# Patient Record
Sex: Male | Born: 1940 | Race: White | Hispanic: No | Marital: Married | Smoking: Never smoker
Health system: Southern US, Community
[De-identification: ages and names within clinical notes are randomized; demographics above are authoritative.]

## PROBLEM LIST (undated history)

## (undated) DIAGNOSIS — I4891 Unspecified atrial fibrillation: Secondary | ICD-10-CM

## (undated) DIAGNOSIS — S46219A Strain of muscle, fascia and tendon of other parts of biceps, unspecified arm, initial encounter: Secondary | ICD-10-CM

## (undated) DIAGNOSIS — I2510 Atherosclerotic heart disease of native coronary artery without angina pectoris: Secondary | ICD-10-CM

## (undated) DIAGNOSIS — F419 Anxiety disorder, unspecified: Secondary | ICD-10-CM

## (undated) DIAGNOSIS — I10 Essential (primary) hypertension: Secondary | ICD-10-CM

## (undated) DIAGNOSIS — Z951 Presence of aortocoronary bypass graft: Secondary | ICD-10-CM

## (undated) HISTORY — DX: Essential (primary) hypertension: I10

## (undated) HISTORY — PX: COLON SURGERY: SHX602

## (undated) HISTORY — DX: Atherosclerotic heart disease of native coronary artery without angina pectoris: I25.10

## (undated) HISTORY — PX: EYE SURGERY: SHX253

## (undated) HISTORY — DX: Anxiety disorder, unspecified: F41.9

## (undated) HISTORY — DX: Presence of aortocoronary bypass graft: Z95.1

---

## 2003-09-11 ENCOUNTER — Emergency Department (HOSPITAL_COMMUNITY): Admission: EM | Admit: 2003-09-11 | Discharge: 2003-09-11 | Payer: Self-pay | Admitting: Family Medicine

## 2004-07-26 ENCOUNTER — Ambulatory Visit (HOSPITAL_COMMUNITY): Admission: RE | Admit: 2004-07-26 | Discharge: 2004-07-27 | Payer: Self-pay | Admitting: Ophthalmology

## 2004-12-27 ENCOUNTER — Ambulatory Visit (HOSPITAL_COMMUNITY): Admission: RE | Admit: 2004-12-27 | Discharge: 2004-12-27 | Payer: Self-pay | Admitting: Gastroenterology

## 2010-08-24 ENCOUNTER — Other Ambulatory Visit: Payer: Self-pay | Admitting: Dermatology

## 2011-03-01 ENCOUNTER — Ambulatory Visit
Admission: RE | Admit: 2011-03-01 | Discharge: 2011-03-01 | Disposition: A | Discharge status: Home / Self Care | Payer: Medicare Other | Source: Ambulatory Visit | Attending: Cardiovascular Disease | Admitting: Cardiovascular Disease

## 2011-03-01 ENCOUNTER — Other Ambulatory Visit: Payer: Self-pay | Admitting: Cardiovascular Disease

## 2011-03-01 DIAGNOSIS — Z01811 Encounter for preprocedural respiratory examination: Secondary | ICD-10-CM

## 2011-03-02 ENCOUNTER — Ambulatory Visit (HOSPITAL_COMMUNITY)
Admission: RE | Admit: 2011-03-02 | Discharge: 2011-03-03 | Disposition: A | Discharge status: Home / Self Care | Payer: Medicare Other | Source: Ambulatory Visit | Attending: Cardiovascular Disease | Admitting: Cardiovascular Disease

## 2011-03-02 ENCOUNTER — Encounter (HOSPITAL_COMMUNITY): Payer: Medicare Other

## 2011-03-02 HISTORY — PX: CARDIAC CATHETERIZATION: SHX172

## 2011-03-02 LAB — CBC
HCT: 42.7 % (ref 39.0–52.0)
Hemoglobin: 15 g/dL (ref 13.0–17.0)
MCH: 30.2 pg (ref 26.0–34.0)
MCHC: 35.1 g/dL (ref 30.0–36.0)
RBC: 4.96 MIL/uL (ref 4.22–5.81)

## 2011-03-03 ENCOUNTER — Ambulatory Visit (HOSPITAL_COMMUNITY): Payer: Medicare Other

## 2011-03-03 ENCOUNTER — Other Ambulatory Visit (HOSPITAL_COMMUNITY): Payer: Medicare Other

## 2011-03-03 DIAGNOSIS — Z01810 Encounter for preprocedural cardiovascular examination: Secondary | ICD-10-CM

## 2011-03-03 DIAGNOSIS — I2510 Atherosclerotic heart disease of native coronary artery without angina pectoris: Secondary | ICD-10-CM

## 2011-03-03 HISTORY — PX: OTHER SURGICAL HISTORY: SHX169

## 2011-03-03 LAB — BLOOD GAS, ARTERIAL
Acid-Base Excess: 1.5 mmol/L (ref 0.0–2.0)
Bicarbonate: 25.1 mEq/L — ABNORMAL HIGH (ref 20.0–24.0)
Drawn by: 344381
FIO2: 0.21 %
O2 Saturation: 97.8 %
Patient temperature: 98.6
TCO2: 26.2 mmol/L (ref 0–100)
pCO2 arterial: 36.6 mmHg (ref 35.0–45.0)
pH, Arterial: 7.451 — ABNORMAL HIGH (ref 7.350–7.450)
pO2, Arterial: 90.4 mmHg (ref 80.0–100.0)

## 2011-03-03 LAB — TYPE AND SCREEN
ABO/RH(D): A POS
Antibody Screen: NEGATIVE

## 2011-03-03 LAB — SURGICAL PCR SCREEN
MRSA, PCR: NEGATIVE
Staphylococcus aureus: NEGATIVE

## 2011-03-03 LAB — COMPREHENSIVE METABOLIC PANEL
ALT: 21 U/L (ref 0–53)
AST: 18 U/L (ref 0–37)
Albumin: 3.5 g/dL (ref 3.5–5.2)
Alkaline Phosphatase: 45 U/L (ref 39–117)
BUN: 14 mg/dL (ref 6–23)
CO2: 24 mEq/L (ref 19–32)
Calcium: 9.2 mg/dL (ref 8.4–10.5)
Chloride: 102 mEq/L (ref 96–112)
Creatinine, Ser: 0.86 mg/dL (ref 0.50–1.35)
GFR calc Af Amer: >60 mL/min (ref >60)
GFR calc non Af Amer: >60 mL/min (ref >60)
Glucose, Bld: 152 mg/dL — ABNORMAL HIGH (ref 70–99)
Potassium: 3.9 mEq/L (ref 3.5–5.1)
Sodium: 136 mEq/L (ref 135–145)
Total Bilirubin: 0.7 mg/dL (ref 0.3–1.2)
Total Protein: 6.6 g/dL (ref 6.0–8.3)

## 2011-03-03 LAB — URINALYSIS, ROUTINE W REFLEX MICROSCOPIC
Bilirubin Urine: NEGATIVE
Glucose, UA: NEGATIVE mg/dL
Hgb urine dipstick: NEGATIVE
Ketones, ur: NEGATIVE mg/dL
Leukocytes, UA: NEGATIVE
Nitrite: NEGATIVE
Protein, ur: NEGATIVE mg/dL
Specific Gravity, Urine: 1.02 (ref 1.005–1.030)
Urobilinogen, UA: 0.2 mg/dL (ref 0.0–1.0)
pH: 5.5 (ref 5.0–8.0)

## 2011-03-03 LAB — ABO/RH: ABO/RH(D): A POS

## 2011-03-03 LAB — APTT: aPTT: 33 seconds (ref 24–37)

## 2011-03-03 LAB — PROTIME-INR
INR: 1.09 (ref 0.00–1.49)
Prothrombin Time: 14.3 seconds (ref 11.6–15.2)

## 2011-03-04 LAB — HEMOGLOBIN A1C
Hgb A1c MFr Bld: 6.3 % — ABNORMAL HIGH (ref <5.7)
Mean Plasma Glucose: 134 mg/dL — ABNORMAL HIGH (ref <117)

## 2011-03-07 ENCOUNTER — Inpatient Hospital Stay (HOSPITAL_COMMUNITY)
Admission: RE | Admit: 2011-03-07 | Discharge: 2011-03-16 | DRG: 236 | Disposition: A | Discharge status: Home Health | Payer: Medicare Other | Source: Ambulatory Visit | Attending: Cardiothoracic Surgery | Admitting: Cardiothoracic Surgery

## 2011-03-07 ENCOUNTER — Inpatient Hospital Stay (HOSPITAL_COMMUNITY): Payer: Medicare Other

## 2011-03-07 DIAGNOSIS — I10 Essential (primary) hypertension: Secondary | ICD-10-CM | POA: Diagnosis present

## 2011-03-07 DIAGNOSIS — Y832 Surgical operation with anastomosis, bypass or graft as the cause of abnormal reaction of the patient, or of later complication, without mention of misadventure at the time of the procedure: Secondary | ICD-10-CM | POA: Diagnosis not present

## 2011-03-07 DIAGNOSIS — I4891 Unspecified atrial fibrillation: Secondary | ICD-10-CM | POA: Diagnosis not present

## 2011-03-07 DIAGNOSIS — D62 Acute posthemorrhagic anemia: Secondary | ICD-10-CM | POA: Diagnosis not present

## 2011-03-07 DIAGNOSIS — R110 Nausea: Secondary | ICD-10-CM | POA: Diagnosis not present

## 2011-03-07 DIAGNOSIS — I2510 Atherosclerotic heart disease of native coronary artery without angina pectoris: Principal | ICD-10-CM | POA: Diagnosis present

## 2011-03-07 DIAGNOSIS — D126 Benign neoplasm of colon, unspecified: Secondary | ICD-10-CM | POA: Diagnosis present

## 2011-03-07 DIAGNOSIS — Z79899 Other long term (current) drug therapy: Secondary | ICD-10-CM

## 2011-03-07 DIAGNOSIS — Z7901 Long term (current) use of anticoagulants: Secondary | ICD-10-CM

## 2011-03-07 DIAGNOSIS — Z8249 Family history of ischemic heart disease and other diseases of the circulatory system: Secondary | ICD-10-CM

## 2011-03-07 DIAGNOSIS — I519 Heart disease, unspecified: Secondary | ICD-10-CM | POA: Diagnosis not present

## 2011-03-07 DIAGNOSIS — I2582 Chronic total occlusion of coronary artery: Secondary | ICD-10-CM | POA: Diagnosis present

## 2011-03-07 DIAGNOSIS — I209 Angina pectoris, unspecified: Secondary | ICD-10-CM | POA: Diagnosis present

## 2011-03-07 DIAGNOSIS — E669 Obesity, unspecified: Secondary | ICD-10-CM | POA: Diagnosis present

## 2011-03-07 DIAGNOSIS — E8779 Other fluid overload: Secondary | ICD-10-CM | POA: Diagnosis not present

## 2011-03-07 DIAGNOSIS — F411 Generalized anxiety disorder: Secondary | ICD-10-CM | POA: Diagnosis present

## 2011-03-07 DIAGNOSIS — S7010XA Contusion of unspecified thigh, initial encounter: Secondary | ICD-10-CM | POA: Diagnosis not present

## 2011-03-07 DIAGNOSIS — M109 Gout, unspecified: Secondary | ICD-10-CM | POA: Diagnosis present

## 2011-03-07 DIAGNOSIS — T462X5A Adverse effect of other antidysrhythmic drugs, initial encounter: Secondary | ICD-10-CM | POA: Diagnosis not present

## 2011-03-07 DIAGNOSIS — D72829 Elevated white blood cell count, unspecified: Secondary | ICD-10-CM | POA: Diagnosis not present

## 2011-03-07 DIAGNOSIS — E876 Hypokalemia: Secondary | ICD-10-CM | POA: Diagnosis not present

## 2011-03-07 DIAGNOSIS — Z7982 Long term (current) use of aspirin: Secondary | ICD-10-CM

## 2011-03-07 DIAGNOSIS — Z951 Presence of aortocoronary bypass graft: Secondary | ICD-10-CM

## 2011-03-07 DIAGNOSIS — X58XXXA Exposure to other specified factors, initial encounter: Secondary | ICD-10-CM | POA: Diagnosis not present

## 2011-03-07 DIAGNOSIS — E871 Hypo-osmolality and hyponatremia: Secondary | ICD-10-CM | POA: Diagnosis not present

## 2011-03-07 DIAGNOSIS — Y9210 Unspecified residential institution as the place of occurrence of the external cause: Secondary | ICD-10-CM | POA: Diagnosis not present

## 2011-03-07 HISTORY — DX: Presence of aortocoronary bypass graft: Z95.1

## 2011-03-07 HISTORY — PX: CORONARY ARTERY BYPASS GRAFT: SHX141

## 2011-03-07 LAB — POCT I-STAT 3, VENOUS BLOOD GAS (G3P V)
Bicarbonate: 24.1 mEq/L — ABNORMAL HIGH (ref 20.0–24.0)
O2 Saturation: 84 %
TCO2: 25 mmol/L (ref 0–100)
pCO2, Ven: 37.8 mmHg — ABNORMAL LOW (ref 45.0–50.0)
pH, Ven: 7.413 — ABNORMAL HIGH (ref 7.250–7.300)
pO2, Ven: 48 mmHg — ABNORMAL HIGH (ref 30.0–45.0)

## 2011-03-07 LAB — POCT I-STAT 4, (NA,K, GLUC, HGB,HCT)
Glucose, Bld: 110 mg/dL — ABNORMAL HIGH (ref 70–99)
Glucose, Bld: 142 mg/dL — ABNORMAL HIGH (ref 70–99)
Glucose, Bld: 118 mg/dL — ABNORMAL HIGH (ref 70–99)
Glucose, Bld: 129 mg/dL — ABNORMAL HIGH (ref 70–99)
Glucose, Bld: 130 mg/dL — ABNORMAL HIGH (ref 70–99)
Glucose, Bld: 138 mg/dL — ABNORMAL HIGH (ref 70–99)
Glucose, Bld: 118 mg/dL — ABNORMAL HIGH (ref 70–99)
HCT: 39 % (ref 39.0–52.0)
HCT: 39 % (ref 39.0–52.0)
HCT: 30 % — ABNORMAL LOW (ref 39.0–52.0)
HCT: 28 % — ABNORMAL LOW (ref 39.0–52.0)
HCT: 32 % — ABNORMAL LOW (ref 39.0–52.0)
HCT: 29 % — ABNORMAL LOW (ref 39.0–52.0)
HCT: 31 % — ABNORMAL LOW (ref 39.0–52.0)
Hemoglobin: 13.3 g/dL (ref 13.0–17.0)
Hemoglobin: 13.3 g/dL (ref 13.0–17.0)
Hemoglobin: 10.2 g/dL — ABNORMAL LOW (ref 13.0–17.0)
Hemoglobin: 10.9 g/dL — ABNORMAL LOW (ref 13.0–17.0)
Hemoglobin: 9.5 g/dL — ABNORMAL LOW (ref 13.0–17.0)
Hemoglobin: 9.9 g/dL — ABNORMAL LOW (ref 13.0–17.0)
Hemoglobin: 10.5 g/dL — ABNORMAL LOW (ref 13.0–17.0)
Potassium: 3.4 mEq/L — ABNORMAL LOW (ref 3.5–5.1)
Potassium: 3.3 mEq/L — ABNORMAL LOW (ref 3.5–5.1)
Potassium: 3.3 mEq/L — ABNORMAL LOW (ref 3.5–5.1)
Potassium: 4.1 mEq/L (ref 3.5–5.1)
Potassium: 4.3 mEq/L (ref 3.5–5.1)
Potassium: 3.6 mEq/L (ref 3.5–5.1)
Potassium: 3.3 mEq/L — ABNORMAL LOW (ref 3.5–5.1)
Sodium: 140 mEq/L (ref 135–145)
Sodium: 140 mEq/L (ref 135–145)
Sodium: 135 mEq/L (ref 135–145)
Sodium: 138 mEq/L (ref 135–145)
Sodium: 139 mEq/L (ref 135–145)
Sodium: 137 mEq/L (ref 135–145)
Sodium: 141 mEq/L (ref 135–145)

## 2011-03-07 LAB — POCT I-STAT 3, ART BLOOD GAS (G3+)
Acid-Base Excess: 4 mmol/L — ABNORMAL HIGH (ref 0.0–2.0)
Acid-Base Excess: 2 mmol/L (ref 0.0–2.0)
Acid-Base Excess: 1 mmol/L (ref 0.0–2.0)
Acid-base deficit: 1 mmol/L (ref 0.0–2.0)
Bicarbonate: 28.5 mEq/L — ABNORMAL HIGH (ref 20.0–24.0)
Bicarbonate: 25.8 mEq/L — ABNORMAL HIGH (ref 20.0–24.0)
Bicarbonate: 25 mEq/L — ABNORMAL HIGH (ref 20.0–24.0)
Bicarbonate: 23.5 mEq/L (ref 20.0–24.0)
O2 Saturation: 100 %
O2 Saturation: 100 %
O2 Saturation: 100 %
O2 Saturation: 96 %
Patient temperature: 35.5
TCO2: 30 mmol/L (ref 0–100)
TCO2: 27 mmol/L (ref 0–100)
TCO2: 26 mmol/L (ref 0–100)
TCO2: 25 mmol/L (ref 0–100)
pCO2 arterial: 39.4 mmHg (ref 35.0–45.0)
pCO2 arterial: 36.1 mmHg (ref 35.0–45.0)
pCO2 arterial: 37.9 mmHg (ref 35.0–45.0)
pCO2 arterial: 34.7 mmHg — ABNORMAL LOW (ref 35.0–45.0)
pH, Arterial: 7.468 — ABNORMAL HIGH (ref 7.350–7.450)
pH, Arterial: 7.461 — ABNORMAL HIGH (ref 7.350–7.450)
pH, Arterial: 7.427 (ref 7.350–7.450)
pH, Arterial: 7.432 (ref 7.350–7.450)
pO2, Arterial: 479 mmHg — ABNORMAL HIGH (ref 80.0–100.0)
pO2, Arterial: 395 mmHg — ABNORMAL HIGH (ref 80.0–100.0)
pO2, Arterial: 297 mmHg — ABNORMAL HIGH (ref 80.0–100.0)
pO2, Arterial: 71 mmHg — ABNORMAL LOW (ref 80.0–100.0)

## 2011-03-07 LAB — CBC
HCT: 29.4 % — ABNORMAL LOW (ref 39.0–52.0)
HCT: 31.2 % — ABNORMAL LOW (ref 39.0–52.0)
Hemoglobin: 10 g/dL — ABNORMAL LOW (ref 13.0–17.0)
Hemoglobin: 10.7 g/dL — ABNORMAL LOW (ref 13.0–17.0)
MCH: 29.2 pg (ref 26.0–34.0)
MCH: 29.5 pg (ref 26.0–34.0)
MCHC: 34 g/dL (ref 30.0–36.0)
MCHC: 34.3 g/dL (ref 30.0–36.0)
MCV: 86 fL (ref 78.0–100.0)
MCV: 86 fL (ref 78.0–100.0)
Platelets: 141 10*3/uL — ABNORMAL LOW (ref 150–400)
Platelets: 147 10*3/uL — ABNORMAL LOW (ref 150–400)
RBC: 3.42 MIL/uL — ABNORMAL LOW (ref 4.22–5.81)
RBC: 3.63 MIL/uL — ABNORMAL LOW (ref 4.22–5.81)
RDW: 13.4 % (ref 11.5–15.5)
RDW: 13.5 % (ref 11.5–15.5)
WBC: 16.3 10*3/uL — ABNORMAL HIGH (ref 4.0–10.5)
WBC: 14.7 10*3/uL — ABNORMAL HIGH (ref 4.0–10.5)

## 2011-03-07 LAB — PROTIME-INR
INR: 1.64 — ABNORMAL HIGH (ref 0.00–1.49)
Prothrombin Time: 19.7 seconds — ABNORMAL HIGH (ref 11.6–15.2)

## 2011-03-07 LAB — GLUCOSE, CAPILLARY
Glucose-Capillary: 123 mg/dL — ABNORMAL HIGH (ref 70–99)
Glucose-Capillary: 127 mg/dL — ABNORMAL HIGH (ref 70–99)
Glucose-Capillary: 128 mg/dL — ABNORMAL HIGH (ref 70–99)
Glucose-Capillary: 130 mg/dL — ABNORMAL HIGH (ref 70–99)
Glucose-Capillary: 136 mg/dL — ABNORMAL HIGH (ref 70–99)
Glucose-Capillary: 136 mg/dL — ABNORMAL HIGH (ref 70–99)
Glucose-Capillary: 137 mg/dL — ABNORMAL HIGH (ref 70–99)
Glucose-Capillary: 139 mg/dL — ABNORMAL HIGH (ref 70–99)
Glucose-Capillary: 140 mg/dL — ABNORMAL HIGH (ref 70–99)
Glucose-Capillary: 151 mg/dL — ABNORMAL HIGH (ref 70–99)

## 2011-03-07 LAB — APTT: aPTT: 37 seconds (ref 24–37)

## 2011-03-07 LAB — POCT I-STAT GLUCOSE
Glucose, Bld: 139 mg/dL — ABNORMAL HIGH (ref 70–99)
Glucose, Bld: 123 mg/dL — ABNORMAL HIGH (ref 70–99)
Operator id: 212621
Operator id: 3342

## 2011-03-07 LAB — HEMOGLOBIN AND HEMATOCRIT, BLOOD
HCT: 30.9 % — ABNORMAL LOW (ref 39.0–52.0)
Hemoglobin: 10.6 g/dL — ABNORMAL LOW (ref 13.0–17.0)

## 2011-03-07 LAB — MAGNESIUM: Magnesium: 2.9 mg/dL — ABNORMAL HIGH (ref 1.5–2.5)

## 2011-03-07 LAB — PLATELET COUNT: Platelets: 172 10*3/uL (ref 150–400)

## 2011-03-07 LAB — CREATININE, SERUM
Creatinine, Ser: 0.71 mg/dL (ref 0.50–1.35)
GFR calc Af Amer: >60 mL/min (ref >60)
GFR calc non Af Amer: >60 mL/min (ref >60)

## 2011-03-08 ENCOUNTER — Inpatient Hospital Stay (HOSPITAL_COMMUNITY): Payer: Medicare Other

## 2011-03-08 LAB — POCT I-STAT, CHEM 8
BUN: 9 mg/dL (ref 6–23)
BUN: 14 mg/dL (ref 6–23)
Calcium, Ion: 1.16 mmol/L (ref 1.12–1.32)
Chloride: 103 mEq/L (ref 96–112)
Creatinine, Ser: 1 mg/dL (ref 0.50–1.35)
Glucose, Bld: 147 mg/dL — ABNORMAL HIGH (ref 70–99)
HCT: 31 % — ABNORMAL LOW (ref 39.0–52.0)
Hemoglobin: 10.2 g/dL — ABNORMAL LOW (ref 13.0–17.0)
Hemoglobin: 10.5 g/dL — ABNORMAL LOW (ref 13.0–17.0)
Potassium: 3.5 mEq/L (ref 3.5–5.1)
Sodium: 141 mEq/L (ref 135–145)
Sodium: 139 mEq/L (ref 135–145)
TCO2: 23 mmol/L (ref 0–100)
TCO2: 23 mmol/L (ref 0–100)

## 2011-03-08 LAB — CREATININE, SERUM
Creatinine, Ser: 1.03 mg/dL (ref 0.50–1.35)
GFR calc Af Amer: >60 mL/min (ref >60)
GFR calc non Af Amer: >60 mL/min (ref >60)

## 2011-03-08 LAB — CBC
HCT: 29.6 % — ABNORMAL LOW (ref 39.0–52.0)
HCT: 31 % — ABNORMAL LOW (ref 39.0–52.0)
Hemoglobin: 10.1 g/dL — ABNORMAL LOW (ref 13.0–17.0)
Hemoglobin: 10.4 g/dL — ABNORMAL LOW (ref 13.0–17.0)
MCH: 29.4 pg (ref 26.0–34.0)
MCH: 29.4 pg (ref 26.0–34.0)
MCHC: 34.1 g/dL (ref 30.0–36.0)
MCHC: 33.5 g/dL (ref 30.0–36.0)
MCV: 86.3 fL (ref 78.0–100.0)
MCV: 87.6 fL (ref 78.0–100.0)
Platelets: 143 10*3/uL — ABNORMAL LOW (ref 150–400)
Platelets: 171 10*3/uL (ref 150–400)
RBC: 3.43 MIL/uL — ABNORMAL LOW (ref 4.22–5.81)
RBC: 3.54 MIL/uL — ABNORMAL LOW (ref 4.22–5.81)
RDW: 13.6 % (ref 11.5–15.5)
RDW: 14 % (ref 11.5–15.5)
WBC: 13.7 10*3/uL — ABNORMAL HIGH (ref 4.0–10.5)
WBC: 20.5 10*3/uL — ABNORMAL HIGH (ref 4.0–10.5)

## 2011-03-08 LAB — POCT I-STAT 3, ART BLOOD GAS (G3+)
Bicarbonate: 23.5 mEq/L (ref 20.0–24.0)
Patient temperature: 97.7
TCO2: 25 mmol/L (ref 0–100)
pCO2 arterial: 38.2 mmHg (ref 35.0–45.0)
pH, Arterial: 7.399 (ref 7.350–7.450)
pH, Arterial: 7.371 (ref 7.350–7.450)

## 2011-03-08 LAB — GLUCOSE, CAPILLARY
Glucose-Capillary: 110 mg/dL — ABNORMAL HIGH (ref 70–99)
Glucose-Capillary: 103 mg/dL — ABNORMAL HIGH (ref 70–99)
Glucose-Capillary: 101 mg/dL — ABNORMAL HIGH (ref 70–99)
Glucose-Capillary: 105 mg/dL — ABNORMAL HIGH (ref 70–99)
Glucose-Capillary: 100 mg/dL — ABNORMAL HIGH (ref 70–99)
Glucose-Capillary: 94 mg/dL (ref 70–99)
Glucose-Capillary: 93 mg/dL (ref 70–99)
Glucose-Capillary: 131 mg/dL — ABNORMAL HIGH (ref 70–99)
Glucose-Capillary: 154 mg/dL — ABNORMAL HIGH (ref 70–99)
Glucose-Capillary: 194 mg/dL — ABNORMAL HIGH (ref 70–99)

## 2011-03-08 LAB — BASIC METABOLIC PANEL
BUN: 11 mg/dL (ref 6–23)
CO2: 25 mEq/L (ref 19–32)
Calcium: 7.7 mg/dL — ABNORMAL LOW (ref 8.4–10.5)
Chloride: 109 mEq/L (ref 96–112)
Creatinine, Ser: 0.68 mg/dL (ref 0.50–1.35)
GFR calc Af Amer: >60 mL/min (ref >60)
GFR calc non Af Amer: >60 mL/min (ref >60)
Glucose, Bld: 104 mg/dL — ABNORMAL HIGH (ref 70–99)
Potassium: 3.4 mEq/L — ABNORMAL LOW (ref 3.5–5.1)
Sodium: 140 mEq/L (ref 135–145)

## 2011-03-08 LAB — MAGNESIUM
Magnesium: 2.6 mg/dL — ABNORMAL HIGH (ref 1.5–2.5)
Magnesium: 2.5 mg/dL (ref 1.5–2.5)

## 2011-03-09 ENCOUNTER — Inpatient Hospital Stay (HOSPITAL_COMMUNITY): Payer: Medicare Other

## 2011-03-09 DIAGNOSIS — E1165 Type 2 diabetes mellitus with hyperglycemia: Secondary | ICD-10-CM

## 2011-03-09 DIAGNOSIS — IMO0001 Reserved for inherently not codable concepts without codable children: Secondary | ICD-10-CM

## 2011-03-09 LAB — CBC
HCT: 30.4 % — ABNORMAL LOW (ref 39.0–52.0)
Hemoglobin: 10.4 g/dL — ABNORMAL LOW (ref 13.0–17.0)
MCH: 30 pg (ref 26.0–34.0)
MCHC: 34.2 g/dL (ref 30.0–36.0)
MCV: 87.6 fL (ref 78.0–100.0)
Platelets: 179 10*3/uL (ref 150–400)
RBC: 3.47 MIL/uL — ABNORMAL LOW (ref 4.22–5.81)
RDW: 14.3 % (ref 11.5–15.5)
WBC: 21.8 10*3/uL — ABNORMAL HIGH (ref 4.0–10.5)

## 2011-03-09 LAB — ALBUMIN: Albumin: 2.5 g/dL — ABNORMAL LOW (ref 3.5–5.2)

## 2011-03-09 LAB — BASIC METABOLIC PANEL
BUN: 19 mg/dL (ref 6–23)
CO2: 23 mEq/L (ref 19–32)
Calcium: 7.9 mg/dL — ABNORMAL LOW (ref 8.4–10.5)
Chloride: 102 mEq/L (ref 96–112)
Creatinine, Ser: 1.15 mg/dL (ref 0.50–1.35)
GFR calc Af Amer: >60 mL/min (ref >60)
GFR calc non Af Amer: >60 mL/min (ref >60)
Glucose, Bld: 208 mg/dL — ABNORMAL HIGH (ref 70–99)
Potassium: 3.7 mEq/L (ref 3.5–5.1)
Sodium: 134 mEq/L — ABNORMAL LOW (ref 135–145)

## 2011-03-09 LAB — URINE MICROSCOPIC-ADD ON

## 2011-03-09 LAB — URINALYSIS, ROUTINE W REFLEX MICROSCOPIC
Glucose, UA: NEGATIVE mg/dL
Hgb urine dipstick: NEGATIVE
Ketones, ur: 15 mg/dL — AB
Nitrite: POSITIVE — AB
Protein, ur: NEGATIVE mg/dL
Specific Gravity, Urine: 1.03 (ref 1.005–1.030)
Urobilinogen, UA: 1 mg/dL (ref 0.0–1.0)
pH: 5.5 (ref 5.0–8.0)

## 2011-03-09 LAB — GLUCOSE, CAPILLARY
Glucose-Capillary: 121 mg/dL — ABNORMAL HIGH (ref 70–99)
Glucose-Capillary: 139 mg/dL — ABNORMAL HIGH (ref 70–99)
Glucose-Capillary: 158 mg/dL — ABNORMAL HIGH (ref 70–99)
Glucose-Capillary: 176 mg/dL — ABNORMAL HIGH (ref 70–99)
Glucose-Capillary: 183 mg/dL — ABNORMAL HIGH (ref 70–99)
Glucose-Capillary: 193 mg/dL — ABNORMAL HIGH (ref 70–99)

## 2011-03-09 LAB — PHOSPHORUS: Phosphorus: 2.5 mg/dL (ref 2.3–4.6)

## 2011-03-09 LAB — MAGNESIUM: Magnesium: 2.4 mg/dL (ref 1.5–2.5)

## 2011-03-10 ENCOUNTER — Inpatient Hospital Stay (HOSPITAL_COMMUNITY): Payer: Medicare Other

## 2011-03-10 LAB — URINE CULTURE
Colony Count: NO GROWTH
Culture  Setup Time: 201209061130
Culture: NO GROWTH

## 2011-03-10 LAB — BASIC METABOLIC PANEL
BUN: 29 mg/dL — ABNORMAL HIGH (ref 6–23)
CO2: 25 mEq/L (ref 19–32)
Calcium: 8.3 mg/dL — ABNORMAL LOW (ref 8.4–10.5)
Chloride: 96 mEq/L (ref 96–112)
Creatinine, Ser: 1.32 mg/dL (ref 0.50–1.35)
GFR calc Af Amer: >60 mL/min (ref >60)
GFR calc non Af Amer: 53 mL/min — ABNORMAL LOW (ref >60)
Glucose, Bld: 147 mg/dL — ABNORMAL HIGH (ref 70–99)
Potassium: 4.2 mEq/L (ref 3.5–5.1)
Sodium: 128 mEq/L — ABNORMAL LOW (ref 135–145)

## 2011-03-10 LAB — CBC
HCT: 31.4 % — ABNORMAL LOW (ref 39.0–52.0)
Hemoglobin: 10.6 g/dL — ABNORMAL LOW (ref 13.0–17.0)
MCH: 29.3 pg (ref 26.0–34.0)
MCHC: 33.8 g/dL (ref 30.0–36.0)
MCV: 86.7 fL (ref 78.0–100.0)
Platelets: 179 10*3/uL (ref 150–400)
RBC: 3.62 MIL/uL — ABNORMAL LOW (ref 4.22–5.81)
RDW: 14.1 % (ref 11.5–15.5)
WBC: 18.5 10*3/uL — ABNORMAL HIGH (ref 4.0–10.5)

## 2011-03-10 LAB — PROTIME-INR
INR: 1.17 (ref 0.00–1.49)
Prothrombin Time: 15.1 seconds (ref 11.6–15.2)

## 2011-03-10 LAB — GLUCOSE, CAPILLARY
Glucose-Capillary: 127 mg/dL — ABNORMAL HIGH (ref 70–99)
Glucose-Capillary: 137 mg/dL — ABNORMAL HIGH (ref 70–99)

## 2011-03-11 LAB — GLUCOSE, CAPILLARY
Glucose-Capillary: 131 mg/dL — ABNORMAL HIGH (ref 70–99)
Glucose-Capillary: 139 mg/dL — ABNORMAL HIGH (ref 70–99)
Glucose-Capillary: 115 mg/dL — ABNORMAL HIGH (ref 70–99)
Glucose-Capillary: 115 mg/dL — ABNORMAL HIGH (ref 70–99)
Glucose-Capillary: 139 mg/dL — ABNORMAL HIGH (ref 70–99)
Glucose-Capillary: 118 mg/dL — ABNORMAL HIGH (ref 70–99)

## 2011-03-11 LAB — CBC
HCT: 34 % — ABNORMAL LOW (ref 39.0–52.0)
Hemoglobin: 11.5 g/dL — ABNORMAL LOW (ref 13.0–17.0)
MCH: 29.5 pg (ref 26.0–34.0)
MCHC: 33.8 g/dL (ref 30.0–36.0)
MCV: 87.2 fL (ref 78.0–100.0)
Platelets: 233 10*3/uL (ref 150–400)
RBC: 3.9 MIL/uL — ABNORMAL LOW (ref 4.22–5.81)
RDW: 14.2 % (ref 11.5–15.5)
WBC: 16.1 10*3/uL — ABNORMAL HIGH (ref 4.0–10.5)

## 2011-03-11 LAB — HEPARIN LEVEL (UNFRACTIONATED): Heparin Unfractionated: 0.16 IU/mL — ABNORMAL LOW (ref 0.30–0.70)

## 2011-03-11 LAB — PROTIME-INR
INR: 1.12 (ref 0.00–1.49)
Prothrombin Time: 14.6 seconds (ref 11.6–15.2)

## 2011-03-12 LAB — CBC
HCT: 28.2 % — ABNORMAL LOW (ref 39.0–52.0)
Hemoglobin: 9.6 g/dL — ABNORMAL LOW (ref 13.0–17.0)
MCH: 29.4 pg (ref 26.0–34.0)
MCHC: 34 g/dL (ref 30.0–36.0)
MCV: 86.5 fL (ref 78.0–100.0)
Platelets: 222 10*3/uL (ref 150–400)
RBC: 3.26 MIL/uL — ABNORMAL LOW (ref 4.22–5.81)
RDW: 14.1 % (ref 11.5–15.5)
WBC: 15.2 10*3/uL — ABNORMAL HIGH (ref 4.0–10.5)

## 2011-03-12 LAB — PROTIME-INR
INR: 1.21 (ref 0.00–1.49)
Prothrombin Time: 15.6 seconds — ABNORMAL HIGH (ref 11.6–15.2)

## 2011-03-12 LAB — GLUCOSE, CAPILLARY
Glucose-Capillary: 120 mg/dL — ABNORMAL HIGH (ref 70–99)
Glucose-Capillary: 109 mg/dL — ABNORMAL HIGH (ref 70–99)
Glucose-Capillary: 115 mg/dL — ABNORMAL HIGH (ref 70–99)

## 2011-03-12 LAB — HEPARIN LEVEL (UNFRACTIONATED): Heparin Unfractionated: 0.37 IU/mL (ref 0.30–0.70)

## 2011-03-13 LAB — BASIC METABOLIC PANEL
BUN: 39 mg/dL — ABNORMAL HIGH (ref 6–23)
CO2: 24 mEq/L (ref 19–32)
Calcium: 8.5 mg/dL (ref 8.4–10.5)
Chloride: 104 mEq/L (ref 96–112)
Creatinine, Ser: 1.37 mg/dL — ABNORMAL HIGH (ref 0.50–1.35)
GFR calc Af Amer: >60 mL/min (ref >60)
GFR calc non Af Amer: 51 mL/min — ABNORMAL LOW (ref >60)
Glucose, Bld: 123 mg/dL — ABNORMAL HIGH (ref 70–99)
Potassium: 4.9 mEq/L (ref 3.5–5.1)
Sodium: 137 mEq/L (ref 135–145)

## 2011-03-13 LAB — GLUCOSE, CAPILLARY
Glucose-Capillary: 122 mg/dL — ABNORMAL HIGH (ref 70–99)
Glucose-Capillary: 105 mg/dL — ABNORMAL HIGH (ref 70–99)
Glucose-Capillary: 143 mg/dL — ABNORMAL HIGH (ref 70–99)
Glucose-Capillary: 105 mg/dL — ABNORMAL HIGH (ref 70–99)
Glucose-Capillary: 118 mg/dL — ABNORMAL HIGH (ref 70–99)

## 2011-03-13 LAB — PROTIME-INR
INR: 1.22 (ref 0.00–1.49)
Prothrombin Time: 15.7 seconds — ABNORMAL HIGH (ref 11.6–15.2)

## 2011-03-13 LAB — HEPARIN LEVEL (UNFRACTIONATED): Heparin Unfractionated: 0.44 IU/mL (ref 0.30–0.70)

## 2011-03-14 ENCOUNTER — Inpatient Hospital Stay (HOSPITAL_COMMUNITY): Payer: Medicare Other

## 2011-03-14 LAB — GLUCOSE, CAPILLARY
Glucose-Capillary: 98 mg/dL (ref 70–99)
Glucose-Capillary: 98 mg/dL (ref 70–99)
Glucose-Capillary: 133 mg/dL — ABNORMAL HIGH (ref 70–99)
Glucose-Capillary: 103 mg/dL — ABNORMAL HIGH (ref 70–99)

## 2011-03-14 LAB — APTT: aPTT: 80 seconds — ABNORMAL HIGH (ref 24–37)

## 2011-03-14 LAB — CBC
HCT: 28.3 % — ABNORMAL LOW (ref 39.0–52.0)
Hemoglobin: 9.4 g/dL — ABNORMAL LOW (ref 13.0–17.0)
MCH: 29.5 pg (ref 26.0–34.0)
MCHC: 33.2 g/dL (ref 30.0–36.0)
MCV: 88.7 fL (ref 78.0–100.0)
Platelets: 270 10*3/uL (ref 150–400)
RBC: 3.19 MIL/uL — ABNORMAL LOW (ref 4.22–5.81)
RDW: 14.7 % (ref 11.5–15.5)
WBC: 15.7 10*3/uL — ABNORMAL HIGH (ref 4.0–10.5)

## 2011-03-14 LAB — HEPARIN LEVEL (UNFRACTIONATED): Heparin Unfractionated: 0.49 IU/mL (ref 0.30–0.70)

## 2011-03-14 LAB — PROTIME-INR: INR: 1.26 (ref 0.00–1.49)

## 2011-03-15 LAB — CBC
HCT: 27.9 % — ABNORMAL LOW (ref 39.0–52.0)
Hemoglobin: 9.1 g/dL — ABNORMAL LOW (ref 13.0–17.0)
MCH: 29.2 pg (ref 26.0–34.0)
MCHC: 32.6 g/dL (ref 30.0–36.0)
MCV: 89.4 fL (ref 78.0–100.0)
Platelets: 271 10*3/uL (ref 150–400)
RBC: 3.12 MIL/uL — ABNORMAL LOW (ref 4.22–5.81)
RDW: 15.1 % (ref 11.5–15.5)
WBC: 13.3 10*3/uL — ABNORMAL HIGH (ref 4.0–10.5)

## 2011-03-15 LAB — GLUCOSE, CAPILLARY
Glucose-Capillary: 103 mg/dL — ABNORMAL HIGH (ref 70–99)
Glucose-Capillary: 124 mg/dL — ABNORMAL HIGH (ref 70–99)
Glucose-Capillary: 137 mg/dL — ABNORMAL HIGH (ref 70–99)
Glucose-Capillary: 110 mg/dL — ABNORMAL HIGH (ref 70–99)

## 2011-03-15 LAB — PROTIME-INR
INR: 1.34 (ref 0.00–1.49)
Prothrombin Time: 16.8 seconds — ABNORMAL HIGH (ref 11.6–15.2)

## 2011-03-15 LAB — HEPARIN LEVEL (UNFRACTIONATED): Heparin Unfractionated: 0.3 IU/mL (ref 0.30–0.70)

## 2011-03-15 NOTE — Discharge Summary (Signed)
  NAMEMarland Kitchen  SID, GREENER NO.:  [id number]  MEDICAL RECORD NO.:  [id number]  LOCATION:  6526                         FACILITY:  [facility]  PHYSICIAN:  Nicki Guadalajara, M.D.     DATE OF BIRTH:  [birth year]  DATE OF ADMISSION:  03/02/2011 DATE OF DISCHARGE:  03/03/2011                              DISCHARGE SUMMARY   DISCHARGE DIAGNOSES: 1. Coronary artery disease, significant three-vessel.  He will have     coronary artery bypass grafting on Tuesday, March 07, 2011. 2. Hypertension. 3. Hyperlipidemia. 4. Ischemic cardiomyopathy with an ejection fraction of approximately     45%. 5. History of gout. 6. Bradycardia.  HOSPITAL COURSE:  Mr. Delorenzo is a 70 year old on male with a history of hypertension, hyperlipidemia, gout.  Mr. Ned had a nuclear perfusion scan done on February 24, 2011, which demonstrated high-risk scan perfusion defect involving 26% of the left ventricular myocardium. It is subsequently set up for outpatient left heart catheterization which was completed on March 02, 2011.  This revealed a mild left ventricular dysfunction with mild mid anterolateral and distal inferior hypocontractility.  There was coronary calcification with significant multivessel coronary disease with 40-50% ostial proximal left anterior descending narrowing followed by 90% stenosis proximal to the septal perforating artery and subtotal/total left anterior descending after the septal perforating artery with large mid distal left anterior descending, representing suitable target for coronary artery bypass grafting surgery, 70% followed by 50% OM1 stenosis of the circumflex vessel with 50-60% smooth stenosis and a large OM2 vessel.  There was 50- 60% proximal narrowing of the right coronary artery with total occlusion of the right coronary artery in the region of the acute margin with left- to-right collaterals.  Recommendation was for Surgical consult which was obtained  by Dr. Donata Clay, was subsequently scheduled patient for coronary artery bypass grafting on March 07, 2011, which is Tuesday. Pulmonary function tests were completed which revealed normal pulmonary function.  Also arterial evaluation was completed in the upper and lower extremities, brachial pressures on upper right 147, on the left 158. Carotid Dopplers showed no stenosis, bilateral ICA, and there was antegrade flow, both vertebral arteries bilaterally.    ______________________________ Wilburt Finlay, PA   ______________________________ Nicki Guadalajara, M.D.   BH/MEDQ  D:  03/03/2011  T:  03/03/2011  Job:  [id number]  Electronically Signed by Wilburt Finlay PA on 03/07/2011 10:24:47 AM Electronically Signed by Nicki Guadalajara M.D. on 03/15/2011 12:09:55 PM

## 2011-03-15 NOTE — Discharge Summary (Signed)
  NAME:  Shane Espinoza, Shane Espinoza NO.:  [id number]  MEDICAL RECORD NO.:  [id number]  LOCATION:  6526                         FACILITY:  [facility]  PHYSICIAN:  Nicki Guadalajara, M.D.     DATE OF BIRTH:  July 09, 1940  DATE OF ADMISSION:  03/02/2011 DATE OF DISCHARGE:  03/03/2011                              DISCHARGE SUMMARY   ADDENDUM: The patient has been seen by Dr. Tresa Endo who feels he is stable for discharge home with returning on March 07, 2011 or March 06, 2011 depending on the instructions of Dr. Donata Clay.  DISCHARGE MEDICATIONS: 1. Aspirin 325 mg 1 tablet by mouth daily. 2. Colchicine 0.6 mg 1 tablet by mouth daily. 3. Metoprolol tartrate 25 mg 1 tablet by mouth every 8 hours. 4. Amlodipine 10 mg one-half tablet by mouth daily. 5. Diovan HCT 320/25 mg one-half tablet by mouth daily. 6. Ocuvite multivitamin 1 tablet by mouth daily. 7. Vitamin D3 5000 units 1 tablet daily. 8. Simvastatin 20 mg one-quarter tablet daily at bedtime. 9. Fish oil 1200 mg 1 capsule daily. 10.Imdur XR 60 mg 1 tablet by mouth daily.  DISPOSITION:  Shane Espinoza will be discharged home in stable condition and it was recommend that he rest for the next 3 days.  If his catheter site becomes red, painful, swollen or discharges fluid or pus, he is to call our office immediately.  He will return here on Monday or Tuesday for surgery at the direction of Dr. Donata Clay.  He was also recommended to have low-sodium heart-healthy diet.    ______________________________ Wilburt Finlay, PA   ______________________________ Nicki Guadalajara, M.D.    BH/MEDQ  D:  03/03/2011  T:  03/03/2011  Job:  [id number]  cc:   Nicki Guadalajara, M.D.  Electronically Signed by Wilburt Finlay PA on 03/10/2011 32:44:01 PM Electronically Signed by Nicki Guadalajara M.D. on 03/15/2011 12:10:01 PM

## 2011-03-15 NOTE — Cardiovascular Report (Signed)
NAME:  Shane Espinoza, Shane Espinoza NO.:  [id number]  MEDICAL RECORD NO.:  [id number]  LOCATION:  [facility]                         FACILITY:  [facility]  PHYSICIAN:  Nicki Guadalajara, M.D.     DATE OF BIRTH:  23-Nov-1940  DATE OF PROCEDURE:  03/02/2011 DATE OF DISCHARGE:                           CARDIAC CATHETERIZATION   PROCEDURE:  Left heart catheterization:  Cine coronary angiography; left ventriculography; selective angiography into the left subclavian internal mammary artery; distal aortography.  INDICATIONS:  [name] is a 70 year old gentleman who has a 30-year history of hypertension, as well as history of mild hyperlipidemia.  He has a family history for cardiac disease with mother dying suddenly with cardiac arrest at age 73 and following that massive stroke.  The patient has noticed some irregularity to his heart rhythm. He recently underwent a nuclear perfusion study, which was high risk and demonstrated a perfusion defect involving 26% of the myocardium.  He had extensive apical, anteroapical, anteroseptal reversible ischemia as well as some additional evidence for inferior scar and distal anteroseptal scar.  He developed 2-3 mm ST-segment depression with frequent ectopy noted electrocardiographically on his stress test.  He is now referred for cardiac catheterization.  PROCEDURE:  After premedication with Versed 2 mg plus fentanyl 25 mcg, the patient was prepped and draped in usual fashion.  Right femoral artery was punctured anteriorly and a 5-French sheath was inserted.  The patient was still fairly anxious and received an additional 1 mg of Versed plus 25 mcg of fentanyl.  Diagnostic catheterization was done utilizing 5-French Judkins for left and right coronary catheters.  The right catheter was also used for selective angiography into the left subclavian internal mammary artery system due to potential need for CABG revascularization surgery.  A  5-French pigtail catheter was used for left ventriculography.  With his longstanding hypertensive history and significant coronary artery disease, distal aortography was also performed to make certain he did not have any significant renal artery stenosis or aortoiliac disease.  Hemostasis was obtained by direct manual pressure.  He tolerated the procedure well.  HEMODYNAMIC DATA:  Central aortic pressure is 145/51.  Left ventricular pressure 145/13.  Post A-wave 24.  ANGIOGRAPHIC DATA:  There was evidence for coronary calcification.  The left main coronary artery was short relatively normal vessel, which bifurcated into the LAD and left circumflex system.  The ostium of the LAD had 40% to 50% smooth eccentric narrowing followed by 40% narrowing.  Just proximal to the moderate to large size septal perforating artery, there was 90% stenosis and just after the septal perforating artery, the LAD was 99% stenosed and totally occluded. There was a long segment of mid nonvisualization and then in the midportion of the LV.  The LAD was recanalized and was a large-caliber vessel with a good distal target, which extended to and wrapped around the LV apex.  Circumflex vessel gave rise to a marginal vessel that had 70% ostial narrowing followed by 50% stenosis.  The second marginal vessel was large caliber and had smooth 50% to 60% narrowing.  There was collateralization to the distal RCA, the circumflex, collaterals.  The right coronary artery had 50% to 60% proximal irregularity.  In the  region of the acute margin, the right coronary artery was totally occluded.  There was 60% narrowing in the anterior RV marginal branch. The distal RCA filled via collaterals from the left system.  RAO ventriculography revealed a global ejection fraction of approximately 45%.  There is moderate mid anterior and lateral hypocontractility followed by distal inferior hypocontractility on the RAO projection.   Distal aortography revealed patent renal arteries. There was no significant aortoiliac disease.  The left subclavian and bypass LIMA vessel were angiographically normal and suitable for CABG revascularization surgery.  The right vertebral artery was large caliber.  IMPRESSION: 1. Mild left ventricular dysfunction with mild mid anterolateral and     distal inferior hypocontractility. 2. Coronary calcification with significant multivessel coronary artery     disease with 40% to 50% ostial proximal left anterior descending     narrowing followed by 90% stenosis proximal to a septal perforating     artery and subtotal/total left anterior descending after the septal     perforating artery with large mid distal left anterior descending,     representing a suitable target for coronary artery bypass grafting     surgery. 3. 70% followed by 50% OM1 stenosis of the circumflex vessel with 50%     to 60% smooth stenosis and a large OM-2 vessel. 4. 50% to 60% proximal narrowing of the right coronary artery with     total occlusion of the right coronary artery in the region of the     acute margin with left-to-right collaterals.  RECOMMENDATIONS:  Surgical consultation will be obtained for consideration of CABG revascularization surgery.          ______________________________ Nicki Guadalajara, M.D.     TK/MEDQ  D:  03/02/2011  T:  03/02/2011  Job:  [id number]  cc:   Griffith Citron, M.D. Pearla Dubonnet, M.D.  Electronically Signed by Nicki Guadalajara M.D. on 03/15/2011 12:09:58 PM

## 2011-03-15 NOTE — Progress Notes (Signed)
[facility]  NAME:  Shane Espinoza, Shane Espinoza         ACCOUNT NO.:  [id number]  MEDICAL RECORD NO.:  [id number]      DATE OF BIRTH:  04/17/1941  PHYSICIAN:  [place] Mutasim Tuckey, MD          VISIT DATE:  03/14/2011                                 TEE/Cardioversion   OPERATOR:  [place] Alfonzo Arca, MD  This is a TEE/cardioversion.  INDICATION:  Persistent atrial fibrillation.  HISTORY OF PRESENT ILLNESS:  [name] is a 70 year old gentleman, who recently underwent multivessel bypass surgery and was found to be in atrial fibrillation postoperatively.  He was maintained on amiodarone, however, had problems with nausea on that medication, the dose was then decreased and his symptoms improved.  Despite this, his atrial fibrillation persisted and he was referred for TEE and cardioversion.  PROCEDURE:  After procedural time-out, the patient was brought to the endoscopy suite and after informed consent was verified, the patient was moderately sedated with Versed and fentanyl.  He then underwent TEE which revealed no evidence of left atrial appendage thrombus.  LV systolic function was well preserved.  Please see complete details of the TEE.  After the heart was cleared of thrombus, he was further sedated with the help of anesthesia and underwent successful cardioversion with one shock and 150 joules biphasic.  This was reestablished to sinus rhythm and he was recovered by Anesthesia and returned to his room.  There were no acute procedural complications noted.     [place] Maguadalupe Lata, MD     CH/MEDQ  D:  03/14/2011  T:  03/15/2011  Job:  [id number]  cc:   Kerin Perna, M.D.

## 2011-03-16 LAB — CBC
HCT: 29 % — ABNORMAL LOW (ref 39.0–52.0)
Hemoglobin: 9.7 g/dL — ABNORMAL LOW (ref 13.0–17.0)
MCH: 29.8 pg (ref 26.0–34.0)
MCHC: 33.4 g/dL (ref 30.0–36.0)
MCV: 89 fL (ref 78.0–100.0)
Platelets: 334 10*3/uL (ref 150–400)
RBC: 3.26 MIL/uL — ABNORMAL LOW (ref 4.22–5.81)
RDW: 15.3 % (ref 11.5–15.5)
WBC: 13.4 10*3/uL — ABNORMAL HIGH (ref 4.0–10.5)

## 2011-03-16 LAB — PROTIME-INR
INR: 1.24 (ref 0.00–1.49)
Prothrombin Time: 15.9 seconds — ABNORMAL HIGH (ref 11.6–15.2)

## 2011-03-16 LAB — HEPARIN LEVEL (UNFRACTIONATED): Heparin Unfractionated: 0.15 IU/mL — ABNORMAL LOW (ref 0.30–0.70)

## 2011-03-17 LAB — GLUCOSE, CAPILLARY: Glucose-Capillary: 103 mg/dL — ABNORMAL HIGH (ref 70–99)

## 2011-03-24 NOTE — Op Note (Signed)
NAMEMarland Kitchen  MCKAY, TEGTMEYER NO.:  [id number]  MEDICAL RECORD NO.:  [id number]  LOCATION:  2311                         FACILITY:  [facility]  PHYSICIAN:  Kerin Perna, M.D.  DATE OF BIRTH:  09/29/1940  DATE OF PROCEDURE:  03/07/2011 DATE OF DISCHARGE:                              OPERATIVE REPORT   OPERATIONS: 1. Coronary artery bypass grafting x4 (left internal mammary artery to     LAD, saphenous vein graft to obtuse marginal, saphenous vein graft     to posterior descending, saphenous vein graft to RV marginal). 2. Endoscopic harvest of right leg greater saphenous vein.  PREOPERATIVE DIAGNOSIS:  Class III exertional shortness of breath and angina.  POSTOPERATIVE DIAGNOSIS:  Class III exertional shortness of breath and angina.  SURGEON:  Kerin Perna, MD  ASSISTANT:  Coral Ceo, PA  ANESTHESIA:  General by Maren Beach, MD  INDICATIONS:  The patient is a 70 year old obese Caucasian male who presents with progressive dyspnea and chest discomfort with exertion.  A stress test was positive for ischemia and cardiac catheterization by Dr. Daphene Jaeger, demonstrated severe multivessel disease with chronic occlusion of both the LAD and right coronaries.  EF was 45-50% and the patient was felt to be candidate for surgical revascularization.  Prior to surgery, I reviewed results of the cardiac cath with the patient and family and discussed indications, benefits, and risks of coronary artery bypass surgery for treatment of his severe coronary artery disease.  I reviewed the alternatives to surgical therapy as well.  I discussed the major issues of surgery including the location of the surgical incisions, the use of general anesthesia and cardiopulmonary bypass, and the expected postoperative hospital recovery.  I discussed with the patient the risks to him of coronary artery bypass surgery including risk of stroke, bleeding, blood transfusion, MI, infection,  and death. After reviewing these issues, he demonstrated his understanding and agreed to proceed with the surgery under what I felt was an informed consent.  OPERATIVE FINDINGS: 1. Good-quality conduit, fair-quality targets. 2. Mild inferior apical scarring. 3. No intraoperative blood products required. 4. Improved LV function by TEE following separation from     cardiopulmonary bypass.  DESCRIPTION OF PROCEDURE:  The patient was brought to the operating room and placed supine on the operating table where general anesthesia was induced under invasive hemodynamic monitoring.  A transesophageal 2D echo probe was placed by the anesthesiologist.  A proper time-out was performed to confirm proper patient, proper site.  The patient was prepped and draped as a sterile field.  The sternal incision was made as the vein was harvested endoscopically from the right leg.  The left internal mammary artery was harvested as a pedicle graft from its origin at the subclavian vessels.  The sternum was then retracted and the pericardium opened and suspended.  Pursestrings were placed in the ascending aorta and right atrium after the patient was observed and the vein was found to be adequate.  The patient was heparinized and the ACT was documented as being therapeutic.  The patient was then cannulated and placed on cardiopulmonary bypass.  The coronaries were identified for grafting in the mammary artery and vein grafts were prepared for  the distal anastomoses.  Cardioplegia cannulas were placed both antegrade and retrograde cold blood cardioplegia.  The patient was cooled to 32 degrees and the aortic crossclamp was applied.  A 800 mL of cold blood cardioplegia was delivered in split doses between antegrade aortic retrograde coronary sinus catheters.  There is good cardioplegic arrest and septal temperature dropped less than 15 degrees.  Cardioplegia was delivered every 20 minutes or less while the  crossclamp was in place.  The distal coronary anastomoses were then performed.  First distal anastomosis was to the RV marginal branch of the right.  This had a proximal 80% stenosis.  It was 1.5-mm vessel.  Reverse saphenous vein was sewn end-to-side with running 7-0 Prolene with good flow through the graft.  The second distal anastomosis was to the posterior descending branch of the distal right.  This was chronically occluded proximally. The vessel was 1.2 mm and a reverse saphenous vein was sewn end-to-side with running 7-0 Prolene with good flow through the graft.  Cardioplegia was redosed.  The third distal anastomosis was to the OM branch of the circumflex.  This is a larger 1.5-1.7-mm vessel with proximal 80% stenosis.  A reverse saphenous vein was sewn end-to-side with running 7- 0 Prolene with good flow through the graft.  The fourth distal anastomosis was to the distal LAD close to the apex.  Proximally, the LAD was totally and chronically occluded.  The left internal mammary artery pedicle was brought through an opening created in the left lateral pericardium, was brought down on the LAD and sewn end-to-side with running 8-0 Prolene.  There is excellent flow through the anastomosis after briefly releasing the pedicle bulldog on the mammary artery.  The bulldog was reapplied and the mammary pedicle was secured to epicardium.  Cardioplegia was redosed.  While crossclamp was still in place, 3 proximal vein anastomoses were performed on the ascending aorta using a 4.5-mm punch running 7-0 Prolene.  Prior to tying down the final proximal anastomosis, air was vented from the coronaries with a dose of retrograde warm blood cardioplegia.  The crossclamp was then removed.  The heart resumed a spontaneous rhythm.  The bypass grafts were opened and aspirated of any residual air.  Each had good flow and hemostasis was documented at the proximal and distal anastomoses.  The patient  was rewarmed to 37 degrees and temporary pacing wires were applied.  After adequate rewarming, the patient was ventilated and then weaned off bypass on low-dose dobutamine.  Cardiac output and blood pressure were stable.  The echo showed improved global LV function.  Protamine was administered without adverse reaction.  The cannulas removed and mediastinum was irrigated with warm saline.  The superior pericardial fat was closed over the aorta.  One mediastinal and a left pleural chest tube were placed and brought out through the separate incisions.  The sternum was reapproximated with interrupted steel wire.  The pectoralis fascia was closed with running #1 Vicryl.  Subcutaneous and skin layers were closed with running Vicryl and sterile dressings were applied.  Total cardiopulmonary bypass time was 120 minutes.    Kerin Perna, M.D.    PV/MEDQ  D:  03/07/2011  T:  03/08/2011  Job:  [id number]  cc:   Nicki Guadalajara, M.D.  Electronically Signed by Kerin Perna M.D. on 03/24/2011 01:23:33 PM

## 2011-03-24 NOTE — Consult Note (Signed)
NAME:  Shane Espinoza, Shane Espinoza NO.:  [id number]  MEDICAL RECORD NO.:  [id number]  LOCATION:                                 FACILITY:  PHYSICIAN:  Kerin Perna, M.D.  DATE OF BIRTH:  [birth year]  DATE OF CONSULTATION:  03/03/2011 DATE OF DISCHARGE:                                CONSULTATION   PRIMARY CARE PHYSICIAN:  Pearla Dubonnet, MD  REASON FOR CONSULTATION:  Severe multivessel coronary artery disease with class III angina/CHF.  HISTORY OF PRESENT ILLNESS:  I was asked to evaluate this 70 year old Caucasian male, ex-smoker for possible multivessel bypass grafting after being recently diagnosed with severe multivessel coronary artery disease.  The patient presented for evaluation by Dr. Tresa Endo for symptoms of decreasing exercise tolerance and shortness of breath with exertion as well as a brief episode of an atrial arrhythmia during colonoscopy earlier this summer.  A colonoscopy was performed for history of multiple previous colon polyps all of which have been benign.  Dr. Tresa Endo performed a nuclear perfusion scan, which demonstrated ischemic defect. A 2-D echo was performed, which showed left ventricular hypertrophy with ejection fraction of 50% and mild mitral regurgitation.  The patient was noted to have frequent PACs during his nuclear stress test.  He underwent diagnostic cardiac cath yesterday.  He was noted to have severe multivessel disease with chronic occlusion in the LAD, chronic occlusion of the distal right coronary, and 70% stenosis of a large obtuse marginal branch of left circumflex.  His ejection fraction was 45% with inferior apical hypokinesia.  LVEDP was measured at 16 mmHg. There is no evidence of aortic stenosis or gradient across the aortic valve.  Based on the patient's coronary anatomy and positive stress test, he was felt to be candidate for multivessel bypass grafting.  PAST MEDICAL HISTORY: 1. Hypertension. 2. Anxiety  disorder. 3. History of gout. 4. Colon polyps. 5. No known drug allergies.  MEDICATIONS:  Home medications, 1. Diovan HCT 320/25 mg 1-1/2 pill per day. 2. Tramadol 50 mg p.r.n. 3. Colchicine 0.6 mg p.r.n., gout. 4. Simvastatin 10 mg a day. 5. Amlodipine 5 mg day. 6. Fish oil and vitamin D. 7. Metoprolol 25 mg b.i.d.  SOCIAL HISTORY:  The patient has been married for 46 years and is retired.  His wife is a retired Engineer, civil (consulting).  He does not drink alcohol or use tobacco currently.  FAMILY HISTORY:  Positive for MI in his mother at age 71.  Positive for a stroke in his father at age 71 and a grandmother with history of several MIs.  REVIEW OF SYSTEMS:  CONSTITUTIONAL:  Negative for fever or weight loss. He has always been overweight.  ENT:  Negative for change in vision.  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 Ashley Royalty 3 to 4 years ago.  That has been his most major operation.  Dental: Negative.  THORACIC:  Negative for history of chest trauma, abnormal chest x-ray, or symptoms of upper respiratory infection.  CARDIAC: Positive for his coronary artery disease.  Negative for significant valvular disease.  Positive for mild LV dysfunction and positive for atrial arrhythmia, mild.  GI:  Positive for colon polyps.  Negative  for gallbladder disease, gallstones, blood per rectum, or jaundice. ENDOCRINE:  Negative for diabetes or thyroid disease.  VASCULAR: Negative for DVT claudication or TIA.  His carotid duplex scans have shown negative significant stenosis.  NEUROLOGIC:  Negative for stroke or seizure.  PSYCHOLOGIC:  Positive for anxiety and anger management.  PHYSICAL EXAMINATION:  VITAL SIGNS:  The patient is 5 feet 8 inches, weighs 205 pounds.  Blood pressure is 158/88, pulse 84 sinus. GENERAL APPEARANCE:  A pleasant middle-aged Caucasian male who is in hospital room accompanied by his wife in no acute distress. HEENT:  Normocephalic.  Pupils are equal.   Dentition is good. NECK:  Without JVD, mass, or bruit. LYMPHATICS:  Reveal no abnormal cervical or supraclavicular adenopathy. THORAX:  Without deformity.  Breath sounds are clear and equal. CARDIAC:  Regular rhythm with a soft 1/6 systolic murmur.  No rub. ABDOMINAL:  Soft and nontender without pulsatile mass. EXTREMITIES:  There is no clubbing, cyanosis, or edema.  There is no evidence of venous insufficiency in lower extremities.  Peripheral pulses are 2+ in all extremities. NEUROLOGIC:  Alert and oriented without focal motor deficit.  LABORATORY DATA:  Reviewed the coronary arteriograms from Dr. Landry Dyke cath and agree with his impression of severe multivessel disease with mild-to-moderate LV dysfunction.  RECOMMENDATIONS:  He would benefit from multivessel bypass grafting with targets being the LAD, distal right OM, and possibly the ramus.  Surgery will be scheduled for March 07, 2011, at [facility].  I have discussed the procedure in detail with the patient and wife including the risks and benefits and they understand and agreed to proceed.  Thank you very much for the consultation.     Kerin Perna, M.D.     PV/MEDQ  D:  03/03/2011  T:  03/03/2011  Job:  [id number]  Electronically Signed by Kerin Perna M.D. on 03/24/2011 01:23:27 PM

## 2011-03-24 NOTE — Discharge Summary (Signed)
NAMEMarland Kitchen  Shane Espinoza, Shane Espinoza NO.:  [id number]  MEDICAL RECORD NO.:  [id number]  LOCATION:  2015                         FACILITY:  [facility]  PHYSICIAN:  Shane Espinoza Espinoza, M.D.  DATE OF BIRTH:  July 15, 1940  DATE OF ADMISSION:  03/07/2011 DATE OF DISCHARGE:  03/15/2011                              DISCHARGE SUMMARY   PRIMARY ADMITTING DIAGNOSIS:  Chest pain.  ADDITIONAL/DISCHARGE DIAGNOSES: 1. Severe multivessel coronary artery disease. 2. Class III angina. 3. Hypertension. 4. Anxiety disorder. 5. History of gout. 6. Colon polyps. 7. Postoperative atrial fibrillation.  PROCEDURES PERFORMED: 1. Coronary artery bypass grafting x4 (left internal mammary artery to     the left anterior descending, saphenous vein graft to the obtuse     marginal, saphenous vein graft to the RV marginal, saphenous vein     graft to posterior descending). 2. Endoscopic vein harvest right leg. 3. Transesophageal echocardiogram cardioversion.  HISTORY:  The patient is a 70 year old male who was recently seen by Dr. Tresa Espinoza for evaluation of shortness of breath with exertion and chest discomfort.  He has recently begun to experience decreased exercise tolerance as well as some shortness of breath with exertion.  He does have a strong family history of coronary artery disease with his mother dying of a sudden cardiac arrest at age 89.  He also recently had some atrial arrhythmia associated with colonoscopy.  Dr. Tresa Espinoza evaluated the patient and performed a nuclear perfusion scan which demonstrated an ischemic defect.  A 2-D echocardiogram was also performed which showed left ventricular hypertrophy with ejection fraction of 50% and mild mitral regurgitation.  The patient was noted to have frequent PACs during his nuclear stress test.  He underwent a diagnostic cardiac catheterization on March 02, 2011.  He was noted to have severe multivessel coronary artery disease including chronic occlusion  of the LAD, and chronic occlusion of the distal right coronary, and a 70% stenosis of a large obtuse marginal branch of the left circumflex. Ejection fraction was 45% with inferior apical hypokinesis.  There was no evidence of aortic stenosis or gradient across the aortic valve. Based on his multivessel coronary artery disease, he was felt to be a poor candidate for percutaneous intervention.  A Cardiothoracic Surgery consultation was obtained and the patient was seen by Dr. Kathlee Nations Espinoza.  After review of his films, Dr. Donata Espinoza agreed with the need for coronary artery bypass grafting.  He explained all risks, benefits and alternatives of surgery to the patient and the patient agreed to proceed.  He was scheduled to return for outpatient admission on March 07, 2011.  He did remain stable and pain-free in the interim.  HOSPITAL COURSE:  Shane Espinoza Espinoza was admitted to [facility] on March 07, 2011, and was taken to the operating room where he underwent coronary artery bypass grafting x4 performed by Dr. Donata Espinoza. Please see previously dictated operative report for complete details of surgery.  He tolerated the procedure well and was transferred to the SICU in stable condition.  He was extubated shortly after surgery.  He was hemodynamically stable and doing well on postop day #1.  His chest tubes and hemodynamic monitoring lines were removed and he was  able to be started on routine postoperative progression.  He did develop atrial fibrillation and was started on amiodarone as well as a beta blocker. His rate became better controlled on these medications but he did remain in atrial fibrillation.  His dosages were titrated and also digoxin was added to his medication regimen.  He did develop some nausea which was thought to be secondary to the amiodarone and his dose was decreased accordingly.  Because of his previous history of intermittent atrial arrhythmias and his  persistent rate-controlled atrial fibrillation despite multiple medications,  Cardiology felt that he would benefit from a TEE direct cardioversion.  He was then started on heparin drip in anticipation of the procedure.  Otherwise, he has done fairly well postoperatively.  He was able to be transferred to the step-down unit on postop day #2.  He did develop a leukocytosis with a white blood cell count of 18,000.  He had no associated fevers and other labs including urinalysis were all within normal limits.  This was carefully monitored and his white blood cell count has trended back down to baseline at this point.  He also developed a right thigh hematoma which has been monitored and treated conservatively and is resolving at the time of this dictation.  He underwent a TEE directed cardioversion performed by Dr. Rennis Espinoza on March 14, 2011, and was converted back to sinus rhythm after one shock of 150 joules.  Please see the previously dictated procedure report for complete details.  Currently, he is doing well.  He is ambulating both independently and cardiac rehab phase 1 and is progressing well with mobility.  He remains somewhat volume overloaded and is approximately 5 kg above his preoperative weight with some lower extremity edema on physical exam.  He remains on Lasix and he is diuresing well.  He has been afebrile and his vital signs have been stable.  He is maintaining O2 sats of greater than 90% on room air.  His incisions are all healing well.  He is tolerating regular diet.  His most recent chest x-ray on March 14, 2011, showed small bilateral pleural effusion which are stable and improving.  Also, his labs show hemoglobin of 9.1, hematocrit 27.9, platelets 271, white count 13.3. Sodium 137, potassium 4.9, BUN 39, creatinine 1.3.  He has continued to progress well.  He will be evaluated by Cardiology today and his pacing wires and chest tube sutures will be discontinued.   He currently is maintaining sinus rhythm and it is anticipated that if he continues to remain stable he will be able to be discharged home later today, March 15, 2011.  DISCHARGE MEDICATIONS: 1. Amiodarone 200 mg b.i.d. 2. Lasix 40 mg daily x 7 days. 3. Metoprolol 12.5 mg b.i.d. 4. Oxycodone IR 5-10 mg q.4-6 h. p.r.n. pain. 5. Potassium 20 mEq daily x7 days. 6. Aspirin 325 mg daily. 7. Colchicine 0.6 mg daily p.r.n. gout. 8. Fish oil 1200 mg daily. 9. Ocuvite 1 tablet daily. 10.Simvastatin 20 mg at bedtime. 11.Vitamin D 5000 units 1 tablet daily.  DISCHARGE INSTRUCTIONS:  He was asked to refrain from driving, heavy lifting or strenuous activity.  He may continue ambulating daily and using his incentive spirometer.  He may shower daily and clean his incisions with soap and water.  He will continue the same preoperative diet.  DISCHARGE FOLLOWUP:  He will need to make an appointment to see Dr. Tresa Espinoza in 2 weeks.  He will then follow up with Dr. Donata Espinoza in  3 weeks with a chest x-ray.  In the interim, if he experiences any problems or has questions, he is asked to contact our office immediately.     Coral Ceo, P.A.   ______________________________ Shane Espinoza Espinoza, M.D.   GC/MEDQ  D:  03/15/2011  T:  03/15/2011  Job:  [id number]  cc:   Pearla Dubonnet, M.D. Nicki Guadalajara, M.D. [facility]  Electronically Signed by Coral Ceo P.A. on 03/24/2011 01:14:58 PM Electronically Signed by Shane Espinoza Espinoza M.D. on 03/24/2011 01:23:43 PM

## 2011-03-24 NOTE — Discharge Summary (Signed)
  NAME:  Shane Espinoza, LACK NO.:  [id number]  MEDICAL RECORD NO.:  [id number]  LOCATION:  2015                         FACILITY:  [facility]  PHYSICIAN:  Kerin Perna, M.D.  DATE OF BIRTH:  17-Oct-1940  DATE OF ADMISSION:  03/07/2011 DATE OF DISCHARGE:                              DISCHARGE SUMMARY   ADDENDUM:  In addition to the previously dictated discharge summary, it should be noted that the patient was started on Coumadin prior to discharge home.  He will be started on 5 mg daily and will have an INR drawn by home health nurse on Monday, March 20, 2011, with results to be called to Dr. Evlyn Clines office for further management of his anticoagulation.  There are no other changes in the previously dictated discharge summary.     Coral Ceo, P.A.   ______________________________ Kerin Perna, M.D.    GC/MEDQ  D:  03/15/2011  T:  03/15/2011  Job:  [id number] cc:   Nicki Guadalajara, M.D.  Electronically Signed by Weldon Inches. on 03/24/2011 01:15:06 PM Electronically Signed by Kerin Perna M.D. on 03/24/2011 01:23:47 PM

## 2011-03-31 ENCOUNTER — Other Ambulatory Visit: Payer: Self-pay | Admitting: Cardiothoracic Surgery

## 2011-03-31 DIAGNOSIS — I2510 Atherosclerotic heart disease of native coronary artery without angina pectoris: Secondary | ICD-10-CM

## 2011-04-05 ENCOUNTER — Ambulatory Visit
Admission: RE | Admit: 2011-04-05 | Discharge: 2011-04-05 | Disposition: A | Discharge status: Home / Self Care | Payer: Medicare Other | Source: Ambulatory Visit | Attending: Cardiothoracic Surgery | Admitting: Cardiothoracic Surgery

## 2011-04-05 ENCOUNTER — Encounter: Payer: Self-pay | Admitting: *Deleted

## 2011-04-05 ENCOUNTER — Encounter: Payer: Self-pay | Admitting: Cardiothoracic Surgery

## 2011-04-05 ENCOUNTER — Ambulatory Visit (INDEPENDENT_AMBULATORY_CARE_PROVIDER_SITE_OTHER): Payer: Self-pay | Admitting: Cardiothoracic Surgery

## 2011-04-05 VITALS — BP 143/85 | HR 58 | Resp 16 | Ht 69.0 in | Wt 198.0 lb

## 2011-04-05 DIAGNOSIS — I2510 Atherosclerotic heart disease of native coronary artery without angina pectoris: Secondary | ICD-10-CM

## 2011-04-05 DIAGNOSIS — Z951 Presence of aortocoronary bypass graft: Secondary | ICD-10-CM

## 2011-04-05 NOTE — Patient Instructions (Signed)
OK to start cardiac rehab, lift up to 10 lbs and drive your automobile.

## 2011-04-05 NOTE — Progress Notes (Signed)
HPI the patient returns for his first post op office visit following CABG x4 on 03/07/2011. The patient is a 70 year old male who presented with exertional of TUMS and a positive stress test. Cardiac catheter by Dr. Tresa Endo demonstrated severe multivessel disease including chronic occlusion of both the LAD and right coronary. He underwent left IMA graft to the LAD and vein graft to the OM and RV marginal. The patient is done well at home without recurrent angina shortness of breath and the surgical incisions are healing well. He has lost approximately 18 pounds in weight and is walking 20 minutes daily. Is ready to be in cardiac rehabilitation next week. He is maintained a sinus rhythm and has had no complications from the Coumadin therapy. He has an appointment to see Dr. Nicholaus Bloom tomorrow  Current Outpatient Prescriptions  Medication Sig Dispense Refill  . amiodarone (PACERONE) 200 MG tablet Take 200 mg by mouth 2 (two) times daily.        Marland Kitchen aspirin 325 MG EC tablet Take 325 mg by mouth daily.        . beta carotene w/minerals (OCUVITE) tablet Take 1 tablet by mouth daily.        . cholecalciferol (VITAMIN D) 1000 UNITS tablet Take 5,000 Units by mouth daily.        . fish oil-omega-3 fatty acids 1000 MG capsule Take 1,200 mg by mouth daily.        . metoprolol tartrate (LOPRESSOR) 25 MG tablet Take 12.5 mg by mouth 2 (two) times daily.        . simvastatin (ZOCOR) 20 MG tablet Take 20 mg by mouth at bedtime. .25 tablet       . warfarin (COUMADIN) 5 MG tablet Take 5 mg by mouth daily. Or as directed           Review of Systems: No fever no night sweats. He complains of insomnia, constipation, and some mild numbness of his left fifth finger   Physical Exam Pressure 140/80 pulse 68 and regular   oxygen saturation 98 percent on room air. General appearance is a middle-aged male no distress. Breath sounds are slightly diminished at the left base. Sternal incision is stable well-healed. The right  leg vein incision is healed. There is a mild superficial ecchymoses of the thigh.   Diagnostic Tests His chest x-ray today shows a small left pleural effusion otherwise clear lung fields and the sternal wires are intact.   Impression: Stable course 3 weeks following multivessel bypass grafting. He'll begin driving, and the cardiac rehabilitation, and lift up to 10 pounds maximum until his next visit.   Plan: The patient returned to the office for followup of the chest x-ray in 3 weeks. No changes in medications. His Coumadin and amiodarone will be addressed by his cardiologist Dr. Nicholaus Bloom.

## 2011-04-10 ENCOUNTER — Encounter (HOSPITAL_COMMUNITY)
Admission: RE | Admit: 2011-04-10 | Discharge: 2011-04-10 | Disposition: A | Discharge status: Home / Self Care | Payer: Medicare Other | Source: Ambulatory Visit | Attending: Cardiovascular Disease | Admitting: Cardiovascular Disease

## 2011-04-10 DIAGNOSIS — I4891 Unspecified atrial fibrillation: Secondary | ICD-10-CM | POA: Insufficient documentation

## 2011-04-10 DIAGNOSIS — Z951 Presence of aortocoronary bypass graft: Secondary | ICD-10-CM | POA: Insufficient documentation

## 2011-04-10 DIAGNOSIS — I10 Essential (primary) hypertension: Secondary | ICD-10-CM | POA: Insufficient documentation

## 2011-04-10 DIAGNOSIS — F411 Generalized anxiety disorder: Secondary | ICD-10-CM | POA: Insufficient documentation

## 2011-04-10 DIAGNOSIS — I209 Angina pectoris, unspecified: Secondary | ICD-10-CM | POA: Insufficient documentation

## 2011-04-10 DIAGNOSIS — I2510 Atherosclerotic heart disease of native coronary artery without angina pectoris: Secondary | ICD-10-CM | POA: Insufficient documentation

## 2011-04-10 DIAGNOSIS — Z7982 Long term (current) use of aspirin: Secondary | ICD-10-CM | POA: Insufficient documentation

## 2011-04-10 DIAGNOSIS — Z5189 Encounter for other specified aftercare: Secondary | ICD-10-CM | POA: Insufficient documentation

## 2011-04-10 DIAGNOSIS — I2582 Chronic total occlusion of coronary artery: Secondary | ICD-10-CM | POA: Insufficient documentation

## 2011-04-10 DIAGNOSIS — Z79899 Other long term (current) drug therapy: Secondary | ICD-10-CM | POA: Insufficient documentation

## 2011-04-10 DIAGNOSIS — Z8249 Family history of ischemic heart disease and other diseases of the circulatory system: Secondary | ICD-10-CM | POA: Insufficient documentation

## 2011-04-12 ENCOUNTER — Encounter (HOSPITAL_COMMUNITY): Payer: Medicare Other

## 2011-04-14 ENCOUNTER — Encounter (HOSPITAL_COMMUNITY): Payer: Medicare Other

## 2011-04-17 ENCOUNTER — Encounter (HOSPITAL_COMMUNITY): Payer: Medicare Other

## 2011-04-19 ENCOUNTER — Encounter (HOSPITAL_COMMUNITY): Payer: Medicare Other

## 2011-04-20 ENCOUNTER — Other Ambulatory Visit: Payer: Self-pay | Admitting: Cardiothoracic Surgery

## 2011-04-20 DIAGNOSIS — I2510 Atherosclerotic heart disease of native coronary artery without angina pectoris: Secondary | ICD-10-CM

## 2011-04-21 ENCOUNTER — Encounter (HOSPITAL_COMMUNITY): Payer: Medicare Other

## 2011-04-24 ENCOUNTER — Encounter (HOSPITAL_COMMUNITY): Payer: Medicare Other

## 2011-04-25 DIAGNOSIS — Z951 Presence of aortocoronary bypass graft: Secondary | ICD-10-CM

## 2011-04-25 DIAGNOSIS — I10 Essential (primary) hypertension: Secondary | ICD-10-CM | POA: Insufficient documentation

## 2011-04-25 DIAGNOSIS — I2510 Atherosclerotic heart disease of native coronary artery without angina pectoris: Secondary | ICD-10-CM

## 2011-04-25 DIAGNOSIS — F419 Anxiety disorder, unspecified: Secondary | ICD-10-CM | POA: Insufficient documentation

## 2011-04-26 ENCOUNTER — Encounter (HOSPITAL_COMMUNITY): Payer: Medicare Other

## 2011-04-26 ENCOUNTER — Ambulatory Visit (INDEPENDENT_AMBULATORY_CARE_PROVIDER_SITE_OTHER): Payer: Self-pay | Admitting: Cardiothoracic Surgery

## 2011-04-26 ENCOUNTER — Encounter: Payer: Self-pay | Admitting: Cardiothoracic Surgery

## 2011-04-26 ENCOUNTER — Ambulatory Visit
Admission: RE | Admit: 2011-04-26 | Discharge: 2011-04-26 | Disposition: A | Discharge status: Home / Self Care | Payer: Medicare Other | Source: Ambulatory Visit | Attending: Cardiothoracic Surgery | Admitting: Cardiothoracic Surgery

## 2011-04-26 VITALS — BP 136/74 | HR 68 | Resp 18 | Ht 69.0 in | Wt 193.0 lb

## 2011-04-26 DIAGNOSIS — I2510 Atherosclerotic heart disease of native coronary artery without angina pectoris: Secondary | ICD-10-CM

## 2011-04-26 DIAGNOSIS — Z951 Presence of aortocoronary bypass graft: Secondary | ICD-10-CM

## 2011-04-26 NOTE — Patient Instructions (Signed)
OK to drive,OK to lift up to 15 pounds,continue cardiac rehab.

## 2011-04-26 NOTE — Progress Notes (Signed)
                   [address]            [place] [address]          [phone]    HPI: Patient returns for routine postoperative follow-up having undergone CABG x4 on 03/07/2011.. The patient's early postoperative recovery while in the hospital was notable for transient atrial fibrillation which converted to sinus rhythm on amiodarone. The patient was discharged home on Coumadin. He is maintained sinus rhythm. He has had no bleeding complications from the Coumadin. Since hospital discharge the patient reports he is gaining strength. He is walking over a mile daily. He has had no recurrent angina. The surgical incisions are healing well. He denies fluid retention or ankle edema..   Current Outpatient Prescriptions  Medication Sig Dispense Refill  . amiodarone (PACERONE) 200 MG tablet Take 300 mg by mouth daily.       Marland Kitchen aspirin 81 MG tablet Take 81 mg by mouth daily.        . beta carotene w/minerals (OCUVITE) tablet Take 1 tablet by mouth daily.        . cholecalciferol (VITAMIN D) 1000 UNITS tablet Take 5,000 Units by mouth daily.        . fish oil-omega-3 fatty acids 1000 MG capsule Take 1,200 mg by mouth daily.        . metoprolol tartrate (LOPRESSOR) 25 MG tablet Take 12.5 mg by mouth 2 (two) times daily.        . simvastatin (ZOCOR) 20 MG tablet Take 20 mg by mouth at bedtime. .25 tablet       . valsartan-hydrochlorothiazide (DIOVAN-HCT) 80-12.5 MG per tablet Take 1 tablet by mouth daily.        Marland Kitchen warfarin (COUMADIN) 5 MG tablet Take 5 mg by mouth daily. Or as directed          Physical Exam: Vital signs blood pressure 118/70 pulse 78 sinus saturation room air 96%. General appearance is that of a pleasant Caucasian male accompanied by his wife. Breath sounds are clear and equal. The sternal incision is well-healed. Cardiac rhythm is regular is no S3 or murmur or rub. Leg incision is healing well the previous right groin incision is resolved. Neuro is  intact.   Diagnostic Tests: PA and lateral chest x-ray shows clear lung fields. There is resolution of the previous left pleural effusion. Sternal wires are intact.  Impression: Stable course one month following multivessel bypass grafting. The patient tissue improve. Dr. Nicholaus Bloom has adjusted his amiodarone down to 300 mg daily. He'll continue his Coumadin at this time. No changes in medicines are made at this time.  Plan:Followup exam in 3-4 weeks. He'll continue going to cardiac rehabilitation at this time and the patient understands he can start driving.

## 2011-04-28 ENCOUNTER — Encounter (HOSPITAL_COMMUNITY): Payer: Medicare Other

## 2011-05-01 ENCOUNTER — Encounter (HOSPITAL_COMMUNITY): Payer: Medicare Other

## 2011-05-03 ENCOUNTER — Encounter (HOSPITAL_COMMUNITY): Payer: Medicare Other

## 2011-05-05 ENCOUNTER — Encounter (HOSPITAL_COMMUNITY): Payer: Medicare Other

## 2011-05-05 DIAGNOSIS — I10 Essential (primary) hypertension: Secondary | ICD-10-CM | POA: Insufficient documentation

## 2011-05-05 DIAGNOSIS — Z8249 Family history of ischemic heart disease and other diseases of the circulatory system: Secondary | ICD-10-CM | POA: Insufficient documentation

## 2011-05-05 DIAGNOSIS — I4891 Unspecified atrial fibrillation: Secondary | ICD-10-CM | POA: Insufficient documentation

## 2011-05-05 DIAGNOSIS — I2510 Atherosclerotic heart disease of native coronary artery without angina pectoris: Secondary | ICD-10-CM | POA: Insufficient documentation

## 2011-05-05 DIAGNOSIS — F411 Generalized anxiety disorder: Secondary | ICD-10-CM | POA: Insufficient documentation

## 2011-05-05 DIAGNOSIS — Z79899 Other long term (current) drug therapy: Secondary | ICD-10-CM | POA: Insufficient documentation

## 2011-05-05 DIAGNOSIS — Z7982 Long term (current) use of aspirin: Secondary | ICD-10-CM | POA: Insufficient documentation

## 2011-05-05 DIAGNOSIS — Z5189 Encounter for other specified aftercare: Secondary | ICD-10-CM | POA: Insufficient documentation

## 2011-05-05 DIAGNOSIS — Z951 Presence of aortocoronary bypass graft: Secondary | ICD-10-CM | POA: Insufficient documentation

## 2011-05-05 DIAGNOSIS — I209 Angina pectoris, unspecified: Secondary | ICD-10-CM | POA: Insufficient documentation

## 2011-05-05 DIAGNOSIS — I2582 Chronic total occlusion of coronary artery: Secondary | ICD-10-CM | POA: Insufficient documentation

## 2011-05-08 ENCOUNTER — Encounter (HOSPITAL_COMMUNITY): Payer: Medicare Other

## 2011-05-10 ENCOUNTER — Encounter (HOSPITAL_COMMUNITY): Payer: Medicare Other

## 2011-05-12 ENCOUNTER — Encounter (HOSPITAL_COMMUNITY): Payer: Medicare Other

## 2011-05-15 ENCOUNTER — Encounter (HOSPITAL_COMMUNITY): Payer: Medicare Other

## 2011-05-15 ENCOUNTER — Other Ambulatory Visit: Payer: Self-pay | Admitting: Cardiothoracic Surgery

## 2011-05-15 DIAGNOSIS — I2510 Atherosclerotic heart disease of native coronary artery without angina pectoris: Secondary | ICD-10-CM

## 2011-05-17 ENCOUNTER — Ambulatory Visit (INDEPENDENT_AMBULATORY_CARE_PROVIDER_SITE_OTHER): Payer: Self-pay | Admitting: Cardiothoracic Surgery

## 2011-05-17 ENCOUNTER — Ambulatory Visit
Admission: RE | Admit: 2011-05-17 | Discharge: 2011-05-17 | Disposition: A | Discharge status: Home / Self Care | Payer: Medicare Other | Source: Ambulatory Visit | Attending: Cardiothoracic Surgery | Admitting: Cardiothoracic Surgery

## 2011-05-17 ENCOUNTER — Encounter: Payer: Self-pay | Admitting: Cardiothoracic Surgery

## 2011-05-17 ENCOUNTER — Other Ambulatory Visit: Payer: Self-pay | Admitting: Cardiothoracic Surgery

## 2011-05-17 ENCOUNTER — Encounter (HOSPITAL_COMMUNITY): Payer: Medicare Other

## 2011-05-17 VITALS — BP 145/76 | HR 57 | Resp 16 | Ht 69.0 in | Wt 194.0 lb

## 2011-05-17 DIAGNOSIS — I2510 Atherosclerotic heart disease of native coronary artery without angina pectoris: Secondary | ICD-10-CM

## 2011-05-17 DIAGNOSIS — Z951 Presence of aortocoronary bypass graft: Secondary | ICD-10-CM

## 2011-05-17 NOTE — Progress Notes (Signed)
HPI: The patient returns for 2 month followup after multivessel bypass grafting for unstable angina. He is doing well, extending outpatient cardiac rehabilitation, and without recurrent angina. He is followed by Dr. Nicholaus Bloom carefully and recently was restarted on his Diovan for increasing blood pressure. His amiodarone has been reduced to 200 mg once a day. He has had no more recurrent atrial fib. He is taking Coumadin without complication.  Current Outpatient Prescriptions  Medication Sig Dispense Refill  . amiodarone (PACERONE) 200 MG tablet Take 200 mg by mouth daily.       Marland Kitchen aspirin 81 MG tablet Take 81 mg by mouth daily.        . beta carotene w/minerals (OCUVITE) tablet Take 1 tablet by mouth daily.        . cholecalciferol (VITAMIN D) 1000 UNITS tablet Take 5,000 Units by mouth daily.        . fish oil-omega-3 fatty acids 1000 MG capsule Take 1,200 mg by mouth daily.        . metoprolol tartrate (LOPRESSOR) 25 MG tablet Take 12.5 mg by mouth 2 (two) times daily.        . simvastatin (ZOCOR) 20 MG tablet Take 20 mg by mouth at bedtime. .25 tablet       . valsartan-hydrochlorothiazide (DIOVAN-HCT) 160-25 MG per tablet Take 1 tablet by mouth daily.        Marland Kitchen warfarin (COUMADIN) 5 MG tablet Take 5 mg by mouth daily. Or as directed           Physical Exam: Blood pressure 145/85 pulse 68 regular action saturation 96% Lungs clear bilaterally Sternum well-healed regular rhythm without murmur or gallop Extremities without edema or tenderness   Diagnostic Tests: Clear lung fields on his most recent chest x-ray without pleural effusion  Impression: Excellent early recovery 2 months after multivessel bypass grafting. He'll return to the care of his primary care physician and cardiologist and return here as needed.  Plan: Return when necessary

## 2011-05-19 ENCOUNTER — Encounter (HOSPITAL_COMMUNITY)
Admission: RE | Admit: 2011-05-19 | Discharge: 2011-05-19 | Disposition: A | Discharge status: Home / Self Care | Payer: Medicare Other | Source: Ambulatory Visit | Attending: Cardiovascular Disease | Admitting: Cardiovascular Disease

## 2011-05-22 ENCOUNTER — Encounter (HOSPITAL_COMMUNITY)
Admission: RE | Admit: 2011-05-22 | Discharge: 2011-05-22 | Disposition: A | Discharge status: Home / Self Care | Payer: Medicare Other | Source: Ambulatory Visit | Attending: Cardiovascular Disease | Admitting: Cardiovascular Disease

## 2011-05-24 ENCOUNTER — Encounter (HOSPITAL_COMMUNITY)
Admission: RE | Admit: 2011-05-24 | Discharge: 2011-05-24 | Disposition: A | Discharge status: Home / Self Care | Payer: Medicare Other | Source: Ambulatory Visit | Attending: Cardiovascular Disease | Admitting: Cardiovascular Disease

## 2011-05-29 ENCOUNTER — Encounter (HOSPITAL_COMMUNITY)
Admission: RE | Admit: 2011-05-29 | Discharge: 2011-05-29 | Disposition: A | Discharge status: Home / Self Care | Payer: Medicare Other | Source: Ambulatory Visit | Attending: Cardiovascular Disease | Admitting: Cardiovascular Disease

## 2011-05-31 ENCOUNTER — Encounter (HOSPITAL_COMMUNITY)
Admission: RE | Admit: 2011-05-31 | Discharge: 2011-05-31 | Disposition: A | Discharge status: Home / Self Care | Payer: Medicare Other | Source: Ambulatory Visit | Attending: Cardiovascular Disease | Admitting: Cardiovascular Disease

## 2011-06-02 ENCOUNTER — Encounter (HOSPITAL_COMMUNITY)
Admission: RE | Admit: 2011-06-02 | Discharge: 2011-06-02 | Disposition: A | Discharge status: Home / Self Care | Payer: Medicare Other | Source: Ambulatory Visit | Attending: Cardiovascular Disease | Admitting: Cardiovascular Disease

## 2011-06-05 ENCOUNTER — Encounter (HOSPITAL_COMMUNITY)
Admission: RE | Admit: 2011-06-05 | Discharge: 2011-06-05 | Disposition: A | Discharge status: Home / Self Care | Payer: Medicare Other | Source: Ambulatory Visit | Attending: Cardiovascular Disease | Admitting: Cardiovascular Disease

## 2011-06-05 DIAGNOSIS — Z951 Presence of aortocoronary bypass graft: Secondary | ICD-10-CM | POA: Insufficient documentation

## 2011-06-05 DIAGNOSIS — I4891 Unspecified atrial fibrillation: Secondary | ICD-10-CM | POA: Insufficient documentation

## 2011-06-05 DIAGNOSIS — I209 Angina pectoris, unspecified: Secondary | ICD-10-CM | POA: Insufficient documentation

## 2011-06-05 DIAGNOSIS — I2510 Atherosclerotic heart disease of native coronary artery without angina pectoris: Secondary | ICD-10-CM | POA: Insufficient documentation

## 2011-06-05 DIAGNOSIS — Z7982 Long term (current) use of aspirin: Secondary | ICD-10-CM | POA: Insufficient documentation

## 2011-06-05 DIAGNOSIS — I10 Essential (primary) hypertension: Secondary | ICD-10-CM | POA: Insufficient documentation

## 2011-06-05 DIAGNOSIS — Z5189 Encounter for other specified aftercare: Secondary | ICD-10-CM | POA: Insufficient documentation

## 2011-06-05 DIAGNOSIS — Z79899 Other long term (current) drug therapy: Secondary | ICD-10-CM | POA: Insufficient documentation

## 2011-06-05 DIAGNOSIS — I2582 Chronic total occlusion of coronary artery: Secondary | ICD-10-CM | POA: Insufficient documentation

## 2011-06-05 DIAGNOSIS — F411 Generalized anxiety disorder: Secondary | ICD-10-CM | POA: Insufficient documentation

## 2011-06-05 DIAGNOSIS — Z8249 Family history of ischemic heart disease and other diseases of the circulatory system: Secondary | ICD-10-CM | POA: Insufficient documentation

## 2011-06-07 ENCOUNTER — Encounter (HOSPITAL_COMMUNITY)
Admission: RE | Admit: 2011-06-07 | Discharge: 2011-06-07 | Disposition: A | Discharge status: Home / Self Care | Payer: Medicare Other | Source: Ambulatory Visit | Attending: Cardiovascular Disease | Admitting: Cardiovascular Disease

## 2011-06-09 ENCOUNTER — Encounter (HOSPITAL_COMMUNITY)
Admission: RE | Admit: 2011-06-09 | Discharge: 2011-06-09 | Disposition: A | Discharge status: Home / Self Care | Payer: Medicare Other | Source: Ambulatory Visit | Attending: Cardiovascular Disease | Admitting: Cardiovascular Disease

## 2011-06-12 ENCOUNTER — Encounter (HOSPITAL_COMMUNITY)
Admission: RE | Admit: 2011-06-12 | Discharge: 2011-06-12 | Disposition: A | Discharge status: Home / Self Care | Payer: Medicare Other | Source: Ambulatory Visit | Attending: Cardiovascular Disease | Admitting: Cardiovascular Disease

## 2011-06-14 ENCOUNTER — Encounter (HOSPITAL_COMMUNITY)
Admission: RE | Admit: 2011-06-14 | Discharge: 2011-06-14 | Disposition: A | Discharge status: Home / Self Care | Payer: Medicare Other | Source: Ambulatory Visit | Attending: Cardiovascular Disease | Admitting: Cardiovascular Disease

## 2011-06-16 ENCOUNTER — Encounter (HOSPITAL_COMMUNITY)
Admission: RE | Admit: 2011-06-16 | Discharge: 2011-06-16 | Disposition: A | Discharge status: Home / Self Care | Payer: Medicare Other | Source: Ambulatory Visit | Attending: Cardiovascular Disease | Admitting: Cardiovascular Disease

## 2011-06-19 ENCOUNTER — Encounter (HOSPITAL_COMMUNITY)
Admission: RE | Admit: 2011-06-19 | Discharge: 2011-06-19 | Disposition: A | Discharge status: Home / Self Care | Payer: Medicare Other | Source: Ambulatory Visit | Attending: Cardiovascular Disease | Admitting: Cardiovascular Disease

## 2011-06-21 ENCOUNTER — Encounter (HOSPITAL_COMMUNITY)
Admission: RE | Admit: 2011-06-21 | Discharge: 2011-06-21 | Disposition: A | Discharge status: Home / Self Care | Payer: Medicare Other | Source: Ambulatory Visit | Attending: Cardiovascular Disease | Admitting: Cardiovascular Disease

## 2011-06-23 ENCOUNTER — Encounter (HOSPITAL_COMMUNITY)
Admission: RE | Admit: 2011-06-23 | Discharge: 2011-06-23 | Disposition: A | Discharge status: Home / Self Care | Payer: Medicare Other | Source: Ambulatory Visit | Attending: Cardiovascular Disease | Admitting: Cardiovascular Disease

## 2011-06-28 ENCOUNTER — Encounter (HOSPITAL_COMMUNITY): Payer: Medicare Other

## 2011-06-30 ENCOUNTER — Encounter (HOSPITAL_COMMUNITY): Payer: Medicare Other

## 2011-07-03 ENCOUNTER — Encounter (HOSPITAL_COMMUNITY): Payer: Medicare Other

## 2011-07-05 ENCOUNTER — Encounter (HOSPITAL_COMMUNITY): Payer: Medicare Other

## 2011-07-05 DIAGNOSIS — F411 Generalized anxiety disorder: Secondary | ICD-10-CM | POA: Insufficient documentation

## 2011-07-05 DIAGNOSIS — I2582 Chronic total occlusion of coronary artery: Secondary | ICD-10-CM | POA: Insufficient documentation

## 2011-07-05 DIAGNOSIS — I10 Essential (primary) hypertension: Secondary | ICD-10-CM | POA: Insufficient documentation

## 2011-07-05 DIAGNOSIS — Z951 Presence of aortocoronary bypass graft: Secondary | ICD-10-CM | POA: Insufficient documentation

## 2011-07-05 DIAGNOSIS — Z5189 Encounter for other specified aftercare: Secondary | ICD-10-CM | POA: Insufficient documentation

## 2011-07-05 DIAGNOSIS — Z79899 Other long term (current) drug therapy: Secondary | ICD-10-CM | POA: Insufficient documentation

## 2011-07-05 DIAGNOSIS — Z7982 Long term (current) use of aspirin: Secondary | ICD-10-CM | POA: Insufficient documentation

## 2011-07-05 DIAGNOSIS — I209 Angina pectoris, unspecified: Secondary | ICD-10-CM | POA: Insufficient documentation

## 2011-07-05 DIAGNOSIS — Z8249 Family history of ischemic heart disease and other diseases of the circulatory system: Secondary | ICD-10-CM | POA: Insufficient documentation

## 2011-07-05 DIAGNOSIS — I2510 Atherosclerotic heart disease of native coronary artery without angina pectoris: Secondary | ICD-10-CM | POA: Insufficient documentation

## 2011-07-05 DIAGNOSIS — I4891 Unspecified atrial fibrillation: Secondary | ICD-10-CM | POA: Insufficient documentation

## 2011-07-07 ENCOUNTER — Encounter (HOSPITAL_COMMUNITY)
Admission: RE | Admit: 2011-07-07 | Discharge: 2011-07-07 | Disposition: A | Discharge status: Home / Self Care | Payer: Medicare Other | Source: Ambulatory Visit | Attending: Cardiovascular Disease | Admitting: Cardiovascular Disease

## 2011-07-10 ENCOUNTER — Encounter (HOSPITAL_COMMUNITY)
Admission: RE | Admit: 2011-07-10 | Discharge: 2011-07-10 | Disposition: A | Discharge status: Home / Self Care | Payer: Medicare Other | Source: Ambulatory Visit | Attending: Cardiovascular Disease | Admitting: Cardiovascular Disease

## 2011-07-12 ENCOUNTER — Encounter (HOSPITAL_COMMUNITY)
Admission: RE | Admit: 2011-07-12 | Discharge: 2011-07-12 | Disposition: A | Discharge status: Home / Self Care | Payer: Medicare Other | Source: Ambulatory Visit | Attending: Cardiovascular Disease | Admitting: Cardiovascular Disease

## 2011-07-14 ENCOUNTER — Encounter (HOSPITAL_COMMUNITY)
Admission: RE | Admit: 2011-07-14 | Discharge: 2011-07-14 | Disposition: A | Discharge status: Home / Self Care | Payer: Medicare Other | Source: Ambulatory Visit | Attending: Cardiovascular Disease | Admitting: Cardiovascular Disease

## 2011-07-17 ENCOUNTER — Encounter (HOSPITAL_COMMUNITY)
Admission: RE | Admit: 2011-07-17 | Discharge: 2011-07-17 | Disposition: A | Discharge status: Home / Self Care | Payer: Medicare Other | Source: Ambulatory Visit | Attending: Cardiovascular Disease | Admitting: Cardiovascular Disease

## 2011-07-17 DIAGNOSIS — Z8249 Family history of ischemic heart disease and other diseases of the circulatory system: Secondary | ICD-10-CM | POA: Insufficient documentation

## 2011-07-17 DIAGNOSIS — Z951 Presence of aortocoronary bypass graft: Secondary | ICD-10-CM | POA: Insufficient documentation

## 2011-07-17 DIAGNOSIS — Z7982 Long term (current) use of aspirin: Secondary | ICD-10-CM | POA: Insufficient documentation

## 2011-07-17 DIAGNOSIS — I2582 Chronic total occlusion of coronary artery: Secondary | ICD-10-CM | POA: Insufficient documentation

## 2011-07-17 DIAGNOSIS — Z5189 Encounter for other specified aftercare: Secondary | ICD-10-CM | POA: Insufficient documentation

## 2011-07-17 DIAGNOSIS — I2510 Atherosclerotic heart disease of native coronary artery without angina pectoris: Secondary | ICD-10-CM | POA: Insufficient documentation

## 2011-07-17 DIAGNOSIS — I4891 Unspecified atrial fibrillation: Secondary | ICD-10-CM | POA: Insufficient documentation

## 2011-07-17 DIAGNOSIS — Z79899 Other long term (current) drug therapy: Secondary | ICD-10-CM | POA: Insufficient documentation

## 2011-07-17 DIAGNOSIS — F411 Generalized anxiety disorder: Secondary | ICD-10-CM | POA: Insufficient documentation

## 2011-07-17 DIAGNOSIS — I10 Essential (primary) hypertension: Secondary | ICD-10-CM | POA: Insufficient documentation

## 2011-07-17 DIAGNOSIS — I209 Angina pectoris, unspecified: Secondary | ICD-10-CM | POA: Insufficient documentation

## 2011-07-19 ENCOUNTER — Encounter (HOSPITAL_COMMUNITY): Payer: Medicare Other

## 2011-07-19 ENCOUNTER — Encounter (HOSPITAL_COMMUNITY)
Admission: RE | Admit: 2011-07-19 | Discharge: 2011-07-19 | Disposition: A | Discharge status: Home / Self Care | Payer: Medicare Other | Source: Ambulatory Visit | Attending: Cardiovascular Disease | Admitting: Cardiovascular Disease

## 2011-07-21 ENCOUNTER — Encounter (HOSPITAL_COMMUNITY): Payer: Medicare Other

## 2011-07-21 NOTE — Progress Notes (Signed)
Cardiac Rehabilitation Program Progress Report   Orientation:  04/06/2011 Graduate Date:  07/14/2011 # of sessions completed: 36/36  Cardiologist: Rubbie Battiest MD:  Carley Hammed Time:  1115  A.  Exercise Program:  Tolerates exercise @ 4.1 METS for 30 minutes, Bike Test Results:  Pre: 0.96 miles and Post: 1.16 miles, Improved functional capacity  20.8 %, Improved  muscular strength  33.3 %, Decreased  flexibility 25 % (pt had stopped doing his stretches at home while in program), No Change education score  %, Progressed to Phase 4 maintenance program and Discharged to home exercise program.  Anticipated compliance:  excellent  B.  Mental Health:  Good mental attitude and Quality of Life (QOL)  improvements:  Overall  19.8 %, Health/Functioning 34.2 %, Socioeconomics 7.3 %, Psych/Spiritual 0 %, Family 29.4 %    C.  Education/Instruction/Skills  Accurately checks own pulse.  Rest:  48  Exercise:  120, Knows THR for exercise, Uses Perceived Exertion Scale and/or Dyspnea Scale and Attended 13/13 education classes  Home exercise given: 04/24/2011  D.  Nutrition/Weight Control/Body Composition:  BMI 29.8 % Body Fat  29.1, Patient has lost 2.6 kg and Evidence of fat weight loss 4.3%  *This section completed by Mickle Plumb, Andres Shad, RD, LDN, CDE  E.  Blood Lipids    No results found for this basename: CHOL     No results found for this basename: TRIG     No results found for this basename: HDL     No results found for this basename: CHOLHDL     No results found for this basename: LDLDIRECT      F.  Lifestyle Changes:  Making positive lifestyle changes  G.  Symptoms noted with exercise:  Asymptomatic, although pt did have some ectopy as well as bradycardia on his last visit.  Report Completed By:  Hazle Nordmann   Comments:  Pt did well while in program, progressing from 2.9 METs to 4.1 METs.  Pt has continued into the Maintenance program for  exercise as well as walking at home.  At discharge pt was sinus with bradycardia at cool down.  Thanks for the referral. Fabio Pierce, MA, ACSM RCEP

## 2011-07-24 ENCOUNTER — Encounter (HOSPITAL_COMMUNITY)
Admission: RE | Admit: 2011-07-24 | Discharge: 2011-07-24 | Disposition: A | Discharge status: Home / Self Care | Payer: Medicare Other | Source: Ambulatory Visit | Attending: Cardiovascular Disease | Admitting: Cardiovascular Disease

## 2011-07-24 ENCOUNTER — Encounter (HOSPITAL_COMMUNITY): Payer: Medicare Other

## 2011-07-26 ENCOUNTER — Encounter (HOSPITAL_COMMUNITY)
Admission: RE | Admit: 2011-07-26 | Discharge: 2011-07-26 | Disposition: A | Discharge status: Home / Self Care | Payer: Medicare Other | Source: Ambulatory Visit | Attending: Cardiovascular Disease | Admitting: Cardiovascular Disease

## 2011-07-26 ENCOUNTER — Encounter (HOSPITAL_COMMUNITY): Payer: Medicare Other

## 2011-07-26 NOTE — Progress Notes (Signed)
Pt saw Dr Tresa Endo and had some medication changes to report. Pt's Diovan/HCTZ was increased to 320-25 mg daily, Amiodarone was decreased to 100 mg daily, and Coumadin was changed to 7.5 mg on Thursdays and still 5 mg the rest of the week.

## 2011-07-27 NOTE — Progress Notes (Signed)
Cardiac Rehab Discharge Progress note:  I agree with the assessment of this patient. Thank you for the referral of this delightful patient.  Karlene Lineman RN

## 2011-07-28 ENCOUNTER — Encounter (HOSPITAL_COMMUNITY): Payer: Medicare Other

## 2011-07-28 ENCOUNTER — Encounter (HOSPITAL_COMMUNITY)
Admission: RE | Admit: 2011-07-28 | Discharge: 2011-07-28 | Disposition: A | Discharge status: Home / Self Care | Payer: Medicare Other | Source: Ambulatory Visit | Attending: Cardiovascular Disease | Admitting: Cardiovascular Disease

## 2011-07-31 ENCOUNTER — Encounter (HOSPITAL_COMMUNITY)
Admission: RE | Admit: 2011-07-31 | Discharge: 2011-07-31 | Disposition: A | Discharge status: Home / Self Care | Payer: Medicare Other | Source: Ambulatory Visit | Attending: Cardiovascular Disease | Admitting: Cardiovascular Disease

## 2011-07-31 ENCOUNTER — Encounter (HOSPITAL_COMMUNITY): Payer: Medicare Other

## 2011-08-02 ENCOUNTER — Encounter (HOSPITAL_COMMUNITY)
Admission: RE | Admit: 2011-08-02 | Discharge: 2011-08-02 | Disposition: A | Discharge status: Home / Self Care | Payer: Medicare Other | Source: Ambulatory Visit | Attending: Cardiovascular Disease | Admitting: Cardiovascular Disease

## 2011-08-02 ENCOUNTER — Encounter (HOSPITAL_COMMUNITY): Payer: Medicare Other

## 2011-08-04 ENCOUNTER — Encounter (HOSPITAL_COMMUNITY): Payer: Medicare Other

## 2011-08-04 ENCOUNTER — Encounter (HOSPITAL_COMMUNITY)
Admission: RE | Admit: 2011-08-04 | Discharge: 2011-08-04 | Disposition: A | Discharge status: Home / Self Care | Payer: Medicare Other | Source: Ambulatory Visit | Attending: Cardiovascular Disease | Admitting: Cardiovascular Disease

## 2011-08-04 DIAGNOSIS — Z7982 Long term (current) use of aspirin: Secondary | ICD-10-CM | POA: Insufficient documentation

## 2011-08-04 DIAGNOSIS — Z79899 Other long term (current) drug therapy: Secondary | ICD-10-CM | POA: Insufficient documentation

## 2011-08-04 DIAGNOSIS — I2582 Chronic total occlusion of coronary artery: Secondary | ICD-10-CM | POA: Insufficient documentation

## 2011-08-04 DIAGNOSIS — Z8249 Family history of ischemic heart disease and other diseases of the circulatory system: Secondary | ICD-10-CM | POA: Insufficient documentation

## 2011-08-04 DIAGNOSIS — F411 Generalized anxiety disorder: Secondary | ICD-10-CM | POA: Insufficient documentation

## 2011-08-04 DIAGNOSIS — I10 Essential (primary) hypertension: Secondary | ICD-10-CM | POA: Insufficient documentation

## 2011-08-04 DIAGNOSIS — I2510 Atherosclerotic heart disease of native coronary artery without angina pectoris: Secondary | ICD-10-CM | POA: Insufficient documentation

## 2011-08-04 DIAGNOSIS — Z951 Presence of aortocoronary bypass graft: Secondary | ICD-10-CM | POA: Insufficient documentation

## 2011-08-04 DIAGNOSIS — I209 Angina pectoris, unspecified: Secondary | ICD-10-CM | POA: Insufficient documentation

## 2011-08-04 DIAGNOSIS — I4891 Unspecified atrial fibrillation: Secondary | ICD-10-CM | POA: Insufficient documentation

## 2011-08-04 DIAGNOSIS — Z5189 Encounter for other specified aftercare: Secondary | ICD-10-CM | POA: Insufficient documentation

## 2011-08-07 ENCOUNTER — Encounter (HOSPITAL_COMMUNITY)
Admission: RE | Admit: 2011-08-07 | Discharge: 2011-08-07 | Disposition: A | Discharge status: Home / Self Care | Payer: Medicare Other | Source: Ambulatory Visit | Attending: Cardiovascular Disease | Admitting: Cardiovascular Disease

## 2011-08-07 ENCOUNTER — Encounter (HOSPITAL_COMMUNITY): Payer: Medicare Other

## 2011-08-09 ENCOUNTER — Encounter (HOSPITAL_COMMUNITY)
Admission: RE | Admit: 2011-08-09 | Discharge: 2011-08-09 | Disposition: A | Discharge status: Home / Self Care | Payer: Medicare Other | Source: Ambulatory Visit | Attending: Cardiovascular Disease | Admitting: Cardiovascular Disease

## 2011-08-09 ENCOUNTER — Encounter (HOSPITAL_COMMUNITY): Payer: Medicare Other

## 2011-08-11 ENCOUNTER — Encounter (HOSPITAL_COMMUNITY)
Admission: RE | Admit: 2011-08-11 | Discharge: 2011-08-11 | Disposition: A | Discharge status: Home / Self Care | Payer: Medicare Other | Source: Ambulatory Visit | Attending: Cardiovascular Disease | Admitting: Cardiovascular Disease

## 2011-08-11 ENCOUNTER — Encounter (HOSPITAL_COMMUNITY): Payer: Medicare Other

## 2011-08-11 NOTE — Progress Notes (Signed)
0945- Pt's started on Citalopram (Celexa) 20 mg daily for anxiety.

## 2011-08-14 ENCOUNTER — Encounter (HOSPITAL_COMMUNITY): Payer: Medicare Other

## 2011-08-14 ENCOUNTER — Encounter (HOSPITAL_COMMUNITY)
Admission: RE | Admit: 2011-08-14 | Discharge: 2011-08-14 | Disposition: A | Discharge status: Home / Self Care | Payer: Medicare Other | Source: Ambulatory Visit | Attending: Cardiovascular Disease | Admitting: Cardiovascular Disease

## 2011-08-16 ENCOUNTER — Encounter (HOSPITAL_COMMUNITY): Payer: Medicare Other

## 2011-08-16 ENCOUNTER — Encounter (HOSPITAL_COMMUNITY)
Admission: RE | Admit: 2011-08-16 | Discharge: 2011-08-16 | Disposition: A | Discharge status: Home / Self Care | Payer: Medicare Other | Source: Ambulatory Visit | Attending: Cardiovascular Disease | Admitting: Cardiovascular Disease

## 2011-08-18 ENCOUNTER — Encounter (HOSPITAL_COMMUNITY)
Admission: RE | Admit: 2011-08-18 | Discharge: 2011-08-18 | Disposition: A | Discharge status: Home / Self Care | Payer: Medicare Other | Source: Ambulatory Visit | Attending: Cardiovascular Disease | Admitting: Cardiovascular Disease

## 2011-08-18 ENCOUNTER — Encounter (HOSPITAL_COMMUNITY): Payer: Medicare Other

## 2011-08-21 ENCOUNTER — Encounter (HOSPITAL_COMMUNITY): Payer: Medicare Other

## 2011-08-21 ENCOUNTER — Encounter (HOSPITAL_COMMUNITY)
Admission: RE | Admit: 2011-08-21 | Discharge: 2011-08-21 | Disposition: A | Discharge status: Home / Self Care | Payer: Medicare Other | Source: Ambulatory Visit | Attending: Cardiovascular Disease | Admitting: Cardiovascular Disease

## 2011-08-23 ENCOUNTER — Encounter (HOSPITAL_COMMUNITY)
Admission: RE | Admit: 2011-08-23 | Discharge: 2011-08-23 | Disposition: A | Discharge status: Home / Self Care | Payer: Medicare Other | Source: Ambulatory Visit | Attending: Cardiovascular Disease | Admitting: Cardiovascular Disease

## 2011-08-23 ENCOUNTER — Encounter (HOSPITAL_COMMUNITY): Payer: Medicare Other

## 2011-08-25 ENCOUNTER — Encounter (HOSPITAL_COMMUNITY): Payer: Medicare Other

## 2011-08-28 ENCOUNTER — Encounter (HOSPITAL_COMMUNITY)
Admission: RE | Admit: 2011-08-28 | Discharge: 2011-08-28 | Disposition: A | Discharge status: Home / Self Care | Payer: Medicare Other | Source: Ambulatory Visit | Attending: Cardiovascular Disease | Admitting: Cardiovascular Disease

## 2011-08-28 ENCOUNTER — Encounter (HOSPITAL_COMMUNITY): Payer: Medicare Other

## 2011-08-30 ENCOUNTER — Encounter (HOSPITAL_COMMUNITY): Payer: Medicare Other

## 2011-08-30 ENCOUNTER — Encounter (HOSPITAL_COMMUNITY)
Admission: RE | Admit: 2011-08-30 | Discharge: 2011-08-30 | Disposition: A | Discharge status: Home / Self Care | Payer: Medicare Other | Source: Ambulatory Visit | Attending: Cardiovascular Disease | Admitting: Cardiovascular Disease

## 2011-08-31 ENCOUNTER — Other Ambulatory Visit: Payer: Self-pay | Admitting: Dermatology

## 2011-09-01 ENCOUNTER — Encounter (HOSPITAL_COMMUNITY)
Admission: RE | Admit: 2011-09-01 | Discharge: 2011-09-01 | Disposition: A | Discharge status: Home / Self Care | Payer: Medicare Other | Source: Ambulatory Visit | Attending: Cardiovascular Disease | Admitting: Cardiovascular Disease

## 2011-09-01 ENCOUNTER — Encounter (HOSPITAL_COMMUNITY): Payer: Medicare Other

## 2011-09-01 DIAGNOSIS — Z79899 Other long term (current) drug therapy: Secondary | ICD-10-CM | POA: Insufficient documentation

## 2011-09-01 DIAGNOSIS — Z951 Presence of aortocoronary bypass graft: Secondary | ICD-10-CM | POA: Insufficient documentation

## 2011-09-01 DIAGNOSIS — I4891 Unspecified atrial fibrillation: Secondary | ICD-10-CM | POA: Insufficient documentation

## 2011-09-01 DIAGNOSIS — Z7982 Long term (current) use of aspirin: Secondary | ICD-10-CM | POA: Insufficient documentation

## 2011-09-01 DIAGNOSIS — F411 Generalized anxiety disorder: Secondary | ICD-10-CM | POA: Insufficient documentation

## 2011-09-01 DIAGNOSIS — I10 Essential (primary) hypertension: Secondary | ICD-10-CM | POA: Insufficient documentation

## 2011-09-01 DIAGNOSIS — I2582 Chronic total occlusion of coronary artery: Secondary | ICD-10-CM | POA: Insufficient documentation

## 2011-09-01 DIAGNOSIS — Z8249 Family history of ischemic heart disease and other diseases of the circulatory system: Secondary | ICD-10-CM | POA: Insufficient documentation

## 2011-09-01 DIAGNOSIS — I209 Angina pectoris, unspecified: Secondary | ICD-10-CM | POA: Insufficient documentation

## 2011-09-01 DIAGNOSIS — Z5189 Encounter for other specified aftercare: Secondary | ICD-10-CM | POA: Insufficient documentation

## 2011-09-01 DIAGNOSIS — I2510 Atherosclerotic heart disease of native coronary artery without angina pectoris: Secondary | ICD-10-CM | POA: Insufficient documentation

## 2011-09-04 ENCOUNTER — Encounter (HOSPITAL_COMMUNITY)
Admission: RE | Admit: 2011-09-04 | Discharge: 2011-09-04 | Disposition: A | Discharge status: Home / Self Care | Payer: Medicare Other | Source: Ambulatory Visit | Attending: Cardiovascular Disease | Admitting: Cardiovascular Disease

## 2011-09-04 ENCOUNTER — Encounter (HOSPITAL_COMMUNITY): Payer: Medicare Other

## 2011-09-06 ENCOUNTER — Encounter (HOSPITAL_COMMUNITY): Payer: Medicare Other

## 2011-09-06 ENCOUNTER — Encounter (HOSPITAL_COMMUNITY)
Admission: RE | Admit: 2011-09-06 | Discharge: 2011-09-06 | Disposition: A | Discharge status: Home / Self Care | Payer: Medicare Other | Source: Ambulatory Visit | Attending: Cardiovascular Disease | Admitting: Cardiovascular Disease

## 2011-09-08 ENCOUNTER — Encounter (HOSPITAL_COMMUNITY): Payer: Medicare Other

## 2011-09-08 ENCOUNTER — Encounter (HOSPITAL_COMMUNITY)
Admission: RE | Admit: 2011-09-08 | Discharge: 2011-09-08 | Disposition: A | Discharge status: Home / Self Care | Payer: Medicare Other | Source: Ambulatory Visit | Attending: Cardiovascular Disease | Admitting: Cardiovascular Disease

## 2011-09-08 NOTE — Progress Notes (Signed)
Per pt., Amiodarone d/c'd.

## 2011-09-11 ENCOUNTER — Encounter (HOSPITAL_COMMUNITY): Payer: Medicare Other

## 2011-09-11 ENCOUNTER — Encounter (HOSPITAL_COMMUNITY)
Admission: RE | Admit: 2011-09-11 | Discharge: 2011-09-11 | Disposition: A | Discharge status: Home / Self Care | Payer: Medicare Other | Source: Ambulatory Visit | Attending: Cardiovascular Disease | Admitting: Cardiovascular Disease

## 2011-09-13 ENCOUNTER — Encounter (HOSPITAL_COMMUNITY)
Admission: RE | Admit: 2011-09-13 | Discharge: 2011-09-13 | Disposition: A | Discharge status: Home / Self Care | Payer: Medicare Other | Source: Ambulatory Visit | Attending: Cardiovascular Disease | Admitting: Cardiovascular Disease

## 2011-09-13 ENCOUNTER — Encounter (HOSPITAL_COMMUNITY): Payer: Medicare Other

## 2011-09-15 ENCOUNTER — Encounter (HOSPITAL_COMMUNITY): Payer: Medicare Other

## 2011-09-15 ENCOUNTER — Encounter (HOSPITAL_COMMUNITY)
Admission: RE | Admit: 2011-09-15 | Discharge: 2011-09-15 | Disposition: A | Discharge status: Home / Self Care | Payer: Medicare Other | Source: Ambulatory Visit | Attending: Cardiovascular Disease | Admitting: Cardiovascular Disease

## 2011-09-18 ENCOUNTER — Encounter (HOSPITAL_COMMUNITY): Payer: Medicare Other

## 2011-09-18 ENCOUNTER — Encounter (HOSPITAL_COMMUNITY)
Admission: RE | Admit: 2011-09-18 | Discharge: 2011-09-18 | Disposition: A | Discharge status: Home / Self Care | Payer: Medicare Other | Source: Ambulatory Visit | Attending: Cardiovascular Disease | Admitting: Cardiovascular Disease

## 2011-09-20 ENCOUNTER — Encounter (HOSPITAL_COMMUNITY)
Admission: RE | Admit: 2011-09-20 | Discharge: 2011-09-20 | Disposition: A | Discharge status: Home / Self Care | Payer: Medicare Other | Source: Ambulatory Visit | Attending: Cardiovascular Disease | Admitting: Cardiovascular Disease

## 2011-09-20 ENCOUNTER — Encounter (HOSPITAL_COMMUNITY): Payer: Medicare Other

## 2011-09-21 ENCOUNTER — Encounter (INDEPENDENT_AMBULATORY_CARE_PROVIDER_SITE_OTHER): Payer: Medicare Other | Admitting: Ophthalmology

## 2011-09-21 DIAGNOSIS — H2510 Age-related nuclear cataract, unspecified eye: Secondary | ICD-10-CM

## 2011-09-21 DIAGNOSIS — H43819 Vitreous degeneration, unspecified eye: Secondary | ICD-10-CM

## 2011-09-21 DIAGNOSIS — I10 Essential (primary) hypertension: Secondary | ICD-10-CM

## 2011-09-21 DIAGNOSIS — H35379 Puckering of macula, unspecified eye: Secondary | ICD-10-CM

## 2011-09-21 DIAGNOSIS — H35039 Hypertensive retinopathy, unspecified eye: Secondary | ICD-10-CM

## 2011-09-21 DIAGNOSIS — H35349 Macular cyst, hole, or pseudohole, unspecified eye: Secondary | ICD-10-CM

## 2011-09-22 ENCOUNTER — Encounter (HOSPITAL_COMMUNITY): Payer: Medicare Other

## 2011-09-22 ENCOUNTER — Encounter (HOSPITAL_COMMUNITY)
Admission: RE | Admit: 2011-09-22 | Discharge: 2011-09-22 | Disposition: A | Discharge status: Home / Self Care | Payer: Medicare Other | Source: Ambulatory Visit | Attending: Cardiovascular Disease | Admitting: Cardiovascular Disease

## 2011-09-25 ENCOUNTER — Encounter (HOSPITAL_COMMUNITY)
Admission: RE | Admit: 2011-09-25 | Discharge: 2011-09-25 | Disposition: A | Discharge status: Home / Self Care | Payer: Medicare Other | Source: Ambulatory Visit | Attending: Cardiovascular Disease | Admitting: Cardiovascular Disease

## 2011-09-25 ENCOUNTER — Encounter (HOSPITAL_COMMUNITY): Payer: Medicare Other

## 2011-09-27 ENCOUNTER — Encounter (HOSPITAL_COMMUNITY): Payer: Medicare Other

## 2011-09-27 ENCOUNTER — Encounter (HOSPITAL_COMMUNITY)
Admission: RE | Admit: 2011-09-27 | Discharge: 2011-09-27 | Disposition: A | Discharge status: Home / Self Care | Payer: Medicare Other | Source: Ambulatory Visit | Attending: Cardiovascular Disease | Admitting: Cardiovascular Disease

## 2011-09-29 ENCOUNTER — Encounter (HOSPITAL_COMMUNITY): Payer: Medicare Other

## 2011-09-29 ENCOUNTER — Encounter (HOSPITAL_COMMUNITY)
Admission: RE | Admit: 2011-09-29 | Discharge: 2011-09-29 | Disposition: A | Discharge status: Home / Self Care | Payer: Medicare Other | Source: Ambulatory Visit | Attending: Cardiovascular Disease | Admitting: Cardiovascular Disease

## 2011-10-02 ENCOUNTER — Encounter (HOSPITAL_COMMUNITY): Payer: Medicare Other

## 2011-10-02 ENCOUNTER — Encounter (HOSPITAL_COMMUNITY)
Admission: RE | Admit: 2011-10-02 | Discharge: 2011-10-02 | Disposition: A | Discharge status: Home / Self Care | Payer: Medicare Other | Source: Ambulatory Visit | Attending: Cardiovascular Disease | Admitting: Cardiovascular Disease

## 2011-10-02 DIAGNOSIS — I209 Angina pectoris, unspecified: Secondary | ICD-10-CM | POA: Insufficient documentation

## 2011-10-02 DIAGNOSIS — I10 Essential (primary) hypertension: Secondary | ICD-10-CM | POA: Insufficient documentation

## 2011-10-02 DIAGNOSIS — Z7982 Long term (current) use of aspirin: Secondary | ICD-10-CM | POA: Insufficient documentation

## 2011-10-02 DIAGNOSIS — I2510 Atherosclerotic heart disease of native coronary artery without angina pectoris: Secondary | ICD-10-CM | POA: Insufficient documentation

## 2011-10-02 DIAGNOSIS — Z79899 Other long term (current) drug therapy: Secondary | ICD-10-CM | POA: Insufficient documentation

## 2011-10-02 DIAGNOSIS — Z951 Presence of aortocoronary bypass graft: Secondary | ICD-10-CM | POA: Insufficient documentation

## 2011-10-02 DIAGNOSIS — F411 Generalized anxiety disorder: Secondary | ICD-10-CM | POA: Insufficient documentation

## 2011-10-02 DIAGNOSIS — I2582 Chronic total occlusion of coronary artery: Secondary | ICD-10-CM | POA: Insufficient documentation

## 2011-10-02 DIAGNOSIS — Z8249 Family history of ischemic heart disease and other diseases of the circulatory system: Secondary | ICD-10-CM | POA: Insufficient documentation

## 2011-10-02 DIAGNOSIS — Z5189 Encounter for other specified aftercare: Secondary | ICD-10-CM | POA: Insufficient documentation

## 2011-10-02 DIAGNOSIS — I4891 Unspecified atrial fibrillation: Secondary | ICD-10-CM | POA: Insufficient documentation

## 2011-10-04 ENCOUNTER — Encounter (HOSPITAL_COMMUNITY): Payer: Medicare Other

## 2011-10-04 ENCOUNTER — Encounter (HOSPITAL_COMMUNITY)
Admission: RE | Admit: 2011-10-04 | Discharge: 2011-10-04 | Disposition: A | Discharge status: Home / Self Care | Payer: Medicare Other | Source: Ambulatory Visit | Attending: Cardiovascular Disease | Admitting: Cardiovascular Disease

## 2011-10-06 ENCOUNTER — Encounter (HOSPITAL_COMMUNITY): Payer: Medicare Other

## 2011-10-06 ENCOUNTER — Encounter (HOSPITAL_COMMUNITY)
Admission: RE | Admit: 2011-10-06 | Discharge: 2011-10-06 | Disposition: A | Discharge status: Home / Self Care | Payer: Medicare Other | Source: Ambulatory Visit | Attending: Cardiovascular Disease | Admitting: Cardiovascular Disease

## 2011-10-09 ENCOUNTER — Encounter (HOSPITAL_COMMUNITY)
Admission: RE | Admit: 2011-10-09 | Discharge: 2011-10-09 | Disposition: A | Discharge status: Home / Self Care | Payer: Medicare Other | Source: Ambulatory Visit | Attending: Cardiovascular Disease | Admitting: Cardiovascular Disease

## 2011-10-09 ENCOUNTER — Encounter (HOSPITAL_COMMUNITY): Payer: Medicare Other

## 2011-10-11 ENCOUNTER — Encounter (HOSPITAL_COMMUNITY): Payer: Medicare Other

## 2011-10-11 ENCOUNTER — Encounter (HOSPITAL_COMMUNITY)
Admission: RE | Admit: 2011-10-11 | Discharge: 2011-10-11 | Disposition: A | Discharge status: Home / Self Care | Payer: Medicare Other | Source: Ambulatory Visit | Attending: Cardiovascular Disease | Admitting: Cardiovascular Disease

## 2011-10-12 ENCOUNTER — Encounter (INDEPENDENT_AMBULATORY_CARE_PROVIDER_SITE_OTHER): Payer: Medicare Other | Admitting: Ophthalmology

## 2011-10-12 DIAGNOSIS — H2700 Aphakia, unspecified eye: Secondary | ICD-10-CM

## 2011-10-12 DIAGNOSIS — H35349 Macular cyst, hole, or pseudohole, unspecified eye: Secondary | ICD-10-CM

## 2011-10-13 ENCOUNTER — Encounter (HOSPITAL_COMMUNITY)
Admission: RE | Admit: 2011-10-13 | Discharge: 2011-10-13 | Disposition: A | Discharge status: Home / Self Care | Payer: Medicare Other | Source: Ambulatory Visit | Attending: Cardiovascular Disease | Admitting: Cardiovascular Disease

## 2011-10-13 ENCOUNTER — Encounter (HOSPITAL_COMMUNITY): Payer: Medicare Other

## 2011-10-16 ENCOUNTER — Encounter (HOSPITAL_COMMUNITY): Payer: Medicare Other

## 2011-10-16 ENCOUNTER — Encounter (HOSPITAL_COMMUNITY)
Admission: RE | Admit: 2011-10-16 | Discharge: 2011-10-16 | Disposition: A | Discharge status: Home / Self Care | Payer: Medicare Other | Source: Ambulatory Visit | Attending: Cardiovascular Disease | Admitting: Cardiovascular Disease

## 2011-10-18 ENCOUNTER — Encounter (HOSPITAL_COMMUNITY): Payer: Medicare Other

## 2011-10-18 ENCOUNTER — Encounter (HOSPITAL_COMMUNITY)
Admission: RE | Admit: 2011-10-18 | Discharge: 2011-10-18 | Disposition: A | Discharge status: Home / Self Care | Payer: Medicare Other | Source: Ambulatory Visit | Attending: Cardiovascular Disease | Admitting: Cardiovascular Disease

## 2011-10-20 ENCOUNTER — Encounter (HOSPITAL_COMMUNITY)
Admission: RE | Admit: 2011-10-20 | Discharge: 2011-10-20 | Disposition: A | Discharge status: Home / Self Care | Payer: Medicare Other | Source: Ambulatory Visit | Attending: Cardiovascular Disease | Admitting: Cardiovascular Disease

## 2011-10-20 ENCOUNTER — Encounter (HOSPITAL_COMMUNITY): Payer: Medicare Other

## 2011-10-23 ENCOUNTER — Encounter (HOSPITAL_COMMUNITY)
Admission: RE | Admit: 2011-10-23 | Discharge: 2011-10-23 | Disposition: A | Discharge status: Home / Self Care | Payer: Medicare Other | Source: Ambulatory Visit | Attending: Cardiovascular Disease | Admitting: Cardiovascular Disease

## 2011-10-23 ENCOUNTER — Encounter (HOSPITAL_COMMUNITY): Payer: Medicare Other

## 2011-10-25 ENCOUNTER — Encounter (HOSPITAL_COMMUNITY): Payer: Medicare Other

## 2011-10-25 ENCOUNTER — Encounter (HOSPITAL_COMMUNITY)
Admission: RE | Admit: 2011-10-25 | Discharge: 2011-10-25 | Disposition: A | Discharge status: Home / Self Care | Payer: Medicare Other | Source: Ambulatory Visit | Attending: Cardiovascular Disease | Admitting: Cardiovascular Disease

## 2011-10-27 ENCOUNTER — Encounter (HOSPITAL_COMMUNITY)
Admission: RE | Admit: 2011-10-27 | Discharge: 2011-10-27 | Disposition: A | Discharge status: Home / Self Care | Payer: Medicare Other | Source: Ambulatory Visit | Attending: Cardiovascular Disease | Admitting: Cardiovascular Disease

## 2011-10-27 ENCOUNTER — Encounter (HOSPITAL_COMMUNITY): Payer: Medicare Other

## 2011-10-30 ENCOUNTER — Encounter (HOSPITAL_COMMUNITY): Payer: Medicare Other

## 2011-10-30 ENCOUNTER — Encounter (HOSPITAL_COMMUNITY)
Admission: RE | Admit: 2011-10-30 | Discharge: 2011-10-30 | Disposition: A | Discharge status: Home / Self Care | Payer: Medicare Other | Source: Ambulatory Visit | Attending: Cardiovascular Disease | Admitting: Cardiovascular Disease

## 2011-11-01 ENCOUNTER — Encounter (HOSPITAL_COMMUNITY)
Admission: RE | Admit: 2011-11-01 | Discharge: 2011-11-01 | Disposition: A | Discharge status: Home / Self Care | Payer: Medicare Other | Source: Ambulatory Visit | Attending: Cardiovascular Disease | Admitting: Cardiovascular Disease

## 2011-11-01 ENCOUNTER — Encounter (HOSPITAL_COMMUNITY): Payer: Medicare Other

## 2011-11-01 DIAGNOSIS — Z8249 Family history of ischemic heart disease and other diseases of the circulatory system: Secondary | ICD-10-CM | POA: Insufficient documentation

## 2011-11-01 DIAGNOSIS — I2510 Atherosclerotic heart disease of native coronary artery without angina pectoris: Secondary | ICD-10-CM | POA: Insufficient documentation

## 2011-11-01 DIAGNOSIS — I4891 Unspecified atrial fibrillation: Secondary | ICD-10-CM | POA: Insufficient documentation

## 2011-11-01 DIAGNOSIS — I10 Essential (primary) hypertension: Secondary | ICD-10-CM | POA: Insufficient documentation

## 2011-11-01 DIAGNOSIS — I209 Angina pectoris, unspecified: Secondary | ICD-10-CM | POA: Insufficient documentation

## 2011-11-01 DIAGNOSIS — Z79899 Other long term (current) drug therapy: Secondary | ICD-10-CM | POA: Insufficient documentation

## 2011-11-01 DIAGNOSIS — F411 Generalized anxiety disorder: Secondary | ICD-10-CM | POA: Insufficient documentation

## 2011-11-01 DIAGNOSIS — Z7982 Long term (current) use of aspirin: Secondary | ICD-10-CM | POA: Insufficient documentation

## 2011-11-01 DIAGNOSIS — Z5189 Encounter for other specified aftercare: Secondary | ICD-10-CM | POA: Insufficient documentation

## 2011-11-01 DIAGNOSIS — Z951 Presence of aortocoronary bypass graft: Secondary | ICD-10-CM | POA: Insufficient documentation

## 2011-11-01 DIAGNOSIS — I2582 Chronic total occlusion of coronary artery: Secondary | ICD-10-CM | POA: Insufficient documentation

## 2011-11-03 ENCOUNTER — Encounter (HOSPITAL_COMMUNITY): Payer: Medicare Other

## 2011-11-03 ENCOUNTER — Encounter (HOSPITAL_COMMUNITY)
Admission: RE | Admit: 2011-11-03 | Discharge: 2011-11-03 | Disposition: A | Discharge status: Home / Self Care | Payer: Medicare Other | Source: Ambulatory Visit | Attending: Cardiovascular Disease | Admitting: Cardiovascular Disease

## 2011-11-06 ENCOUNTER — Encounter (HOSPITAL_COMMUNITY)
Admission: RE | Admit: 2011-11-06 | Discharge: 2011-11-06 | Disposition: A | Discharge status: Home / Self Care | Payer: Medicare Other | Source: Ambulatory Visit | Attending: Cardiovascular Disease | Admitting: Cardiovascular Disease

## 2011-11-06 ENCOUNTER — Encounter (HOSPITAL_COMMUNITY): Payer: Medicare Other

## 2011-11-08 ENCOUNTER — Encounter (HOSPITAL_COMMUNITY): Payer: Medicare Other

## 2011-11-08 ENCOUNTER — Encounter (HOSPITAL_COMMUNITY)
Admission: RE | Admit: 2011-11-08 | Discharge: 2011-11-08 | Disposition: A | Discharge status: Home / Self Care | Payer: Medicare Other | Source: Ambulatory Visit | Attending: Cardiovascular Disease | Admitting: Cardiovascular Disease

## 2011-11-10 ENCOUNTER — Encounter (HOSPITAL_COMMUNITY)
Admission: RE | Admit: 2011-11-10 | Discharge: 2011-11-10 | Disposition: A | Discharge status: Home / Self Care | Payer: Medicare Other | Source: Ambulatory Visit | Attending: Cardiovascular Disease | Admitting: Cardiovascular Disease

## 2011-11-10 ENCOUNTER — Encounter (HOSPITAL_COMMUNITY): Payer: Medicare Other

## 2011-11-13 ENCOUNTER — Encounter (HOSPITAL_COMMUNITY): Payer: Medicare Other

## 2011-11-13 ENCOUNTER — Encounter (HOSPITAL_COMMUNITY): Payer: Self-pay | Admitting: Emergency Medicine

## 2011-11-13 ENCOUNTER — Emergency Department (HOSPITAL_COMMUNITY)
Admission: EM | Admit: 2011-11-13 | Discharge: 2011-11-13 | Disposition: A | Discharge status: Home / Self Care | Payer: Medicare Other | Attending: Emergency Medicine | Admitting: Emergency Medicine

## 2011-11-13 ENCOUNTER — Emergency Department (HOSPITAL_COMMUNITY): Payer: Medicare Other

## 2011-11-13 ENCOUNTER — Emergency Department (INDEPENDENT_AMBULATORY_CARE_PROVIDER_SITE_OTHER)
Admission: EM | Admit: 2011-11-13 | Discharge: 2011-11-13 | Disposition: A | Discharge status: Other Acute Inpatient Hospital | Payer: Medicare Other | Source: Home / Self Care | Attending: Emergency Medicine | Admitting: Emergency Medicine

## 2011-11-13 DIAGNOSIS — W108XXA Fall (on) (from) other stairs and steps, initial encounter: Secondary | ICD-10-CM

## 2011-11-13 DIAGNOSIS — R51 Headache: Secondary | ICD-10-CM | POA: Insufficient documentation

## 2011-11-13 DIAGNOSIS — S0083XA Contusion of other part of head, initial encounter: Secondary | ICD-10-CM

## 2011-11-13 DIAGNOSIS — I10 Essential (primary) hypertension: Secondary | ICD-10-CM

## 2011-11-13 DIAGNOSIS — S61209A Unspecified open wound of unspecified finger without damage to nail, initial encounter: Secondary | ICD-10-CM | POA: Insufficient documentation

## 2011-11-13 DIAGNOSIS — S01409A Unspecified open wound of unspecified cheek and temporomandibular area, initial encounter: Secondary | ICD-10-CM | POA: Insufficient documentation

## 2011-11-13 DIAGNOSIS — Z7982 Long term (current) use of aspirin: Secondary | ICD-10-CM | POA: Insufficient documentation

## 2011-11-13 DIAGNOSIS — I4891 Unspecified atrial fibrillation: Secondary | ICD-10-CM | POA: Insufficient documentation

## 2011-11-13 DIAGNOSIS — S0003XA Contusion of scalp, initial encounter: Secondary | ICD-10-CM | POA: Insufficient documentation

## 2011-11-13 DIAGNOSIS — Z79899 Other long term (current) drug therapy: Secondary | ICD-10-CM

## 2011-11-13 DIAGNOSIS — W19XXXA Unspecified fall, initial encounter: Secondary | ICD-10-CM

## 2011-11-13 DIAGNOSIS — IMO0002 Reserved for concepts with insufficient information to code with codable children: Secondary | ICD-10-CM | POA: Insufficient documentation

## 2011-11-13 DIAGNOSIS — Z8639 Personal history of other endocrine, nutritional and metabolic disease: Secondary | ICD-10-CM | POA: Insufficient documentation

## 2011-11-13 DIAGNOSIS — Z7901 Long term (current) use of anticoagulants: Secondary | ICD-10-CM | POA: Insufficient documentation

## 2011-11-13 DIAGNOSIS — Z862 Personal history of diseases of the blood and blood-forming organs and certain disorders involving the immune mechanism: Secondary | ICD-10-CM | POA: Insufficient documentation

## 2011-11-13 DIAGNOSIS — S60419A Abrasion of unspecified finger, initial encounter: Secondary | ICD-10-CM

## 2011-11-13 DIAGNOSIS — Y92009 Unspecified place in unspecified non-institutional (private) residence as the place of occurrence of the external cause: Secondary | ICD-10-CM | POA: Insufficient documentation

## 2011-11-13 DIAGNOSIS — I2510 Atherosclerotic heart disease of native coronary artery without angina pectoris: Secondary | ICD-10-CM | POA: Insufficient documentation

## 2011-11-13 DIAGNOSIS — Z23 Encounter for immunization: Secondary | ICD-10-CM

## 2011-11-13 HISTORY — DX: Unspecified atrial fibrillation: I48.91

## 2011-11-13 LAB — BASIC METABOLIC PANEL
Chloride: 98 mEq/L (ref 96–112)
GFR calc Af Amer: >90 mL/min (ref >90)
Potassium: 3.7 mEq/L (ref 3.5–5.1)
Sodium: 136 mEq/L (ref 135–145)

## 2011-11-13 LAB — CBC
Platelets: 218 10*3/uL (ref 150–400)
RDW: 13.9 % (ref 11.5–15.5)
WBC: 9 10*3/uL (ref 4.0–10.5)

## 2011-11-13 LAB — GLUCOSE, CAPILLARY: Glucose-Capillary: 110 mg/dL — ABNORMAL HIGH (ref 70–99)

## 2011-11-13 LAB — PROTIME-INR: INR: 1.54 — ABNORMAL HIGH (ref 0.00–1.49)

## 2011-11-13 MED ORDER — TETANUS-DIPHTHERIA TOXOIDS TD 5-2 LFU IM INJ
INJECTION | INTRAMUSCULAR | Status: AC
  Filled 2011-11-13: qty 0.5

## 2011-11-13 MED ORDER — ACETAMINOPHEN 325 MG PO TABS
650.0000 mg | ORAL_TABLET | Freq: Once | ORAL | Status: AC
  Administered 2011-11-13: 650 mg via ORAL
  Filled 2011-11-13: qty 2

## 2011-11-13 MED ORDER — TETANUS-DIPHTHERIA TOXOIDS TD 5-2 LFU IM INJ
0.5000 mL | INJECTION | Freq: Once | INTRAMUSCULAR | Status: AC
  Administered 2011-11-13: 0.5 mL via INTRAMUSCULAR

## 2011-11-13 NOTE — ED Notes (Signed)
Patient transported to X-ray and CT 

## 2011-11-13 NOTE — ED Notes (Signed)
Pt staes no loc

## 2011-11-13 NOTE — ED Notes (Addendum)
States he tripped going down steps carrying 3 bags of trash , grabbed rail, injury to right hand, impact left orbital area w swelling, ecchymosis; hist of retinal surgery and coumadin use( pt and wife advised to notify providers right away if any visual changes) NAD, w/d/pale , no active bleeding orbital injury or hand, denies other injury. Denies LOC

## 2011-11-13 NOTE — ED Notes (Signed)
Patient returned from xray and CT.

## 2011-11-13 NOTE — ED Provider Notes (Signed)
Medical screening examination/treatment/procedure(s) were conducted as a shared visit with non-physician practitioner(s) and myself.  I personally evaluated the patient during the encounter   Loren Racer, MD 11/13/11 1622

## 2011-11-13 NOTE — ED Provider Notes (Signed)
History     CSN: [id number]  Arrival date & time 11/13/11  1055   First MD Initiated Contact with Patient 11/13/11 1342      Chief Complaint  Patient presents with  . Fall    (Consider location/radiation/quality/duration/timing/severity/associated sxs/prior treatment) Patient is a 71 y.o. male presenting with fall. The history is provided by the patient and the spouse.  Fall Incident onset: approx 8:30 AM this morning. The fall occurred while walking. Distance fallen: from standing. He landed on concrete. The volume of blood lost was moderate ((on coumadin)). Point of impact: left side of face. Pain location: below left eye. The pain is mild. He was ambulatory at the scene. There was no alcohol use involved in the accident.  Mechanical fall, tripped while walking down 4 stairs in front of his home. Denies dizziness, CP, palpitations prior to fall. Reports laceration to right index finger and left side of face, with bleeding controlled at presentation. Denies any LOC in the fall. Currently denies HA, neck pain, back pain, pain to extremities, nausea, vomiting, dizziness, or visual change. Per wife, acting normally. Tx PTA includes wound care by his wife with control of bleeding and assessment by MD at [facility] with referral to ED for further testing. Hx includes a-fib, CAD with CABG 03/2011.  Past Medical History  Diagnosis Date  . Hypertension   . Anxiety   . 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 Atrial fibrillation   . CAD (coronary artery disease)     [name]  . Gout     Past Surgical History  Procedure Date  . Coronary artery bypass graft 03/07/2011    DR.VAN  TRIGHT  . Eye surgery   . Colon surgery     No family history on file.  History  Substance Use Topics  . Smoking status: Never Smoker   . Smokeless tobacco: Never Used  . Alcohol Use: No      Review of Systems 10 systems reviewed and are negative for acute change except as noted in the  HPI.  Allergies  Review of patient's allergies indicates no known allergies.  Home Medications   Current Outpatient Rx  Name Route Sig Dispense Refill  . AMLODIPINE BESYLATE 5 MG PO TABS Oral Take 5 mg by mouth daily.    . ASPIRIN 81 MG PO TABS Oral Take 81 mg by mouth daily.      . OCUVITE PO TABS Oral Take 1 tablet by mouth daily.      Marland Kitchen VITAMIN D 1000 UNITS PO TABS Oral Take 5,000 Units by mouth 3 (three) times a week. Monday, Wednesday, friday    . CITALOPRAM HYDROBROMIDE 40 MG PO TABS Oral Take 40 mg by mouth daily.    . OMEGA-3 FATTY ACIDS 1000 MG PO CAPS Oral Take 1,200 mg by mouth daily.      Marland Kitchen METOPROLOL TARTRATE 25 MG PO TABS Oral Take 12.5 mg by mouth 2 (two) times daily.      Marland Kitchen SIMVASTATIN 20 MG PO TABS Oral Take 20 mg by mouth at bedtime. .25 tablet     . VALSARTAN-HYDROCHLOROTHIAZIDE 160-25 MG PO TABS Oral Take 1 tablet by mouth daily.      . WARFARIN SODIUM 5 MG PO TABS Oral Take 5-7.5 mg by mouth daily. Take 1 tablet daily except take 7.5mg  on Monday, Wednesday, friday       BP 142/56  Pulse 59  Temp 98.2 F (36.8  C)  Resp 16  SpO2 99%  Physical Exam  Nursing note reviewed. Constitutional: He is oriented to person, place, and time. He appears well-developed and well-nourished. No distress.       Vital signs are reviewed and are normal.   HENT:  Head: Normocephalic. Head is with contusion and with laceration.    Eyes: Conjunctivae and EOM are normal. Pupils are equal, round, and reactive to light.       No hyphema. Visual fields full.  Neck: Normal range of motion. Neck supple. No spinous process tenderness and no muscular tenderness present.  Cardiovascular: Normal rate, regular rhythm and normal heart sounds.        Bilateral radial and DP pulses are 2+   Pulmonary/Chest: Effort normal and breath sounds normal. No respiratory distress. He has no wheezes. He exhibits no tenderness.  Abdominal: Soft. Bowel sounds are normal. He exhibits no distension. There  is no tenderness.  Musculoskeletal: Normal range of motion. He exhibits no tenderness.       Thoracic back: He exhibits normal range of motion and no tenderness.       Lumbar back: He exhibits normal range of motion and no tenderness.       Right hand: normal sensation noted. Normal strength noted.       Hands: Neurological: He is alert and oriented to person, place, and time. He has normal strength. Cranial nerve deficit: CN 3-12 tested and are intact. He displays a negative Romberg sign. Gait normal. GCS eye subscore is 4. GCS verbal subscore is 5. GCS motor subscore is 6.       Sensation intact to light touch and symmetric in BUE and BLE. F-N intact bilaterally. Speech clear.   Skin: Skin is warm and dry.    ED Course  Procedures (including critical care time)  Labs Reviewed  PROTIME-INR - Abnormal; Notable for the following:    Prothrombin Time 18.8 (*)    INR 1.54 (*)    All other components within normal limits  BASIC METABOLIC PANEL - Abnormal; Notable for the following:    Glucose, Bld 105 (*)    GFR calc non Af Amer 83 (*)    All other components within normal limits  CBC   Ct Head Wo Contrast  11/13/2011  *RADIOLOGY REPORT*  Clinical Data:  Fall.  On Coumadin.  Left orbital injury.  CT HEAD WITHOUT CONTRAST CT MAXILLOFACIAL WITHOUT CONTRAST CT CERVICAL SPINE WITHOUT CONTRAST  Technique:  Multidetector CT imaging of the head, cervical spine, and maxillofacial structures were performed using the standard protocol without intravenous contrast. Multiplanar CT image reconstructions of the cervical spine and maxillofacial structures were also generated.  Comparison:  None.  CT HEAD  Findings: There is atrophy and chronic small vessel disease changes. No acute intracranial abnormality.  Specifically, no hemorrhage, hydrocephalus, mass lesion, acute infarction, or significant intracranial injury.  No acute calvarial abnormality. Soft tissue swelling over the left orbit.  See maxillofacial  CT report below.  Paranasal sinuses are clear as are the mastoids.  IMPRESSION: No acute intracranial abnormality.  Atrophy, chronic microvascular disease.  CT MAXILLOFACIAL  Findings:  Soft tissue swelling/hematoma over the left orbit and left cheek.  Globe is intact.  Underlying bony structures intact. No orbital or facial fracture.  Paranasal sinuses are clear.  IMPRESSION: No evidence of facial or orbital fracture.    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  Normal alignment.  Prevertebral soft  tissues are normal. No fracture.  No epidural or paraspinal hematoma.  IMPRESSION: Mild degenerative changes as above.  No acute findings.  Original Report Authenticated By: Cyndie Chime, M.D.   Ct Cervical Spine Wo Contrast  11/13/2011  *RADIOLOGY REPORT*  Clinical Data:  Fall.  On Coumadin.  Left orbital injury.  CT HEAD WITHOUT CONTRAST CT MAXILLOFACIAL WITHOUT CONTRAST CT CERVICAL SPINE WITHOUT CONTRAST  Technique:  Multidetector CT imaging of the head, cervical spine, and maxillofacial structures were performed using the standard protocol without intravenous contrast. Multiplanar CT image reconstructions of the cervical spine and maxillofacial structures were also generated.  Comparison:  None.  CT HEAD  Findings: There is atrophy and chronic small vessel disease changes. No acute intracranial abnormality.  Specifically, no hemorrhage, hydrocephalus, mass lesion, acute infarction, or significant intracranial injury.  No acute calvarial abnormality. Soft tissue swelling over the left orbit.  See maxillofacial CT report below.  Paranasal sinuses are clear as are the mastoids.  IMPRESSION: No acute intracranial abnormality.  Atrophy, chronic microvascular disease.  CT MAXILLOFACIAL  Findings:  Soft tissue swelling/hematoma over the left orbit and left cheek.  Globe is intact.  Underlying bony structures intact. No orbital or facial fracture.  Paranasal sinuses are  clear.  IMPRESSION: No evidence of facial or orbital fracture.    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  Normal alignment.  Prevertebral soft tissues are normal. No fracture.  No epidural or paraspinal hematoma.  IMPRESSION: Mild degenerative changes as above.  No acute findings.  Original Report Authenticated By: Cyndie Chime, M.D.   Ct Maxillofacial Wo Cm  11/13/2011  *RADIOLOGY REPORT*  Clinical Data:  Fall.  On Coumadin.  Left orbital injury.  CT HEAD WITHOUT CONTRAST CT MAXILLOFACIAL WITHOUT CONTRAST CT CERVICAL SPINE WITHOUT CONTRAST  Technique:  Multidetector CT imaging of the head, cervical spine, and maxillofacial structures were performed using the standard protocol without intravenous contrast. Multiplanar CT image reconstructions of the cervical spine and maxillofacial structures were also generated.  Comparison:  None.  CT HEAD  Findings: There is atrophy and chronic small vessel disease changes. No acute intracranial abnormality.  Specifically, no hemorrhage, hydrocephalus, mass lesion, acute infarction, or significant intracranial injury.  No acute calvarial abnormality. Soft tissue swelling over the left orbit.  See maxillofacial CT report below.  Paranasal sinuses are clear as are the mastoids.  IMPRESSION: No acute intracranial abnormality.  Atrophy, chronic microvascular disease.  CT MAXILLOFACIAL  Findings:  Soft tissue swelling/hematoma over the left orbit and left cheek.  Globe is intact.  Underlying bony structures intact. No orbital or facial fracture.  Paranasal sinuses are clear.  IMPRESSION: No evidence of facial or orbital fracture.    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  Normal alignment.  Prevertebral soft tissues are normal. No fracture.  No epidural or paraspinal hematoma.  IMPRESSION: Mild degenerative changes as above.  No acute findings.  Original Report Authenticated By:  Cyndie Chime, M.D.     1. Fall   2. Facial contusion   3. Finger abrasion       MDM  Mechanical fall, facial trauma. Labs reviewed, sig for only sub-therapeutic INR. Discussed this with patient and wife, who will f/u with coumadin clinic regarding result and recommendations. Scans of head, neck, and face are reviewed and indicate to hemorrhage or fx. Regarding abrasion and laceration, neither with bleeding and both with good  approximation- no repair is needed. Advised wife in appropriate wound care at home. Results are all discussed with pt and spouse. He will be d/c home at this time.        Shaaron Adler, [place] 11/13/11 1552

## 2011-11-13 NOTE — ED Provider Notes (Addendum)
History     CSN: [id number]  Arrival date & time 11/13/11  1478   First MD Initiated Contact with Patient 11/13/11 [phone]      Chief Complaint  Patient presents with  . Fall    (Consider location/radiation/quality/duration/timing/severity/associated sxs/prior treatment) HPI Comments: Patient reports having a mechanical fall down some stairs around 8:30 this morning, landing on the concrete, hitting the left side of his face. Now has large, tender, facial bruise with a laceration. Pain is worse with palpation, no alleviating factors. He has tried an ice pack over the bruise. Reports localized pain, no pain with EOM's, visual changes, other headache. Also complains of right index finger abrasion. Denies presyncope, syncope, chest pain, palpitations causing fall. Denies dental pain, neck pain, back pain, or any other injury. Patient with history of A. fib, hypertension, multivessel CABG for unstable angina 2012. Is currently on Coumadin. No history of diabetes.  ROS as noted in HPI. All other ROS negative.  Patient is a 71 y.o. male presenting with fall. The history is provided by the patient. No language interpreter was used.  Fall    Past Medical History  Diagnosis Date  . Hypertension   . Anxiety   . 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 Atrial fibrillation   . CAD (coronary artery disease)     [name]  . Gout     Past Surgical History  Procedure Date  . Coronary artery bypass graft 03/07/2011    DR.VAN  TRIGHT  . Eye surgery   . Colon surgery     History reviewed. No pertinent family history.  History  Substance Use Topics  . Smoking status: Never Smoker   . Smokeless tobacco: Never Used  . Alcohol Use: No      Review of Systems  Allergies  Review of patient's allergies indicates no known allergies.  Home Medications   Current Outpatient Rx  Name Route Sig Dispense Refill  . ASPIRIN 81 MG PO TABS Oral Take 81 mg by mouth daily.      . OCUVITE PO  TABS Oral Take 1 tablet by mouth daily.      Marland Kitchen VITAMIN D 1000 UNITS PO TABS Oral Take 5,000 Units by mouth daily.      . OMEGA-3 FATTY ACIDS 1000 MG PO CAPS Oral Take 1,200 mg by mouth daily.      Marland Kitchen METOPROLOL TARTRATE 25 MG PO TABS Oral Take 12.5 mg by mouth 2 (two) times daily.      Marland Kitchen SIMVASTATIN 20 MG PO TABS Oral Take 20 mg by mouth at bedtime. .25 tablet     . VALSARTAN-HYDROCHLOROTHIAZIDE 160-25 MG PO TABS Oral Take 1 tablet by mouth daily.      . WARFARIN SODIUM 5 MG PO TABS Oral Take 5 mg by mouth daily. Or as directed        BP 147/59  Pulse 56  Temp(Src) 97.5 F (36.4 C) (Oral)  Resp 18  SpO2 100%  Physical Exam  Nursing note and vitals reviewed. Constitutional: He is oriented to person, place, and time. He appears well-developed and well-nourished.  HENT:  Head: Normocephalic. Head is with contusion and with laceration. Head is without Battle's sign. No trismus in the jaw.    Right Ear: External ear normal.  Left Ear: External ear normal.  Nose: Nose normal. No epistaxis. Right sinus exhibits no maxillary sinus tenderness and no frontal sinus tenderness. Left sinus exhibits no maxillary sinus tenderness and  no frontal sinus tenderness.  Mouth/Throat: Uvula is midline, oropharynx is clear and moist and mucous membranes are normal. Normal dentition.       5 x 3 cm hematoma with small laceration- see drawing  Eyes: Conjunctivae and EOM are normal. Pupils are equal, round, and reactive to light.  Neck: Normal range of motion. Neck supple. No spinous process tenderness and no muscular tenderness present. Normal range of motion present.  Cardiovascular: Normal rate, regular rhythm, normal heart sounds and intact distal pulses.   Pulmonary/Chest: Effort normal and breath sounds normal. He exhibits no tenderness.  Abdominal: Soft. Bowel sounds are normal. There is no tenderness.  Musculoskeletal: Normal range of motion. He exhibits no tenderness.       T-spine, L-spine  nontender. Right index finger, small abrasion no bleeding. No tenderness along phalanges. DIP, PIP joint stable on varus/valgus stress. Strength 5/5. Hand, wrist otherwise normal  Neurological: He is alert and oriented to person, place, and time.  Skin: Skin is warm and dry.  Psychiatric: He has a normal mood and affect. His behavior is normal. Judgment and thought content normal.    ED Course  Procedures (including critical care time)  Labs Reviewed  GLUCOSE, CAPILLARY - Abnormal; Notable for the following:    Glucose-Capillary 110 (*)    All other components within normal limits   No results found.   1. Fall   2. Facial contusion     EKG: Sinus bradycardia, rate 56 right axis deviation. Normal intervals. No hypertrophy. No ST T-wave changes compared to previous EKG from September 2012.  MDM  Previous records reviewed. As noted in history of present illness. Patient neurologically intact, exam remarkable for large facial hematoma. Transferring to the ED for head and facial CT to rule out facial fracture and subdural hematoma.  Luiz Blare, MD 11/13/11 1036  Luiz Blare, MD 11/13/11 1038

## 2011-11-13 NOTE — ED Notes (Signed)
Patient states he was walking down four steps and tripped at the bottom and caught himself by his right hand, cutting his right index finger and falling to the ground hitting his left eye. Patient resting and denies pain. Right finger bleeding controlled and bleeding controlled around left eye. Family at bedside.

## 2011-11-13 NOTE — Discharge Instructions (Signed)
As we discussed, your scans of the head, neck, and face today showed no bleeding inside your head and no broken bones. Your labs results are shown below.   Results for orders placed during the hospital encounter of 11/13/11  PROTIME-INR      Component Value Range   Prothrombin Time 18.8 (*) 11.6 - 15.2 (seconds)   INR 1.54 (*) 0.00 - 1.49   CBC      Component Value Range   WBC 9.0  4.0 - 10.5 (K/uL)   RBC 4.68  4.22 - 5.81 (MIL/uL)   Hemoglobin 13.9  13.0 - 17.0 (g/dL)   HCT 16.1  09.6 - 04.5 (%)   MCV 88.0  78.0 - 100.0 (fL)   MCH 29.7  26.0 - 34.0 (pg)   MCHC 33.7  30.0 - 36.0 (g/dL)   RDW 40.9  81.1 - 91.4 (%)   Platelets 218  150 - 400 (K/uL)  BASIC METABOLIC PANEL      Component Value Range   Sodium 136  135 - 145 (mEq/L)   Potassium 3.7  3.5 - 5.1 (mEq/L)   Chloride 98  96 - 112 (mEq/L)   CO2 28  19 - 32 (mEq/L)   Glucose, Bld 105 (*) 70 - 99 (mg/dL)   BUN 17  6 - 23 (mg/dL)   Creatinine, Ser 7.82  0.50 - 1.35 (mg/dL)   Calcium 9.2  8.4 - 95.6 (mg/dL)   GFR calc non Af Amer 83 (*) >90 (mL/min)   GFR calc Af Amer >90  >90 (mL/min)     As we discussed, you can apply antibiotic ointment to the wounds for the next several days to help prevent infection.      Home Safety and Preventing Falls Falls are a leading cause of injury and while they affect all age groups, falls have greater short-term and long-term impact on older age groups. However, falls should not be a part of life or aging. It is possible for individuals and their families to use preventive measures to significantly decrease the likelihood that anyone, especially an older adult, will fall. There are many simple measures which can make your home safer with respect to preventing falls. The following actions can help reduce falls among all members of your family and are especially important as you age, when your balance, lower limb strength, coordination, and eyesight may be declining. The use of preventive  measures will help to reduce you and your family's risk of falls and serious medical consequences. OUTDOORS  Repair cracks and edges of walkways and driveways.   Remove high doorway thresholds and trim shrubbery on the main path into your home.   Ensure there is good outside lighting at main entrances and along main walkways.   Clear walkways of tools, rocks, debris, and clutter.   Check that handrails are not broken and are securely fastened. Both sides of steps should have handrails.   In the garage, be attentive to and clean up grease or oil spills on the cement. This can make the surface extremely slippery.   In winter, have leaves, snow, and ice cleared regularly.   Use sand or salt on walkways during winter months.  BATHROOM  Install grab bars by the toilet and in the tub and shower.   Use non-skid mats or decals in the tub or shower.   If unable to easily stand unsupported while showering, place a plastic non slip stool in the shower to sit on when needed.  Install night lights.   Keep floors dry and clean up all water on the floor immediately.   Remove soap buildup in tub or shower on a regular basis.   Secure bath mats with non-slip, double-sided rug tape.   Remove tripping hazards from the floors.  BEDROOMS  Install night lights.   Do not use oversized bedding.   Make sure a bedside light is easy to reach.   Keep a telephone by your bedside.   Make sure that you can get in and out of your bed easily.   Have a firm chair, with side arms, to use for getting dressed.   Remove clutter from around closets.   Store clothing, bed coverings, and other household items where you can reach them comfortably.   Remove tripping hazards from the floor.  LIVING AREAS AND STAIRWAYS  Turn on lights to avoid having to walk through dark areas.   Keep lighting uniform in each room. Place brighter lightbulbs in darker areas, including stairways.   Replace lightbulbs  that burn out in stairways immediately.   Arrange furniture to provide for clear pathways.   Keep furniture in the same place.   Eliminate or tape down electrical cables in high traffic areas.   Place handrails on both sides of stairways. Use handrails when going up or down stairs.   Most falls occur on the top or bottom 3 steps.   Fix any loose handrails. Make sure handrails on both sides of the stairways are as long as the stairs.   Remove all walkway obstacles.   Coil or tape electrical cords off to the side of walking areas and out of the way. If using many extension cords, have an electrician put in a new wall outlet to reduce or eliminate them.   Make sure spills are cleaned up quickly and allow time for drying before walking on freshly cleaned floors.   Firmly attach carpet with non-skid or two-sided tape.   Keep frequently used items within easy reach.   Remove tripping hazards such as throw rugs and clutter in walkways. Never leave objects on stairs.   Get rid of throw rugs elsewhere if possible.   Eliminate uneven floor surfaces.   Make sure couches and chairs are easy to get into and out of.   Check carpeting to make sure it is firmly attached along stairs.   Make repairs to worn or loose carpet promptly.   Select a carpet pattern that does not visually hide the edge of steps.   Avoid placing throw rugs or scatter rugs at the top or bottom of stairways, or properly secure with carpet tape to prevent slippage.   Have an electrician put in a light switch at the top and bottom of the stairs.   Get light switches that glow.   Avoid the following practices: hurrying, inattention, obscured vision, carrying large loads, and wearing slip-on shoes.   Be aware of all pets.  KITCHEN  Place items that are used frequently, such as dishes and food, within easy reach.   Keep handles on pots and pans toward the center of the stove. Use back burners when possible.    Make sure spills are cleaned up quickly and allow time for drying.   Avoid walking on wet floors.   Avoid hot utensils and knives.   Position shelves so they are not too high or low.   Place commonly used objects within easy reach.   If necessary, use a sturdy  step stool with a grab bar when reaching.   Make sure electrical cables are out of the way.   Do not use floor polish or wax that makes floors slippery.  OTHER HOME FALL PREVENTION STRATEGIES  Wear low heel or rubber sole shoes that are supportive and fit well.   Wear closed toe shoes.   Know and watch for side effects of medications. Have your caregiver or pharmacist look at all your medicines, even over-the-counter medicines. Some medicines can make you sleepy or dizzy.   Exercise regularly. Exercise makes you stronger and improves your balance and coordination.   Limit use of alcohol.   Use eyeglasses if necessary and keep them clean. Have your vision checked every year.   Organize your household in a manner that minimizes the need to walk distances when hurried, or go up and down stairs unnecessarily. For example, have a phone placed on at least each floor of your home. If possible, have a phone beside each sitting or lying area where you spend the most time at home. Keep emergency numbers posted at all phones.   Use non-skid floor wax.   When using a ladder, make sure:   The base is firm.   All ladder feet are on level ground.   The ladder is angled against the wall properly.   When climbing a ladder, face the ladder and hold the ladder rungs firmly.   If reaching, always keep your hips and body weight centered between the rails.   When using a stepladder, make sure it is fully opened and both spreaders are firmly locked.   Do not climb a closed stepladder.   Avoid climbing beyond the second step from the top of a stepladder and the 4th rung from the top of an extension ladder.   Learn and use  mobility aids as needed.   Change positions slowly. Arise slowly from sitting and lying positions. Sit on the edge of your bed before getting to your feet.   If you have a history of falls, ask someone to add color or contrast paint or tape to grab bars and handrails in your home.   If you have a history of falls, ask someone to place contrasting color strips on first and last steps.   Install an electrical emergency response system if you need one, and know how to use it.   If you have a medical or other condition that causes you to have limited physical strength, it is important that you reach out to family and friends for occasional help.  FOR CHILDREN:  If young children are in the home, use safety gates. At the top of stairs use screw-mounted gates; use pressure-mounted gates for the bottom of the stairs and doorways between rooms.   Young children should be taught to descend stairs on their stomachs, feet first, and later using the handrail.   Keep drawers fully closed to prevent them from being climbed on or pulled out entirely.   Move chairs, cribs, beds and other furniture away from windows.   Consider installing window guards on windows ground floor and up, unless they are emergency fire exits. Make sure they have easy release mechanisms.   Consider installing special locks that only allow the window to be opened to a certain height.   Never rely on window screens to prevent falls.   Never leave babies alone on changing tables, beds or sofas. Use a changing table that has a restraining strap.  When a child can pull to a standing position, the crib mattress should be adjusted to its lowest position. There should be at least 26 inches between the top rails of the crib drop side and the mattress. Toys, bumper pads, and other objects that can be used as steps to climb out should be removed from the crib.   On bunk beds never allow a child under age 27 to sleep on the top bunk.  For older children, if the upper bunk is not against a wall, use guard rails on both sides. No matter how old a child is, keep the guard rails in place on the top bunk since children roll during sleep. Do not permit horseplay on bunks.   Grass and soil surfaces beneath backyard playground equipment should be replaced with hardwood chips, shredded wood mulch, sand, pea gravel, rubber, crushed stone, or another safer material at depths of at least 9 to 12 inches.   When riding bikes or using skates, skateboards, skis, or snowboards, require children to wear helmets. Look for those that have stickers stating that they meet or exceed safety standards.   Vertical posts or pickets in deck, balcony, and stairway railings should be no more than 3 1/2 inches apart if a young baby will have access to the area. The space between horizontal rails or bars, and between the floor and the first horizontal rail or bar, should be no more than 3 1/2 inches.  Document Released: 06/09/2002 Document Revised: 06/08/2011 Document Reviewed: 04/08/2009 [facility] Patient Information 2012 [place], [place].          Abrasions An abrasion is a scraped area on the skin. Abrasions do not go through all layers of the skin.  HOME CARE  Change any bandages (dressings) as told by your doctor. If the bandage sticks, soak it off with warm, soapy water. Change the bandage if it gets wet, dirty, or starts to smell.   Wash the area with soap and water twice a day. Rinse off the soap. Pat the area dry with a clean towel.   Look at the injured area for signs of infection. Infection signs include redness, puffiness (swelling), tenderness, or yellowish white fluid (pus) coming from the wound.   Apply medicated cream as told by your doctor.   Only take medicine as told by your doctor.   Follow up with your doctor as told.  GET HELP RIGHT AWAY IF:   You have more pain in your wound.   You have redness, puffiness (swelling), or  tenderness around your wound.   You have yellowish white fluid (pus) coming from your wound.   You have a fever.   A bad smell is coming from the wound or bandage.  MAKE SURE YOU:   Understand these instructions.   Will watch your condition.   Will get help right away if you are not doing well or get worse.  Document Released: 12/06/2007 Document Revised: 06/08/2011 Document Reviewed: 05/23/2011 [facility] Patient Information 2012 [place], [place].

## 2011-11-13 NOTE — ED Notes (Signed)
Tripped going down starirs landed on left side  Hit face rt eye  Which has some swelling is on coumadin he states was [facility] at [facility] and sent for further tests

## 2011-11-15 ENCOUNTER — Encounter (HOSPITAL_COMMUNITY): Payer: Medicare Other

## 2011-11-17 ENCOUNTER — Encounter (HOSPITAL_COMMUNITY): Admission: RE | Admit: 2011-11-17 | Payer: Medicare Other | Source: Ambulatory Visit

## 2011-11-17 ENCOUNTER — Encounter (HOSPITAL_COMMUNITY): Payer: Medicare Other

## 2011-11-20 ENCOUNTER — Encounter (HOSPITAL_COMMUNITY)
Admission: RE | Admit: 2011-11-20 | Discharge: 2011-11-20 | Disposition: A | Discharge status: Home / Self Care | Payer: Medicare Other | Source: Ambulatory Visit | Attending: Cardiovascular Disease | Admitting: Cardiovascular Disease

## 2011-11-20 ENCOUNTER — Encounter (HOSPITAL_COMMUNITY): Payer: Medicare Other

## 2011-11-22 ENCOUNTER — Encounter (HOSPITAL_COMMUNITY)
Admission: RE | Admit: 2011-11-22 | Discharge: 2011-11-22 | Disposition: A | Discharge status: Home / Self Care | Payer: Medicare Other | Source: Ambulatory Visit | Attending: Cardiovascular Disease | Admitting: Cardiovascular Disease

## 2011-11-22 ENCOUNTER — Encounter (HOSPITAL_COMMUNITY): Payer: Medicare Other

## 2011-11-24 ENCOUNTER — Encounter (HOSPITAL_COMMUNITY): Payer: Medicare Other

## 2011-11-24 ENCOUNTER — Encounter (HOSPITAL_COMMUNITY)
Admission: RE | Admit: 2011-11-24 | Discharge: 2011-11-24 | Disposition: A | Discharge status: Home / Self Care | Payer: Medicare Other | Source: Ambulatory Visit | Attending: Cardiovascular Disease | Admitting: Cardiovascular Disease

## 2011-11-27 ENCOUNTER — Encounter (HOSPITAL_COMMUNITY): Payer: Medicare Other

## 2011-11-29 ENCOUNTER — Encounter (HOSPITAL_COMMUNITY)
Admission: RE | Admit: 2011-11-29 | Discharge: 2011-11-29 | Disposition: A | Discharge status: Home / Self Care | Payer: Medicare Other | Source: Ambulatory Visit | Attending: Cardiovascular Disease | Admitting: Cardiovascular Disease

## 2011-11-29 ENCOUNTER — Encounter (HOSPITAL_COMMUNITY): Payer: Medicare Other

## 2011-12-01 ENCOUNTER — Encounter (HOSPITAL_COMMUNITY): Payer: Medicare Other

## 2011-12-04 ENCOUNTER — Encounter (HOSPITAL_COMMUNITY): Payer: Medicare Other

## 2011-12-04 ENCOUNTER — Encounter (HOSPITAL_COMMUNITY): Payer: Self-pay

## 2011-12-06 ENCOUNTER — Encounter (HOSPITAL_COMMUNITY): Payer: Medicare Other

## 2011-12-06 ENCOUNTER — Encounter (HOSPITAL_COMMUNITY): Payer: Self-pay

## 2011-12-08 ENCOUNTER — Encounter (HOSPITAL_COMMUNITY): Payer: Self-pay

## 2011-12-08 ENCOUNTER — Encounter (HOSPITAL_COMMUNITY): Payer: Medicare Other

## 2011-12-11 ENCOUNTER — Encounter (HOSPITAL_COMMUNITY): Payer: Self-pay

## 2011-12-11 ENCOUNTER — Encounter (HOSPITAL_COMMUNITY): Payer: Medicare Other

## 2011-12-13 ENCOUNTER — Encounter (HOSPITAL_COMMUNITY): Payer: Self-pay

## 2011-12-13 ENCOUNTER — Encounter (HOSPITAL_COMMUNITY): Payer: Medicare Other

## 2011-12-15 ENCOUNTER — Encounter (HOSPITAL_COMMUNITY): Payer: Self-pay

## 2011-12-15 ENCOUNTER — Encounter (HOSPITAL_COMMUNITY): Payer: Medicare Other

## 2011-12-18 ENCOUNTER — Encounter (HOSPITAL_COMMUNITY): Payer: Self-pay

## 2011-12-18 ENCOUNTER — Encounter (HOSPITAL_COMMUNITY): Payer: Medicare Other

## 2011-12-20 ENCOUNTER — Encounter (HOSPITAL_COMMUNITY): Payer: Self-pay

## 2011-12-20 ENCOUNTER — Encounter (HOSPITAL_COMMUNITY): Payer: Medicare Other

## 2011-12-22 ENCOUNTER — Encounter (HOSPITAL_COMMUNITY): Payer: Self-pay

## 2011-12-22 ENCOUNTER — Encounter (HOSPITAL_COMMUNITY): Payer: Medicare Other

## 2011-12-25 ENCOUNTER — Encounter (HOSPITAL_COMMUNITY): Payer: Self-pay

## 2011-12-25 ENCOUNTER — Encounter (HOSPITAL_COMMUNITY): Payer: Medicare Other

## 2011-12-27 ENCOUNTER — Encounter (HOSPITAL_COMMUNITY): Payer: Medicare Other

## 2011-12-27 ENCOUNTER — Encounter (HOSPITAL_COMMUNITY): Payer: Self-pay

## 2011-12-29 ENCOUNTER — Encounter (HOSPITAL_COMMUNITY): Payer: Medicare Other

## 2011-12-29 ENCOUNTER — Encounter (HOSPITAL_COMMUNITY): Payer: Self-pay

## 2012-01-01 ENCOUNTER — Encounter (HOSPITAL_COMMUNITY): Payer: Medicare Other

## 2012-01-01 ENCOUNTER — Encounter (HOSPITAL_COMMUNITY): Payer: Self-pay

## 2012-01-03 ENCOUNTER — Encounter (HOSPITAL_COMMUNITY): Payer: Medicare Other

## 2012-01-03 ENCOUNTER — Encounter (HOSPITAL_COMMUNITY): Payer: Self-pay

## 2012-01-05 ENCOUNTER — Encounter (HOSPITAL_COMMUNITY): Payer: Self-pay

## 2012-01-05 ENCOUNTER — Encounter (HOSPITAL_COMMUNITY): Payer: Medicare Other

## 2012-01-08 ENCOUNTER — Encounter (HOSPITAL_COMMUNITY): Payer: Self-pay

## 2012-01-08 ENCOUNTER — Encounter (HOSPITAL_COMMUNITY): Payer: Medicare Other

## 2012-01-10 ENCOUNTER — Encounter (HOSPITAL_COMMUNITY): Payer: Self-pay

## 2012-01-10 ENCOUNTER — Encounter (HOSPITAL_COMMUNITY): Payer: Medicare Other

## 2012-01-12 ENCOUNTER — Encounter (HOSPITAL_COMMUNITY): Payer: Medicare Other

## 2012-01-12 ENCOUNTER — Encounter (HOSPITAL_COMMUNITY): Payer: Self-pay

## 2012-01-15 ENCOUNTER — Encounter (HOSPITAL_COMMUNITY): Payer: Medicare Other

## 2012-01-15 ENCOUNTER — Encounter (HOSPITAL_COMMUNITY): Payer: Self-pay

## 2012-01-17 ENCOUNTER — Encounter (HOSPITAL_COMMUNITY): Payer: Medicare Other

## 2012-01-17 ENCOUNTER — Encounter (HOSPITAL_COMMUNITY): Payer: Self-pay

## 2012-01-19 ENCOUNTER — Encounter (HOSPITAL_COMMUNITY): Payer: Self-pay

## 2012-01-19 ENCOUNTER — Encounter (HOSPITAL_COMMUNITY): Payer: Medicare Other

## 2012-01-22 ENCOUNTER — Encounter (HOSPITAL_COMMUNITY): Payer: Self-pay

## 2012-01-22 ENCOUNTER — Encounter (HOSPITAL_COMMUNITY): Payer: Medicare Other

## 2012-01-24 ENCOUNTER — Encounter (HOSPITAL_COMMUNITY): Payer: Medicare Other

## 2012-01-24 ENCOUNTER — Encounter (HOSPITAL_COMMUNITY): Payer: Self-pay

## 2012-01-26 ENCOUNTER — Encounter (HOSPITAL_COMMUNITY): Payer: Self-pay

## 2012-01-26 ENCOUNTER — Encounter (HOSPITAL_COMMUNITY): Payer: Medicare Other

## 2012-01-29 ENCOUNTER — Encounter (HOSPITAL_COMMUNITY): Payer: Medicare Other

## 2012-01-29 ENCOUNTER — Encounter (HOSPITAL_COMMUNITY): Payer: Self-pay

## 2012-01-31 ENCOUNTER — Encounter (HOSPITAL_COMMUNITY): Payer: Medicare Other

## 2012-01-31 ENCOUNTER — Encounter (HOSPITAL_COMMUNITY): Payer: Self-pay

## 2012-02-02 ENCOUNTER — Encounter (HOSPITAL_COMMUNITY): Payer: Self-pay

## 2012-02-02 ENCOUNTER — Encounter (HOSPITAL_COMMUNITY): Payer: Medicare Other

## 2012-02-05 ENCOUNTER — Encounter (HOSPITAL_COMMUNITY): Payer: Medicare Other

## 2012-02-05 ENCOUNTER — Encounter (HOSPITAL_COMMUNITY): Payer: Self-pay

## 2012-02-07 ENCOUNTER — Encounter (HOSPITAL_COMMUNITY): Payer: Medicare Other

## 2012-02-07 ENCOUNTER — Encounter (HOSPITAL_COMMUNITY): Payer: Self-pay

## 2012-02-09 ENCOUNTER — Encounter (HOSPITAL_COMMUNITY): Payer: Medicare Other

## 2012-02-09 ENCOUNTER — Encounter (HOSPITAL_COMMUNITY): Payer: Self-pay

## 2012-02-12 ENCOUNTER — Encounter (HOSPITAL_COMMUNITY): Payer: Medicare Other

## 2012-02-12 ENCOUNTER — Encounter (HOSPITAL_COMMUNITY): Payer: Self-pay

## 2012-02-14 ENCOUNTER — Encounter (HOSPITAL_COMMUNITY): Payer: Medicare Other

## 2012-02-14 ENCOUNTER — Encounter (HOSPITAL_COMMUNITY): Payer: Self-pay

## 2012-02-16 ENCOUNTER — Encounter (HOSPITAL_COMMUNITY): Payer: Medicare Other

## 2012-02-16 ENCOUNTER — Encounter (HOSPITAL_COMMUNITY): Payer: Self-pay

## 2012-02-19 ENCOUNTER — Encounter (HOSPITAL_COMMUNITY): Payer: Medicare Other

## 2012-02-19 ENCOUNTER — Encounter (HOSPITAL_COMMUNITY): Payer: Self-pay

## 2012-02-20 HISTORY — PX: TRANSTHORACIC ECHOCARDIOGRAM: SHX275

## 2012-02-20 HISTORY — PX: OTHER SURGICAL HISTORY: SHX169

## 2012-02-21 ENCOUNTER — Encounter (HOSPITAL_COMMUNITY): Payer: Self-pay

## 2012-02-21 ENCOUNTER — Encounter (HOSPITAL_COMMUNITY): Payer: Medicare Other

## 2012-02-23 ENCOUNTER — Encounter (HOSPITAL_COMMUNITY): Payer: Self-pay

## 2012-02-23 ENCOUNTER — Encounter (HOSPITAL_COMMUNITY): Payer: Medicare Other

## 2012-02-26 ENCOUNTER — Encounter (HOSPITAL_COMMUNITY): Payer: Self-pay

## 2012-02-26 ENCOUNTER — Encounter (HOSPITAL_COMMUNITY): Payer: Medicare Other

## 2012-02-28 ENCOUNTER — Encounter (HOSPITAL_COMMUNITY): Payer: Medicare Other

## 2012-02-28 ENCOUNTER — Encounter (HOSPITAL_COMMUNITY): Payer: Self-pay

## 2012-03-01 ENCOUNTER — Encounter (HOSPITAL_COMMUNITY): Payer: Medicare Other

## 2012-03-01 ENCOUNTER — Encounter (HOSPITAL_COMMUNITY): Payer: Self-pay

## 2012-03-04 ENCOUNTER — Encounter (HOSPITAL_COMMUNITY): Payer: Medicare Other

## 2012-03-04 ENCOUNTER — Encounter (HOSPITAL_COMMUNITY): Payer: Self-pay

## 2012-03-06 ENCOUNTER — Encounter (HOSPITAL_COMMUNITY): Payer: Medicare Other

## 2012-03-06 ENCOUNTER — Encounter (HOSPITAL_COMMUNITY): Payer: Self-pay

## 2012-03-08 ENCOUNTER — Encounter (HOSPITAL_COMMUNITY): Payer: Self-pay

## 2012-03-08 ENCOUNTER — Encounter (HOSPITAL_COMMUNITY): Payer: Medicare Other

## 2012-03-11 ENCOUNTER — Encounter (HOSPITAL_COMMUNITY): Payer: Medicare Other

## 2012-03-11 ENCOUNTER — Encounter (HOSPITAL_COMMUNITY): Payer: Self-pay

## 2012-03-13 ENCOUNTER — Encounter (HOSPITAL_COMMUNITY): Payer: Self-pay

## 2012-03-13 ENCOUNTER — Encounter (HOSPITAL_COMMUNITY): Payer: Medicare Other

## 2012-03-15 ENCOUNTER — Encounter (HOSPITAL_COMMUNITY): Payer: Medicare Other

## 2012-03-15 ENCOUNTER — Encounter (HOSPITAL_COMMUNITY): Payer: Self-pay

## 2012-03-18 ENCOUNTER — Encounter (HOSPITAL_COMMUNITY): Payer: Medicare Other

## 2012-03-18 ENCOUNTER — Encounter (HOSPITAL_COMMUNITY): Payer: Self-pay

## 2012-03-20 ENCOUNTER — Encounter (HOSPITAL_COMMUNITY): Payer: Medicare Other

## 2012-03-20 ENCOUNTER — Encounter (HOSPITAL_COMMUNITY): Payer: Self-pay

## 2012-03-22 ENCOUNTER — Encounter (HOSPITAL_COMMUNITY): Payer: Medicare Other

## 2012-03-22 ENCOUNTER — Encounter (HOSPITAL_COMMUNITY): Payer: Self-pay

## 2012-03-25 ENCOUNTER — Encounter (HOSPITAL_COMMUNITY): Payer: Medicare Other

## 2012-03-25 ENCOUNTER — Encounter (HOSPITAL_COMMUNITY): Payer: Self-pay

## 2012-03-27 ENCOUNTER — Encounter (HOSPITAL_COMMUNITY): Payer: Medicare Other

## 2012-03-27 ENCOUNTER — Encounter (HOSPITAL_COMMUNITY): Payer: Self-pay

## 2012-03-29 ENCOUNTER — Encounter (HOSPITAL_COMMUNITY): Payer: Medicare Other

## 2012-03-29 ENCOUNTER — Encounter (HOSPITAL_COMMUNITY): Payer: Self-pay

## 2012-04-01 ENCOUNTER — Encounter (HOSPITAL_COMMUNITY): Payer: Medicare Other

## 2012-04-01 ENCOUNTER — Encounter (HOSPITAL_COMMUNITY): Payer: Self-pay

## 2012-04-03 ENCOUNTER — Encounter (HOSPITAL_COMMUNITY): Payer: Self-pay

## 2012-04-03 ENCOUNTER — Encounter (HOSPITAL_COMMUNITY): Payer: Medicare Other

## 2012-04-05 ENCOUNTER — Encounter (HOSPITAL_COMMUNITY): Payer: Medicare Other

## 2012-04-05 ENCOUNTER — Encounter (HOSPITAL_COMMUNITY): Payer: Self-pay

## 2012-04-08 ENCOUNTER — Encounter (HOSPITAL_COMMUNITY): Payer: Medicare Other

## 2012-04-08 ENCOUNTER — Encounter (HOSPITAL_COMMUNITY): Payer: Self-pay

## 2012-04-10 ENCOUNTER — Encounter (HOSPITAL_COMMUNITY): Payer: Medicare Other

## 2012-04-10 ENCOUNTER — Encounter (HOSPITAL_COMMUNITY): Payer: Self-pay

## 2012-04-12 ENCOUNTER — Encounter (HOSPITAL_COMMUNITY): Payer: Medicare Other

## 2012-04-12 ENCOUNTER — Encounter (HOSPITAL_COMMUNITY): Payer: Self-pay

## 2012-04-15 ENCOUNTER — Encounter (HOSPITAL_COMMUNITY): Payer: Medicare Other

## 2012-04-15 ENCOUNTER — Encounter (HOSPITAL_COMMUNITY): Payer: Self-pay

## 2012-04-17 ENCOUNTER — Encounter (HOSPITAL_COMMUNITY): Payer: Medicare Other

## 2012-04-17 ENCOUNTER — Encounter (HOSPITAL_COMMUNITY): Payer: Self-pay

## 2012-04-19 ENCOUNTER — Encounter (HOSPITAL_COMMUNITY): Payer: Self-pay

## 2012-04-19 ENCOUNTER — Encounter (HOSPITAL_COMMUNITY): Payer: Medicare Other

## 2012-04-22 ENCOUNTER — Encounter (HOSPITAL_COMMUNITY): Payer: Medicare Other

## 2012-04-22 ENCOUNTER — Encounter (HOSPITAL_COMMUNITY): Payer: Self-pay

## 2012-04-24 ENCOUNTER — Encounter (HOSPITAL_COMMUNITY): Payer: Self-pay

## 2012-04-24 ENCOUNTER — Encounter (HOSPITAL_COMMUNITY): Payer: Medicare Other

## 2012-04-26 ENCOUNTER — Encounter (HOSPITAL_COMMUNITY): Payer: Medicare Other

## 2012-04-26 ENCOUNTER — Encounter (HOSPITAL_COMMUNITY): Payer: Self-pay

## 2012-04-29 ENCOUNTER — Encounter (HOSPITAL_COMMUNITY): Payer: Medicare Other

## 2012-04-29 ENCOUNTER — Encounter (HOSPITAL_COMMUNITY): Payer: Self-pay

## 2012-05-01 ENCOUNTER — Encounter (HOSPITAL_COMMUNITY): Payer: Medicare Other

## 2012-05-01 ENCOUNTER — Encounter (HOSPITAL_COMMUNITY): Payer: Self-pay

## 2012-05-03 ENCOUNTER — Encounter (HOSPITAL_COMMUNITY): Payer: Medicare Other

## 2012-05-03 ENCOUNTER — Encounter (HOSPITAL_COMMUNITY): Payer: Self-pay

## 2012-05-06 ENCOUNTER — Encounter (HOSPITAL_COMMUNITY): Payer: Self-pay

## 2012-05-06 ENCOUNTER — Encounter (HOSPITAL_COMMUNITY): Payer: Medicare Other

## 2012-05-08 ENCOUNTER — Encounter (HOSPITAL_COMMUNITY): Payer: Self-pay

## 2012-05-08 ENCOUNTER — Encounter (HOSPITAL_COMMUNITY): Payer: Medicare Other

## 2012-05-10 ENCOUNTER — Encounter (HOSPITAL_COMMUNITY): Payer: Medicare Other

## 2012-05-10 ENCOUNTER — Encounter (HOSPITAL_COMMUNITY): Payer: Self-pay

## 2012-05-13 ENCOUNTER — Encounter (HOSPITAL_COMMUNITY): Payer: Medicare Other

## 2012-05-13 ENCOUNTER — Encounter (HOSPITAL_COMMUNITY): Payer: Self-pay

## 2012-05-15 ENCOUNTER — Encounter (HOSPITAL_COMMUNITY): Payer: Medicare Other

## 2012-05-15 ENCOUNTER — Encounter (HOSPITAL_COMMUNITY): Payer: Self-pay

## 2012-05-17 ENCOUNTER — Encounter (HOSPITAL_COMMUNITY): Payer: Self-pay

## 2012-05-17 ENCOUNTER — Encounter (HOSPITAL_COMMUNITY): Payer: Medicare Other

## 2012-05-20 ENCOUNTER — Encounter (HOSPITAL_COMMUNITY): Payer: Medicare Other

## 2012-05-20 ENCOUNTER — Encounter (HOSPITAL_COMMUNITY): Payer: Self-pay

## 2012-05-22 ENCOUNTER — Encounter (HOSPITAL_COMMUNITY): Payer: Self-pay

## 2012-05-22 ENCOUNTER — Encounter (HOSPITAL_COMMUNITY): Payer: Medicare Other

## 2012-05-24 ENCOUNTER — Encounter (HOSPITAL_COMMUNITY): Payer: Medicare Other

## 2012-05-24 ENCOUNTER — Encounter (HOSPITAL_COMMUNITY): Payer: Self-pay

## 2012-05-27 ENCOUNTER — Encounter (HOSPITAL_COMMUNITY): Payer: Medicare Other

## 2012-05-27 ENCOUNTER — Encounter (HOSPITAL_COMMUNITY): Payer: Self-pay

## 2012-05-29 ENCOUNTER — Encounter (HOSPITAL_COMMUNITY): Payer: Self-pay

## 2012-05-29 ENCOUNTER — Encounter (HOSPITAL_COMMUNITY): Payer: Medicare Other

## 2012-05-31 ENCOUNTER — Encounter (HOSPITAL_COMMUNITY): Payer: Self-pay

## 2012-05-31 ENCOUNTER — Encounter (HOSPITAL_COMMUNITY): Payer: Medicare Other

## 2012-06-03 ENCOUNTER — Encounter (HOSPITAL_COMMUNITY): Payer: Medicare Other

## 2012-06-03 ENCOUNTER — Encounter (HOSPITAL_COMMUNITY): Payer: Self-pay

## 2012-06-05 ENCOUNTER — Encounter (HOSPITAL_COMMUNITY): Payer: Self-pay

## 2012-06-05 ENCOUNTER — Encounter (HOSPITAL_COMMUNITY): Payer: Medicare Other

## 2012-06-07 ENCOUNTER — Encounter (HOSPITAL_COMMUNITY): Payer: Self-pay

## 2012-06-07 ENCOUNTER — Encounter (HOSPITAL_COMMUNITY): Payer: Medicare Other

## 2012-06-10 ENCOUNTER — Encounter (HOSPITAL_COMMUNITY): Payer: Medicare Other

## 2012-06-10 ENCOUNTER — Encounter (HOSPITAL_COMMUNITY): Payer: Self-pay

## 2012-06-12 ENCOUNTER — Encounter (HOSPITAL_COMMUNITY): Payer: Medicare Other

## 2012-06-12 ENCOUNTER — Encounter (HOSPITAL_COMMUNITY): Payer: Self-pay

## 2012-06-14 ENCOUNTER — Encounter (HOSPITAL_COMMUNITY): Payer: Self-pay

## 2012-06-14 ENCOUNTER — Encounter (HOSPITAL_COMMUNITY): Payer: Medicare Other

## 2012-06-17 ENCOUNTER — Encounter (HOSPITAL_COMMUNITY): Payer: Self-pay

## 2012-06-17 ENCOUNTER — Encounter (HOSPITAL_COMMUNITY): Payer: Medicare Other

## 2012-06-19 ENCOUNTER — Encounter (HOSPITAL_COMMUNITY): Payer: Medicare Other

## 2012-06-19 ENCOUNTER — Encounter (HOSPITAL_COMMUNITY): Payer: Self-pay

## 2012-06-21 ENCOUNTER — Encounter (HOSPITAL_COMMUNITY): Payer: Medicare Other

## 2012-06-21 ENCOUNTER — Encounter (HOSPITAL_COMMUNITY): Payer: Self-pay

## 2012-06-24 ENCOUNTER — Encounter (HOSPITAL_COMMUNITY): Payer: Self-pay

## 2012-06-24 ENCOUNTER — Encounter (HOSPITAL_COMMUNITY): Payer: Medicare Other

## 2012-06-26 ENCOUNTER — Encounter (HOSPITAL_COMMUNITY): Payer: Medicare Other

## 2012-06-26 ENCOUNTER — Encounter (HOSPITAL_COMMUNITY): Payer: Self-pay

## 2012-06-28 ENCOUNTER — Encounter (HOSPITAL_COMMUNITY): Payer: Medicare Other

## 2012-06-28 ENCOUNTER — Encounter (HOSPITAL_COMMUNITY): Payer: Self-pay

## 2012-07-01 ENCOUNTER — Encounter (HOSPITAL_COMMUNITY): Payer: Self-pay

## 2012-07-01 ENCOUNTER — Encounter (HOSPITAL_COMMUNITY): Payer: Medicare Other

## 2012-07-03 ENCOUNTER — Encounter (HOSPITAL_COMMUNITY): Payer: Medicare Other

## 2012-07-03 ENCOUNTER — Encounter (HOSPITAL_COMMUNITY): Payer: Self-pay

## 2012-07-05 ENCOUNTER — Encounter (HOSPITAL_COMMUNITY): Payer: Self-pay

## 2012-07-05 ENCOUNTER — Encounter (HOSPITAL_COMMUNITY): Payer: Medicare Other

## 2012-07-08 ENCOUNTER — Encounter (HOSPITAL_COMMUNITY): Payer: Medicare Other

## 2012-07-08 ENCOUNTER — Encounter (HOSPITAL_COMMUNITY): Payer: Self-pay

## 2012-07-10 ENCOUNTER — Encounter (HOSPITAL_COMMUNITY): Payer: Self-pay

## 2012-07-10 ENCOUNTER — Encounter (HOSPITAL_COMMUNITY): Payer: Medicare Other

## 2012-07-12 ENCOUNTER — Encounter (HOSPITAL_COMMUNITY): Payer: Self-pay

## 2012-07-12 ENCOUNTER — Encounter (HOSPITAL_COMMUNITY): Payer: Medicare Other

## 2012-07-15 ENCOUNTER — Encounter (HOSPITAL_COMMUNITY): Payer: Self-pay

## 2012-07-15 ENCOUNTER — Encounter (HOSPITAL_COMMUNITY): Payer: Medicare Other

## 2012-07-17 ENCOUNTER — Encounter (HOSPITAL_COMMUNITY): Payer: Self-pay

## 2012-07-17 ENCOUNTER — Encounter (HOSPITAL_COMMUNITY): Payer: Medicare Other

## 2012-07-19 ENCOUNTER — Encounter (HOSPITAL_COMMUNITY): Payer: Medicare Other

## 2012-07-19 ENCOUNTER — Encounter (HOSPITAL_COMMUNITY): Payer: Self-pay

## 2012-07-22 ENCOUNTER — Encounter (HOSPITAL_COMMUNITY): Payer: Self-pay

## 2012-07-22 ENCOUNTER — Encounter (HOSPITAL_COMMUNITY): Payer: Medicare Other

## 2012-07-24 ENCOUNTER — Encounter (HOSPITAL_COMMUNITY): Payer: Medicare Other

## 2012-07-24 ENCOUNTER — Encounter (HOSPITAL_COMMUNITY): Payer: Self-pay

## 2012-07-26 ENCOUNTER — Encounter (HOSPITAL_COMMUNITY): Payer: Medicare Other

## 2012-07-26 ENCOUNTER — Encounter (HOSPITAL_COMMUNITY): Payer: Self-pay

## 2012-07-29 ENCOUNTER — Encounter (HOSPITAL_COMMUNITY): Payer: Self-pay

## 2012-07-29 ENCOUNTER — Encounter (HOSPITAL_COMMUNITY): Payer: Medicare Other

## 2012-07-31 ENCOUNTER — Encounter (HOSPITAL_COMMUNITY): Payer: Medicare Other

## 2012-07-31 ENCOUNTER — Encounter (HOSPITAL_COMMUNITY): Payer: Self-pay

## 2012-08-02 ENCOUNTER — Encounter (HOSPITAL_COMMUNITY): Payer: Self-pay

## 2012-08-02 ENCOUNTER — Encounter (HOSPITAL_COMMUNITY): Payer: Medicare Other

## 2012-10-11 ENCOUNTER — Ambulatory Visit (INDEPENDENT_AMBULATORY_CARE_PROVIDER_SITE_OTHER): Payer: Medicare Other | Admitting: Ophthalmology

## 2012-10-11 DIAGNOSIS — H35349 Macular cyst, hole, or pseudohole, unspecified eye: Secondary | ICD-10-CM

## 2012-10-11 DIAGNOSIS — H35039 Hypertensive retinopathy, unspecified eye: Secondary | ICD-10-CM

## 2012-10-11 DIAGNOSIS — I10 Essential (primary) hypertension: Secondary | ICD-10-CM

## 2012-10-11 DIAGNOSIS — H35379 Puckering of macula, unspecified eye: Secondary | ICD-10-CM

## 2012-10-11 DIAGNOSIS — H43819 Vitreous degeneration, unspecified eye: Secondary | ICD-10-CM

## 2012-10-11 DIAGNOSIS — H35359 Cystoid macular degeneration, unspecified eye: Secondary | ICD-10-CM

## 2012-11-28 ENCOUNTER — Other Ambulatory Visit: Payer: Self-pay | Admitting: Cardiovascular Disease

## 2012-12-11 ENCOUNTER — Encounter (INDEPENDENT_AMBULATORY_CARE_PROVIDER_SITE_OTHER): Payer: Medicare Other | Admitting: Ophthalmology

## 2012-12-11 DIAGNOSIS — I10 Essential (primary) hypertension: Secondary | ICD-10-CM

## 2012-12-11 DIAGNOSIS — H35039 Hypertensive retinopathy, unspecified eye: Secondary | ICD-10-CM

## 2012-12-11 DIAGNOSIS — H35379 Puckering of macula, unspecified eye: Secondary | ICD-10-CM

## 2012-12-11 DIAGNOSIS — H35349 Macular cyst, hole, or pseudohole, unspecified eye: Secondary | ICD-10-CM

## 2012-12-11 DIAGNOSIS — H2510 Age-related nuclear cataract, unspecified eye: Secondary | ICD-10-CM

## 2012-12-11 DIAGNOSIS — H35359 Cystoid macular degeneration, unspecified eye: Secondary | ICD-10-CM

## 2012-12-11 DIAGNOSIS — H43819 Vitreous degeneration, unspecified eye: Secondary | ICD-10-CM

## 2013-01-31 ENCOUNTER — Telehealth: Payer: Self-pay | Admitting: Cardiovascular Disease

## 2013-01-31 NOTE — Telephone Encounter (Signed)
Returned call and spoke w/ [name], pt's wife.  Stated pt has been having some knee pain, which she thinks may be arthritis.  Wanted to know if pt can take otc IBU for pain with his other meds.  Stated PCP is out of the office until August 11th.  Reviewed med list and pt is taking ASA 325 mg.  Advised RICE therapy, including IBU 400 mg q6-8 hr prn pain and alternate w/ tylenol 500 mg q6 hr prn breakthrough pain x 3-4 days.  If no improvement, will need to be seen PCP/[facility].  Verbalized understanding and agreed w/ plan.

## 2013-01-31 NOTE — Telephone Encounter (Signed)
Would like to know if she can give him some ibuprofen for the pain , not sure with all the cardiac medication that he takes . Please call

## 2013-03-13 ENCOUNTER — Encounter (INDEPENDENT_AMBULATORY_CARE_PROVIDER_SITE_OTHER): Payer: Medicare Other | Admitting: Ophthalmology

## 2013-03-18 ENCOUNTER — Encounter (INDEPENDENT_AMBULATORY_CARE_PROVIDER_SITE_OTHER): Payer: Medicare Other | Admitting: Ophthalmology

## 2013-03-18 DIAGNOSIS — H35359 Cystoid macular degeneration, unspecified eye: Secondary | ICD-10-CM

## 2013-03-18 DIAGNOSIS — H2510 Age-related nuclear cataract, unspecified eye: Secondary | ICD-10-CM

## 2013-03-18 DIAGNOSIS — H35379 Puckering of macula, unspecified eye: Secondary | ICD-10-CM

## 2013-03-18 DIAGNOSIS — H35349 Macular cyst, hole, or pseudohole, unspecified eye: Secondary | ICD-10-CM

## 2013-03-18 DIAGNOSIS — H43819 Vitreous degeneration, unspecified eye: Secondary | ICD-10-CM

## 2013-04-07 ENCOUNTER — Other Ambulatory Visit: Payer: Self-pay

## 2013-04-07 MED ORDER — AMLODIPINE BESYLATE 5 MG PO TABS: 5.0000 mg | ORAL_TABLET | Freq: Every day | ORAL | Status: DC

## 2013-04-07 NOTE — Telephone Encounter (Signed)
Rx was sent to pharmacy electronically. 

## 2013-04-16 ENCOUNTER — Encounter: Payer: Self-pay | Admitting: Cardiovascular Disease

## 2013-04-16 ENCOUNTER — Ambulatory Visit (INDEPENDENT_AMBULATORY_CARE_PROVIDER_SITE_OTHER): Payer: Medicare Other | Admitting: Cardiovascular Disease

## 2013-04-16 VITALS — BP 140/80 | HR 52 | Ht 69.0 in | Wt 202.5 lb

## 2013-04-16 DIAGNOSIS — Z951 Presence of aortocoronary bypass graft: Secondary | ICD-10-CM

## 2013-04-16 DIAGNOSIS — I2510 Atherosclerotic heart disease of native coronary artery without angina pectoris: Secondary | ICD-10-CM

## 2013-04-16 DIAGNOSIS — E785 Hyperlipidemia, unspecified: Secondary | ICD-10-CM

## 2013-04-16 DIAGNOSIS — I10 Essential (primary) hypertension: Secondary | ICD-10-CM

## 2013-04-16 NOTE — Progress Notes (Signed)
Patient ID: Shane Espinoza, male   DOB: 09-17-1940, 72 y.o.   MRN: [id number]     HPI: Shane Espinoza, is a 72 y.o. male who presents to the office for six-month cardiology evaluation.  Shane Espinoza underwent CABG revascularization surgery on 03/07/2011 after a nuclear perfusion study revealed significant scar/ischemia and cardiac catheterization demonstrated severe multivessel CAD. CABG surgery was done by Dr. Morton Espinoza on 03/07/2011 with LIMA to the LAD, vein to the OM, vein to the RV marginal, and vein to the PDA. He did develop PAF postoperatively and underwent TEE guided cardioversion with restoration of sinus rhythm. He completed cardiac rehabilitation. A nuclear perfusion study in August 2013 was markedly improved did not reveal any region of scar or ischemia. He has been off amiodarone and maintaining sinus rhythm. Additional problems include hypertension, hyperlipidemia, and in the past he's had issues with leg swelling.  Over the past 6 months, he has done well. He tells me he recently underwent colonoscopy and had 3 polyps. He denies chest pressure. He is unaware of tachycardia palpitations. At times he does note some mild sluggishness when he starts to walk.  Past Medical History  Diagnosis Date  . Hypertension   . Anxiety   . 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 Atrial fibrillation   . CAD (coronary artery disease)     Shane Espinoza  . Gout     Past Surgical History  Procedure Laterality Date  . Coronary artery bypass graft  03/07/2011    Shane  Espinoza  . Eye surgery    . Colon surgery    . Vascular doppler  03/03/2011    Nosignificant extracranial carotid artery stenosis  . Cardiac catheterization  03/02/2011    Recommended CABG  . Lexiscan myoview  02/20/2012    EKG negative for ischemia, noraml study  . Transthoracic echocardiogram  02/20/2012    EF 50-55%, mild-moderate concentric LVH, LA moderately dilated, moderate mitral regurg    No Known  Allergies  Current Outpatient Prescriptions  Medication Sig Dispense Refill  . amLODipine (NORVASC) 5 MG tablet Take 1 tablet (5 mg total) by mouth daily.  90 tablet  1  . aspirin 325 MG tablet Take 325 mg by mouth daily.      . beta carotene w/minerals (OCUVITE) tablet Take 1 tablet by mouth daily.        . Bromfenac Sodium 0.07 % SOLN Apply 1 drop to eye at bedtime.      . cholecalciferol (VITAMIN D) 1000 UNITS tablet Take 5,000 Units by mouth 3 (three) times a week. Monday, Wednesday, friday      . fish oil-omega-3 fatty acids 1000 MG capsule Take 1,200 mg by mouth daily.        . metoprolol tartrate (LOPRESSOR) 25 MG tablet Take 1 tablet (25mg  total) by mouth every morning and take 0.5 tablet (12.5mg  total) every evening.      . simvastatin (ZOCOR) 20 MG tablet Take 20 mg by mouth at bedtime.       . valsartan-hydrochlorothiazide (DIOVAN-HCT) 320-25 MG per tablet Take 1 tablet by mouth daily.      . [DISCONTINUED] amiodarone (PACERONE) 200 MG tablet Take 200 mg by mouth daily.        No current facility-administered medications for this visit.    History   Social History  . Marital Status: Married    Spouse Name: N/A    Number of Children: N/A  . Years  of Education: N/A   Occupational History  . Not on file.   Social History Main Topics  . Smoking status: Never Smoker   . Smokeless tobacco: Never Used  . Alcohol Use: No  . Drug Use: No  . Sexual Activity: Not on file   Other Topics Concern  . Not on file   Social History Narrative  . No narrative on file    Family History  Problem Relation Age of Onset  . CVA Father   . Heart attack Maternal Grandmother   . Heart attack Maternal Grandfather   . Heart murmur Sister   . Hypertension Son    Socially he is married has 2 children 4 grandchildren. He now exercises fairly regularly. There is no tobacco or alcohol use.  ROS is negative for fevers, chills or night sweats. He denies visual symptoms. He denies cough. He  denies wheezing. He denies syncope. He denies chest pressure. He does have hemorrhoids. Apparently is undergoing hemorrhoidal banding by Dr. Kinnie Scales. He was found recently to have 3 polyps and underwent colonoscopy. He denies abdominal pain nausea vomiting or diarrhea. He denies hematuria. He denies claudication symptoms. He has noticed for her charley horses in his legs. He denies endocrine issues. Other comprehensive 12 point system review is negative.  PE BP 140/80  Pulse 52  Ht 5\' 9"  (1.753 m)  Wt 202 lb 8 oz (91.853 kg)  BMI 29.89 kg/m2  General: Alert, oriented, no distress.  Skin: normal turgor, no rashes HEENT: Normocephalic, atraumatic. Pupils round and reactive; sclera anicteric;no lid lag.  Nose without nasal septal hypertrophy Mouth/Parynx benign; Mallinpatti scale 3 Neck: No JVD, no carotid briuts Lungs: clear to ausculatation and percussion; no wheezing or rales Heart: Bradycardic at 52, s1 s2 normal  1/6 sem Abdomen: Mild central adiposity ; soft, nontender; no hepatosplenomehaly, BS+; abdominal aorta nontender and not dilated by palpation. Pulses 2+ Extremities: no clubbing cyanosis or edema, Homan's sign negative  Neurologic: grossly nonfocal Psychologic: Normal affect and mood  ECG: Sinus bradycardia at 52 beats per minute.  LABS:  BMET    Component Value Date/Time   NA 136 11/13/2011 1410   K 3.7 11/13/2011 1410   CL 98 11/13/2011 1410   CO2 28 11/13/2011 1410   GLUCOSE 105* 11/13/2011 1410   BUN 17 11/13/2011 1410   CREATININE 0.90 11/13/2011 1410   CALCIUM 9.2 11/13/2011 1410   GFRNONAA 83* 11/13/2011 1410   GFRAA >90 11/13/2011 1410     Hepatic Function Panel     Component Value Date/Time   PROT 6.6 03/03/2011 1508   ALBUMIN 2.5* 03/09/2011 0406   AST 18 03/03/2011 1508   ALT 21 03/03/2011 1508   ALKPHOS 45 03/03/2011 1508   BILITOT 0.7 03/03/2011 1508     CBC    Component Value Date/Time   WBC 9.0 11/13/2011 1410   RBC 4.68 11/13/2011 1410   HGB 13.9  11/13/2011 1410   HCT 41.2 11/13/2011 1410   PLT 218 11/13/2011 1410   MCV 88.0 11/13/2011 1410   MCH 29.7 11/13/2011 1410   MCHC 33.7 11/13/2011 1410   RDW 13.9 11/13/2011 1410     BNP No results found for this basename: probnp    Lipid Panel  No results found for this basename: chol, trig, hdl, cholhdl, vldl, ldlcalc     RADIOLOGY: No results found.    ASSESSMENT AND PLAN: Mr. Reiley is now 2 years status post CABG revascularization surgery for severe multivessel CAD. I did  review the laboratory that he recently had done in June by his primary physician. Glucose was mildly increased at 109. Cholesterol is 119 triglycerides 124 LDL 61 HDL 37.  He had normal renal function. He is not having any anginal symptoms or shortness of breath. His blood pressure is well-controlled. Presently, he is bradycardic. I am reducing his Lopressor from 25 mg in the morning and 12 point 5 at night to 12.5 mg twice a day. Also reducing his aspirin to 81 mg. Discussed weight loss. We discussed improvement in core abdominal strength. He will continue to exercise. I will see him in 6 months for followup evaluation.     Lennette Bihari, MD, [facility]  04/16/2013 9:36 AM

## 2013-04-16 NOTE — Patient Instructions (Addendum)
Your physician has recommended you make the following change in your medication: cut the metoprolol down to 12.5 mg twice daily. Cut the aspirin down to 81 mg daily  Your physician recommends that you schedule a follow-up appointment in: 6 MONTHS.

## 2013-04-17 ENCOUNTER — Encounter: Payer: Self-pay | Admitting: Cardiovascular Disease

## 2013-04-25 ENCOUNTER — Other Ambulatory Visit: Payer: Self-pay | Admitting: Cardiovascular Disease

## 2013-04-25 ENCOUNTER — Telehealth: Payer: Self-pay | Admitting: Cardiovascular Disease

## 2013-04-25 MED ORDER — VALSARTAN-HYDROCHLOROTHIAZIDE 320-25 MG PO TABS: 1.0000 | ORAL_TABLET | Freq: Every day | ORAL | Status: DC

## 2013-04-25 MED ORDER — AMLODIPINE BESYLATE 5 MG PO TABS: 5.0000 mg | ORAL_TABLET | Freq: Every day | ORAL | Status: DC

## 2013-04-25 MED ORDER — SIMVASTATIN 20 MG PO TABS: 20.0000 mg | ORAL_TABLET | Freq: Every day | ORAL | Status: DC

## 2013-04-25 NOTE — Telephone Encounter (Signed)
Pt/wife stated he does not need metoprolol.  Rxs for amlodipine, simvastatin and valsartan-hct sent.  Thank you.

## 2013-04-25 NOTE — Telephone Encounter (Signed)
Returned call and pt verified x 2.  Pt/wife on phone.  Need refills for amlodipine, simvastatin and valsartan-hct sent to [facility] for 90-day supplies, not [facility].  Informed refill request received today for metoprolol and simvastatin.  Wife stated that is not what she told them.  Stated pt does not need metoprolol.  Refill(s) sent to mail order pharmacy after confirming dosages w/ pt/wife.  Pt/wife verbalized understanding and agreed w/ plan.

## 2013-04-25 NOTE — Telephone Encounter (Signed)
Please call-concerning one of his medicine.

## 2013-06-24 ENCOUNTER — Other Ambulatory Visit: Payer: Self-pay | Admitting: *Deleted

## 2013-06-24 MED ORDER — SIMVASTATIN 20 MG PO TABS: 20.0000 mg | ORAL_TABLET | Freq: Every day | ORAL | Status: DC

## 2013-06-24 MED ORDER — METOPROLOL TARTRATE 25 MG PO TABS: 12.5000 mg | ORAL_TABLET | Freq: Two times a day (BID) | ORAL | Status: DC

## 2013-06-24 MED ORDER — VALSARTAN-HYDROCHLOROTHIAZIDE 320-25 MG PO TABS: 1.0000 | ORAL_TABLET | Freq: Every day | ORAL | Status: DC

## 2013-06-24 MED ORDER — AMLODIPINE BESYLATE 5 MG PO TABS: 5.0000 mg | ORAL_TABLET | Freq: Every day | ORAL | Status: DC

## 2013-06-24 NOTE — Telephone Encounter (Signed)
Rx was sent to pharmacy electronically. 

## 2013-09-15 ENCOUNTER — Ambulatory Visit (INDEPENDENT_AMBULATORY_CARE_PROVIDER_SITE_OTHER): Payer: Medicare Other | Admitting: Ophthalmology

## 2013-09-15 DIAGNOSIS — I10 Essential (primary) hypertension: Secondary | ICD-10-CM

## 2013-09-15 DIAGNOSIS — H43819 Vitreous degeneration, unspecified eye: Secondary | ICD-10-CM

## 2013-09-15 DIAGNOSIS — H35349 Macular cyst, hole, or pseudohole, unspecified eye: Secondary | ICD-10-CM

## 2013-09-15 DIAGNOSIS — H35379 Puckering of macula, unspecified eye: Secondary | ICD-10-CM

## 2013-09-15 DIAGNOSIS — H2510 Age-related nuclear cataract, unspecified eye: Secondary | ICD-10-CM

## 2013-09-15 DIAGNOSIS — H35039 Hypertensive retinopathy, unspecified eye: Secondary | ICD-10-CM

## 2013-09-15 DIAGNOSIS — H35359 Cystoid macular degeneration, unspecified eye: Secondary | ICD-10-CM

## 2013-09-23 ENCOUNTER — Inpatient Hospital Stay (HOSPITAL_COMMUNITY)
Admission: EM | Admit: 2013-09-23 | Discharge: 2013-09-25 | DRG: 066 | Disposition: A | Discharge status: Home / Self Care | Payer: Medicare Other | Attending: Internal Medicine | Admitting: Internal Medicine

## 2013-09-23 ENCOUNTER — Encounter (HOSPITAL_COMMUNITY): Payer: Self-pay | Admitting: Emergency Medicine

## 2013-09-23 ENCOUNTER — Emergency Department (HOSPITAL_COMMUNITY): Payer: Medicare Other

## 2013-09-23 DIAGNOSIS — Z79899 Other long term (current) drug therapy: Secondary | ICD-10-CM

## 2013-09-23 DIAGNOSIS — R4789 Other speech disturbances: Secondary | ICD-10-CM | POA: Diagnosis present

## 2013-09-23 DIAGNOSIS — Z823 Family history of stroke: Secondary | ICD-10-CM

## 2013-09-23 DIAGNOSIS — I4891 Unspecified atrial fibrillation: Secondary | ICD-10-CM | POA: Diagnosis present

## 2013-09-23 DIAGNOSIS — I639 Cerebral infarction, unspecified: Secondary | ICD-10-CM | POA: Diagnosis present

## 2013-09-23 DIAGNOSIS — E669 Obesity, unspecified: Secondary | ICD-10-CM | POA: Diagnosis present

## 2013-09-23 DIAGNOSIS — I2510 Atherosclerotic heart disease of native coronary artery without angina pectoris: Secondary | ICD-10-CM | POA: Diagnosis present

## 2013-09-23 DIAGNOSIS — Z7982 Long term (current) use of aspirin: Secondary | ICD-10-CM

## 2013-09-23 DIAGNOSIS — Z6830 Body mass index (BMI) 30.0-30.9, adult: Secondary | ICD-10-CM

## 2013-09-23 DIAGNOSIS — Z8673 Personal history of transient ischemic attack (TIA), and cerebral infarction without residual deficits: Secondary | ICD-10-CM

## 2013-09-23 DIAGNOSIS — Z951 Presence of aortocoronary bypass graft: Secondary | ICD-10-CM

## 2013-09-23 DIAGNOSIS — I6330 Cerebral infarction due to thrombosis of unspecified cerebral artery: Principal | ICD-10-CM | POA: Diagnosis present

## 2013-09-23 DIAGNOSIS — I10 Essential (primary) hypertension: Secondary | ICD-10-CM | POA: Diagnosis present

## 2013-09-23 DIAGNOSIS — F411 Generalized anxiety disorder: Secondary | ICD-10-CM | POA: Diagnosis present

## 2013-09-23 DIAGNOSIS — E785 Hyperlipidemia, unspecified: Secondary | ICD-10-CM | POA: Diagnosis present

## 2013-09-23 DIAGNOSIS — M109 Gout, unspecified: Secondary | ICD-10-CM | POA: Diagnosis present

## 2013-09-23 DIAGNOSIS — R471 Dysarthria and anarthria: Secondary | ICD-10-CM

## 2013-09-23 LAB — I-STAT CHEM 8, ED
BUN: 19 mg/dL (ref 6–23)
CALCIUM ION: 1.22 mmol/L (ref 1.13–1.30)
Chloride: 103 mEq/L (ref 96–112)
Creatinine, Ser: 1.1 mg/dL (ref 0.50–1.35)
Glucose, Bld: 174 mg/dL — ABNORMAL HIGH (ref 70–99)
HCT: 48 % (ref 39.0–52.0)
Hemoglobin: 16.3 g/dL (ref 13.0–17.0)
Potassium: 3 mEq/L — ABNORMAL LOW (ref 3.7–5.3)
Sodium: 144 mEq/L (ref 137–147)
TCO2: 25 mmol/L (ref 0–100)

## 2013-09-23 LAB — CBC
HCT: 46.4 % (ref 39.0–52.0)
Hemoglobin: 15.7 g/dL (ref 13.0–17.0)
MCH: 30.4 pg (ref 26.0–34.0)
MCHC: 33.8 g/dL (ref 30.0–36.0)
MCV: 89.7 fL (ref 78.0–100.0)
PLATELETS: 226 10*3/uL (ref 150–400)
RBC: 5.17 MIL/uL (ref 4.22–5.81)
RDW: 13.6 % (ref 11.5–15.5)
WBC: 9.2 10*3/uL (ref 4.0–10.5)

## 2013-09-23 LAB — COMPREHENSIVE METABOLIC PANEL
ALT: 28 U/L (ref 0–53)
AST: 30 U/L (ref 0–37)
Albumin: 3.7 g/dL (ref 3.5–5.2)
Alkaline Phosphatase: 61 U/L (ref 39–117)
BILIRUBIN TOTAL: 0.6 mg/dL (ref 0.3–1.2)
BUN: 19 mg/dL (ref 6–23)
CO2: 24 mEq/L (ref 19–32)
CREATININE: 0.95 mg/dL (ref 0.50–1.35)
Calcium: 9.9 mg/dL (ref 8.4–10.5)
Chloride: 101 mEq/L (ref 96–112)
GFR calc Af Amer: >90 mL/min (ref >90)
GFR calc non Af Amer: 81 mL/min — ABNORMAL LOW (ref >90)
Glucose, Bld: 165 mg/dL — ABNORMAL HIGH (ref 70–99)
POTASSIUM: 3.2 meq/L — AB (ref 3.7–5.3)
Sodium: 143 mEq/L (ref 137–147)
Total Protein: 7.2 g/dL (ref 6.0–8.3)

## 2013-09-23 LAB — DIFFERENTIAL
BASOS ABS: 0 10*3/uL (ref 0.0–0.1)
Basophils Relative: 0 % (ref 0–1)
Eosinophils Absolute: 0.4 10*3/uL (ref 0.0–0.7)
Eosinophils Relative: 4 % (ref 0–5)
LYMPHS ABS: 3.6 10*3/uL (ref 0.7–4.0)
LYMPHS PCT: 39 % (ref 12–46)
MONOS PCT: 10 % (ref 3–12)
Monocytes Absolute: 0.9 10*3/uL (ref 0.1–1.0)
NEUTROS PCT: 47 % (ref 43–77)
Neutro Abs: 4.3 10*3/uL (ref 1.7–7.7)

## 2013-09-23 LAB — I-STAT TROPONIN, ED: Troponin i, poc: 0.01 ng/mL (ref 0.00–0.08)

## 2013-09-23 LAB — CBG MONITORING, ED: GLUCOSE-CAPILLARY: 156 mg/dL — AB (ref 70–99)

## 2013-09-23 MED ORDER — VITAMIN D3 25 MCG (1000 UNIT) PO TABS
5000.0000 [IU] | ORAL_TABLET | ORAL | Status: DC
  Administered 2013-09-24: 5000 [IU] via ORAL
  Filled 2013-09-23 (×2): qty 5

## 2013-09-23 MED ORDER — ASPIRIN EC 81 MG PO TBEC
81.0000 mg | DELAYED_RELEASE_TABLET | Freq: Every day | ORAL | Status: DC
  Administered 2013-09-23 – 2013-09-25 (×3): 81 mg via ORAL
  Filled 2013-09-23 (×3): qty 1

## 2013-09-23 MED ORDER — OCUVITE PO TABS
1.0000 | ORAL_TABLET | Freq: Every day | ORAL | Status: DC
  Administered 2013-09-24 – 2013-09-25 (×2): 1 via ORAL
  Filled 2013-09-23 (×2): qty 1

## 2013-09-23 MED ORDER — ENOXAPARIN SODIUM 30 MG/0.3ML ~~LOC~~ SOLN
30.0000 mg | SUBCUTANEOUS | Status: DC
  Administered 2013-09-23: 30 mg via SUBCUTANEOUS
  Filled 2013-09-23 (×2): qty 0.3

## 2013-09-23 MED ORDER — OMEGA-3-ACID ETHYL ESTERS 1 G PO CAPS
1.0000 g | ORAL_CAPSULE | Freq: Every day | ORAL | Status: DC
  Administered 2013-09-24 – 2013-09-25 (×2): 1 g via ORAL
  Filled 2013-09-23 (×2): qty 1

## 2013-09-23 MED ORDER — SODIUM CHLORIDE 0.9 % IV SOLN
INTRAVENOUS | Status: DC
  Administered 2013-09-23: via INTRAVENOUS

## 2013-09-23 MED ORDER — OMEGA-3 FATTY ACIDS 1000 MG PO CAPS: 1200.0000 mg | ORAL_CAPSULE | Freq: Every day | ORAL | Status: DC

## 2013-09-23 MED ORDER — METOPROLOL TARTRATE 12.5 MG HALF TABLET
12.5000 mg | ORAL_TABLET | Freq: Two times a day (BID) | ORAL | Status: DC
  Administered 2013-09-23 – 2013-09-25 (×4): 12.5 mg via ORAL
  Filled 2013-09-23 (×5): qty 1

## 2013-09-23 MED ORDER — POTASSIUM CHLORIDE CRYS ER 20 MEQ PO TBCR
40.0000 meq | EXTENDED_RELEASE_TABLET | Freq: Once | ORAL | Status: AC
  Administered 2013-09-23: 40 meq via ORAL
  Filled 2013-09-23: qty 2

## 2013-09-23 MED ORDER — SIMVASTATIN 20 MG PO TABS
20.0000 mg | ORAL_TABLET | Freq: Every day | ORAL | Status: DC
  Administered 2013-09-23 – 2013-09-24 (×2): 20 mg via ORAL
  Filled 2013-09-23 (×3): qty 1

## 2013-09-23 MED ORDER — AMLODIPINE BESYLATE 5 MG PO TABS
5.0000 mg | ORAL_TABLET | Freq: Every day | ORAL | Status: DC
  Administered 2013-09-24 – 2013-09-25 (×2): 5 mg via ORAL
  Filled 2013-09-23 (×2): qty 1

## 2013-09-23 MED ORDER — SENNOSIDES-DOCUSATE SODIUM 8.6-50 MG PO TABS: 1.0000 | ORAL_TABLET | Freq: Every evening | ORAL | Status: DC | PRN

## 2013-09-23 NOTE — Discharge Instructions (Signed)
MRI of your head normal. Followup your primary care Dr.

## 2013-09-23 NOTE — ED Provider Notes (Addendum)
CSN: [id number]     Arrival date & time 09/23/13  1339 History   First MD Initiated Contact with Patient 09/23/13 1459     Chief Complaint  Patient presents with  . Aphasia    @EDPCLEARED @ (Consider location/radiation/quality/duration/timing/severity/associated sxs/prior Treatment) HPI..... level V caveat for urgent need for intervention.  Intermittent episodes of transient slurred speech since 11 AM.   Patient was also trying to count his money and was unable to do so. No arm or leg weakness or facial asymmetry.   He's feeling slightly better now.   Nothing makes symptoms better or worse.  Past Medical History  Diagnosis Date  . Hypertension   . Anxiety   . 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 Atrial fibrillation   . CAD (coronary artery disease)     [name]  . Gout    Past Surgical History  Procedure Laterality Date  . Coronary artery bypass graft  03/07/2011    DR.VAN  TRIGHT  . Eye surgery    . Colon surgery    . Vascular doppler  03/03/2011    Nosignificant extracranial carotid artery stenosis  . Cardiac catheterization  03/02/2011    Recommended CABG  . Lexiscan myoview  02/20/2012    EKG negative for ischemia, noraml study  . Transthoracic echocardiogram  02/20/2012    EF 50-55%, mild-moderate concentric LVH, LA moderately dilated, moderate mitral regurg   Family History  Problem Relation Age of Onset  . CVA Father   . Heart attack Maternal Grandmother   . Heart attack Maternal Grandfather   . Heart murmur Sister   . Hypertension Son    History  Substance Use Topics  . Smoking status: Never Smoker   . Smokeless tobacco: Never Used  . Alcohol Use: No    Review of Systems  Unable to perform ROS: Acuity of condition      Allergies  Review of patient's allergies indicates no known allergies.  Home Medications   Current Outpatient Rx  Name  Route  Sig  Dispense  Refill  . amLODipine (NORVASC) 5 MG tablet   Oral   Take 1 tablet (5 mg total) by  mouth daily.   90 tablet   3   . aspirin 325 MG tablet   Oral   Take 325 mg by mouth daily.         . beta carotene w/minerals (OCUVITE) tablet   Oral   Take 1 tablet by mouth daily.           . Bromfenac Sodium 0.07 % SOLN   Ophthalmic   Apply 1 drop to eye at bedtime.         . cholecalciferol (VITAMIN D) 1000 UNITS tablet   Oral   Take 5,000 Units by mouth 3 (three) times a week. Monday, Wednesday, friday         . fish oil-omega-3 fatty acids 1000 MG capsule   Oral   Take 1,200 mg by mouth daily.           . metoprolol tartrate (LOPRESSOR) 25 MG tablet   Oral   Take 0.5 tablets (12.5 mg total) by mouth 2 (two) times daily.   90 tablet   3   . simvastatin (ZOCOR) 20 MG tablet   Oral   Take 1 tablet (20 mg total) by mouth at bedtime.   90 tablet   3   . valsartan-hydrochlorothiazide (DIOVAN-HCT) 320-25 MG per tablet  Oral   Take 1 tablet by mouth daily.   90 tablet   3    BP 128/66  Pulse 84  Temp(Src) 97.4 F (36.3 C) (Oral)  Resp 14  Ht 5\' 9"  (1.753 m)  Wt 206 lb 11.2 oz (93.759 kg)  BMI 30.51 kg/m2  SpO2 98% Physical Exam  Nursing note and vitals reviewed. Constitutional: He is oriented to person, place, and time. He appears well-developed and well-nourished.  HENT:  Head: Normocephalic and atraumatic.  Eyes: Conjunctivae and EOM are normal. Pupils are equal, round, and reactive to light.  Neck: Normal range of motion. Neck supple.  Cardiovascular: Normal rate, regular rhythm and normal heart sounds.   Pulmonary/Chest: Effort normal and breath sounds normal.  Abdominal: Soft. Bowel sounds are normal.  Musculoskeletal: Normal range of motion.  Neurological: He is alert and oriented to person, place, and time.  ? Slurred speech  Skin: Skin is warm and dry.  Psychiatric: He has a normal mood and affect. His behavior is normal.    ED Course  Procedures (including critical care time) Labs Review Labs Reviewed  CBG MONITORING, ED -  Abnormal; Notable for the following:    Glucose-Capillary 156 (*)    All other components within normal limits  I-STAT CHEM 8, ED - Abnormal; Notable for the following:    Potassium 3.0 (*)    Glucose, Bld 174 (*)    All other components within normal limits  CBC  DIFFERENTIAL  COMPREHENSIVE METABOLIC PANEL  I-STAT TROPOININ, ED   Imaging Review Ct Head (brain) Wo Contrast  09/23/2013   CLINICAL DATA:  Slurred speech  EXAM: CT HEAD WITHOUT CONTRAST  TECHNIQUE: Contiguous axial images were obtained from the base of the skull through the vertex without intravenous contrast.  COMPARISON:  Prior CT from 11/13/2011  FINDINGS: There is no acute intracranial hemorrhage or infarct. No mass lesion or midline shift. Gray-white matter differentiation is well maintained. Ventricles are normal in size without evidence of hydrocephalus. CSF containing spaces are within normal limits. No extra-axial fluid collection. Atrophy with chronic microvascular ischemic disease again noted, slightly progressed.  The calvarium is intact.  Orbital soft tissues are within normal limits.  The paranasal sinuses and mastoid air cells are well pneumatized and free of fluid.  Scalp soft tissues are unremarkable.  IMPRESSION: 1. No acute intracranial process. 2. Atrophy with chronic microvascular ischemic disease, slightly progressed relative to prior CT from 11/13/2011 Critical Value/emergent results were called by telephone at the time of interpretation on 09/23/2013 at 3:00 PM to Dr. Kathrynn Speed , who verbally acknowledged these results.   Electronically Signed   By: Jeannine Boga M.D.   On: 09/23/2013 15:03     EKG Interpretation   Date/Time:  Tuesday September 23 2013 13:57:59 EDT Ventricular Rate:  73 PR Interval:  176 QRS Duration: 88 QT Interval:  408 QTC Calculation: 449 R Axis:   100 Text Interpretation:  Sinus rhythm with Premature atrial complexes  Rightward axis Nonspecific ST abnormality Abnormal  ECG Confirmed by Tarrie Mcmichen   MD, Makenlee Mckeag ([id number]) on 09/23/2013 3:00:21 PM      MDM   Final diagnoses:  None   Code stroke called.  Neurology consult Dr Leonel Ramsay.  Consultant states if MRI normal, pt can go home.  Discussed with Dr Reather Converse  MRI/MRA reveals a tiny acute hemorrhagic mid left peri-operculum ischemic area. Patient will be admitted.  Nat Christen, MD 09/23/13 [place], MD 09/24/13 1314

## 2013-09-23 NOTE — ED Notes (Signed)
PT HAS RETURNED FROM MRI. DINNER READY FOR HIM. NO CHANGE IN ASSESSMENT

## 2013-09-23 NOTE — ED Notes (Signed)
CODE STROKE CALLED

## 2013-09-23 NOTE — ED Notes (Signed)
Pt and family reports onset approx 1100 of pt having episode of slurred speech and was counting money when he started having difficulty with the counting sequence, episode lasted < 1 minute. Reports that all episodes resolved and then started again after 1:30 and again only lasted a few seconds. At this time, speech is clear. No arm drift, no facial droop noted, grips are equal.

## 2013-09-23 NOTE — ED Provider Notes (Signed)
Patient with high blood pressure, CABG, A. fib, CAD history presented with slurred speech and difficulty counting money prior to arrival. Patient had a neurology stroke workup. Patient is signed out to myself to followup imaging and clinical exam for disposition. On evaluation patient felt overall okay, intermittent dysarthria but denies any other symptoms at this time. Radiology called with MRI results showing tiny hemorrhagic stroke. I discussed results with the patient and paged hospitalist for admission for further treatment and evaluation. Neurology has been following as well. Patient is only on aspirin for blood thinners. Last blood pressure normal at this time. Filed Vitals:   09/23/13 1730 09/23/13 1735 09/23/13 1838 09/23/13 1900  BP: 128/62 128/62 134/60 129/76  Pulse: 76 63    Temp:      TempSrc:      Resp: 19 20  21   Height:      Weight:      SpO2: 100% 100%     Slurred speech, hemorrhagic stroke  Mariea Clonts, MD 09/23/13 1939

## 2013-09-23 NOTE — ED Notes (Signed)
Meal ordered. Pt updated on wait for mri

## 2013-09-23 NOTE — Code Documentation (Signed)
Code Stroke called at 1439  Patient with difficulty word finding and slurred speech at home.  Last known well at 11:00am.  Arrived at hospital via private at 1339.  Head ct done, nihss 1, slurred speech. Patient with history of AF and AFl post open heart surgery.  Currently SR with frequent PACs and occ PVC.  Plan MRI.

## 2013-09-23 NOTE — H&P (Signed)
[facility] History and Physical  Shane Espinoza WLN:[id number] DOB: 12-14-1940 DOA: 09/23/2013  Referring physician: ER physician. PCP: Henrine Screws, MD  Specialists: Dr.Kelly. Cardiologist.  Chief Complaint: Difficulty speaking.  HPI: Shane Espinoza is a 73 y.o. male with history of CAD status post CVA, hypertension, hyperlipidemia and atrial fibrillation started experiencing difficulty speaking around 11:30 AM today. Patient also was having difficulty processing thoughts. This persisted and his wife called the primary care physician's office and was advised to go to the ER. Patient came to the ER by around 2:00 PM as per the family. CT head did not show anything acute. Patient was seen by neurologist. And they have recommended MRI brain which showed tiny left opperculum hemorrhage infarct which as per the neurologist may be thrombotic. Patient did not have any loss of function of the upper lower extremities did not have any dizziness headache visual symptoms difficulty swallowing. Denies any chest pain palpitations or any shortness of breath nausea vomiting.  R only on he he and [address] Systems: As presented in the history of presenting illness, rest negative.  Past Medical History  Diagnosis Date  . Hypertension   . Anxiety   . 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 Atrial fibrillation   . CAD (coronary artery disease)     [name]  . Gout    Past Surgical History  Procedure Laterality Date  . Coronary artery bypass graft  03/07/2011    DR.VAN  TRIGHT  . Eye surgery    . Colon surgery    . Vascular doppler  03/03/2011    Nosignificant extracranial carotid artery stenosis  . Cardiac catheterization  03/02/2011    Recommended CABG  . Lexiscan myoview  02/20/2012    EKG negative for ischemia, noraml study  . Transthoracic echocardiogram  02/20/2012    EF 50-55%, mild-moderate concentric LVH, LA moderately dilated, moderate mitral regurg   Social  History:  reports that he has never smoked. He has never used smokeless tobacco. He reports that he does not drink alcohol or use illicit drugs. Where does patient live home. Can patient participate in ADLs? Yes.  No Known Allergies  Family History:  Family History  Problem Relation Age of Onset  . CVA Father   . Heart attack Maternal Grandmother   . Heart attack Maternal Grandfather   . Heart murmur Sister   . Hypertension Son       Prior to Admission medications   Medication Sig Start Date End Date Taking? Authorizing Provider  amLODipine (NORVASC) 5 MG tablet Take 1 tablet (5 mg total) by mouth daily. 06/24/13  Yes Troy Sine, MD  aspirin 81 MG EC tablet Take 81 mg by mouth daily. Swallow whole.   Yes Historical Provider, MD  beta carotene w/minerals (OCUVITE) tablet Take 1 tablet by mouth daily.     Yes Historical Provider, MD  cholecalciferol (VITAMIN D) 1000 UNITS tablet Take 5,000 Units by mouth 3 (three) times a week. Monday, Wednesday, friday   Yes Historical Provider, MD  fish oil-omega-3 fatty acids 1000 MG capsule Take 1,200 mg by mouth daily.     Yes Historical Provider, MD  metoprolol tartrate (LOPRESSOR) 25 MG tablet Take 0.5 tablets (12.5 mg total) by mouth 2 (two) times daily. 06/24/13  Yes Troy Sine, MD  simvastatin (ZOCOR) 20 MG tablet Take 1 tablet (20 mg total) by mouth at bedtime. 06/24/13  Yes Troy Sine, MD  valsartan-hydrochlorothiazide (  DIOVAN-HCT) 320-25 MG per tablet Take 1 tablet by mouth daily. 06/24/13  Yes Troy Sine, MD    Physical Exam: Filed Vitals:   09/23/13 1838 09/23/13 1900 09/23/13 2030 09/23/13 2100  BP: 134/60 129/76 130/78 145/76  Pulse:    71  Temp:    98.3 F (36.8 C)  TempSrc:    Oral  Resp:  21 20 20   Height:    5\' 9"  (1.753 m)  Weight:    92.8 kg (204 lb 9.4 oz)  SpO2:    98%     General:  Well-developed and nourished.  Eyes: Anicteric no pallor.  ENT: No discharge from ears eyes nose mouth.  Neck: No  mass felt.  Cardiovascular: S1-S2 heard.  Respiratory: No rhonchi or crepitations.  Abdomen: Soft nontender bowel sounds present. No guarding or rigidity.  Skin: No rash.  Musculoskeletal: No edema.  Psychiatric: Appears normal.  Neurologic: Alert and oriented to time place and person. Moves all extremities 5 x 5. No facial asymmetry. Tongue is midline. PERRLA positive. Patient still has slurred speech.  Labs on Admission:  Basic Metabolic Panel:  Recent Labs Lab 09/23/13 1450 09/23/13 1515  NA 143 144  K 3.2* 3.0*  CL 101 103  CO2 24  --   GLUCOSE 165* 174*  BUN 19 19  CREATININE 0.95 1.10  CALCIUM 9.9  --    Liver Function Tests:  Recent Labs Lab 09/23/13 1450  AST 30  ALT 28  ALKPHOS 61  BILITOT 0.6  PROT 7.2  ALBUMIN 3.7   No results found for this basename: LIPASE, AMYLASE,  in the last 168 hours No results found for this basename: AMMONIA,  in the last 168 hours CBC:  Recent Labs Lab 09/23/13 1450 09/23/13 1515  WBC 9.2  --   NEUTROABS 4.3  --   HGB 15.7 16.3  HCT 46.4 48.0  MCV 89.7  --   PLT 226  --    Cardiac Enzymes: No results found for this basename: CKTOTAL, CKMB, CKMBINDEX, TROPONINI,  in the last 168 hours  BNP (last 3 results) No results found for this basename: PROBNP,  in the last 8760 hours CBG:  Recent Labs Lab 09/23/13 1509  GLUCAP 156*    Radiological Exams on Admission: Ct Head (brain) Wo Contrast  09/23/2013   CLINICAL DATA:  Slurred speech  EXAM: CT HEAD WITHOUT CONTRAST  TECHNIQUE: Contiguous axial images were obtained from the base of the skull through the vertex without intravenous contrast.  COMPARISON:  Prior CT from 11/13/2011  FINDINGS: There is no acute intracranial hemorrhage or infarct. No mass lesion or midline shift. Gray-white matter differentiation is well maintained. Ventricles are normal in size without evidence of hydrocephalus. CSF containing spaces are within normal limits. No extra-axial fluid  collection. Atrophy with chronic microvascular ischemic disease again noted, slightly progressed.  The calvarium is intact.  Orbital soft tissues are within normal limits.  The paranasal sinuses and mastoid air cells are well pneumatized and free of fluid.  Scalp soft tissues are unremarkable.  IMPRESSION: 1. No acute intracranial process. 2. Atrophy with chronic microvascular ischemic disease, slightly progressed relative to prior CT from 11/13/2011 Critical Value/emergent results were called by telephone at the time of interpretation on 09/23/2013 at 3:00 PM to Dr. Kathrynn Speed , who verbally acknowledged these results.   Electronically Signed   By: Jeannine Boga M.D.   On: 09/23/2013 15:03   Mr Brain Wo Contrast  09/23/2013   CLINICAL  DATA:  Slurred speech.  Hypertension.  Atrial fibrillation.  EXAM: MRI HEAD WITHOUT CONTRAST  TECHNIQUE: Multiplanar, multiecho pulse sequences of the brain and surrounding structures were obtained without intravenous contrast.  COMPARISON:  09/23/2013 CT.  FINDINGS: Suggestion tiny acute hemorrhagic infarct mid left peri operculum region.  Tiny area of blood breakdown products right parietal lobe and left frontal lobe probably represent result of remote hemorrhage ischemia.  Small vessel disease type changes.  No intracranial mass lesion noted on this unenhanced exam.  Global atrophy. Ventricular prominence felt to be related to atrophy rather than hydrocephalus.  Partial opacification ethmoid sinus air cells, left sphenoid sinus maxillary sinuses.  Major intracranial vascular structures are patent. 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  Cervical medullary junction, pituitary region, pineal region and orbital structures unremarkable.  IMPRESSION: Suggestion tiny acute hemorrhagic infarct mid left peri operculum region.  Tiny area of blood breakdown products right parietal lobe and left frontal lobe probably represent result of  remote hemorrhage ischemia.  Small vessel disease type changes.  Global atrophy.  Partial opacification ethmoid sinus air cells, left sphenoid sinus and maxillary sinuses.  These results were called by telephone at the time of interpretation on 09/23/2013 at 7:05 PM to Dr. Reather Converse, who verbally acknowledged these results.   Electronically Signed   By: Chauncey Cruel M.D.   On: 09/23/2013 19:09    EKG: Independently reviewed. Normal sinus rhythm with PACs. EKG discussed with cardiologist.  Assessment/Plan Principal Problem:   Acute thrombotic stroke Active Problems:   CAD (coronary artery disease)   Hypertension   S/P CABG x 4   HTN (hypertension)   Hyperlipidemia LDL goal < 70   CVA (cerebral infarction)   1. Acute CVA - discussed with on-call neurologist Dr. Nicole Kindred who has advised patient to be placed on small aspirin 81 mg daily. Patient will have further stroke workup including MRA brain 2-D echo carotid Doppler hemoglobin A1c lipid panel physical therapy and swallow evaluation. Further recommendations per neurologist. 2. History of atrial fibrillation - presently monitor showing sinus rhythm and EKG showing PACs. I have discussed the EKG with cardiologist on call Dr. Colon Flattery. 3. Hypertension - continue Diovan will hold HCTZ for gentle hydration. 4. Hyperlipidemia - continue statins. 5. CAD status post CABG - denies any chest pain. 6. Hyperglycemia - check hemoglobin A1c.                                                I have discussed with on-call cardiologist about the EKG.  I have reviewed patient's old charts and labs.   Code Status: Full code.  Family Communication: Wife at the bedside.  Disposition Plan: Admit to inpatient under Dr. Josetta Huddle.    Reshma Hoey N. [facility] Pager [phone].  If 7PM-7AM, please contact night-coverage [email or web address] Password [facility] 09/23/2013, 9:59 PM

## 2013-09-23 NOTE — ED Notes (Signed)
Patient transported to MRI 

## 2013-09-23 NOTE — Consult Note (Addendum)
Referring Physician: Lacinda Axon    Chief Complaint: dysarthria  HPI:                                                                                                                                         Shane Espinoza is an 73 y.o. 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  He also noted he was having some difficulty getting his thoughts out and some slow mentation. These symptoms waxed and waned but never fully resolved.  He went to a financial event when at 18 his wife noted the speech had become worse. He was brought to [facility] ED where Code stroke was called.  Currently he continues to show dysarthria but no other symptoms.   Date last known well: Date: 09/23/2013 Time last known well: Time: 11:00 tPA Given: No: minimal symptoms  Past Medical History  Diagnosis Date  . Hypertension   . Anxiety   . 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 Atrial fibrillation   . CAD (coronary artery disease)     [name]  . Gout     Past Surgical History  Procedure Laterality Date  . Coronary artery bypass graft  03/07/2011    DR.VAN  TRIGHT  . Eye surgery    . Colon surgery    . Vascular doppler  03/03/2011    Nosignificant extracranial carotid artery stenosis  . Cardiac catheterization  03/02/2011    Recommended CABG  . Lexiscan myoview  02/20/2012    EKG negative for ischemia, noraml study  . Transthoracic echocardiogram  02/20/2012    EF 50-55%, mild-moderate concentric LVH, LA moderately dilated, moderate mitral regurg    Family History  Problem Relation Age of Onset  . CVA Father   . Heart attack Maternal Grandmother   . Heart attack Maternal Grandfather   . Heart murmur Sister   . Hypertension Son    Social History:  reports that he has never smoked. He has never used smokeless tobacco. He reports that he does not drink alcohol or use illicit drugs.  Allergies: No Known Allergies  Medications:  No current facility-administered medications for this encounter.   Current Outpatient Prescriptions  Medication Sig Dispense Refill  . amLODipine (NORVASC) 5 MG tablet Take 1 tablet (5 mg total) by mouth daily.  90 tablet  3  . aspirin 81 MG EC tablet Take 81 mg by mouth daily. Swallow whole.      . beta carotene w/minerals (OCUVITE) tablet Take 1 tablet by mouth daily.        . cholecalciferol (VITAMIN D) 1000 UNITS tablet Take 5,000 Units by mouth 3 (three) times a week. Monday, Wednesday, friday      . fish oil-omega-3 fatty acids 1000 MG capsule Take 1,200 mg by mouth daily.        . metoprolol tartrate (LOPRESSOR) 25 MG tablet Take 0.5 tablets (12.5 mg total) by mouth 2 (two) times daily.  90 tablet  3  . simvastatin (ZOCOR) 20 MG tablet Take 1 tablet (20 mg total) by mouth at bedtime.  90 tablet  3  . valsartan-hydrochlorothiazide (DIOVAN-HCT) 320-25 MG per tablet Take 1 tablet by mouth daily.  90 tablet  3  . [DISCONTINUED] amiodarone (PACERONE) 200 MG tablet Take 200 mg by mouth daily.          ROS:                                                                                                                                       History obtained from the patient  General ROS: negative for - chills, fatigue, fever, night sweats, weight gain or weight loss Psychological ROS: negative for - behavioral disorder, hallucinations, memory difficulties, mood swings or suicidal ideation Ophthalmic ROS: negative for - blurry vision, double vision, eye pain or loss of vision ENT ROS: negative for - epistaxis, nasal discharge, oral lesions, sore throat, tinnitus or vertigo Allergy and Immunology ROS: negative for - hives or itchy/watery eyes Hematological and Lymphatic ROS: negative for - bleeding problems, bruising or swollen lymph nodes Endocrine ROS: negative for - galactorrhea, hair pattern changes,  polydipsia/polyuria or temperature intolerance Respiratory ROS: negative for - cough, hemoptysis, shortness of breath or wheezing Cardiovascular ROS: negative for - chest pain, dyspnea on exertion, edema or irregular heartbeat Gastrointestinal ROS: negative for - abdominal pain, diarrhea, hematemesis, nausea/vomiting or stool incontinence Genito-Urinary ROS: negative for - dysuria, hematuria, incontinence or urinary frequency/urgency Musculoskeletal ROS: negative for - joint swelling or muscular weakness Neurological ROS: as noted in HPI Dermatological ROS: negative for rash and skin lesion changes  Neurologic Examination:  Blood pressure 128/66, pulse 84, temperature 97.4 F (36.3 C), temperature source Oral, resp. rate 14, height 5\' 9"  (1.753 m), weight 93.759 kg (206 lb 11.2 oz), SpO2 98.00%.   Mental Status: Alert, oriented, thought content appropriate.  Speech dysarthric without evidence of aphasia.  Able to follow 3 step commands without difficulty. Cranial Nerves: II: Discs flat bilaterally; Visual fields grossly normal, pupils equal, round, reactive to light and accommodation III,IV, VI: ptosis not present, extra-ocular motions intact bilaterally V,VII: smile symmetric, facial light touch sensation normal bilaterally VIII: hearing normal bilaterally IX,X: gag reflex present XI: bilateral shoulder shrug XII: midline tongue extension without atrophy or fasciculations  Motor: Right : Upper extremity   5/5    Left:     Upper extremity   5/5  Lower extremity   5/5     Lower extremity   5/5 Tone and bulk:normal tone throughout; no atrophy noted Sensory: Pinprick and light touch intact throughout, bilaterally Deep Tendon Reflexes:  Right: Upper Extremity   Left: Upper extremity   biceps (C-5 to C-6) 2/4   biceps (C-5 to C-6) 2/4 tricep (C7) 2/4    triceps (C7) 2/4 Brachioradialis  (C6) 2/4  Brachioradialis (C6) 2/4  Lower Extremity Lower Extremity  quadriceps (L-2 to L-4) 2/4   quadriceps (L-2 to L-4) 2/4 Achilles (S1) 2/4   Achilles (S1) 2/4  Plantars: Right: downgoing   Left: downgoing Cerebellar: normal finger-to-nose,  normal heel-to-shin test Gait: not tested due to multiple leads.  CV: pulses palpable throughout    Lab Results: Basic Metabolic Panel:  Recent Labs Lab 09/23/13 1515  NA 144  K 3.0*  CL 103  GLUCOSE 174*  BUN 19  CREATININE 1.10    Liver Function Tests: No results found for this basename: AST, ALT, ALKPHOS, BILITOT, PROT, ALBUMIN,  in the last 168 hours No results found for this basename: LIPASE, AMYLASE,  in the last 168 hours No results found for this basename: AMMONIA,  in the last 168 hours  CBC:  Recent Labs Lab 09/23/13 1450 09/23/13 1515  WBC 9.2  --   NEUTROABS PENDING  --   HGB 15.7 16.3  HCT 46.4 48.0  MCV 89.7  --   PLT 226  --     Cardiac Enzymes: No results found for this basename: CKTOTAL, CKMB, CKMBINDEX, TROPONINI,  in the last 168 hours  Lipid Panel: No results found for this basename: CHOL, TRIG, HDL, CHOLHDL, VLDL, LDLCALC,  in the last 168 hours  CBG:  Recent Labs Lab 09/23/13 [place] 156*    Microbiology: Results for orders placed during the hospital encounter of 03/07/11  SURGICAL PCR SCREEN     Status: None   Collection Time    03/03/11  3:07 PM      Result Value Ref Range Status   MRSA, PCR NEGATIVE  NEGATIVE Final   Staphylococcus aureus NEGATIVE  NEGATIVE Final   Comment:            The Xpert SA Assay (FDA     approved for NASAL specimens     only), is one component of     a comprehensive surveillance     program.  It is not intended     to diagnose infection nor to     guide or monitor treatment.  URINE CULTURE     Status: None   Collection Time    03/09/11  8:49 AM      Result Value Ref Range Status  Specimen Description URINE, RANDOM   Final   Special  Requests PATIENT ON FOLLOWING ZINACEF   Final   Culture  Setup Time OR:[id number]   Final   Colony Count NO GROWTH   Final   Culture NO GROWTH   Final   Report Status 03/10/2011 FINAL   Final    Coagulation Studies: No results found for this basename: LABPROT, INR,  in the last 72 hours  Imaging: Ct Head (brain) Wo Contrast  09/23/2013   CLINICAL DATA:  Slurred speech  EXAM: CT HEAD WITHOUT CONTRAST  TECHNIQUE: Contiguous axial images were obtained from the base of the skull through the vertex without intravenous contrast.  COMPARISON:  Prior CT from 11/13/2011  FINDINGS: There is no acute intracranial hemorrhage or infarct. No mass lesion or midline shift. Gray-white matter differentiation is well maintained. Ventricles are normal in size without evidence of hydrocephalus. CSF containing spaces are within normal limits. No extra-axial fluid collection. Atrophy with chronic microvascular ischemic disease again noted, slightly progressed.  The calvarium is intact.  Orbital soft tissues are within normal limits.  The paranasal sinuses and mastoid air cells are well pneumatized and free of fluid.  Scalp soft tissues are unremarkable.  IMPRESSION: 1. No acute intracranial process. 2. Atrophy with chronic microvascular ischemic disease, slightly progressed relative to prior CT from 11/13/2011 Critical Value/emergent results were called by telephone at the time of interpretation on 09/23/2013 at 3:00 PM to Dr. Kathrynn Speed , who verbally acknowledged these results.   Electronically Signed   By: Jeannine Boga M.D.   On: 09/23/2013 15:03       Assessment and plan discussed with with attending physician and they are in agreement.    Etta Quill PA-C [facility] [phone]  09/23/2013, 3:28 PM   Assessment: 73 y.o. male with sudden onset of dysarthria, slow mentation and word finding difficulties. Currently he only has dysarthria.  CT head negative for acute infarct. Due to  risk factors cannot fully exclude CVA.  tPA was not administered due to minimal symptoms.   Isolated mild dysarthria has many possible causes, including migraine, neuromuscular disease oral infections, mild delirium.    Stroke Risk Factors - atrial fibrillation, hypertension and CAD  Recommend: 1) MRI brain to evaluate for CVA 2) further recs pending MRI  Roland Rack, MD [facility] [phone]  If 7pm- 7am, please page neurology on call as listed in [place].  MRI shows small stroke.   1. HgbA1c, fasting lipid panel 2. MRA  of the brain without contrast 3. Frequent neuro checks 4. Echocardiogram 5. Carotid dopplers 6. Prophylactic therapy-Antiplatelet med: Aspirin - dose 325mg  PO or 300mg  PR 7. Risk factor modification 8. Telemetry monitoring 9. PT consult, OT consult, Speech consult  Roland Rack, MD [facility] [phone]  If 7pm- 7am, please page neurology on call as listed in [place].

## 2013-09-24 ENCOUNTER — Inpatient Hospital Stay (HOSPITAL_COMMUNITY): Payer: Medicare Other

## 2013-09-24 DIAGNOSIS — I517 Cardiomegaly: Secondary | ICD-10-CM

## 2013-09-24 LAB — LIPID PANEL
CHOL/HDL RATIO: 2.9 ratio
CHOLESTEROL: 112 mg/dL (ref 0–200)
HDL: 38 mg/dL — ABNORMAL LOW (ref >39)
LDL Cholesterol: 52 mg/dL (ref 0–99)
Triglycerides: 112 mg/dL (ref <150)
VLDL: 22 mg/dL (ref 0–40)

## 2013-09-24 LAB — CBC WITH DIFFERENTIAL/PLATELET
BASOS ABS: 0 10*3/uL (ref 0.0–0.1)
BASOS PCT: 0 % (ref 0–1)
Eosinophils Absolute: 0.5 10*3/uL (ref 0.0–0.7)
Eosinophils Relative: 5 % (ref 0–5)
HEMATOCRIT: 43.2 % (ref 39.0–52.0)
Hemoglobin: 14.7 g/dL (ref 13.0–17.0)
LYMPHS PCT: 40 % (ref 12–46)
Lymphs Abs: 3.9 10*3/uL (ref 0.7–4.0)
MCH: 30.6 pg (ref 26.0–34.0)
MCHC: 34 g/dL (ref 30.0–36.0)
MCV: 89.8 fL (ref 78.0–100.0)
Monocytes Absolute: 1.2 10*3/uL — ABNORMAL HIGH (ref 0.1–1.0)
Monocytes Relative: 12 % (ref 3–12)
NEUTROS ABS: 4.1 10*3/uL (ref 1.7–7.7)
Neutrophils Relative %: 42 % — ABNORMAL LOW (ref 43–77)
PLATELETS: 203 10*3/uL (ref 150–400)
RBC: 4.81 MIL/uL (ref 4.22–5.81)
RDW: 13.5 % (ref 11.5–15.5)
WBC: 9.8 10*3/uL (ref 4.0–10.5)

## 2013-09-24 LAB — COMPREHENSIVE METABOLIC PANEL
ALT: 22 U/L (ref 0–53)
AST: 25 U/L (ref 0–37)
Albumin: 3.3 g/dL — ABNORMAL LOW (ref 3.5–5.2)
Alkaline Phosphatase: 54 U/L (ref 39–117)
BUN: 15 mg/dL (ref 6–23)
CO2: 26 meq/L (ref 19–32)
Calcium: 9.2 mg/dL (ref 8.4–10.5)
Chloride: 103 mEq/L (ref 96–112)
Creatinine, Ser: 0.8 mg/dL (ref 0.50–1.35)
GFR calc non Af Amer: 87 mL/min — ABNORMAL LOW (ref >90)
Glucose, Bld: 101 mg/dL — ABNORMAL HIGH (ref 70–99)
POTASSIUM: 3.9 meq/L (ref 3.7–5.3)
Sodium: 144 mEq/L (ref 137–147)
TOTAL PROTEIN: 6.7 g/dL (ref 6.0–8.3)
Total Bilirubin: 0.7 mg/dL (ref 0.3–1.2)

## 2013-09-24 LAB — HEMOGLOBIN A1C
HEMOGLOBIN A1C: 6.2 % — AB (ref <5.7)
Mean Plasma Glucose: 131 mg/dL — ABNORMAL HIGH (ref <117)

## 2013-09-24 LAB — TSH: TSH: 2.456 u[IU]/mL (ref 0.350–4.500)

## 2013-09-24 MED ORDER — ENOXAPARIN SODIUM 40 MG/0.4ML ~~LOC~~ SOLN
40.0000 mg | Freq: Every day | SUBCUTANEOUS | Status: DC
  Administered 2013-09-24: 40 mg via SUBCUTANEOUS
  Filled 2013-09-24 (×2): qty 0.4

## 2013-09-24 MED ORDER — IRBESARTAN 300 MG PO TABS
300.0000 mg | ORAL_TABLET | Freq: Every day | ORAL | Status: DC
  Administered 2013-09-24 – 2013-09-25 (×2): 300 mg via ORAL
  Filled 2013-09-24 (×2): qty 1

## 2013-09-24 NOTE — Progress Notes (Signed)
Bilateral carotid artery duplex:  1-39% ICA stenosis.  Vertebral artery flow is antegrade.     

## 2013-09-24 NOTE — Evaluation (Signed)
Physical Therapy Evaluation Patient Details Name: SAMIEL PEEL MRN: [id number] DOB: 10/03/1940 Today's Date: 09/24/2013   History of Present Illness  pt presents with speech deficits and cognitive deficits.    Clinical Impression  Pt moving well and is independent with all mobility.  Pt ed on s/s CVA.  No further PT needs at this time.  Will sign off.      Follow Up Recommendations No PT follow up    Equipment Recommendations  None recommended by PT    Recommendations for Other Services       Precautions / Restrictions Precautions Precautions: None Restrictions Weight Bearing Restrictions: No      Mobility  Bed Mobility Overal bed mobility: Independent                Transfers Overall transfer level: Independent                  Ambulation/Gait Ambulation/Gait assistance: Independent Ambulation Distance (Feet): 500 Feet Assistive device: None Gait Pattern/deviations: WFL(Within Functional Limits)   Gait velocity interpretation: at or above normal speed for age/gender    Stairs Stairs: Yes Stairs assistance: Modified independent (Device/Increase time) Stair Management: One rail Right;Alternating pattern;Forwards Number of Stairs: 11    Wheelchair Mobility    Modified Rankin (Stroke Patients Only) Modified Rankin (Stroke Patients Only) Pre-Morbid Rankin Score: No symptoms Modified Rankin: No significant disability     Balance Overall balance assessment: Independent                                   Pertinent Vitals/Pain Denied pain.      Home Living Family/patient expects to be discharged to:: Private residence Living Arrangements: Spouse/significant other Available Help at Discharge: Family;Available 24 hours/day Type of Home: House Home Access: Stairs to enter   [employer] of Steps: 4 Home Layout: One level Home Equipment: None      Prior Function Level of Independence: Independent                Hand Dominance   Dominant Hand: Right    Extremity/Trunk Assessment   Upper Extremity Assessment: Overall WFL for tasks assessed           Lower Extremity Assessment: Overall WFL for tasks assessed      Cervical / Trunk Assessment: Normal  Communication   Communication: Expressive difficulties  Cognition Arousal/Alertness: Awake/alert Behavior During Therapy: WFL for tasks assessed/performed Overall Cognitive Status: Within Functional Limits for tasks assessed                 General Comments: pt WFL for tasks assessed, but stated had been having difficulty with counting money PTA.      General Comments      Exercises        Assessment/Plan    PT Assessment Patent does not need any further PT services  PT Diagnosis     PT Problem List    PT Treatment Interventions     PT Goals (Current goals can be found in the Care Plan section) Acute Rehab PT Goals PT Goal Formulation: No goals set, d/c therapy    Frequency     Barriers to discharge        End of Session   Activity Tolerance: Patient tolerated treatment well Patient left: in bed;with call bell/phone within reach;with family/visitor present         Time: 5732-2025 PT Time  Calculation (min): 36 min   Charges:   PT Evaluation $Initial PT Evaluation Tier I: 1 Procedure $PT Exercise Group 3 units: 1 Procedure PT Treatments $Gait Training: 8-22 mins   PT G CodesCatarina Hartshorn, [place] 09/24/2013, 9:56 AM

## 2013-09-24 NOTE — Evaluation (Signed)
Speech Language Pathology Evaluation Patient Details Name: Shane Espinoza MRN: [id number] DOB: [birth year] Today's Date: 09/24/2013 Time: 7989-2119 SLP Time Calculation (min): 25 min  Problem List:  Patient Active Problem List   Diagnosis Date Noted  . Acute thrombotic stroke 09/23/2013  . CVA (cerebral infarction) 09/23/2013  . HTN (hypertension) 04/16/2013  . Hyperlipidemia LDL goal < 70 04/16/2013  . CAD (coronary artery disease)   . Anxiety   . Hypertension   . S/P CABG x 4    Past Medical History:  Past Medical History  Diagnosis Date  . Hypertension   . Anxiety   . 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 Atrial fibrillation   . CAD (coronary artery disease)     [name]  . Gout    Past Surgical History:  Past Surgical History  Procedure Laterality Date  . Coronary artery bypass graft  03/07/2011    DR.VAN  TRIGHT  . Eye surgery    . Colon surgery    . Vascular doppler  03/03/2011    Nosignificant extracranial carotid artery stenosis  . Cardiac catheterization  03/02/2011    Recommended CABG  . Lexiscan myoview  02/20/2012    EKG negative for ischemia, noraml study  . Transthoracic echocardiogram  02/20/2012    EF 50-55%, mild-moderate concentric LVH, LA moderately dilated, moderate mitral regurg   HPI:  73 yo male adm to [facility] with difficulty speaking and slow processing.  PMH + for CAD s/p CABG.  Speech is better and ability to find words is much better today per MD. Moving all extremities well. No headache or shortness of breath or chest pain. Per MD, pt has a small left tiny operculum hemorrhagic infarct but also could be thrombotic. Pt has a remote history of atrial fibrillation.  Speech evaluation ordered.     Assessment / Plan / Recommendation Clinical Impression  Pt presents with mild dysarthria with imprecise articulation at multisyllabic level resulting in mildly reduced speech intelligibillity.  Pt able to state medical event, home address, length  of time married, etc.  He was able to describe meeting his wife with fluent and intelligible speech/language.  As pt is highly educated and performs financial duties at home, recommend follow up with SLP in this acute setting to assure high level cognition adequate.  Advised spouse if deficits apparent after dc'd, outpt SLP may be beneficial.  Abbreviated evaluation due to pt being taken for MRI, however SLP spoke with spouse afterward and shared activities/compensation strategies verbally and in writing with her to maximize pt's speech/articulation, word finding and attention skills.  Left frontal lobe blood breakdown products may impact higher level cognition/executive functioning.  Spouse and pt verbalized understanding to information provided.      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  Overall Cognitive Status: Within Advertising copywriter for tasks assessed Arousal/Alertness: Awake/alert Orientation Level: Oriented X4 Attention: Alternating Selective Attention: Appears intact Alternating Attention: Appears intact Memory: Appears intact (recalled events of yesterday) Awareness: Appears intact (verbalized awareness to speech deficits yesterday  and difficulty counting money) Problem Solving: Appears intact    Comprehension  Auditory Comprehension Overall Auditory Comprehension: Appears within functional limits for tasks assessed Yes/No Questions: Not tested Commands: Within Functional Limits (for simple directions, did not test higher level due to testing  abbreviated, pt taken for MRI) Conversation: Complex Visual Recognition/Discrimination Discrimination: Within Function Limits Reading Comprehension Reading Status: Not tested    Expression Expression Primary Mode of Expression: Verbal Verbal Expression Overall Verbal Expression: Appears within functional limits for tasks assessed Initiation: No impairment Pragmatics: No impairment Written Expression Dominant Hand: Right Written Expression:  (writing legible from review of notes yesterday)   Oral / Motor Oral Motor/Sensory Function Overall Oral Motor/Sensory Function: Appears within functional limits for tasks assessed Motor Speech Overall Motor Speech: Impaired Respiration: Within functional limits Phonation: Normal Articulation: Impaired Level of Impairment: Word (impaired at multisyllabic level) Intelligibility: Intelligibility reduced Word: 75-100% accurate Phrase: 75-100% accurate Sentence: 75-100% accurate Conversation: 75-100% accurate Motor Planning: Witnin functional limits Effective Techniques: Slow rate;Over-articulate   GO     Claudie Fisherman, [place] [facility] SLP [phone]

## 2013-09-24 NOTE — Progress Notes (Signed)
Stroke Team Progress Note  HISTORY Shane Espinoza is an 73 y.o. 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 He also noted he was having some difficulty getting his thoughts out and some slow mentation. These symptoms waxed and waned but never fully resolved. He went to a financial event when at 51 his wife noted the speech had become worse. He was brought to [facility] ED where Code stroke was called. Currently he continues to show dysarthria but no other symptoms.  Patient was not administerd TPA secondary to minimal symptoms. He was admitted for further evaluation and treatment.  SUBJECTIVE His wife is at the bedside.  Overall he feels his condition is stable. They report a history of post op afib - ? Length of afib and if cardiology felt anticoagulation needed. Pt came off coumadin following a fall with injury.  OBJECTIVE Most recent Vital Signs: Filed Vitals:   09/24/13 0313 09/24/13 0505 09/24/13 0911 09/24/13 1118  BP: 130/71 134/70 136/83 146/80  Pulse: 68 62 71 68  Temp: 98.1 F (36.7 C) 97.9 F (36.6 C) 98 F (36.7 C) 97.8 F (36.6 C)  TempSrc: Oral Oral Oral Oral  Resp:  18 18 18   Height:      Weight:      SpO2: 99% 100% 99% 97%   CBG (last 3)   Recent Labs  09/23/13 1509  GLUCAP 156*    IV Fluid Intake:   . sodium chloride 20 mL/hr at 09/23/13 2334    MEDICATIONS  . amLODipine  5 mg Oral Daily  . aspirin EC  81 mg Oral Daily  . beta carotene w/minerals  1 tablet Oral Daily  . cholecalciferol  5,000 Units Oral Once per day on Mon Wed Fri  . enoxaparin (LOVENOX) injection  40 mg Subcutaneous QHS  . irbesartan  300 mg Oral Daily  . metoprolol tartrate  12.5 mg Oral BID  . omega-3 acid ethyl esters  1 g Oral Daily  . simvastatin  20 mg Oral QHS   PRN:  senna-docusate  Diet:  Cardiac thin liquids Activity:  OOB with assistance DVT Prophylaxis:  Lovenox 40 mg sq daily   CLINICALLY SIGNIFICANT  STUDIES Basic Metabolic Panel:   Recent Labs Lab 09/23/13 1450 09/23/13 1515 09/24/13 0403  NA 143 144 144  K 3.2* 3.0* 3.9  CL 101 103 103  CO2 24  --  26  GLUCOSE 165* 174* 101*  BUN 19 19 15   CREATININE 0.95 1.10 0.80  CALCIUM 9.9  --  9.2   Liver Function Tests:   Recent Labs Lab 09/23/13 1450 09/24/13 0403  AST 30 25  ALT 28 22  ALKPHOS 61 54  BILITOT 0.6 0.7  PROT 7.2 6.7  ALBUMIN 3.7 3.3*   CBC:   Recent Labs Lab 09/23/13 1450 09/23/13 1515 09/24/13 0403  WBC 9.2  --  9.8  NEUTROABS 4.3  --  4.1  HGB 15.7 16.3 14.7  HCT 46.4 48.0 43.2  MCV 89.7  --  89.8  PLT 226  --  203   Coagulation: No results found for this basename: LABPROT, INR,  in the last 168 hours Cardiac Enzymes: No results found for this basename: CKTOTAL, CKMB, CKMBINDEX, TROPONINI,  in the last 168 hours Urinalysis: No results found for this basename: COLORURINE, APPERANCEUR, LABSPEC, PHURINE, GLUCOSEU, HGBUR, BILIRUBINUR, KETONESUR, PROTEINUR, UROBILINOGEN, NITRITE, LEUKOCYTESUR,  in the last 168 hours Lipid Panel  Component Value Date/Time   CHOL 112 09/24/2013 0403   TRIG 112 09/24/2013 0403   HDL 38* 09/24/2013 0403   CHOLHDL 2.9 09/24/2013 0403   VLDL 22 09/24/2013 0403   LDLCALC 52 09/24/2013 0403   HgbA1C  Lab Results  Component Value Date   HGBA1C 6.3* 03/03/2011    Urine Drug Screen:   No results found for this basename: labopia,  cocainscrnur,  labbenz,  amphetmu,  thcu,  labbarb    Alcohol Level: No results found for this basename: ETH,  in the last 168 hours  CT of the brain  09/23/2013    1. No acute intracranial process. 2. Atrophy with chronic microvascular ischemic disease, slightly progressed relative to prior CT from 11/13/2011   MRI of the brain  09/23/2013   Suggestion tiny acute hemorrhagic infarct mid left peri operculum region.  Tiny area of blood breakdown products right parietal lobe and left frontal lobe probably represent result of remote hemorrhage  ischemia.  Small vessel disease type changes.  Global atrophy.  Partial opacification ethmoid sinus air cells, left sphenoid sinus and maxillary sinuses.    MRA of the brain  09/23/2013 Normal intracranial MR angiography of the large and medium size vessels.  2D Echocardiogram    Carotid Doppler    CXR    EKG  normal sinus rhythm, PAC's noted. For complete results please see formal report.   Therapy Recommendations no PT  Physical Exam   Pleasant elderly caucasian male not in distress.Awake alert. Afebrile. Head is nontraumatic. Neck is supple without bruit. Hearing is normal. Cardiac exam no murmur or gallop. Lungs are clear to auscultation. Distal pulses are well felt. Neurological Exam ;  Awake  Alert oriented x 3. Normal speech and language.eye movements full without nystagmus.fundi were not visualized. Vision acuity and fields appear normal. Hearing is normal. Palatal movements are normal. Face symmetric. Tongue midline. Normal strength, tone, reflexes and coordination. Normal sensation. Gait deferred. ASSESSMENT [name] is a 73 y.o. male presenting with dysarthria. Imaging confirms a left mid peri operculum infarct with tiny hemorrhagic transformation. Infarct felt to be embolic secondary to unknown source. Question hx of atrial fibrillation.  On aspirin 81 mg orally every day prior to admission. Now on aspirin 81 mg orally every day for secondary stroke prevention. Stroke work up underway.  hypertension Hyperlipidemia, LDL 52, on fish oil and zocor 20 mg daily PTA, now on zocor 20 mg daily, goal LDL < 100 CAD - Hx post CABG atrial fibrillation per Dr. Claiborne Billings note Oct 2014, has been on coumadin in the past per pt  Obesity, Body mass index is 30.2 kg/(m^2).  Family hx stroke (father)  Hospital day # 1  TREATMENT/PLAN  Continue aspirin 81 mg orally every day for secondary stroke prevention.  Need to find out from Dr. Claiborne Billings if history of atrial fibrillation just  related to post CABG or is an independent standing diagnosis. If it is a stand alone diagnoses, will need anticoagulation at time of discharge, we prefer NOACs if so  Complete stroke workup - 2D, carotid doppler   Burnetta Sabin, MSN, RN, ANVP-BC, AGPCNP-BC [facility] Pager: [phone] 09/24/2013 11:30 AM  I have personally obtained a history, examined the patient, evaluated imaging results, and formulated the assessment and plan of care. I agree with the above. Antony Contras, MD   To contact Stroke Continuity provider, please refer to [email or web address] After hours, contact General Neurology

## 2013-09-24 NOTE — Progress Notes (Signed)
Subjective: Patient feels well this AM. Speech is better and ability to find words is much better. Moving all extremities well. No headache or shortness of breath or chest pain. He has a small left tiny operculum hemorrhagic infarct but also could be thrombotic. Currently in sinus rhythm with remote history of atrial fibrillation. On baby aspirin usually, no Coumadin  Objective: Weight change:   Intake/Output Summary (Last 24 hours) at 09/24/13 0756 Last data filed at 09/24/13 0300  Gross per 24 hour  Intake      0 ml  Output    300 ml  Net   -300 ml   Filed Vitals:   09/23/13 2306 09/24/13 0115 09/24/13 0313 09/24/13 0505  BP: 142/63 123/65 130/71 134/70  Pulse: 66 57 68 62  Temp: 98.2 F (36.8 C) 97.9 F (36.6 C) 98.1 F (36.7 C) 97.9 F (36.6 C)  TempSrc: Oral Oral Oral Oral  Resp: 20 18  18   Height:      Weight:      SpO2: 97% 97% 99% 100%    General Appearance: Alert, cooperative, no distress, appears stated age Lungs: Clear to auscultation bilaterally, respirations unlabored Heart: Regular rate and rhythm, S1 and S2 normal, no murmur, rub or gallop Abdomen: Soft, non-tender, bowel sounds active all four quadrants, no masses, no organomegaly Extremities: Extremities normal, atraumatic, no cyanosis or edema Neuro: Alert and oriented x3, very mild dysarthria, otherwise benign exam. Did not ambulate patient  Lab Results: Results for orders placed during the hospital encounter of 09/23/13 (from the past 48 hour(s))  CBC     Status: None   Collection Time    09/23/13  2:50 PM      Result Value Ref Range   WBC 9.2  4.0 - 10.5 K/uL   RBC 5.17  4.22 - 5.81 MIL/uL   Hemoglobin 15.7  13.0 - 17.0 g/dL   HCT 46.4  39.0 - 52.0 %   MCV 89.7  78.0 - 100.0 fL   MCH 30.4  26.0 - 34.0 pg   MCHC 33.8  30.0 - 36.0 g/dL   RDW 13.6  11.5 - 15.5 %   Platelets 226  150 - 400 K/uL  DIFFERENTIAL     Status: None   Collection Time    09/23/13  2:50 PM      Result Value Ref Range    Neutrophils Relative % 47  43 - 77 %   Lymphocytes Relative 39  12 - 46 %   Monocytes Relative 10  3 - 12 %   Eosinophils Relative 4  0 - 5 %   Basophils Relative 0  0 - 1 %   Neutro Abs 4.3  1.7 - 7.7 K/uL   Lymphs Abs 3.6  0.7 - 4.0 K/uL   Monocytes Absolute 0.9  0.1 - 1.0 K/uL   Eosinophils Absolute 0.4  0.0 - 0.7 K/uL   Basophils Absolute 0.0  0.0 - 0.1 K/uL   WBC Morphology ATYPICAL LYMPHOCYTES    COMPREHENSIVE METABOLIC PANEL     Status: Abnormal   Collection Time    09/23/13  2:50 PM      Result Value Ref Range   Sodium 143  137 - 147 mEq/L   Potassium 3.2 (*) 3.7 - 5.3 mEq/L   Chloride 101  96 - 112 mEq/L   CO2 24  19 - 32 mEq/L   Glucose, Bld 165 (*) 70 - 99 mg/dL   BUN 19  6 - 23 mg/dL  Creatinine, Ser 0.95  0.50 - 1.35 mg/dL   Calcium 9.9  8.4 - 10.5 mg/dL   Total Protein 7.2  6.0 - 8.3 g/dL   Albumin 3.7  3.5 - 5.2 g/dL   AST 30  0 - 37 U/L   ALT 28  0 - 53 U/L   Alkaline Phosphatase 61  39 - 117 U/L   Total Bilirubin 0.6  0.3 - 1.2 mg/dL   GFR calc non Af Amer 81 (*) >90 mL/min   GFR calc Af Amer >90  >90 mL/min   Comment: (NOTE)     The eGFR has been calculated using the CKD EPI equation.     This calculation has not been validated in all clinical situations.     eGFR's persistently <90 mL/min signify possible Chronic Kidney     Disease.  Randolm Idol, ED     Status: None   Collection Time    09/23/13  3:04 PM      Result Value Ref Range   Troponin i, poc 0.01  0.00 - 0.08 ng/mL   Comment 3            Comment: Due to the release kinetics of cTnI,     a negative result within the first hours     of the onset of symptoms does not rule out     myocardial infarction with certainty.     If myocardial infarction is still suspected,     repeat the test at appropriate intervals.  CBG MONITORING, ED     Status: Abnormal   Collection Time    09/23/13  3:09 PM      Result Value Ref Range   Glucose-Capillary 156 (*) 70 - 99 mg/dL  I-STAT CHEM 8, ED      Status: Abnormal   Collection Time    09/23/13  3:15 PM      Result Value Ref Range   Sodium 144  137 - 147 mEq/L   Potassium 3.0 (*) 3.7 - 5.3 mEq/L   Chloride 103  96 - 112 mEq/L   BUN 19  6 - 23 mg/dL   Creatinine, Ser 1.10  0.50 - 1.35 mg/dL   Glucose, Bld 174 (*) 70 - 99 mg/dL   Calcium, Ion 1.22  1.13 - 1.30 mmol/L   TCO2 25  0 - 100 mmol/L   Hemoglobin 16.3  13.0 - 17.0 g/dL   HCT 48.0  39.0 - 52.0 %  COMPREHENSIVE METABOLIC PANEL     Status: Abnormal   Collection Time    09/24/13  4:03 AM      Result Value Ref Range   Sodium 144  137 - 147 mEq/L   Potassium 3.9  3.7 - 5.3 mEq/L   Comment: DELTA CHECK NOTED   Chloride 103  96 - 112 mEq/L   CO2 26  19 - 32 mEq/L   Glucose, Bld 101 (*) 70 - 99 mg/dL   BUN 15  6 - 23 mg/dL   Creatinine, Ser 0.80  0.50 - 1.35 mg/dL   Calcium 9.2  8.4 - 10.5 mg/dL   Total Protein 6.7  6.0 - 8.3 g/dL   Albumin 3.3 (*) 3.5 - 5.2 g/dL   AST 25  0 - 37 U/L   ALT 22  0 - 53 U/L   Alkaline Phosphatase 54  39 - 117 U/L   Total Bilirubin 0.7  0.3 - 1.2 mg/dL   GFR calc non Af Amer 87 (*) >  90 mL/min   GFR calc Af Amer >90  >90 mL/min   Comment: (NOTE)     The eGFR has been calculated using the CKD EPI equation.     This calculation has not been validated in all clinical situations.     eGFR's persistently <90 mL/min signify possible Chronic Kidney     Disease.  CBC WITH DIFFERENTIAL     Status: Abnormal   Collection Time    09/24/13  4:03 AM      Result Value Ref Range   WBC 9.8  4.0 - 10.5 K/uL   RBC 4.81  4.22 - 5.81 MIL/uL   Hemoglobin 14.7  13.0 - 17.0 g/dL   HCT 43.2  39.0 - 52.0 %   MCV 89.8  78.0 - 100.0 fL   MCH 30.6  26.0 - 34.0 pg   MCHC 34.0  30.0 - 36.0 g/dL   RDW 13.5  11.5 - 15.5 %   Platelets 203  150 - 400 K/uL   Neutrophils Relative % 42 (*) 43 - 77 %   Neutro Abs 4.1  1.7 - 7.7 K/uL   Lymphocytes Relative 40  12 - 46 %   Lymphs Abs 3.9  0.7 - 4.0 K/uL   Monocytes Relative 12  3 - 12 %   Monocytes Absolute 1.2 (*)  0.1 - 1.0 K/uL   Eosinophils Relative 5  0 - 5 %   Eosinophils Absolute 0.5  0.0 - 0.7 K/uL   Basophils Relative 0  0 - 1 %   Basophils Absolute 0.0  0.0 - 0.1 K/uL  LIPID PANEL     Status: Abnormal   Collection Time    09/24/13  4:03 AM      Result Value Ref Range   Cholesterol 112  0 - 200 mg/dL   Triglycerides 112  <150 mg/dL   HDL 38 (*) >39 mg/dL   Total CHOL/HDL Ratio 2.9     VLDL 22  0 - 40 mg/dL   LDL Cholesterol 52  0 - 99 mg/dL   Comment:            Total Cholesterol/HDL:CHD Risk     Coronary Heart Disease Risk Table                         Men   Women      1/2 Average Risk   3.4   3.3      Average Risk       5.0   4.4      2 X Average Risk   9.6   7.1      3 X Average Risk  23.4   11.0                Use the calculated Patient Ratio     above and the CHD Risk Table     to determine the patient's CHD Risk.                ATP III CLASSIFICATION (LDL):      <100     mg/dL   Optimal      100-129  mg/dL   Near or Above                        Optimal      130-159  mg/dL   Borderline      160-189  mg/dL   High      >  190     mg/dL   Very High    Studies/Results: Ct Head (brain) Wo Contrast  09/23/2013   CLINICAL DATA:  Slurred speech  EXAM: CT HEAD WITHOUT CONTRAST  TECHNIQUE: Contiguous axial images were obtained from the base of the skull through the vertex without intravenous contrast.  COMPARISON:  Prior CT from 11/13/2011  FINDINGS: There is no acute intracranial hemorrhage or infarct. No mass lesion or midline shift. Gray-white matter differentiation is well maintained. Ventricles are normal in size without evidence of hydrocephalus. CSF containing spaces are within normal limits. No extra-axial fluid collection. Atrophy with chronic microvascular ischemic disease again noted, slightly progressed.  The calvarium is intact.  Orbital soft tissues are within normal limits.  The paranasal sinuses and mastoid air cells are well pneumatized and free of fluid.  Scalp soft  tissues are unremarkable.  IMPRESSION: 1. No acute intracranial process. 2. Atrophy with chronic microvascular ischemic disease, slightly progressed relative to prior CT from 11/13/2011 Critical Value/emergent results were called by telephone at the time of interpretation on 09/23/2013 at 3:00 PM to Dr. Kathrynn Speed , who verbally acknowledged these results.   Electronically Signed   By: Jeannine Boga M.D.   On: 09/23/2013 15:03   Mr Brain Wo Contrast  09/23/2013   CLINICAL DATA:  Slurred speech.  Hypertension.  Atrial fibrillation.  EXAM: MRI HEAD WITHOUT CONTRAST  TECHNIQUE: Multiplanar, multiecho pulse sequences of the brain and surrounding structures were obtained without intravenous contrast.  COMPARISON:  09/23/2013 CT.  FINDINGS: Suggestion tiny acute hemorrhagic infarct mid left peri operculum region.  Tiny area of blood breakdown products right parietal lobe and left frontal lobe probably represent result of remote hemorrhage ischemia.  Small vessel disease type changes.  No intracranial mass lesion noted on this unenhanced exam.  Global atrophy. Ventricular prominence felt to be related to atrophy rather than hydrocephalus.  Partial opacification ethmoid sinus air cells, left sphenoid sinus maxillary sinuses.  Major intracranial vascular structures are patent. 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  Cervical medullary junction, pituitary region, pineal region and orbital structures unremarkable.  IMPRESSION: Suggestion tiny acute hemorrhagic infarct mid left peri operculum region.  Tiny area of blood breakdown products right parietal lobe and left frontal lobe probably represent result of remote hemorrhage ischemia.  Small vessel disease type changes.  Global atrophy.  Partial opacification ethmoid sinus air cells, left sphenoid sinus and maxillary sinuses.  These results were called by telephone at the time of interpretation on 09/23/2013 at 7:05 PM to Dr.  Reather Converse, who verbally acknowledged these results.   Electronically Signed   By: Chauncey Cruel M.D.   On: 09/23/2013 19:09   Medications: Scheduled Meds: . amLODipine  5 mg Oral Daily  . aspirin EC  81 mg Oral Daily  . beta carotene w/minerals  1 tablet Oral Daily  . cholecalciferol  5,000 Units Oral Once per day on Mon Wed Fri  . enoxaparin (LOVENOX) injection  30 mg Subcutaneous Q24H  . irbesartan  300 mg Oral Daily  . metoprolol tartrate  12.5 mg Oral BID  . omega-3 acid ethyl esters  1 g Oral Daily  . simvastatin  20 mg Oral QHS   Continuous Infusions: . sodium chloride 20 mL/hr at 09/23/13 2334   PRN Meds:.senna-docusate  Assessment/Plan: Principal Problem:   Acute thrombotic stroke Active Problems:   CAD (coronary artery disease)   Hypertension   S/P CABG x 4   HTN (hypertension)   Hyperlipidemia  LDL goal < 70   CVA (cerebral infarction)  1. Acute CVA - Dr. Nicole Kindred of neurology advised patient to be placed on small aspirin 81 mg daily. Patient will have further stroke workup including MRA brain 2-D echo carotid Doppler hemoglobin A1c lipid panel physical therapy and swallow evaluation. Further recommendations per neurologist. 2. History of atrial fibrillation - presently monitor showing sinus rhythm and EKG showing PACs. I have discussed the EKG with cardiologist on call Dr. Colon Flattery. 3. Hypertension - continue Diovan and hold HCTZ for gentle hydration. 4. Hyperlipidemia - continue statins. 5. CAD status post CABG - denies any chest pain. 6. Hyperglycemia - check hemoglobin A1c.    LOS: 1 day   Henrine Screws, MD 09/24/2013, 7:56 AM

## 2013-09-24 NOTE — Progress Notes (Signed)
   CARE MANAGEMENT NOTE 09/24/2013  Patient:  Shane Espinoza, Shane Espinoza   Account Number:  [id number]  Date Initiated:  09/24/2013  Documentation initiated by:  Olga Coaster  Subjective/Objective Assessment:   ADMITTED FOR STROKE WORKUP     Action/Plan:   CM FOLLOWING FOR DCP   Anticipated DC Date:  09/30/2013   Anticipated DC Plan:  HOME      DC Planning Services  CM consult         Status of service:  In process, will continue to follow Medicare Important Message given?  NA - LOS <3 / Initial given by admissions (If response is "NO", the following Medicare IM given date fields will be blank)  Per UR Regulation:  Reviewed for med. necessity/level of care/duration of stay  Comments:  3/25/2015Mindi Slicker RN,BSN,MHA [phone]

## 2013-09-24 NOTE — Progress Notes (Signed)
Echocardiogram 2D Echocardiogram has been performed.  Shane Espinoza 09/24/2013, 4:26 PM

## 2013-09-25 MED ORDER — ASPIRIN 81 MG PO TBEC: 325.0000 mg | DELAYED_RELEASE_TABLET | Freq: Every day | ORAL | Status: DC

## 2013-09-25 NOTE — Progress Notes (Signed)
Stroke Team Progress Note  HISTORY Shane Espinoza is a 73 y.o. male who was doing his taxes on the morning of 09/23/2013 and noted around 11 AM he was slurring his speech. He also noted he was having some difficulty getting his thoughts out and some slow mentation. These symptoms waxed and waned but never fully resolved. He went to a financial event when at 59 his wife noted the speech had become worse. He was brought to [facility] ED where Code stroke was called. He was seen by Dr. Leonel Ramsay and noted to be dysarthric but had no other deficits.  Patient was not administerd TPA secondary to minimal symptoms. He was admitted for further evaluation and treatment.  SUBJECTIVE Wife present. Pt ready for discharge.  OBJECTIVE Most recent Vital Signs: Filed Vitals:   09/24/13 1815 09/24/13 2022 09/25/13 0403 09/25/13 0639  BP: 122/65 138/68 128/62 137/82  Pulse: 61 68 65 74  Temp: 97.9 F (36.6 C) 97.9 F (36.6 C) 97.3 F (36.3 C) 97.8 F (36.6 C)  TempSrc: Oral Oral Oral Oral  Resp: 18 20 20 17   Height:      Weight:      SpO2: 100% 100% 97% 98%   CBG (last 3)   Recent Labs  09/23/13 1509  GLUCAP 156*    IV Fluid Intake:   . sodium chloride 20 mL/hr at 09/23/13 2334    MEDICATIONS  . amLODipine  5 mg Oral Daily  . aspirin EC  81 mg Oral Daily  . beta carotene w/minerals  1 tablet Oral Daily  . cholecalciferol  5,000 Units Oral Once per day on Mon Wed Fri  . enoxaparin (LOVENOX) injection  40 mg Subcutaneous QHS  . irbesartan  300 mg Oral Daily  . metoprolol tartrate  12.5 mg Oral BID  . omega-3 acid ethyl esters  1 g Oral Daily  . simvastatin  20 mg Oral QHS   PRN:  senna-docusate  Diet:  Cardiac thin liquids Activity:  OOB with assistance DVT Prophylaxis:  Lovenox 40 mg sq daily   CLINICALLY SIGNIFICANT STUDIES Basic Metabolic Panel:   Recent Labs Lab 09/23/13 1450 09/23/13 1515 09/24/13 0403  NA 143 144 144  K 3.2* 3.0* 3.9  CL 101 103 103  CO2 24  --  26   GLUCOSE 165* 174* 101*  BUN 19 19 15   CREATININE 0.95 1.10 0.80  CALCIUM 9.9  --  9.2   Liver Function Tests:   Recent Labs Lab 09/23/13 1450 09/24/13 0403  AST 30 25  ALT 28 22  ALKPHOS 61 54  BILITOT 0.6 0.7  PROT 7.2 6.7  ALBUMIN 3.7 3.3*   CBC:   Recent Labs Lab 09/23/13 1450 09/23/13 1515 09/24/13 0403  WBC 9.2  --  9.8  NEUTROABS 4.3  --  4.1  HGB 15.7 16.3 14.7  HCT 46.4 48.0 43.2  MCV 89.7  --  89.8  PLT 226  --  203   Coagulation: No results found for this basename: LABPROT, INR,  in the last 168 hours Cardiac Enzymes: No results found for this basename: CKTOTAL, CKMB, CKMBINDEX, TROPONINI,  in the last 168 hours Urinalysis: No results found for this basename: COLORURINE, APPERANCEUR, LABSPEC, PHURINE, GLUCOSEU, HGBUR, BILIRUBINUR, KETONESUR, PROTEINUR, UROBILINOGEN, NITRITE, LEUKOCYTESUR,  in the last 168 hours Lipid Panel    Component Value Date/Time   CHOL 112 09/24/2013 0403   TRIG 112 09/24/2013 0403   HDL 38* 09/24/2013 0403   CHOLHDL 2.9 09/24/2013 0403  VLDL 22 09/24/2013 0403   LDLCALC 52 09/24/2013 0403   HgbA1C  Lab Results  Component Value Date   HGBA1C 6.2* 09/24/2013    Urine Drug Screen:   No results found for this basename: labopia,  cocainscrnur,  labbenz,  amphetmu,  thcu,  labbarb    Alcohol Level: No results found for this basename: ETH,  in the last 168 hours  CT of the brain  09/23/2013    1. No acute intracranial process. 2. Atrophy with chronic microvascular ischemic disease, slightly progressed relative to prior CT from 11/13/2011   MRI of the brain  09/23/2013   Suggestion tiny acute hemorrhagic infarct mid left peri operculum region.  Tiny area of blood breakdown products right parietal lobe and left frontal lobe probably represent result of remote hemorrhage ischemia.  Small vessel disease type changes.  Global atrophy.  Partial opacification ethmoid sinus air cells, left sphenoid sinus and maxillary sinuses.    MRA of the  brain  09/23/2013 Normal intracranial MR angiography of the large and medium size vessels.  2D Echocardiogram  EF - 55-60%. No cardiac source of emboli identified.  Carotid Doppler - preliminary report -  1-39% ICA stenosis. Vertebral artery flow is antegrade.    CXR    EKG  normal sinus rhythm, PAC's noted. For complete results please see formal report.   Therapy Recommendations no PT  Physical Exam   Pleasant elderly caucasian male not in distress.Awake alert. Afebrile. Head is nontraumatic. Neck is supple without bruit. Hearing is normal. Cardiac exam no murmur or gallop. Lungs are clear to auscultation. Distal pulses are well felt. Neurological Exam ;  Awake  Alert oriented x 3. Normal speech and language.eye movements full without nystagmus.fundi were not visualized. Vision acuity and fields appear normal. Hearing is normal. Palatal movements are normal. Face symmetric. Tongue midline. Normal strength, tone, reflexes and coordination. Normal sensation. Gait deferred.   ASSESSMENT [name] is a 73 y.o. male presenting with dysarthria. Imaging confirms a left mid peri operculum infarct with tiny hemorrhagic transformation. Infarct felt to be embolic secondary to unknown source. Question hx of atrial fibrillation.  On aspirin 81 mg orally every day prior to admission. Now on aspirin 81 mg orally every day for secondary stroke prevention. Stroke work up complete.  hypertension Hyperlipidemia, LDL 52, on fish oil and zocor 20 mg daily PTA, now on zocor 20 mg daily, goal LDL < 100 CAD - Hx post CABG atrial fibrillation per Dr. Claiborne Billings note Oct 2014, has been on coumadin in the past per pt  Obesity, Body mass index is 30.2 kg/(m^2).  Family hx stroke (father)  Hemoglobin A1c 6.2  Hospital day # 2  TREATMENT/PLAN  ASA 325 mg QD for 1 week. Then start Xarelto.  Need to find out from Dr. Claiborne Billings if history of atrial fibrillation just related to post CABG or is an  independent standing diagnosis. If it is a stand alone diagnoses, will need anticoagulation at time of discharge, we prefer NOACs if so  Discharge planned  F/U Dr Leonie Man 2 months.    Mikey Bussing PA-C [facility] Pager [phone] 09/25/2013, 10:14 AM  I have personally obtained a history, examined the patient, evaluated imaging results, and formulated the assessment and plan of care. I agree with the above.  Antony Contras, MD  To contact Stroke Continuity provider, please refer to [email or web address] After hours, contact General Neurology

## 2013-09-25 NOTE — Progress Notes (Signed)
Pt. Andrews home via car with family.  DC instructions given to patient and family.  Vital signs and assessments were stable.

## 2013-09-25 NOTE — Discharge Summary (Addendum)
Physician Discharge Summary  NAME:Shane Espinoza  KYH:[id number]  DOB: February 01, 1941   Admit date: 09/23/2013 Discharge date: 09/25/2013  Discharge Diagnoses:  Principal Problem:   Acute thrombotic stroke - presented with dysarthria which has resolved Active Problems:   CAD (coronary artery disease)   Hypertension   S/P CABG x 4   HTN (hypertension)   Hyperlipidemia LDL goal < 70   CVA (cerebral infarction) - tiny left hemorrhagic/possibly initially thrombotic periopercular stroke.            No evidence of mural thrombus or carotid or intracranial stenosis.   Antithrombotic therapy - decision pending per neurology. Currently on aspirin      Discharge Physical Exam:  General Appearance: Alert, cooperative, no distress, appears stated age  Weight change:  No intake or output data in the 24 hours ending 09/25/13 0749 Filed Vitals:   09/24/13 1815 09/24/13 2022 09/25/13 0403 09/25/13 0639  BP: 122/65 138/68 128/62 137/82  Pulse: 61 68 65 74  Temp: 97.9 F (36.6 C) 97.9 F (36.6 C) 97.3 F (36.3 C) 97.8 F (36.6 C)  TempSrc: Oral Oral Oral Oral  Resp: 18 20 20 17   Height:      Weight:      SpO2: 100% 100% 97% 98%    Lungs: Clear to auscultation bilaterally, respirations unlabored Heart: Regular rate and rhythm, S1 and S2 normal, no murmur, rub or gallop Abdomen: Soft, non-tender, bowel sounds active all four quadrants, no masses, no organomegaly Extremities: Extremities normal, atraumatic, no cyanosis or edema Neuro: Oriented x3, nonfocal, dysarthria almost completely resolved  Discharge Condition: Improved  Hospital Course: [name] is a 73 y.o. male with history of CAD status post CVA, hypertension, hyperlipidemia and atrial fibrillation who started experiencing difficulty speaking around 11:30 AM day before yesterday. Patient also was having difficulty processing thoughts. This persisted and his wife called the primary care physician's office and  was advised to go to the ER. Patient went to the ER by around 2:00 PM on the day of admission as per the family. CT head did not show anything acute. Patient was seen by neurologist. MRI brain which showed tiny left opperculum hemorrhage infarct which as per the neurologist may be thrombotic. Patient did not have any loss of function of the upper lower extremities did not have any dizziness headache visual symptoms difficulty swallowing. Denied any chest pain palpitations or any shortness of breath. Subsequent studies revealed:  MRA of the brain revealed no intracranial stenoses or carotid stenoses.  Carotid Dopplers confirmed no carotid stenoses. 2-D echocardiogram revealed EF of 55-60% no mural thrombi or focal wall motion abnormalities  Dysarthria has essentially resolved.  Decision now pending about whether to discharge him on anticoagulation therapy or not. History of atrial fibrillation in the past.   Things to follow up in the outpatient setting: Need to followup on blood pressure and heart rhythm and follow for any neurological changes  Consults: [facility] inpatient Neurology  Disposition: 01-Home or Self Care  Discharge Orders   Future Appointments Provider Department Dept Phone   10/09/2013 11:45 AM Troy Sine, MD [facility] [phone]   06/17/2014 8:00 AM Hayden Pedro, MD [facility] [phone]   Future Orders Complete By Expires   Call MD for:  difficulty breathing, headache or visual disturbances  As directed    Call MD for:  persistant dizziness or light-headedness  As directed    Call MD for:  persistant nausea  and vomiting  As directed    Call MD for:  severe uncontrolled pain  As directed    Call MD for:  temperature >100.4  As directed    Diet - low sodium heart healthy  As directed    Increase activity slowly  As directed        Medication List         amLODipine 5 MG tablet  Commonly known as:  NORVASC  Take 1 tablet  (5 mg total) by mouth daily.     aspirin 81 MG EC tablet  Take 4  81 mg by mouth daily. Swallow whole. Equals 325 mg daily     beta carotene w/minerals tablet  Take 1 tablet by mouth daily.     cholecalciferol 1000 UNITS tablet  Commonly known as:  VITAMIN D  Take 5,000 Units by mouth 3 (three) times a week. Monday, Wednesday, friday     fish oil-omega-3 fatty acids 1000 MG capsule  Take 1,200 mg by mouth daily.     metoprolol tartrate 25 MG tablet  Commonly known as:  LOPRESSOR  Take 0.5 tablets (12.5 mg total) by mouth 2 (two) times daily.     simvastatin 20 MG tablet  Commonly known as:  ZOCOR  Take 1 tablet (20 mg total) by mouth at bedtime.     valsartan-hydrochlorothiazide 320-25 MG per tablet  Commonly known as:  DIOVAN-HCT  Take 1 tablet by mouth daily.         The results of significant diagnostics from this hospitalization (including imaging, microbiology, ancillary and laboratory) are listed below for reference.    Significant Diagnostic Studies: Ct Head (brain) Wo Contrast  09/23/2013   CLINICAL DATA:  Slurred speech  EXAM: CT HEAD WITHOUT CONTRAST  TECHNIQUE: Contiguous axial images were obtained from the base of the skull through the vertex without intravenous contrast.  COMPARISON:  Prior CT from 11/13/2011  FINDINGS: There is no acute intracranial hemorrhage or infarct. No mass lesion or midline shift. Gray-white matter differentiation is well maintained. Ventricles are normal in size without evidence of hydrocephalus. CSF containing spaces are within normal limits. No extra-axial fluid collection. Atrophy with chronic microvascular ischemic disease again noted, slightly progressed.  The calvarium is intact.  Orbital soft tissues are within normal limits.  The paranasal sinuses and mastoid air cells are well pneumatized and free of fluid.  Scalp soft tissues are unremarkable.  IMPRESSION: 1. No acute intracranial process. 2. Atrophy with chronic microvascular  ischemic disease, slightly progressed relative to prior CT from 11/13/2011 Critical Value/emergent results were called by telephone at the time of interpretation on 09/23/2013 at 3:00 PM to Dr. Kathrynn Speed , who verbally acknowledged these results.   Electronically Signed   By: Jeannine Boga M.D.   On: 09/23/2013 15:03   Mr Brain Wo Contrast  09/23/2013   CLINICAL DATA:  Slurred speech.  Hypertension.  Atrial fibrillation.  EXAM: MRI HEAD WITHOUT CONTRAST  TECHNIQUE: Multiplanar, multiecho pulse sequences of the brain and surrounding structures were obtained without intravenous contrast.  COMPARISON:  09/23/2013 CT.  FINDINGS: Suggestion tiny acute hemorrhagic infarct mid left peri operculum region.  Tiny area of blood breakdown products right parietal lobe and left frontal lobe probably represent result of remote hemorrhage ischemia.  Small vessel disease type changes.  No intracranial mass lesion noted on this unenhanced exam.  Global atrophy. Ventricular prominence felt to be related to atrophy rather than hydrocephalus.  Partial opacification ethmoid sinus air cells, left  sphenoid sinus maxillary sinuses.  Major intracranial vascular structures are patent. 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  Cervical medullary junction, pituitary region, pineal region and orbital structures unremarkable.  IMPRESSION: Suggestion tiny acute hemorrhagic infarct mid left peri operculum region.  Tiny area of blood breakdown products right parietal lobe and left frontal lobe probably represent result of remote hemorrhage ischemia.  Small vessel disease type changes.  Global atrophy.  Partial opacification ethmoid sinus air cells, left sphenoid sinus and maxillary sinuses.  These results were called by telephone at the time of interpretation on 09/23/2013 at 7:05 PM to Dr. Reather Converse, who verbally acknowledged these results.   Electronically Signed   By: Chauncey Cruel M.D.   On:  09/23/2013 19:09   Mr Jodene Nam Head/brain Wo Cm  09/24/2013   CLINICAL DATA:  Infarcts left hemisphere  EXAM: MRA HEAD WITHOUT CONTRAST  TECHNIQUE: Angiographic images of the Circle of Willis were obtained using MRA technique without intravenous contrast.  COMPARISON:  MRI brain 09/23/2013  FINDINGS: Both internal carotid arteries are widely patent into the brain. The anterior and middle cerebral vessels are normal without proximal stenosis, aneurysm or vascular malformation. Both vertebral arteries are widely patent to the basilar. No basilar stenosis. Posterior circulation branch vessels are normal.  IMPRESSION: Normal intracranial MR angiography of the large and medium size vessels.   Electronically Signed   By: Nelson Chimes M.D.   On: 09/24/2013 12:27    Microbiology: No results found for this or any previous visit (from the past 240 hour(s)).   Labs: Results for orders placed during the hospital encounter of 09/23/13  CBC      Result Value Ref Range   WBC 9.2  4.0 - 10.5 K/uL   RBC 5.17  4.22 - 5.81 MIL/uL   Hemoglobin 15.7  13.0 - 17.0 g/dL   HCT 46.4  39.0 - 52.0 %   MCV 89.7  78.0 - 100.0 fL   MCH 30.4  26.0 - 34.0 pg   MCHC 33.8  30.0 - 36.0 g/dL   RDW 13.6  11.5 - 15.5 %   Platelets 226  150 - 400 K/uL  DIFFERENTIAL      Result Value Ref Range   Neutrophils Relative % 47  43 - 77 %   Lymphocytes Relative 39  12 - 46 %   Monocytes Relative 10  3 - 12 %   Eosinophils Relative 4  0 - 5 %   Basophils Relative 0  0 - 1 %   Neutro Abs 4.3  1.7 - 7.7 K/uL   Lymphs Abs 3.6  0.7 - 4.0 K/uL   Monocytes Absolute 0.9  0.1 - 1.0 K/uL   Eosinophils Absolute 0.4  0.0 - 0.7 K/uL   Basophils Absolute 0.0  0.0 - 0.1 K/uL   WBC Morphology ATYPICAL LYMPHOCYTES    COMPREHENSIVE METABOLIC PANEL      Result Value Ref Range   Sodium 143  137 - 147 mEq/L   Potassium 3.2 (*) 3.7 - 5.3 mEq/L   Chloride 101  96 - 112 mEq/L   CO2 24  19 - 32 mEq/L   Glucose, Bld 165 (*) 70 - 99 mg/dL   BUN 19  6 -  23 mg/dL   Creatinine, Ser 0.95  0.50 - 1.35 mg/dL   Calcium 9.9  8.4 - 10.5 mg/dL   Total Protein 7.2  6.0 - 8.3 g/dL   Albumin 3.7  3.5 - 5.2 g/dL  AST 30  0 - 37 U/L   ALT 28  0 - 53 U/L   Alkaline Phosphatase 61  39 - 117 U/L   Total Bilirubin 0.6  0.3 - 1.2 mg/dL   GFR calc non Af Amer 81 (*) >90 mL/min   GFR calc Af Amer >90  >90 mL/min  COMPREHENSIVE METABOLIC PANEL      Result Value Ref Range   Sodium 144  137 - 147 mEq/L   Potassium 3.9  3.7 - 5.3 mEq/L   Chloride 103  96 - 112 mEq/L   CO2 26  19 - 32 mEq/L   Glucose, Bld 101 (*) 70 - 99 mg/dL   BUN 15  6 - 23 mg/dL   Creatinine, Ser 0.80  0.50 - 1.35 mg/dL   Calcium 9.2  8.4 - 10.5 mg/dL   Total Protein 6.7  6.0 - 8.3 g/dL   Albumin 3.3 (*) 3.5 - 5.2 g/dL   AST 25  0 - 37 U/L   ALT 22  0 - 53 U/L   Alkaline Phosphatase 54  39 - 117 U/L   Total Bilirubin 0.7  0.3 - 1.2 mg/dL   GFR calc non Af Amer 87 (*) >90 mL/min   GFR calc Af Amer >90  >90 mL/min  CBC WITH DIFFERENTIAL      Result Value Ref Range   WBC 9.8  4.0 - 10.5 K/uL   RBC 4.81  4.22 - 5.81 MIL/uL   Hemoglobin 14.7  13.0 - 17.0 g/dL   HCT 43.2  39.0 - 52.0 %   MCV 89.8  78.0 - 100.0 fL   MCH 30.6  26.0 - 34.0 pg   MCHC 34.0  30.0 - 36.0 g/dL   RDW 13.5  11.5 - 15.5 %   Platelets 203  150 - 400 K/uL   Neutrophils Relative % 42 (*) 43 - 77 %   Neutro Abs 4.1  1.7 - 7.7 K/uL   Lymphocytes Relative 40  12 - 46 %   Lymphs Abs 3.9  0.7 - 4.0 K/uL   Monocytes Relative 12  3 - 12 %   Monocytes Absolute 1.2 (*) 0.1 - 1.0 K/uL   Eosinophils Relative 5  0 - 5 %   Eosinophils Absolute 0.5  0.0 - 0.7 K/uL   Basophils Relative 0  0 - 1 %   Basophils Absolute 0.0  0.0 - 0.1 K/uL  TSH      Result Value Ref Range   TSH 2.456  0.350 - 4.500 uIU/mL  HEMOGLOBIN A1C      Result Value Ref Range   Hemoglobin A1C 6.2 (*) <5.7 %   Mean Plasma Glucose 131 (*) <117 mg/dL  LIPID PANEL      Result Value Ref Range   Cholesterol 112  0 - 200 mg/dL   Triglycerides 112   <150 mg/dL   HDL 38 (*) >39 mg/dL   Total CHOL/HDL Ratio 2.9     VLDL 22  0 - 40 mg/dL   LDL Cholesterol 52  0 - 99 mg/dL  CBG MONITORING, ED      Result Value Ref Range   Glucose-Capillary 156 (*) 70 - 99 mg/dL  I-STAT TROPOININ, ED      Result Value Ref Range   Troponin i, poc 0.01  0.00 - 0.08 ng/mL   Comment 3           I-STAT CHEM 8, ED      Result Value  Ref Range   Sodium 144  137 - 147 mEq/L   Potassium 3.0 (*) 3.7 - 5.3 mEq/L   Chloride 103  96 - 112 mEq/L   BUN 19  6 - 23 mg/dL   Creatinine, Ser 1.10  0.50 - 1.35 mg/dL   Glucose, Bld 174 (*) 70 - 99 mg/dL   Calcium, Ion 1.22  1.13 - 1.30 mmol/L   TCO2 25  0 - 100 mmol/L   Hemoglobin 16.3  13.0 - 17.0 g/dL   HCT 48.0  39.0 - 52.0 %    Time coordinating discharge: 34 minutes  Signed: Henrine Screws, MD 09/25/2013, 7:49 AM

## 2013-09-25 NOTE — Progress Notes (Signed)
OT Cancellation Note  Patient Details Name: Shane Espinoza MRN: [id number] DOB: March 30, 1941   Cancelled Treatment:     Orders received and chart reviewed. Per PT assessment, pt is Mod I and w/o acute needs. Chart review also indicates that pt is being d/c home secondary to resolution of symptoms. D/c summary and orders in chart. Will sign off acute OT at this time.  Josephine Igo Dixon 09/25/2013, 9:35 AM

## 2013-10-01 ENCOUNTER — Telehealth: Payer: Self-pay | Admitting: Neurology

## 2013-10-01 NOTE — Telephone Encounter (Signed)
Please call pt's wife [name] to schedule pt with Dr. Leonie Man. Per Dr. Leonie Man advised pt to get a 1 mth f/u stroke 09/23/13. I don't see anything until July. Please call [name] to schedule pt in. Thanks

## 2013-10-01 NOTE — Telephone Encounter (Signed)
Called pt and spoke with pt's wife [name] to make an appt on 11/25/13 with Dr. Leonie Man. I advised the pt's wife that if the pt has any other problems, questions or concerns to call the office. Pt's wife verbalized understanding.

## 2013-10-09 ENCOUNTER — Encounter: Payer: Self-pay | Admitting: Cardiovascular Disease

## 2013-10-09 ENCOUNTER — Ambulatory Visit (INDEPENDENT_AMBULATORY_CARE_PROVIDER_SITE_OTHER): Payer: Medicare Other | Admitting: Cardiovascular Disease

## 2013-10-09 ENCOUNTER — Telehealth: Payer: Self-pay | Admitting: Pharmacist Clinician (PhC)/ Clinical Pharmacy Specialist

## 2013-10-09 VITALS — BP 133/73 | HR 63 | Ht 69.0 in | Wt 204.9 lb

## 2013-10-09 DIAGNOSIS — E785 Hyperlipidemia, unspecified: Secondary | ICD-10-CM

## 2013-10-09 DIAGNOSIS — I6330 Cerebral infarction due to thrombosis of unspecified cerebral artery: Secondary | ICD-10-CM

## 2013-10-09 DIAGNOSIS — I6350 Cerebral infarction due to unspecified occlusion or stenosis of unspecified cerebral artery: Secondary | ICD-10-CM

## 2013-10-09 DIAGNOSIS — I639 Cerebral infarction, unspecified: Secondary | ICD-10-CM

## 2013-10-09 DIAGNOSIS — I10 Essential (primary) hypertension: Secondary | ICD-10-CM

## 2013-10-09 DIAGNOSIS — I2510 Atherosclerotic heart disease of native coronary artery without angina pectoris: Secondary | ICD-10-CM

## 2013-10-09 DIAGNOSIS — Z951 Presence of aortocoronary bypass graft: Secondary | ICD-10-CM

## 2013-10-09 NOTE — Telephone Encounter (Signed)
Wife LMOM asking questions about new medication Xarelto - saw ads on TV/paper about problems.  Spoke with both patient and wife - pt had small thrombotic stroke in March and at discharge was given Xarelto.  Pt then saw ad in newspaper from legal office for people who had taken Xarelto and became nervous about the med.  Reassured patient and wife that Xarelto is as safe as coumadin (pt was on previously) for prevention of stroke.  Explained that these types of ads are common with most new drugs, but that as long as they are aware of bleeding issues and safety, he should be fine.  Explained that xarelto best when taken with evening meal to increase absorption.  Advised they call with any questions or concerns.

## 2013-10-09 NOTE — Patient Instructions (Signed)
Your physician recommends that you schedule a follow-up appointment in: 6 Weeks  

## 2013-10-14 ENCOUNTER — Encounter: Payer: Self-pay | Admitting: Cardiovascular Disease

## 2013-10-14 NOTE — Progress Notes (Signed)
Patient ID: Shane Espinoza, male   DOB: Jun 12, 1941, 73 y.o.   MRN: [id number]     HPI: Shane Espinoza is a 73 y.o. male who presents to the office for six-month cardiology evaluation.  [name] underwent CABG revascularization surgery on 03/07/2011 after a nuclear perfusion study revealed significant scar/ischemia and cardiac catheterization demonstrated severe multivessel CAD. CABG surgery was done by Dr. Nils Pyle on 03/07/2011 with LIMA to the LAD, vein to the OM, vein to the RV marginal, and vein to the PDA. He did develop PAF postoperatively and underwent TEE guided cardioversion with restoration of sinus rhythm. He completed cardiac rehabilitation. A nuclear perfusion study in August 2013 was markedly improved did not reveal any region of scar or ischemia. He has been off amiodarone and maintaining sinus rhythm. Additional problems include hypertension, hyperlipidemia, and in the past he's had issues with leg swelling.  Since I last saw him in October 2014, he has remained fairly stable without recurrent chest pain.  However, he developed a CVA on 09/23/2013 and had difficulty processing thoughts.  An MRI of his brain showed a tiny left operculum, hemorrhage, infarct, is concerned that potentially this was thrombotic.  He did not have any loss of function of the upper or lower extremities and denied any headache, dizziness, visual symptoms, or difficulty swallowing.  His ECG at that time did suggest sinus rhythm.  He now is on anticoagulation with Xarelto 20 mg, which he just started several days ago.  His head CT was negative as was his MRA of the large and medium size vessels.  His carotids were widely patent.  He denies chest pain.  He denies any significant neurologic sequelae.  Past Medical History  Diagnosis Date  . Hypertension   . Anxiety   . 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 Atrial fibrillation   . CAD (coronary artery disease)     Shane Espinoza  . Gout      Past Surgical History  Procedure Laterality Date  . Coronary artery bypass graft  03/07/2011    DR.VAN  Espinoza  . Eye surgery    . Colon surgery    . Vascular doppler  03/03/2011    Nosignificant extracranial carotid artery stenosis  . Cardiac catheterization  03/02/2011    Recommended CABG  . Lexiscan myoview  02/20/2012    EKG negative for ischemia, noraml study  . Transthoracic echocardiogram  02/20/2012    EF 50-55%, mild-moderate concentric LVH, LA moderately dilated, moderate mitral regurg    No Known Allergies  Current Outpatient Prescriptions  Medication Sig Dispense Refill  . amLODipine (NORVASC) 5 MG tablet Take 1 tablet (5 mg total) by mouth daily.  90 tablet  3  . beta carotene w/minerals (OCUVITE) tablet Take 1 tablet by mouth daily.        . cholecalciferol (VITAMIN D) 1000 UNITS tablet Take 5,000 Units by mouth 3 (three) times a week. Monday, Wednesday, friday      . fish oil-omega-3 fatty acids 1000 MG capsule Take 1,200 mg by mouth daily.        . metoprolol tartrate (LOPRESSOR) 25 MG tablet Take 0.5 tablets (12.5 mg total) by mouth 2 (two) times daily.  90 tablet  3  . Rivaroxaban (XARELTO) 20 MG TABS tablet Take 20 mg by mouth daily with supper.      . simvastatin (ZOCOR) 20 MG tablet Take 1 tablet (20 mg total) by mouth at bedtime.  90 tablet  3  . valsartan-hydrochlorothiazide (DIOVAN-HCT) 320-25 MG per tablet Take 1 tablet by mouth daily.  90 tablet  3  . [DISCONTINUED] amiodarone (PACERONE) 200 MG tablet Take 200 mg by mouth daily.        No current facility-administered medications for this visit.    History   Social History  . Marital Status: Married    Spouse Name: N/A    Number of Children: N/A  . Years of Education: N/A   Occupational History  . Not on file.   Social History Main Topics  . Smoking status: Never Smoker   . Smokeless tobacco: Never Used  . Alcohol Use: No  . Drug Use: No  . Sexual Activity: Not on file   Other Topics  Concern  . Not on file   Social History Narrative  . No narrative on file    Family History  Problem Relation Age of Onset  . CVA Father   . Heart attack Maternal Grandmother   . Heart attack Maternal Grandfather   . Heart murmur Sister   . Hypertension Son    Socially he is married has 2 children 4 grandchildren. He now exercises fairly regularly. There is no tobacco or alcohol use.  ROS is negative for fevers, chills or night sweats. He denies visual symptoms. He denies cough. He denies wheezing. He denies syncope. He denies chest pressure. He does have hemorrhoids. Apparently is undergoing hemorrhoidal banding by Dr. Earlean Shawl. He was found last year at to have 3 polyps and underwent colonoscopy. He denies abdominal pain nausea vomiting or diarrhea. He denies hematuria. He denies claudication symptoms. He has noticed for her charley horses in his legs. He denies endocrine issues.  He denies any further neurologic symptoms.  He denies seizures.  Other comprehensive 14 point system review is negative.  PE BP 133/73  Pulse 63  Ht 5\' 9"  (1.753 m)  Wt 204 lb 14.4 oz (92.942 kg)  BMI 30.24 kg/m2  General: Alert, oriented, no distress.  Skin: normal turgor, no rashes HEENT: Normocephalic, atraumatic. Pupils round and reactive; sclera anicteric;no lid lag.  Nose without nasal septal hypertrophy Mouth/Parynx benign; Mallinpatti scale 3 Neck: No JVD, no carotid bruits Lungs: clear to ausculatation and percussion; no wheezing or rales Chest wall: Nontender to palpation Heart: Irregularly irregular rhythm with a controlled ventricular response in the 60s. s1 s2 normal  1/6 sem Abdomen: Mild central adiposity ; soft, nontender; no hepatosplenomehaly, BS+; abdominal aorta nontender and not dilated by palpation. Pulses 2+ Back: No CVA tenderness Extremities: no clubbing cyanosis or edema, Homan's sign negative  Neurologic: grossly nonfocal Psychologic: Normal affect and mood  ECG  (independently read by me): Atrial fibrillation with controlled ventricular response at 63 beats per minute.  Prior April 16 2013 ECG: Sinus bradycardia at 52 beats per minute.  LABS:  BMET    Component Value Date/Time   NA 144 09/24/2013 0403   K 3.9 09/24/2013 0403   CL 103 09/24/2013 0403   CO2 26 09/24/2013 0403   GLUCOSE 101* 09/24/2013 0403   BUN 15 09/24/2013 0403   CREATININE 0.80 09/24/2013 0403   CALCIUM 9.2 09/24/2013 0403   GFRNONAA 87* 09/24/2013 0403   GFRAA >90 09/24/2013 0403     Hepatic Function Panel     Component Value Date/Time   PROT 6.7 09/24/2013 0403   ALBUMIN 3.3* 09/24/2013 0403   AST 25 09/24/2013 0403   ALT 22 09/24/2013 0403   ALKPHOS 54 09/24/2013 0403  BILITOT 0.7 09/24/2013 0403     CBC    Component Value Date/Time   WBC 9.8 09/24/2013 0403   RBC 4.81 09/24/2013 0403   HGB 14.7 09/24/2013 0403   HCT 43.2 09/24/2013 0403   PLT 203 09/24/2013 0403   MCV 89.8 09/24/2013 0403   MCH 30.6 09/24/2013 0403   MCHC 34.0 09/24/2013 0403   RDW 13.5 09/24/2013 0403   LYMPHSABS 3.9 09/24/2013 0403   MONOABS 1.2* 09/24/2013 0403   EOSABS 0.5 09/24/2013 0403   BASOSABS 0.0 09/24/2013 0403     BNP No results found for this basename: probnp    Lipid Panel     Component Value Date/Time   CHOL 112 09/24/2013 0403     RADIOLOGY: No results found.    ASSESSMENT AND PLAN: Mr. Sperl is 2 1/2 years status post CABG revascularization surgery for severe multivessel CAD. I did review the laboratory that he recently had done in June by his primary physician. Glucose was mildly increased at 109. Cholesterol is 119 triglycerides 124 LDL 61 HDL 37.  He had normal renal function.  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  His ECG today demonstrates atrial fibrillation, and I suspect this was due to 2.  PAF.  He is now on Xarelto anticoagulation therapy.  He is unaware of his heart rate being irregular.  He denies awareness of presyncope were  tacky palpitations.  His blood pressure today is controlled on current therapy.  Normally, he did undergo TEE guided cardioversion with restoration of sinus rhythm postoperatively in 2012.  I will see him in 6 weeks for cardiology followup evaluation per that time upon his tolerance to a 2 fibrillation either continued rate control or another attempt at cardioversion after at least one month of anticoagulation will be discussed.    Troy Sine, MD, [facility]  10/14/2013 6:22 PM

## 2013-11-25 ENCOUNTER — Ambulatory Visit (INDEPENDENT_AMBULATORY_CARE_PROVIDER_SITE_OTHER): Payer: Medicare Other | Admitting: Nurse Practitioner

## 2013-11-25 ENCOUNTER — Encounter: Payer: Self-pay | Admitting: Nurse Practitioner

## 2013-11-25 VITALS — BP 119/70 | HR 67 | Ht 69.0 in | Wt 207.0 lb

## 2013-11-25 DIAGNOSIS — I6350 Cerebral infarction due to unspecified occlusion or stenosis of unspecified cerebral artery: Secondary | ICD-10-CM

## 2013-11-25 DIAGNOSIS — I639 Cerebral infarction, unspecified: Secondary | ICD-10-CM

## 2013-11-25 NOTE — Progress Notes (Signed)
PATIENT: Shane Espinoza DOB: 1940-10-01  REASON FOR VISIT: hospital follow up for stroke HISTORY FROM: patient  HISTORY OF PRESENT ILLNESS: MOUSSA WIEGAND is an 73 y.o. 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 He also noted he was having some difficulty getting his thoughts out and some slow mentation. These symptoms waxed and waned but never fully resolved. He went to a financial event when at 60 his wife noted the speech had become worse. He was brought to [facility] ED where Code stroke was called. He had no other symptoms. Patient was not administerd TPA secondary to minimal symptoms. He was admitted for further evaluation and treatment.  MRI brain showed a tiny acute hemorrhagic infarct mid left peri operculum region and global atrohy. MRA head was normal.  Infarct felt to be embolic secondary to unknown source.  2D Echo showed an EF - 55-60% with no cardiac source of emboli identified.  Carotid doppler no significant carotid stenosis.  Hgb A1C was 6.2, LDL 52.  Was on Coumadin in the past post CABG in 2012.  He was discharged on Xarelto.  At recent office visit with Dr. Claiborne Billings he was found to be in afib.  He is unaware of his heart rate being irregular.  He has been tolerating Xarelto well without any known side effects.  He has no residual neurological deficits.   REVIEW OF SYSTEMS: Full 14 system review of systems performed and notable only for:  No complaints.  ALLERGIES: No Known Allergies  HOME MEDICATIONS: Outpatient Prescriptions Prior to Visit  Medication Sig Dispense Refill  . amLODipine (NORVASC) 5 MG tablet Take 1 tablet (5 mg total) by mouth daily.  90 tablet  3  . beta carotene w/minerals (OCUVITE) tablet Take 1 tablet by mouth daily.        . cholecalciferol (VITAMIN D) 1000 UNITS tablet Take 5,000 Units by mouth 3 (three) times a week. Monday, Wednesday, friday      . fish oil-omega-3 fatty acids 1000 MG  capsule Take 1,200 mg by mouth daily.        . metoprolol tartrate (LOPRESSOR) 25 MG tablet Take 0.5 tablets (12.5 mg total) by mouth 2 (two) times daily.  90 tablet  3  . Rivaroxaban (XARELTO) 20 MG TABS tablet Take 20 mg by mouth daily with supper.      . simvastatin (ZOCOR) 20 MG tablet Take 1 tablet (20 mg total) by mouth at bedtime.  90 tablet  3  . valsartan-hydrochlorothiazide (DIOVAN-HCT) 320-25 MG per tablet Take 1 tablet by mouth daily.  90 tablet  3   No facility-administered medications prior to visit.    PHYSICAL EXAM  Filed Vitals:   11/25/13 1507  BP: 119/70  Pulse: 67  Height: 5\' 9"  (1.753 m)  Weight: 207 lb (93.895 kg)   Body mass index is 30.55 kg/(m^2).  Visual Acuity Screening   Right eye Left eye Both eyes  Without correction: 20/50 20/30-1   With correction:       Generalized: Well developed, in no acute distress  Head: normocephalic and atraumatic. Oropharynx benign  Neck: Supple, no carotid bruits  Cardiac: Regular rate rhythm, no murmur  Musculoskeletal: No deformity   Neurological examination  Mentation: Alert oriented to time, place, history taking. Follows all commands speech and language fluent Cranial nerve II-XII: Pupils were equal round reactive to light extraocular movements were full, visual field were full on  confrontational test. Facial sensation and strength were normal. hearing was intact to finger rubbing bilaterally. Uvula tongue midline. head turning and shoulder shrug and were normal and symmetric.Tongue protrusion into cheek strength was normal. Motor: The motor testing reveals 5 over 5 strength of all 4 extremities. Good symmetric motor tone is noted throughout.  Sensory: Sensory testing is intact to pinprick, soft touch, vibration sensation, and position sense on all 4 extremities. No evidence of extinction is noted.  Coordination: Cerebellar testing reveals good finger-nose-finger and heel-to-shin bilaterally.  Gait and station: Gait  is normal. Tandem gait is normal. Romberg is negative. No drift is seen.  Reflexes: Deep tendon reflexes are symmetric and normal bilaterally. Toes are downgoing bilaterally.   ASSESSMENT AND PLAN Mr. COLSTON PYLE is a 73 y.o. male presenting with dysarthria and trouble collecting his thoughts. Imaging confirmed a left mid peri operculum infarct with tiny hemorrhagic transformation. Infarct felt to be embolic secondary to paroxismal atrial fibrillation. Vascular risk factors of CAD, Hyperlipidemia, Obesity, HTN, PAF. He has no residual neurological deficits.  PLAN: I had a long discussion with the patient and partner regarding his recent stroke, discussed results of evaluation in the hospital and answered questions.  We discussed carb-modified diet and regular exercise to reduce Hgb A1c.  Continue Xarelto  for atrial fibrillation and as secondary stroke prevention and maintain strict control of hypertension with blood pressure goal below 140/90, and lipids with LDL cholesterol goal below 100 mg/dL.  Patient requests to follow up in our office as needed, he is closely monitored by PCP and Dr. Claiborne Billings.  I let him know that would be fine, we are here if he needs [place].  Philmore Pali, MSN, NP-C 11/25/2013, 8:06 PM [facility] [address] [place], [place] [address] [phone]  Note: This document was prepared with digital dictation and possible smart phrase technology. Any transcriptional errors that result from this process are unintentional.

## 2013-11-25 NOTE — Patient Instructions (Addendum)
PLAN: Continue Xarelto  for atrial fibrillation and as secondary stroke prevention and maintain strict control of hypertension with blood pressure goal below 140/90, and lipids with LDL cholesterol goal below 100 mg/dL.  Followup in the future in 3 months.  STROKE/TIA INSTRUCTIONS SMOKING Cigarette smoking nearly doubles your risk of having a stroke & is the single most alterable risk factor  If you smoke or have smoked in the last 12 months, you are advised to quit smoking for your health.  Most of the excess cardiovascular risk related to smoking disappears within a year of stopping.  Ask you doctor about anti-smoking medications  [place] Quit Line: 1-800-QUIT NOW  Free Smoking Cessation Classes [phone]  CHOLESTEROL Know your levels; limit fat & cholesterol in your diet  Lab Results  Component Value Date   CHOL 112 09/24/2013   HDL 38* 09/24/2013   LDLCALC 52 09/24/2013   TRIG 112 09/24/2013   CHOLHDL 2.9 09/24/2013      Many patients benefit from treatment even if their cholesterol is at goal.  Goal: Total Cholesterol less than 160  Goal:  LDL less than 100  Goal:  HDL greater than 40  Goal:  Triglycerides less than 150  BLOOD PRESSURE American Stroke Association blood pressure target is less that 120/80 mm/Hg  Your discharge blood pressure is:  BP: 119/70 mmHg  Monitor your blood pressure  Limit your salt and alcohol intake  Many individuals will require more than one medication for high blood pressure  DIABETES (A1c is a blood sugar average for last 3 months) Goal A1c is under 7% (A1c is blood sugar average for last 3 months)  Diabetes: No known diagnosis of diabetes    Lab Results  Component Value Date   HGBA1C 6.2* 09/24/2013    Your A1c can be lowered with medications, healthy diet, and exercise.  Check your blood sugar as directed by your physician  Call your physician if you experience unexplained or low blood sugars.  PHYSICAL ACTIVITY/REHABILITATION Goal  is 30 minutes at least 4 days per week    Activity decreases your risk of heart attack and stroke and makes your heart stronger.  It helps control your weight and blood pressure; helps you relax and can improve your mood.  Participate in a regular exercise program.  Talk with your doctor about the best form of exercise for you (dancing, walking, swimming, cycling).  DIET/WEIGHT Goal is to maintain a healthy weight  Your height is:  Height: 5\' 9"  (175.3 cm) Your current weight is: Weight: 207 lb (93.895 kg) Your body Mass Index (BMI) is:  BMI (Calculated): 30.6  Following the type of diet specifically designed for you will help prevent another stroke.  Your goal Body Mass Index (BMI) is 19-24.  Healthy food habits can help reduce 3 risk factors for stroke:  High cholesterol, hypertension, and excess weight.

## 2013-12-05 ENCOUNTER — Encounter: Payer: Self-pay | Admitting: Cardiovascular Disease

## 2013-12-05 ENCOUNTER — Ambulatory Visit (INDEPENDENT_AMBULATORY_CARE_PROVIDER_SITE_OTHER): Payer: Medicare Other | Admitting: Cardiovascular Disease

## 2013-12-05 VITALS — BP 108/62 | HR 74 | Ht 69.0 in | Wt 204.6 lb

## 2013-12-05 DIAGNOSIS — I2510 Atherosclerotic heart disease of native coronary artery without angina pectoris: Secondary | ICD-10-CM

## 2013-12-05 DIAGNOSIS — I4891 Unspecified atrial fibrillation: Secondary | ICD-10-CM

## 2013-12-05 DIAGNOSIS — I639 Cerebral infarction, unspecified: Secondary | ICD-10-CM

## 2013-12-05 DIAGNOSIS — E785 Hyperlipidemia, unspecified: Secondary | ICD-10-CM

## 2013-12-05 DIAGNOSIS — M109 Gout, unspecified: Secondary | ICD-10-CM

## 2013-12-05 DIAGNOSIS — Z951 Presence of aortocoronary bypass graft: Secondary | ICD-10-CM

## 2013-12-05 DIAGNOSIS — I6330 Cerebral infarction due to thrombosis of unspecified cerebral artery: Secondary | ICD-10-CM

## 2013-12-05 DIAGNOSIS — I10 Essential (primary) hypertension: Secondary | ICD-10-CM

## 2013-12-05 MED ORDER — FUROSEMIDE 20 MG PO TABS: ORAL_TABLET | ORAL | Status: DC

## 2013-12-05 MED ORDER — VALSARTAN 320 MG PO TABS: 320.0000 mg | ORAL_TABLET | Freq: Every day | ORAL | Status: DC

## 2013-12-05 NOTE — Patient Instructions (Signed)
Your physician has recommended you make the following change in your medication: stop the valsartan hct and start the new prescription for the plain valsartan. This has already been sent to the pharmacy. Start the furosemide as needed for swelling.  Your physician recommends that you schedule a follow-up appointment in: 6 months

## 2013-12-07 ENCOUNTER — Encounter: Payer: Self-pay | Admitting: Cardiovascular Disease

## 2013-12-07 DIAGNOSIS — I480 Paroxysmal atrial fibrillation: Secondary | ICD-10-CM | POA: Insufficient documentation

## 2013-12-07 DIAGNOSIS — M109 Gout, unspecified: Secondary | ICD-10-CM | POA: Insufficient documentation

## 2013-12-07 DIAGNOSIS — I4891 Unspecified atrial fibrillation: Secondary | ICD-10-CM | POA: Insufficient documentation

## 2013-12-07 NOTE — Progress Notes (Signed)
Patient ID: CREEK GAN, male   DOB: [birth year], 73 y.o.   MRN: [id number]      HPI: Shane Espinoza is a 73 y.o. male who presents to the office for six-month cardiology evaluation.  Shane Espinoza underwent CABG revascularization surgery on 03/07/2011 after a nuclear perfusion study revealed significant scar/ischemia and cardiac catheterization demonstrated severe multivessel CAD. CABG surgery was done by Dr. Nils Pyle on 03/07/2011 with LIMA to the LAD, vein to the OM, vein to the RV marginal, and vein to the PDA. He did develop PAF postoperatively and underwent TEE guided cardioversion with restoration of sinus rhythm. He completed cardiac rehabilitation. A nuclear perfusion study in August 2013 was markedly improved did not reveal any region of scar or ischemia. He has been off amiodarone and maintaining sinus rhythm. Additional problems include hypertension, hyperlipidemia, and in the past he's had issues with leg swelling.  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  An MRI of his brain showed a tiny left operculum, hemorrhage, infarct, is concerned that potentially this was thrombotic.  He did not have any loss of function of the upper or lower extremities and denied any headache, dizziness, visual symptoms, or difficulty swallowing.  His ECG at that time did suggest sinus rhythm.  He now is on anticoagulation with Xarelto 20 mg. His head CT was negative as was his MRA of the large and medium size vessels.  His carotids were widely patent.  He denies chest pain.  He denies any significant neurologic sequelae.  Last saw him, he has had 2 episodes of gout over the past month.  He sees Dr. Mertha Finders for primary care.  He presents for evaluation.  Past Medical History  Diagnosis Date  . Hypertension   . Anxiety   . 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 Atrial fibrillation   . CAD (coronary artery disease)     Shane Espinoza  . Gout      Past Surgical History  Procedure Laterality Date  . Coronary artery bypass graft  03/07/2011    DR.VAN  Espinoza  . Eye surgery    . Colon surgery    . Vascular doppler  03/03/2011    Nosignificant extracranial carotid artery stenosis  . Cardiac catheterization  03/02/2011    Recommended CABG  . Lexiscan myoview  02/20/2012    EKG negative for ischemia, noraml study  . Transthoracic echocardiogram  02/20/2012    EF 50-55%, mild-moderate concentric LVH, LA moderately dilated, moderate mitral regurg    No Known Allergies  Current Outpatient Prescriptions  Medication Sig Dispense Refill  . amLODipine (NORVASC) 5 MG tablet Take 1 tablet (5 mg total) by mouth daily.  90 tablet  3  . beta carotene w/minerals (OCUVITE) tablet Take 1 tablet by mouth daily.        . cholecalciferol (VITAMIN D) 1000 UNITS tablet Take 5,000 Units by mouth 3 (three) times a week. Monday, Wednesday, friday      . fish oil-omega-3 fatty acids 1000 MG capsule Take 1,200 mg by mouth daily.        . metoprolol tartrate (LOPRESSOR) 25 MG tablet Take 0.5 tablets (12.5 mg total) by mouth 2 (two) times daily.  90 tablet  3  . Rivaroxaban (XARELTO) 20 MG TABS tablet Take 20 mg by mouth daily with supper.      . simvastatin (ZOCOR) 20 MG tablet Take 1 tablet (20 mg total) by mouth  at bedtime.  90 tablet  3  . furosemide (LASIX) 20 MG tablet Take as needed for edema  30 tablet  3  . valsartan (DIOVAN) 320 MG tablet Take 1 tablet (320 mg total) by mouth daily.  90 tablet  3  . [DISCONTINUED] amiodarone (PACERONE) 200 MG tablet Take 200 mg by mouth daily.        No current facility-administered medications for this visit.    History   Social History  . Marital Status: Married    Spouse Name: N/A    Number of Children: 2  . Years of Education: college   Occupational History  . retitred    Social History Main Topics  . Smoking status: Never Smoker   . Smokeless tobacco: Never Used  . Alcohol Use: No  . Drug Use:  No  . Sexual Activity: No   Other Topics Concern  . Not on file   Social History Narrative  . No narrative on file    Family History  Problem Relation Age of Onset  . CVA Father   . Heart attack Maternal Grandmother   . Heart attack Maternal Grandfather   . Heart murmur Sister   . Hypertension Son    Socially he is married has 2 children 4 grandchildren. He now exercises fairly regularly. There is no tobacco or alcohol use.   ROS General: Negative; No fevers, chills, or night sweats;  HEENT: Negative; No changes in vision or hearing, sinus congestion, difficulty swallowing Pulmonary: Negative; No cough, wheezing, shortness of breath, hemoptysis Cardiovascular: Negative; No chest pain, presyncope, syncope, palpatations GI: Positive for hemorrhoids and he status post hemorrhoidal banding; No nausea, vomiting, diarrhea, or abdominal pain GU: Negative; No dysuria, hematuria, or difficulty voiding Musculoskeletal: Positive for gout;  no myalgias,  or weakness Hematologic/Oncology: Negative; no easy bruising, bleeding Endocrine: Negative; no heat/cold intolerance; no diabetes Neuro: Positive for thrombotic stroke; no changes in balance, headaches Skin: Negative; No rashes or skin lesions Psychiatric: Negative; No behavioral problems, depression Sleep: Negative; No snoring, daytime sleepiness, hypersomnolence, bruxism, restless legs, hypnogognic hallucinations, no cataplexy Other comprehensive 14 point system review is negative.   PE BP 108/62  Pulse 74  Ht 5\' 9"  (1.753 m)  Wt 204 lb 9.6 oz (92.806 kg)  BMI 30.20 kg/m2  General: Alert, oriented, no distress.  Skin: normal turgor, no rashes HEENT: Normocephalic, atraumatic. Pupils round and reactive; sclera anicteric;no lid lag.  Nose without nasal septal hypertrophy Mouth/Parynx benign; Mallinpatti scale 3 Neck: No JVD, no carotid bruits Lungs: clear to ausculatation and percussion; no wheezing or rales Chest wall:  Nontender to palpation Heart: Irregularly irregular rhythm with a controlled ventricular response in the 70s. s1 s2 normal  1/6 sem Abdomen: Mild central adiposity ; soft, nontender; no hepatosplenomehaly, BS+; abdominal aorta nontender and not dilated by palpation. Pulses 2+ Back: No CVA tenderness Extremities: no clubbing cyanosis or edema, Homan's sign negative  Neurologic: grossly nonfocal Psychologic: Normal affect and mood  ECG (independently read by me): Atrial fibrillation with a ventricular rate is 74.  Nonspecific ST changes.  10/09/2013 ECG (independently read by me): Atrial fibrillation with controlled ventricular response at 63 beats per minute.  Prior April 16 2013 ECG: Sinus bradycardia at 52 beats per minute.  LABS:  BMET    Component Value Date/Time   NA 144 09/24/2013 0403   K 3.9 09/24/2013 0403   CL 103 09/24/2013 0403   CO2 26 09/24/2013 0403   GLUCOSE 101* 09/24/2013 0403  BUN 15 09/24/2013 0403   CREATININE 0.80 09/24/2013 0403   CALCIUM 9.2 09/24/2013 0403   GFRNONAA 87* 09/24/2013 0403   GFRAA >90 09/24/2013 0403     Hepatic Function Panel     Component Value Date/Time   PROT 6.7 09/24/2013 0403   ALBUMIN 3.3* 09/24/2013 0403   AST 25 09/24/2013 0403   ALT 22 09/24/2013 0403   ALKPHOS 54 09/24/2013 0403   BILITOT 0.7 09/24/2013 0403     CBC    Component Value Date/Time   WBC 9.8 09/24/2013 0403   RBC 4.81 09/24/2013 0403   HGB 14.7 09/24/2013 0403   HCT 43.2 09/24/2013 0403   PLT 203 09/24/2013 0403   MCV 89.8 09/24/2013 0403   MCH 30.6 09/24/2013 0403   MCHC 34.0 09/24/2013 0403   RDW 13.5 09/24/2013 0403   LYMPHSABS 3.9 09/24/2013 0403   MONOABS 1.2* 09/24/2013 0403   EOSABS 0.5 09/24/2013 0403   BASOSABS 0.0 09/24/2013 0403     BNP No results found for this basename: probnp    Lipid Panel     Component Value Date/Time   CHOL 112 09/24/2013 0403     RADIOLOGY: No results found.    ASSESSMENT AND PLAN: Shane Espinoza is 2 1/2 years  status post CABG revascularization surgery for severe multivessel CAD.  He suffered a small thrombotic CVA most likely due to atrial fibrillation, etiology. His ECG today demonstrates atrial fibrillation with a controlled ventricular response for which he is entirely asymptomatic.  He is now on Xarelto anticoagulation therapy.  He is unaware of his heart rate being irregular.  He denies awareness of presyncope were tachypalpitations.  His blood pressure today is controlled on current therapy.  Since I last saw him, he has had several episodes of gout.  He has been on valsartan and hydrochlorothiazide combination.  His blood pressure today is stable.  I will discontinue the Diovan HCT, and in its place.  We'll just start valsartan 320 mg without the fluid pill.  He will be seeing Dr. Inda Merlin.  He may need to start allopurinol, but I will defer this to Dr. Inda Merlin.  I've also given him a prescription for Lasix to take 20 mg on an as-needed basis.  If edema occurs off his thiazide diuretic.  I will see him in 6 months for cardiology reevaluation.  Troy Sine, MD, [facility]  12/07/2013 9:32 PM

## 2014-04-17 ENCOUNTER — Other Ambulatory Visit: Payer: Self-pay

## 2014-05-18 ENCOUNTER — Other Ambulatory Visit: Payer: Self-pay | Admitting: Internal Medicine

## 2014-05-18 ENCOUNTER — Ambulatory Visit
Admission: RE | Admit: 2014-05-18 | Discharge: 2014-05-18 | Disposition: A | Discharge status: Home / Self Care | Payer: Medicare Other | Source: Ambulatory Visit | Attending: Internal Medicine | Admitting: Internal Medicine

## 2014-05-18 DIAGNOSIS — R071 Chest pain on breathing: Secondary | ICD-10-CM

## 2014-06-01 ENCOUNTER — Other Ambulatory Visit: Payer: Self-pay | Admitting: Cardiovascular Disease

## 2014-06-02 NOTE — Telephone Encounter (Signed)
Rx was sent to pharmacy electronically. OV 06/03/2014

## 2014-06-03 ENCOUNTER — Ambulatory Visit (INDEPENDENT_AMBULATORY_CARE_PROVIDER_SITE_OTHER): Payer: Medicare Other | Admitting: Cardiovascular Disease

## 2014-06-03 ENCOUNTER — Encounter: Payer: Self-pay | Admitting: Cardiovascular Disease

## 2014-06-03 VITALS — BP 110/70 | HR 84 | Ht 68.5 in | Wt 199.7 lb

## 2014-06-03 DIAGNOSIS — I10 Essential (primary) hypertension: Secondary | ICD-10-CM

## 2014-06-03 DIAGNOSIS — E785 Hyperlipidemia, unspecified: Secondary | ICD-10-CM

## 2014-06-03 DIAGNOSIS — Z951 Presence of aortocoronary bypass graft: Secondary | ICD-10-CM

## 2014-06-03 DIAGNOSIS — I480 Paroxysmal atrial fibrillation: Secondary | ICD-10-CM

## 2014-06-03 DIAGNOSIS — M1009 Idiopathic gout, multiple sites: Secondary | ICD-10-CM

## 2014-06-03 DIAGNOSIS — I2510 Atherosclerotic heart disease of native coronary artery without angina pectoris: Secondary | ICD-10-CM

## 2014-06-03 MED ORDER — METOPROLOL TARTRATE 25 MG PO TABS: ORAL_TABLET | ORAL | Status: DC

## 2014-06-03 NOTE — Patient Instructions (Signed)
Your physician has recommended you make the following change in your medication: change the lopressor to 1 tablet in the morning and 1/2 tablet @ night.   Your physician wants you to follow-up in: 6 months or sooner if needed. You will receive a reminder letter in the mail two months in advance. If you don't receive a letter, please call our office to schedule the follow-up appointment.

## 2014-06-03 NOTE — Progress Notes (Signed)
Patient ID: Shane Espinoza, male   DOB: 03/26/1941, 73 y.o.   MRN: [id number]      HPI: Shane Espinoza is a 73 y.o. male who presents to the office for six-month cardiology evaluation.  [name] underwent CABG revascularization surgery on 03/07/2011 after a nuclear perfusion study revealed significant scar/ischemia and cardiac catheterization demonstrated severe multivessel CAD. CABG surgery was done by Dr. Nils Pyle on 03/07/2011 with LIMA to the LAD, vein to the OM, vein to the RV marginal, and vein to the PDA. He did develop PAF postoperatively and underwent TEE guided cardioversion with restoration of sinus rhythm. He completed cardiac rehabilitation. A nuclear perfusion study in August 2013 was markedly improved did not reveal any region of scar or ischemia. He has been off amiodarone and maintaining sinus rhythm. Additional problems include hypertension, hyperlipidemia, and in the past he's had issues with leg swelling.  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  An MRI of his brain showed a tiny left operculum, hemorrhage, infarct, is concerned that potentially this was thrombotic.  He did not have any loss of function of the upper or lower extremities and denied any headache, dizziness, visual symptoms, or difficulty swallowing.  His ECG at that time did suggest sinus rhythm.  He now is on anticoagulation with Xarelto 20 mg. His head CT was negative as was his MRA of the large and medium size vessels.  His carotids were widely patent.  He denies chest pain.  He denies any significant neurologic sequelae.  He is now in permanent atrial fibrillation on chronic anticoagulation with Xarelto.  He has had episodes of recurrent gout.  When I had last seen him, I taken him off HCTZ since this could be contributing to elevation of uric acid.  He is now on low-dose chronic alliupurinol.  He tells me probably 3 weeks ago he had a fall on his back.  He  underwent chest x-ray which did not show any rib fracture.  He did not have any significant hematoma.  He denies recent chest pain.  He is unaware that his heart rate is irregular.  He walks on a treadmill approximately 2-3 times per week at the gym.  At times his heart rate does speed up.  He denies any presyncope or syncope.  He has been on valsartan 320 mg daily in addition to Lopressor 12.5 twice a day and amlodipine 5 mg for blood pressure.  He takes furosemide rather than HCTZ.  In the event he has leg edema.  Past Medical History  Diagnosis Date  . Hypertension   . Anxiety   . 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 Atrial fibrillation   . CAD (coronary artery disease)     [name]  . Gout     Past Surgical History  Procedure Laterality Date  . Coronary artery bypass graft  03/07/2011    DR.VAN  TRIGHT  . Eye surgery    . Colon surgery    . Vascular doppler  03/03/2011    Nosignificant extracranial carotid artery stenosis  . Cardiac catheterization  03/02/2011    Recommended CABG  . Lexiscan myoview  02/20/2012    EKG negative for ischemia, noraml study  . Transthoracic echocardiogram  02/20/2012    EF 50-55%, mild-moderate concentric LVH, LA moderately dilated, moderate mitral regurg    No Known Allergies  Current Outpatient Prescriptions  Medication Sig Dispense Refill  . allopurinol (ZYLOPRIM)  100 MG tablet Take 1 tablet by mouth daily.  2  . amLODipine (NORVASC) 5 MG tablet Take 1 tablet (5 mg total) by mouth daily. 90 tablet 1  . beta carotene w/minerals (OCUVITE) tablet Take 1 tablet by mouth daily.      . cholecalciferol (VITAMIN D) 1000 UNITS tablet Take 5,000 Units by mouth 3 (three) times a week. Monday, Wednesday, friday    . fish oil-omega-3 fatty acids 1000 MG capsule Take 1,200 mg by mouth daily.      . furosemide (LASIX) 20 MG tablet Take as needed for edema 30 tablet 3  . metoprolol tartrate (LOPRESSOR) 25 MG tablet Take 0.5 tablets (12.5 mg total) by  mouth 2 (two) times daily. 90 tablet 3  . Rivaroxaban (XARELTO) 20 MG TABS tablet Take 20 mg by mouth daily with supper.    . simvastatin (ZOCOR) 20 MG tablet Take 1 tablet (20 mg total) by mouth at bedtime. 90 tablet 3  . valsartan (DIOVAN) 320 MG tablet Take 1 tablet (320 mg total) by mouth daily. 90 tablet 3  . [DISCONTINUED] amiodarone (PACERONE) 200 MG tablet Take 200 mg by mouth daily.      No current facility-administered medications for this visit.    History   Social History  . Marital Status: Married    Spouse Name: N/A    Number of Children: 2  . Years of Education: college   Occupational History  . retitred    Social History Main Topics  . Smoking status: Never Smoker   . Smokeless tobacco: Never Used  . Alcohol Use: No  . Drug Use: No  . Sexual Activity: No   Other Topics Concern  . Not on file   Social History Narrative    Family History  Problem Relation Age of Onset  . CVA Father   . Heart attack Maternal Grandmother   . Heart attack Maternal Grandfather   . Heart murmur Sister   . Hypertension Son    Socially he is married has 2 children 4 grandchildren. He now exercises fairly regularly. There is no tobacco or alcohol use.   ROS General: Negative; No fevers, chills, or night sweats;  HEENT: Negative; No changes in vision or hearing, sinus congestion, difficulty swallowing Pulmonary: Negative; No cough, wheezing, shortness of breath, hemoptysis Cardiovascular: Negative; No chest pain, presyncope, syncope, palpatations GI: Positive for hemorrhoids and he status post hemorrhoidal banding; No nausea, vomiting, diarrhea, or abdominal pain GU: Negative; No dysuria, hematuria, or difficulty voiding Musculoskeletal: Positive for gout;  no myalgias,  or weakness Hematologic/Oncology: Negative; no easy bruising, bleeding Endocrine: Negative; no heat/cold intolerance; no diabetes Neuro: Positive for thrombotic stroke; no changes in balance,  headaches Skin: Negative; No rashes or skin lesions Psychiatric: Negative; No behavioral problems, depression Sleep: Negative; No snoring, daytime sleepiness, hypersomnolence, bruxism, restless legs, hypnogognic hallucinations, no cataplexy Other comprehensive 14 point system review is negative.   PE BP 110/70 mmHg  Pulse 84  Ht 5' 8.5" (1.74 m)  Wt 199 lb 11.2 oz (90.583 kg)  BMI 29.92 kg/m2  General: Alert, oriented, no distress.  Skin: normal turgor, no rashes HEENT: Normocephalic, atraumatic. Pupils round and reactive; sclera anicteric;no lid lag.  Nose without nasal septal hypertrophy Mouth/Parynx benign; Mallinpatti scale 3 Neck: No JVD, no carotid bruits Lungs: clear to ausculatation and percussion; no wheezing or rales Chest wall: Nontender to palpation Heart: Irregularly irregular rhythm with a controlled ventricular response in the 70s. s1 s2 normal  1/6 sem Abdomen:  Mild central adiposity ; soft, nontender; no hepatosplenomehaly, BS+; abdominal aorta nontender and not dilated by palpation. Pulses 2+ Back: No CVA tenderness Extremities: no clubbing cyanosis or edema, Homan's sign negative  Neurologic: grossly nonfocal Psychologic: Normal affect and mood  ECG (independently read by me): Atrial fibrillation with a ventricular rate at 84.  No significant ST-T changes  June 2015 ECG (independently read by me): Atrial fibrillation with a ventricular rate is 74.  Nonspecific ST changes.  10/09/2013 ECG (independently read by me): Atrial fibrillation with controlled ventricular response at 63 beats per minute.  Prior April 16 2013 ECG: Sinus bradycardia at 52 beats per minute.  LABS:  BMET    Component Value Date/Time   NA 144 09/24/2013 0403   K 3.9 09/24/2013 0403   CL 103 09/24/2013 0403   CO2 26 09/24/2013 0403   GLUCOSE 101* 09/24/2013 0403   BUN 15 09/24/2013 0403   CREATININE 0.80 09/24/2013 0403   CALCIUM 9.2 09/24/2013 0403   GFRNONAA 87* 09/24/2013  0403   GFRAA >90 09/24/2013 0403     Hepatic Function Panel     Component Value Date/Time   PROT 6.7 09/24/2013 0403   ALBUMIN 3.3* 09/24/2013 0403   AST 25 09/24/2013 0403   ALT 22 09/24/2013 0403   ALKPHOS 54 09/24/2013 0403   BILITOT 0.7 09/24/2013 0403     CBC    Component Value Date/Time   WBC 9.8 09/24/2013 0403   RBC 4.81 09/24/2013 0403   HGB 14.7 09/24/2013 0403   HCT 43.2 09/24/2013 0403   PLT 203 09/24/2013 0403   MCV 89.8 09/24/2013 0403   MCH 30.6 09/24/2013 0403   MCHC 34.0 09/24/2013 0403   RDW 13.5 09/24/2013 0403   LYMPHSABS 3.9 09/24/2013 0403   MONOABS 1.2* 09/24/2013 0403   EOSABS 0.5 09/24/2013 0403   BASOSABS 0.0 09/24/2013 0403     BNP No results found for: PROBNP   Lipid Panel     Component Value Date/Time   CHOL 112 09/24/2013 0403   TRIG 112 09/24/2013 0403   HDL 38* 09/24/2013 0403   CHOLHDL 2.9 09/24/2013 0403   VLDL 22 09/24/2013 0403   LDLCALC 52 09/24/2013 0403      RADIOLOGY: No results found.    ASSESSMENT AND PLAN: Mr. Bastos is a 73 year old white male who is over 3 years status post CABG revascularization surgery for severe multivessel CAD.  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  He is now in permanent atrial fibrillation with a controlled ventricular response for which he is entirely asymptomatic.  He is now on Xarelto anticoagulation therapy.  He is unaware of his heart rate being irregular. Marland Kitchen  His resting pulse is in the mid 80s.  There have been some instances where his pulse has increased to a high level on his treadmill.  For this reason, I am recommending that he increase the Lopressor to 25 mg in the morning but he will continue to take the 12.5 mg at night.   His blood pressure today is controlled on current therapy.  Since I last saw him, he has had several episodes of gout.  At his last office visit, I  discontinued the HCT portion of his valsartan.  He only takes furosemide on an as-needed basis for leg edema, which is rare.  He now is on chronic allopurinol therapy, but at a low dose of  only 100 mg.  He is anticoagulated on Xarelto 20 mg.  There is no bleeding.  He is on simvastatin 20 mg as well as fish oil for hyperlipidemia.  Lipid studies in March 2015 were excellent with a total cholesterol of 112 and LDL cholesterol of 52.  He tells me Dr. Inda Merlin recently checked a CBC, chemistry profile and uric acid level.  I will obtain these results.  I commended him on his 5 pound weight loss from his last office visit.  He now is under the obesity threshold with a BMI of 29.9.  Further weight loss was recommended.  We also have recommended that he increase the frequency of his exercise to at least 4-5 times per week if possible.  I will see him in 6 months for cardiology reevaluation or sooner if problems arise.    Troy Sine, MD, [facility]  06/03/2014 8:04 AM

## 2014-06-17 ENCOUNTER — Ambulatory Visit (INDEPENDENT_AMBULATORY_CARE_PROVIDER_SITE_OTHER): Payer: Medicare Other | Admitting: Ophthalmology

## 2014-06-17 DIAGNOSIS — H35372 Puckering of macula, left eye: Secondary | ICD-10-CM

## 2014-06-17 DIAGNOSIS — H35341 Macular cyst, hole, or pseudohole, right eye: Secondary | ICD-10-CM

## 2014-06-17 DIAGNOSIS — I10 Essential (primary) hypertension: Secondary | ICD-10-CM

## 2014-06-17 DIAGNOSIS — H43812 Vitreous degeneration, left eye: Secondary | ICD-10-CM

## 2014-06-17 DIAGNOSIS — H35033 Hypertensive retinopathy, bilateral: Secondary | ICD-10-CM

## 2014-07-07 ENCOUNTER — Other Ambulatory Visit: Payer: Self-pay | Admitting: Cardiovascular Disease

## 2014-07-07 NOTE — Telephone Encounter (Signed)
Rx(s) sent to pharmacy electronically.  

## 2014-07-16 ENCOUNTER — Telehealth: Payer: Self-pay | Admitting: Cardiovascular Disease

## 2014-07-16 ENCOUNTER — Other Ambulatory Visit: Payer: Self-pay | Admitting: *Deleted

## 2014-07-16 MED ORDER — METOPROLOL TARTRATE 25 MG PO TABS: ORAL_TABLET | ORAL | Status: DC

## 2014-07-16 MED ORDER — RIVAROXABAN 20 MG PO TABS: 20.0000 mg | ORAL_TABLET | Freq: Every day | ORAL | Status: DC

## 2014-07-16 MED ORDER — SIMVASTATIN 20 MG PO TABS: 20.0000 mg | ORAL_TABLET | Freq: Every day | ORAL | Status: DC

## 2014-07-16 MED ORDER — FUROSEMIDE 20 MG PO TABS: ORAL_TABLET | ORAL | Status: DC

## 2014-07-16 MED ORDER — AMLODIPINE BESYLATE 5 MG PO TABS: 5.0000 mg | ORAL_TABLET | Freq: Every day | ORAL | Status: DC

## 2014-07-16 MED ORDER — VALSARTAN 320 MG PO TABS: 320.0000 mg | ORAL_TABLET | Freq: Every day | ORAL | Status: DC

## 2014-07-16 NOTE — Telephone Encounter (Signed)
RX sent to patients pharmacy

## 2014-07-16 NOTE — Telephone Encounter (Signed)
Shane Espinoza is calling because his medications were sent to the wrong mail order . They are  Now with [facility] and not [facility] . Please call   Thanks

## 2014-07-16 NOTE — Telephone Encounter (Signed)
Submitted requested refills to [facility]. Pt voiced understanding, I told pt that [facility] will pop up as default for future refills.

## 2014-07-20 ENCOUNTER — Other Ambulatory Visit: Payer: Self-pay

## 2014-07-20 MED ORDER — AMLODIPINE BESYLATE 5 MG PO TABS: 5.0000 mg | ORAL_TABLET | Freq: Every day | ORAL | Status: DC

## 2014-07-20 MED ORDER — VALSARTAN 320 MG PO TABS: 320.0000 mg | ORAL_TABLET | Freq: Every day | ORAL | Status: DC

## 2014-07-20 MED ORDER — FUROSEMIDE 20 MG PO TABS: ORAL_TABLET | ORAL | Status: DC

## 2014-07-20 NOTE — Telephone Encounter (Signed)
Rx sent to pharmacy   

## 2014-07-23 ENCOUNTER — Other Ambulatory Visit: Payer: Self-pay | Admitting: Cardiovascular Disease

## 2014-07-23 MED ORDER — RIVAROXABAN 20 MG PO TABS: 20.0000 mg | ORAL_TABLET | Freq: Every day | ORAL | Status: DC

## 2014-07-23 NOTE — Telephone Encounter (Signed)
Phoned [facility] and got approval for patient's xarelto.  Representative states to me the approval is valid until 1/17.

## 2014-07-23 NOTE — Telephone Encounter (Signed)
Pt's wife called in stating that a prior authorization is needed to refill pt's Xarelto prescription. [name] says that a reason is needed from the physician as to why he should continue to take this medication(which she felt was neccessary and ridiculous). She provided me with the order number which is [id number]. Please follow up.  * [name] was also requesting some samples until the pt is able to get is medication from [facility].  Thanks

## 2014-07-23 NOTE — Telephone Encounter (Signed)
OPEN ERROR

## 2014-07-23 NOTE — Telephone Encounter (Signed)
NO SAMPLES AVAILABLE FORWARD TO WANDA WADDELL CMA

## 2014-12-01 ENCOUNTER — Encounter: Payer: Self-pay | Admitting: Cardiovascular Disease

## 2014-12-01 ENCOUNTER — Ambulatory Visit (INDEPENDENT_AMBULATORY_CARE_PROVIDER_SITE_OTHER): Payer: Medicare Other | Admitting: Cardiovascular Disease

## 2014-12-01 VITALS — BP 132/80 | Ht 69.0 in | Wt 207.3 lb

## 2014-12-01 DIAGNOSIS — I2510 Atherosclerotic heart disease of native coronary artery without angina pectoris: Secondary | ICD-10-CM

## 2014-12-01 DIAGNOSIS — I480 Paroxysmal atrial fibrillation: Secondary | ICD-10-CM | POA: Diagnosis not present

## 2014-12-01 DIAGNOSIS — E785 Hyperlipidemia, unspecified: Secondary | ICD-10-CM | POA: Diagnosis not present

## 2014-12-01 DIAGNOSIS — Z951 Presence of aortocoronary bypass graft: Secondary | ICD-10-CM

## 2014-12-01 DIAGNOSIS — I10 Essential (primary) hypertension: Secondary | ICD-10-CM

## 2014-12-01 MED ORDER — FUROSEMIDE 20 MG PO TABS: ORAL_TABLET | ORAL | Status: DC

## 2014-12-01 NOTE — Patient Instructions (Signed)
Your physician recommends that you return for lab work fasting.  Your physician has recommended you make the following change in your medication: take furosemide daily for (3) three days , then every other day.  Your physician wants you to follow-up in: 6 months or sooner if needed. You will receive a reminder letter in the mail two months in advance. If you don't receive a letter, please call our office to schedule the follow-up appointment.

## 2014-12-01 NOTE — Progress Notes (Signed)
Patient ID: Shane Espinoza, male   DOB: 03/02/1941, 74 y.o.   MRN: [id number]      HPI: Shane Espinoza is a 74 y.o. male who presents to the office for six-month cardiology evaluation.  Shane Espinoza has established CAD andunderwent CABG revascularization surgery by Dr. Nils Pyle on 03/07/2011 after a nuclear perfusion study revealed significant scar/ischemia and cardiac catheterization demonstrated severe multivessel CAD. He had aLIMA to the LAD, vein to the OM, vein to the RV marginal, and vein to the PDA. He  developed PAF postoperatively and underwent TEE guided cardioversion with restoration of sinus rhythm. He completed cardiac rehabilitation. A nuclear perfusion study in August 2013 was markedly improved did not reveal any region of scar or ischemia. He has been off amiodarone and maintaining sinus rhythm. Additional problems include hypertension, hyperlipidemia, and in the past he's had issues with leg swelling.  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  An MRI of his brain showed a tiny left operculum, hemorrhage, infarct, is concerned that potentially this was thrombotic.  He did not have any loss of function of the upper or lower extremities and denied any headache, dizziness, visual symptoms, or difficulty swallowing.  His ECG at that time did suggest sinus rhythm.  He now is on anticoagulation with Xarelto 20 mg. His head CT was negative as was his MRA of the large and medium size vessels.  His carotids were widely patent.  He denies chest pain.  He denies any significant neurologic sequelae.  He is now in permanent atrial fibrillation on chronic anticoagulation with Xarelto.  He has had episodes of recurrent gout.  I had taken him off HCTZ since this could be contributing to elevation of uric acid.  He is now on low-dose chronic alliupurinol.    He denies recent chest pain.  He is unaware that his heart rate is irregular.  He walks on a  treadmill approximately 2-3 times per week at the gym.  At times his heart rate does speed up.  He denies any presyncope or syncope.  He has been on valsartan 320 mg daily in addition to Lopressor 12.5 twice a day and amlodipine 5 mg for blood pressure.  He takes furosemide rather than HCTZ 4.  Intermittent leg edema and has been taking this on an as-needed basis.  2 weeks ago, however, he noted that his left foot was swelling more.  He saw his primary physician.  He took Lasix for several days with resolution.  He presents for evaluation.  Past Medical History  Diagnosis Date  . Hypertension   . Anxiety   . 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 Atrial fibrillation   . CAD (coronary artery disease)     Shane Espinoza  . Gout     Past Surgical History  Procedure Laterality Date  . Coronary artery bypass graft  03/07/2011    DR.VAN  Espinoza  . Eye surgery    . Colon surgery    . Vascular doppler  03/03/2011    Nosignificant extracranial carotid artery stenosis  . Cardiac catheterization  03/02/2011    Recommended CABG  . Lexiscan myoview  02/20/2012    EKG negative for ischemia, noraml study  . Transthoracic echocardiogram  02/20/2012    EF 50-55%, mild-moderate concentric LVH, LA moderately dilated, moderate mitral regurg    No Known Allergies  Current Outpatient Prescriptions  Medication Sig Dispense Refill  . allopurinol (ZYLOPRIM)  100 MG tablet Take 1 tablet by mouth daily.  2  . amLODipine (NORVASC) 5 MG tablet Take 1 tablet (5 mg total) by mouth daily. 90 tablet 3  . beta carotene w/minerals (OCUVITE) tablet Take 1 tablet by mouth daily.      . Cholecalciferol (VITAMIN D3) 5000 UNITS CAPS Take by mouth. Monday, Wednesday, Friday    . colchicine 0.6 MG tablet Take 0.6 mg by mouth as needed.    . fish oil-omega-3 fatty acids 1000 MG capsule Take 1,200 mg by mouth daily.      . furosemide (LASIX) 20 MG tablet Every other day 90 tablet 3  . metoprolol tartrate (LOPRESSOR) 25 MG  tablet Take 1 tablet in the morning and 1/2 tablet  At night. 135 tablet 3  . rivaroxaban (XARELTO) 20 MG TABS tablet Take 1 tablet (20 mg total) by mouth daily with supper. 90 tablet 3  . simvastatin (ZOCOR) 20 MG tablet Take 1 tablet (20 mg total) by mouth at bedtime. 90 tablet 3  . valsartan (DIOVAN) 320 MG tablet Take 1 tablet (320 mg total) by mouth daily. 90 tablet 3  . [DISCONTINUED] amiodarone (PACERONE) 200 MG tablet Take 200 mg by mouth daily.      No current facility-administered medications for this visit.    History   Social History  . Marital Status: Married    Spouse Name: N/A  . Number of Children: 2  . Years of Education: college   Occupational History  . retitred    Social History Main Topics  . Smoking status: Never Smoker   . Smokeless tobacco: Never Used  . Alcohol Use: No  . Drug Use: No  . Sexual Activity: No   Other Topics Concern  . Not on file   Social History Narrative    Family History  Problem Relation Age of Onset  . CVA Father   . Heart attack Maternal Grandmother   . Heart attack Maternal Grandfather   . Heart murmur Sister   . Hypertension Son    Socially he is married has 2 children 4 grandchildren. He now exercises fairly regularly. There is no tobacco or alcohol use.   ROS General: Negative; No fevers, chills, or night sweats;  HEENT: Negative; No changes in vision or hearing, sinus congestion, difficulty swallowing Pulmonary: Negative; No cough, wheezing, shortness of breath, hemoptysis Cardiovascular: see history of present illness GI: Positive for hemorrhoids and he status post hemorrhoidal banding; No nausea, vomiting, diarrhea, or abdominal pain GU: Negative; No dysuria, hematuria, or difficulty voiding Musculoskeletal: Positive for gout;  no myalgias,  or weakness Hematologic/Oncology: Negative; no easy bruising, bleeding Endocrine: Negative; no heat/cold intolerance; no diabetes Neuro: Positive for thrombotic stroke; no  changes in balance, headaches Skin: Negative; No rashes or skin lesions Psychiatric: Negative; No behavioral problems, depression Sleep: Negative; No snoring, daytime sleepiness, hypersomnolence, bruxism, restless legs, hypnogognic hallucinations, no cataplexy Other comprehensive 14 point system review is negative.   PE BP 132/80 mmHg  Ht 5' 9"  (1.753 m)  Wt 207 lb 4.8 oz (94.031 kg)  BMI 30.60 kg/m2  Wt Readings from Last 3 Encounters:  12/01/14 207 lb 4.8 oz (94.031 kg)  06/03/14 199 lb 11.2 oz (90.583 kg)  12/05/13 204 lb 9.6 oz (92.806 kg)   General: Alert, oriented, no distress.  Skin: normal turgor, no rashes HEENT: Normocephalic, atraumatic. Pupils round and reactive; sclera anicteric;no lid lag.  Nose without nasal septal hypertrophy Mouth/Parynx benign; Mallinpatti scale 3 Neck: No JVD, no  carotid bruits Lungs: clear to ausculatation and percussion; no wheezing or rales Chest wall: Nontender to palpation Heart: Irregularly irregular rhythm with a controlled ventricular response in the 70s. s1 s2 normal  1/6 sem Abdomen: Mild central adiposity ; soft, nontender; no hepatosplenomehaly, BS+; abdominal aorta nontender and not dilated by palpation. Pulses 2+ Back: No CVA tenderness Extremities: no clubbing cyanosis or edema, Homan's sign negative  Neurologic: grossly nonfocal Psychologic: Normal affect and mood  ECG (independently read by me): Atrial fibrillation with a controlled ventricular rate in the 60s.  December 2015 ECG (independently read by me): Atrial fibrillation with a ventricular rate at 84.  No significant ST-T changes  June 2015 ECG (independently read by me): Atrial fibrillation with a ventricular rate is 74.  Nonspecific ST changes.  10/09/2013 ECG (independently read by me): Atrial fibrillation with controlled ventricular response at 63 beats per minute.  Prior April 16 2013 ECG: Sinus bradycardia at 52 beats per minute.  LABS: BMP Latest Ref Rng  09/24/2013 09/23/2013 09/23/2013  Glucose 70 - 99 mg/dL 101(H) 174(H) 165(H)  BUN 6 - 23 mg/dL 15 19 19   Creatinine 0.50 - 1.35 mg/dL 0.80 1.10 0.95  Sodium 137 - 147 mEq/L 144 144 143  Potassium 3.7 - 5.3 mEq/L 3.9 3.0(L) 3.2(L)  Chloride 96 - 112 mEq/L 103 103 101  CO2 19 - 32 mEq/L 26 - 24  Calcium 8.4 - 10.5 mg/dL 9.2 - 9.9   Hepatic Function Latest Ref Rng 09/24/2013 09/23/2013 03/09/2011  Total Protein 6.0 - 8.3 g/dL 6.7 7.2 -  Albumin 3.5 - 5.2 g/dL 3.3(L) 3.7 2.5(L)  AST 0 - 37 U/L 25 30 -  ALT 0 - 53 U/L 22 28 -  Alk Phosphatase 39 - 117 U/L 54 61 -  Total Bilirubin 0.3 - 1.2 mg/dL 0.7 0.6 -   CBC Latest Ref Rng 09/24/2013 09/23/2013 09/23/2013  WBC 4.0 - 10.5 K/uL 9.8 - 9.2  Hemoglobin 13.0 - 17.0 g/dL 14.7 16.3 15.7  Hematocrit 39.0 - 52.0 % 43.2 48.0 46.4  Platelets 150 - 400 K/uL 203 - 226   Lab Results  Component Value Date   MCV 89.8 09/24/2013   MCV 89.7 09/23/2013   MCV 88.0 11/13/2011   Lab Results  Component Value Date   TSH 2.456 09/24/2013   Lab Results  Component Value Date   HGBA1C 6.2* 09/24/2013   Lipid Panel     Component Value Date/Time   CHOL 112 09/24/2013 0403   TRIG 112 09/24/2013 0403   HDL 38* 09/24/2013 0403   CHOLHDL 2.9 09/24/2013 0403   VLDL 22 09/24/2013 0403   LDLCALC 52 09/24/2013 0403    ASSESSMENT AND PLAN: Shane Espinoza is a 74 year-old white male who is almost 4years status post CABG revascularization surgery for severe multivessel CAD.  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  He is now in permanent atrial fibrillation with a controlled ventricular response for which he is entirely asymptomatic.  He is now on Xarelto anticoagulation therapy. He denies any bleeding. He is unaware of his heart rate being irregular.  His resting pulse is in the mid 60's. When I last saw him, he admitted to at times having increased heart rate and he  has done well with his increased beta blocker dose, which now is 25 mg in the morning and 12.5 mg at night.  His gout is now well  controlled and he continues to take daily allopurinol and colchicine.  He's no longer on HCTZ.  He has lower extremity edema.  Presently, 1+ pitting particularly in the left ankle.  I have recommended that he take Lasix 20 mg daily for the next 3 days and he will change this then to 20 mg every other day. His blood pressure is well controlled on valsartan 3 or 20 mg in addition to his beta blocker diarrhetic and amlodipine 5 mg regimen.  He continues to take simvastatin at low-dose 20 mg particular with his concomitant amlodipine administration. Laboratory will be obtained in the fasting state including CBC, C-met, TSH, lipid panel and uric acid level.  I will see him in 6 months for cardiology evaluation.  Time spent: 25 minutes  Troy Sine, MD, [facility]  12/01/2014 9:01 PM

## 2014-12-08 LAB — CBC
HCT: 46.9 % (ref 39.0–52.0)
Hemoglobin: 15.4 g/dL (ref 13.0–17.0)
MCH: 28.9 pg (ref 26.0–34.0)
MCHC: 32.8 g/dL (ref 30.0–36.0)
MCV: 88.2 fL (ref 78.0–100.0)
MPV: 11.1 fL (ref 8.6–12.4)
Platelets: 208 10*3/uL (ref 150–400)
RBC: 5.32 MIL/uL (ref 4.22–5.81)
RDW: 14.4 % (ref 11.5–15.5)
WBC: 8 10*3/uL (ref 4.0–10.5)

## 2014-12-09 LAB — COMPREHENSIVE METABOLIC PANEL
ALK PHOS: 65 U/L (ref 39–117)
ALT: 19 U/L (ref 0–53)
AST: 24 U/L (ref 0–37)
Albumin: 4.1 g/dL (ref 3.5–5.2)
BUN: 11 mg/dL (ref 6–23)
CO2: 27 meq/L (ref 19–32)
Calcium: 9.4 mg/dL (ref 8.4–10.5)
Chloride: 103 mEq/L (ref 96–112)
Creat: 0.9 mg/dL (ref 0.50–1.35)
Glucose, Bld: 116 mg/dL — ABNORMAL HIGH (ref 70–99)
Potassium: 3.8 mEq/L (ref 3.5–5.3)
SODIUM: 142 meq/L (ref 135–145)
TOTAL PROTEIN: 7.1 g/dL (ref 6.0–8.3)
Total Bilirubin: 1.1 mg/dL (ref 0.2–1.2)

## 2014-12-09 LAB — LIPID PANEL
CHOL/HDL RATIO: 2.6 ratio
Cholesterol: 108 mg/dL (ref 0–200)
HDL: 41 mg/dL (ref >=40)
LDL CALC: 46 mg/dL (ref 0–99)
TRIGLYCERIDES: 106 mg/dL (ref <150)
VLDL: 21 mg/dL (ref 0–40)

## 2014-12-09 LAB — TSH: TSH: 2.031 u[IU]/mL (ref 0.350–4.500)

## 2014-12-28 ENCOUNTER — Other Ambulatory Visit: Payer: Self-pay

## 2015-04-24 ENCOUNTER — Other Ambulatory Visit: Payer: Self-pay | Admitting: Cardiovascular Disease

## 2015-05-26 ENCOUNTER — Ambulatory Visit (INDEPENDENT_AMBULATORY_CARE_PROVIDER_SITE_OTHER): Payer: Medicare Other | Admitting: Cardiovascular Disease

## 2015-05-26 ENCOUNTER — Encounter: Payer: Self-pay | Admitting: Cardiovascular Disease

## 2015-05-26 VITALS — BP 130/64 | HR 67 | Ht 69.0 in | Wt 203.1 lb

## 2015-05-26 DIAGNOSIS — I482 Chronic atrial fibrillation, unspecified: Secondary | ICD-10-CM

## 2015-05-26 DIAGNOSIS — I2583 Coronary atherosclerosis due to lipid rich plaque: Secondary | ICD-10-CM

## 2015-05-26 DIAGNOSIS — F419 Anxiety disorder, unspecified: Secondary | ICD-10-CM | POA: Diagnosis not present

## 2015-05-26 DIAGNOSIS — E785 Hyperlipidemia, unspecified: Secondary | ICD-10-CM | POA: Diagnosis not present

## 2015-05-26 DIAGNOSIS — I2510 Atherosclerotic heart disease of native coronary artery without angina pectoris: Secondary | ICD-10-CM | POA: Diagnosis not present

## 2015-05-26 DIAGNOSIS — I10 Essential (primary) hypertension: Secondary | ICD-10-CM | POA: Diagnosis not present

## 2015-05-26 NOTE — Patient Instructions (Signed)
Your physician wants you to follow-up in: 6 Months. You will receive a reminder letter in the mail two months in advance. If you don't receive a letter, please call our office to schedule the follow-up appointment.  If you need a refill on your cardiac medications before your next appointment, please call your pharmacy.       Happy Thanksgiving!!!

## 2015-05-26 NOTE — Progress Notes (Signed)
Patient ID: Shane Espinoza, male   DOB: 11/04/1940, 74 y.o.   MRN: [id number]      HPI: Shane Espinoza is a 74 y.o. male who presents to the office for six-month cardiology evaluation.  Mr. Cedano has established CAD and underwent CABG revascularization surgery by Dr. Nils Pyle on 03/07/2011 after a nuclear perfusion study revealed significant scar/ischemia and cardiac catheterization demonstrated severe multivessel CAD. He had aLIMA to the LAD, vein to the OM, vein to the RV marginal, and vein to the PDA. He  developed PAF postoperatively and underwent TEE guided cardioversion with restoration of sinus rhythm. He completed cardiac rehabilitation. A nuclear perfusion study in August 2013 was markedly improved did not reveal any region of scar or ischemia. He has been off amiodarone and maintaining sinus rhythm. Additional problems include hypertension, hyperlipidemia, and intermittent lower extremity edema.  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  An MRI of his brain showed a tiny left operculum, hemorrhage, infarct, is concerned that potentially this was thrombotic.  He did not have any loss of function of the upper or lower extremities and denied any headache, dizziness, visual symptoms, or difficulty swallowing.  His ECG at that time did suggest sinus rhythm.  He now is on anticoagulation with Xarelto 20 mg. His head CT was negative as was his MRA of the large and medium size vessels.  His carotids were widely patent.  He denies chest pain.  He denies any significant neurologic sequelae.  He is now in permanent atrial fibrillation on chronic anticoagulation with Xarelto.  He has had episodes of recurrent gout.  I had taken him off HCTZ since this could be contributing to elevation of uric acid.  He is now on low-dose chronic alliupurinol.    Since I last saw him, he has remained stable.  He is unaware of his heart rate going to fast.  He does try to  exercise.  He keeps active.  According to his wife he does have some issues with patience , which at times may increase his blood pressure.  He is able to walk without his previous dyspnea.  He has been taking furosemide every day with the exception of Sunday and Wednesday, which has for the most part controlled his intermittent ankle edema.  He also is on amlodipine 5 mg, metoprolol 25 mg in the morning and 12.5 mg at night, Diovan 320 mg daily for blood pressure control.  He continues to take simvastatin 20 Mill grams for hyperlipidemia.  He denies bleeding on Xarelto.  He presents for follow-up evaluation.  Past Medical History  Diagnosis Date  . Hypertension   . Anxiety   . 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 Atrial fibrillation ([place])   . CAD (coronary artery disease)     [name]  . Gout     Past Surgical History  Procedure Laterality Date  . Coronary artery bypass graft  03/07/2011    DR.VAN  TRIGHT  . Eye surgery    . Colon surgery    . Vascular doppler  03/03/2011    Nosignificant extracranial carotid artery stenosis  . Cardiac catheterization  03/02/2011    Recommended CABG  . Lexiscan myoview  02/20/2012    EKG negative for ischemia, noraml study  . Transthoracic echocardiogram  02/20/2012    EF 50-55%, mild-moderate concentric LVH, LA moderately dilated, moderate mitral regurg    No Known Allergies  Current Outpatient  Prescriptions  Medication Sig Dispense Refill  . allopurinol (ZYLOPRIM) 100 MG tablet Take 1 tablet by mouth daily.  2  . amLODipine (NORVASC) 5 MG tablet Take 1 tablet (5 mg total) by mouth daily. 90 tablet 3  . beta carotene w/minerals (OCUVITE) tablet Take 1 tablet by mouth daily.      . Cholecalciferol (VITAMIN D3) 5000 UNITS CAPS Take by mouth. Monday, Wednesday, Friday    . colchicine 0.6 MG tablet Take 0.6 mg by mouth as needed.    Marland Kitchen DIOVAN 320 MG tablet Take 1 tablet by mouth  daily 90 tablet 2  . fish oil-omega-3 fatty acids 1000 MG capsule  Take 1,200 mg by mouth daily.      . furosemide (LASIX) 20 MG tablet Every other day (Patient taking differently: Every day except Sundays and wednesdays) 90 tablet 3  . metoprolol tartrate (LOPRESSOR) 25 MG tablet Take 1 tablet by mouth in  the morning and one-half  tablet by mouth at night 135 tablet 2  . simvastatin (ZOCOR) 20 MG tablet Take 1 tablet by mouth at  bedtime 90 tablet 2  . XARELTO 20 MG TABS tablet Take 1 tablet by mouth  daily with supper 90 tablet 2  . [DISCONTINUED] amiodarone (PACERONE) 200 MG tablet Take 200 mg by mouth daily.      No current facility-administered medications for this visit.    Social History   Social History  . Marital Status: Married    Spouse Name: N/A  . Number of Children: 2  . Years of Education: college   Occupational History  . retitred    Social History Main Topics  . Smoking status: Never Smoker   . Smokeless tobacco: Never Used  . Alcohol Use: No  . Drug Use: No  . Sexual Activity: No   Other Topics Concern  . Not on file   Social History Narrative    Family History  Problem Relation Age of Onset  . CVA Father   . Heart attack Maternal Grandmother   . Heart attack Maternal Grandfather   . Heart murmur Sister   . Hypertension Son    Socially he is married has 2 children 4 grandchildren. He now exercises fairly regularly. There is no tobacco or alcohol use.   ROS General: Negative; No fevers, chills, or night sweats;  HEENT: Negative; No changes in vision or hearing, sinus congestion, difficulty swallowing Pulmonary: Negative; No cough, wheezing, shortness of breath, hemoptysis Cardiovascular: see history of present illness GI: Positive for hemorrhoids and he status post hemorrhoidal banding; No nausea, vomiting, diarrhea, or abdominal pain GU: Negative; No dysuria, hematuria, or difficulty voiding Musculoskeletal: Positive for gout;  no myalgias,  or weakness Hematologic/Oncology: Negative; no easy bruising,  bleeding Endocrine: Negative; no heat/cold intolerance; no diabetes Neuro: Positive for thrombotic stroke; no changes in balance, headaches Skin: Negative; No rashes or skin lesions Psychiatric: Negative; No behavioral problems, depression Sleep: Negative; No snoring, daytime sleepiness, hypersomnolence, bruxism, restless legs, hypnogognic hallucinations, no cataplexy Other comprehensive 14 point system review is negative.   PE BP 130/64 mmHg  Pulse 67  Ht 5' 9"  (1.753 m)  Wt 203 lb 1.6 oz (92.126 kg)  BMI 29.98 kg/m2   Repeat blood pressure by me and 14/68  Wt Readings from Last 3 Encounters:  05/26/15 203 lb 1.6 oz (92.126 kg)  12/01/14 207 lb 4.8 oz (94.031 kg)  06/03/14 199 lb 11.2 oz (90.583 kg)   General: Alert, oriented, no distress.  Skin: normal  turgor, no rashes HEENT: Normocephalic, atraumatic. Pupils round and reactive; sclera anicteric;no lid lag.  Nose without nasal septal hypertrophy Mouth/Parynx benign; Mallinpatti scale 3 Neck: No JVD, no carotid bruits Lungs: clear to ausculatation and percussion; no wheezing or rales Chest wall: Nontender to palpation Heart: Irregularly irregular rhythm with a controlled ventricular response in the 70s. s1 s2 normal  1/6 sem Abdomen: Mild central adiposity ; soft, nontender; no hepatosplenomehaly, BS+; abdominal aorta nontender and not dilated by palpation. Pulses 2+ Back: No CVA tenderness Extremities: no clubbing cyanosis or edema, Homan's sign negative  Neurologic: grossly nonfocal Psychologic: Normal affect and mood  ECG (independently read by me): Atrial fibrillation at 67 bpm.  Nonspecific ST changes.  QTc interval normal at 443 ms.  May 2016 ECG (independently read by me): Atrial fibrillation with a controlled ventricular rate in the 60s.  December 2015 ECG (independently read by me): Atrial fibrillation with a ventricular rate at 84.  No significant ST-T changes  June 2015 ECG (independently read by me): Atrial  fibrillation with a ventricular rate is 74.  Nonspecific ST changes.  10/09/2013 ECG (independently read by me): Atrial fibrillation with controlled ventricular response at 63 beats per minute.  Prior April 16 2013 ECG: Sinus bradycardia at 52 beats per minute.  LABS: BMP Latest Ref Rng 12/08/2014 09/24/2013 09/23/2013  Glucose 70 - 99 mg/dL 116(H) 101(H) 174(H)  BUN 6 - 23 mg/dL 11 15 19   Creatinine 0.50 - 1.35 mg/dL 0.90 0.80 1.10  Sodium 135 - 145 mEq/L 142 144 144  Potassium 3.5 - 5.3 mEq/L 3.8 3.9 3.0(L)  Chloride 96 - 112 mEq/L 103 103 103  CO2 19 - 32 mEq/L 27 26 -  Calcium 8.4 - 10.5 mg/dL 9.4 9.2 -   Hepatic Function Latest Ref Rng 12/08/2014 09/24/2013 09/23/2013  Total Protein 6.0 - 8.3 g/dL 7.1 6.7 7.2  Albumin 3.5 - 5.2 g/dL 4.1 3.3(L) 3.7  AST 0 - 37 U/L 24 25 30   ALT 0 - 53 U/L 19 22 28   Alk Phosphatase 39 - 117 U/L 65 54 61  Total Bilirubin 0.2 - 1.2 mg/dL 1.1 0.7 0.6   CBC Latest Ref Rng 12/08/2014 09/24/2013 09/23/2013  WBC 4.0 - 10.5 K/uL 8.0 9.8 -  Hemoglobin 13.0 - 17.0 g/dL 15.4 14.7 16.3  Hematocrit 39.0 - 52.0 % 46.9 43.2 48.0  Platelets 150 - 400 K/uL 208 203 -   Lab Results  Component Value Date   MCV 88.2 12/08/2014   MCV 89.8 09/24/2013   MCV 89.7 09/23/2013   Lab Results  Component Value Date   TSH 2.031 12/08/2014   Lab Results  Component Value Date   HGBA1C 6.2* 09/24/2013   Lipid Panel     Component Value Date/Time   CHOL 108 12/08/2014 0803   TRIG 106 12/08/2014 0803   HDL 41 12/08/2014 0803   CHOLHDL 2.6 12/08/2014 0803   VLDL 21 12/08/2014 0803   LDLCALC 46 12/08/2014 0803    ASSESSMENT AND PLAN: Mr. Fullington is a 74 year-old white male who is 4 years status post CABG revascularization surgery for severe multivessel CAD.  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  He is now in permanent atrial fibrillation with a controlled ventricular  response for which he is entirely asymptomatic.  He is now on Xarelto anticoagulation therapy. He denies any bleeding. He is unaware of his heart rate being irregular.  His resting pulse is in the mid 60's. Previously he admitted to at times having increased heart rate and he has done well with his increased beta blocker dose, which now is 25 mg in the morning and 12.5 mg at night.  His gout is now well controlled and he continues to take daily allopurinol and colchicine.  He is now on furosemide which he takes 5-7 days which the most part is controlled.  His mild pedal edema.  We again discussed sodium restriction.  We discussed that if he does note some swelling on the day that he is supposed to take it can take this therapy.  I reviewed his laboratory from June which was excellent.  He is tolerating simvastatin with excellent LDL at 46.  There are no myalgias.  His way of been fairly stable.  He's lost 4 pounds since his last office visit and these just under the "obesity " threshold.  He has a hemoglobin A1c, which places him in the metabolic syndrome category.  Fasting glucose in June was mildly increased at 116.  We again discussed improved diet.  He'll continue his current medical regimen.  As long as he remains stable I'll see him in 6 months for reevaluation or sooner if problems arise.  Time spent: 25 minutes  Troy Sine, MD, [facility]  05/26/2015 8:26 AM

## 2015-05-31 ENCOUNTER — Encounter: Payer: Self-pay | Admitting: Cardiovascular Disease

## 2015-06-18 ENCOUNTER — Ambulatory Visit (INDEPENDENT_AMBULATORY_CARE_PROVIDER_SITE_OTHER): Payer: Medicare Other | Admitting: Ophthalmology

## 2015-06-18 DIAGNOSIS — H35341 Macular cyst, hole, or pseudohole, right eye: Secondary | ICD-10-CM

## 2015-06-18 DIAGNOSIS — H35033 Hypertensive retinopathy, bilateral: Secondary | ICD-10-CM

## 2015-06-18 DIAGNOSIS — H35372 Puckering of macula, left eye: Secondary | ICD-10-CM | POA: Diagnosis not present

## 2015-06-18 DIAGNOSIS — I10 Essential (primary) hypertension: Secondary | ICD-10-CM | POA: Diagnosis not present

## 2015-06-18 DIAGNOSIS — H43812 Vitreous degeneration, left eye: Secondary | ICD-10-CM

## 2015-08-09 ENCOUNTER — Telehealth: Payer: Self-pay

## 2015-08-09 NOTE — Telephone Encounter (Signed)
Prior auth for Xarelto 20mg sent to [facility]. 

## 2015-08-10 ENCOUNTER — Telehealth: Payer: Self-pay

## 2015-08-10 NOTE — Telephone Encounter (Signed)
Xarelto approved through 07/02/2016. PA -[id number].

## 2015-08-20 ENCOUNTER — Other Ambulatory Visit: Payer: Self-pay | Admitting: Cardiovascular Disease

## 2015-08-20 NOTE — Telephone Encounter (Signed)
Rx(s) sent to pharmacy electronically.  

## 2015-11-10 ENCOUNTER — Telehealth: Payer: Self-pay | Admitting: Cardiovascular Disease

## 2015-11-11 NOTE — Telephone Encounter (Signed)
Close encounter 

## 2016-01-05 ENCOUNTER — Other Ambulatory Visit: Payer: Self-pay | Admitting: Cardiovascular Disease

## 2016-03-02 ENCOUNTER — Telehealth: Payer: Self-pay | Admitting: *Deleted

## 2016-03-02 NOTE — Telephone Encounter (Signed)
Requesting surgical clearance:   1. Type of surgery: colonoscopy  2. Surgeon: Angela Nevin  3. Surgical date: 03/28/16  4. Medications that need to be held: XARELTO 20 mg                       Special instructions:

## 2016-03-02 NOTE — Telephone Encounter (Signed)
Clearance sent to Dr Claiborne Billings on 03/02/16.

## 2016-03-03 NOTE — Telephone Encounter (Signed)
Hold xarelto for 48 hrs prior to procedure.

## 2016-03-07 NOTE — Telephone Encounter (Signed)
Faxed to Dr Earlean Shawl via [employer].

## 2016-04-12 ENCOUNTER — Other Ambulatory Visit: Payer: Self-pay | Admitting: Cardiovascular Disease

## 2016-04-13 ENCOUNTER — Other Ambulatory Visit: Payer: Self-pay

## 2016-04-13 MED ORDER — AMLODIPINE BESYLATE 5 MG PO TABS: 5.0000 mg | ORAL_TABLET | Freq: Every day | ORAL | 0 refills | Status: DC

## 2016-05-29 ENCOUNTER — Encounter: Payer: Self-pay | Admitting: *Deleted

## 2016-05-30 ENCOUNTER — Encounter: Payer: Self-pay | Admitting: Cardiovascular Disease

## 2016-05-30 ENCOUNTER — Ambulatory Visit (INDEPENDENT_AMBULATORY_CARE_PROVIDER_SITE_OTHER): Payer: Medicare Other | Admitting: Cardiovascular Disease

## 2016-05-30 VITALS — BP 148/74 | HR 70 | Ht 68.5 in | Wt 208.4 lb

## 2016-05-30 DIAGNOSIS — I2583 Coronary atherosclerosis due to lipid rich plaque: Secondary | ICD-10-CM

## 2016-05-30 DIAGNOSIS — I2510 Atherosclerotic heart disease of native coronary artery without angina pectoris: Secondary | ICD-10-CM | POA: Diagnosis not present

## 2016-05-30 DIAGNOSIS — I482 Chronic atrial fibrillation, unspecified: Secondary | ICD-10-CM

## 2016-05-30 DIAGNOSIS — Z951 Presence of aortocoronary bypass graft: Secondary | ICD-10-CM

## 2016-05-30 DIAGNOSIS — F419 Anxiety disorder, unspecified: Secondary | ICD-10-CM | POA: Diagnosis not present

## 2016-05-30 DIAGNOSIS — E785 Hyperlipidemia, unspecified: Secondary | ICD-10-CM

## 2016-05-30 DIAGNOSIS — Z7901 Long term (current) use of anticoagulants: Secondary | ICD-10-CM

## 2016-05-30 DIAGNOSIS — I10 Essential (primary) hypertension: Secondary | ICD-10-CM

## 2016-05-30 MED ORDER — FUROSEMIDE 20 MG PO TABS: ORAL_TABLET | ORAL | 2 refills | Status: DC

## 2016-05-30 NOTE — Patient Instructions (Signed)
Your physician has requested that you have a lexiscan myoview in August 2018.  Your physician recommends that you schedule a follow-up appointment in: September 2018  Your physician has recommended you make the following change in your medication: [place].

## 2016-05-30 NOTE — Progress Notes (Signed)
Patient ID: Shane Espinoza, male   DOB: [birth year], 75 y.o.   MRN: [id number]      HPI: Shane Espinoza is a 75 y.o. male who presents to the office for one year cardiology evaluation.  [name] has established CAD and underwent CABG revascularization surgery by Dr. Nils Pyle on 03/07/2011 after a nuclear perfusion study revealed significant scar/ischemia and cardiac catheterization demonstrated severe multivessel CAD. He had aLIMA to the LAD, vein to the OM, vein to the RV marginal, and vein to the PDA. He  developed PAF postoperatively and underwent TEE guided cardioversion with restoration of sinus rhythm. He completed cardiac rehabilitation. A nuclear perfusion study in August 2013 was markedly improved did not reveal any region of scar or ischemia. He has been off amiodarone and maintaining sinus rhythm. Additional problems include hypertension, hyperlipidemia, and intermittent lower extremity edema.  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  An MRI of his brain showed a tiny left operculum, hemorrhage, infarct, is concerned that potentially this was thrombotic.  He did not have any loss of function of the upper or lower extremities and denied any headache, dizziness, visual symptoms, or difficulty swallowing.  His ECG at that time did suggest sinus rhythm.  He now is on anticoagulation with Xarelto 20 mg. His head CT was negative as was his MRA of the large and medium size vessels.  His carotids were widely patent.  He denies chest pain.  He denies any significant neurologic sequelae.  He is now in permanent atrial fibrillation on chronic anticoagulation with Xarelto.  He has had episodes of recurrent gout.  I had taken him off HCTZ since this could be contributing to elevation of uric acid.  He is now on low-dose chronic alliupurinol and since instituting therapy has not had any recurrent gout episodes.   Since I last saw him in November 2017.  He  has continued to feel well from a cardiovascular standpoint.  He denies any episodes of chest pain or palpitations.  He denies presyncope or syncope.  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  According to his wife he does have some issues with patience , which at times may increase his blood pressure.  He is able to walk without his previous dyspnea.  He has been taking furosemide every day with the exception of Sunday and Wednesday.  He also is on amlodipine 5 mg, metoprolol 25 mg in the morning and 12.5 mg at night, valsartan 320 mg daily for blood pressure control.  He continues to take simvastatin 20 mg for hyperlipidemia.  He denies bleeding on Xarelto.  He presents for follow-up evaluation.  Past Medical History:  Diagnosis Date  . Anxiety   . Atrial fibrillation ([place])   . CAD (coronary artery disease)    [name]  . Gout   . Hypertension   . 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 CARDIAC CATHETERIZATION  03/02/2011   Recommended CABG  . COLON SURGERY    . CORONARY ARTERY BYPASS GRAFT  03/07/2011   DR.VAN  TRIGHT  . EYE SURGERY    . LEXISCAN MYOVIEW  02/20/2012   EKG negative for ischemia, noraml study  . TRANSTHORACIC ECHOCARDIOGRAM  02/20/2012   EF 50-55%, mild-moderate concentric LVH, LA moderately dilated, moderate mitral regurg  .  VASCULAR DOPPLER  03/03/2011   Nosignificant extracranial carotid artery stenosis    No Known Allergies  Current Outpatient Prescriptions  Medication Sig Dispense Refill  . allopurinol (ZYLOPRIM) 100 MG tablet Take 1 tablet by mouth daily.  2  . amLODipine (NORVASC) 5 MG tablet Take 1 tablet (5 mg total) by mouth daily. 90 tablet 0  . beta carotene w/minerals (OCUVITE) tablet Take 1 tablet by mouth daily.      . Cholecalciferol (VITAMIN D3) 5000 UNITS CAPS Take by mouth. Monday, Wednesday, Friday    .  colchicine 0.6 MG tablet Take 0.6 mg by mouth as needed.    . fish oil-omega-3 fatty acids 1000 MG capsule Take 1,200 mg by mouth daily.      . furosemide (LASIX) 20 MG tablet Take 1 tablet (20 mg total) by mouth daily except for Sundays and Wednesdays. 90 tablet 2  . imiquimod (ALDARA) 5 % cream Apply 1 application topically as directed.    . metoprolol tartrate (LOPRESSOR) 25 MG tablet Take 1 tablet by mouth in  the morning and one-half  tablet by mouth at night 135 tablet 0  . simvastatin (ZOCOR) 20 MG tablet Take 1 tablet by mouth at  bedtime 90 tablet 0  . valsartan (DIOVAN) 320 MG tablet Take 1 tablet by mouth  daily 90 tablet 0  . XARELTO 20 MG TABS tablet Take 1 tablet by mouth  daily with supper 90 tablet 0   No current facility-administered medications for this visit.     Social History   Social History  . Marital status: Married    Spouse name: N/A  . Number of children: 2  . Years of education: college   Occupational History  . retitred    Social History Main Topics  . Smoking status: Never Smoker  . Smokeless tobacco: Never Used  . Alcohol use No  . Drug use: No  . Sexual activity: No   Other Topics Concern  . Not on file   Social History Narrative  . No narrative on file    Family History  Problem Relation Age of Onset  . CVA Father   . Heart attack Maternal Grandmother   . Heart attack Maternal Grandfather   . Heart murmur Sister   . Hypertension Son    Socially he is married has 2 children 4 grandchildren. He now exercises fairly regularly. There is no tobacco or alcohol use.   ROS General: Negative; No fevers, chills, or night sweats;  HEENT: Negative; No changes in vision or hearing, sinus congestion, difficulty swallowing Pulmonary: Negative; No cough, wheezing, shortness of breath, hemoptysis Cardiovascular: see history of present illness GI: Positive for hemorrhoids and he status post hemorrhoidal banding; No nausea, vomiting, diarrhea, or  abdominal pain GU: Negative; No dysuria, hematuria, or difficulty voiding Musculoskeletal: Positive for gout;  no myalgias,  or weakness Hematologic/Oncology: Negative; no easy bruising, bleeding Endocrine: Negative; no heat/cold intolerance; no diabetes Neuro: Positive for thrombotic stroke; no changes in balance, headaches Skin: Negative; No rashes or skin lesions Psychiatric: Negative; No behavioral problems, depression Sleep: Negative; No snoring, daytime sleepiness, hypersomnolence, bruxism, restless legs, hypnogognic hallucinations, no cataplexy Other comprehensive 14 point system review is negative.   PE BP (!) 148/74   Pulse 70   Ht 5' 8.5" (1.74 m)   Wt 208 lb 6.4 oz (94.5 kg)   BMI 31.23 kg/m    Wt Readings from Last 3 Encounters:  05/30/16 208 lb 6.4 oz (94.5 kg)  05/26/15  203 lb 1.6 oz (92.1 kg)  12/01/14 207 lb 4.8 oz (94 kg)   General: Alert, oriented, no distress.  Skin: normal turgor, no rashes HEENT: Normocephalic, atraumatic. Pupils round and reactive; sclera anicteric;no lid lag.  Nose without nasal septal hypertrophy Mouth/Parynx benign; Mallinpatti scale 3 Neck: No JVD, no carotid bruits Lungs: clear to ausculatation and percussion; no wheezing or rales Chest wall: Nontender to palpation Heart: Irregularly irregular rhythm with a controlled ventricular response in the 70s. s1 s2 normal  1/6 sem Abdomen: Mild central adiposity ; soft, nontender; no hepatosplenomehaly, BS+; abdominal aorta nontender and not dilated by palpation. Pulses 2+ Back: No CVA tenderness Extremities: trace - 1+ pitting edema to the pretibial region; no clubbing cyanosis, Homan's sign negative  Neurologic: grossly nonfocal Psychologic: Normal affect and mood  ECG (independently read by me): Atrial fibrillation at 70 bpm.  Poor anterior R-wave progression.  QTc interval 475 ms.  November 2016 ECG (independently read by me): Atrial fibrillation at 67 bpm.  Nonspecific ST changes.   QTc interval normal at 443 ms.  May 2016 ECG (independently read by me): Atrial fibrillation with a controlled ventricular rate in the 60s.  December 2015 ECG (independently read by me): Atrial fibrillation with a ventricular rate at 84.  No significant ST-T changes  June 2015 ECG (independently read by me): Atrial fibrillation with a ventricular rate is 74.  Nonspecific ST changes.  10/09/2013 ECG (independently read by me): Atrial fibrillation with controlled ventricular response at 63 beats per minute.  Prior April 16 2013 ECG: Sinus bradycardia at 52 beats per minute.  LABS: BMP Latest Ref Rng & Units 12/08/2014 09/24/2013 09/23/2013  Glucose 70 - 99 mg/dL 116(H) 101(H) 174(H)  BUN 6 - 23 mg/dL 11 15 19   Creatinine 0.50 - 1.35 mg/dL 0.90 0.80 1.10  Sodium 135 - 145 mEq/L 142 144 144  Potassium 3.5 - 5.3 mEq/L 3.8 3.9 3.0(L)  Chloride 96 - 112 mEq/L 103 103 103  CO2 19 - 32 mEq/L 27 26 -  Calcium 8.4 - 10.5 mg/dL 9.4 9.2 -   Hepatic Function Latest Ref Rng & Units 12/08/2014 09/24/2013 09/23/2013  Total Protein 6.0 - 8.3 g/dL 7.1 6.7 7.2  Albumin 3.5 - 5.2 g/dL 4.1 3.3(L) 3.7  AST 0 - 37 U/L 24 25 30   ALT 0 - 53 U/L 19 22 28   Alk Phosphatase 39 - 117 U/L 65 54 61  Total Bilirubin 0.2 - 1.2 mg/dL 1.1 0.7 0.6   CBC Latest Ref Rng & Units 12/08/2014 09/24/2013 09/23/2013  WBC 4.0 - 10.5 K/uL 8.0 9.8 -  Hemoglobin 13.0 - 17.0 g/dL 15.4 14.7 16.3  Hematocrit 39.0 - 52.0 % 46.9 43.2 48.0  Platelets 150 - 400 K/uL 208 203 -   Lab Results  Component Value Date   MCV 88.2 12/08/2014   MCV 89.8 09/24/2013   MCV 89.7 09/23/2013   Lab Results  Component Value Date   TSH 2.031 12/08/2014   Lab Results  Component Value Date   HGBA1C 6.2 (H) 09/24/2013   Lipid Panel     Component Value Date/Time   CHOL 108 12/08/2014 0803   TRIG 106 12/08/2014 0803   HDL 41 12/08/2014 0803   CHOLHDL 2.6 12/08/2014 0803   VLDL 21 12/08/2014 0803   LDLCALC 46 12/08/2014 0803    ASSESSMENT AND  PLAN: Mr. Silas is a 75 year-old white male who is 5 years status post CABG revascularization surgery for severe multivessel CAD in September 2012.  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  He is now in permanent atrial fibrillation with a controlled ventricular response for which he is entirely asymptomatic.  He is now on Xarelto anticoagulation therapy. He denies any bleeding. He is unaware of his heart rate being irregular.  His resting pulse is in the mid 60's. Previously he admitted to at times having increased heart rate and he has done well with his increased beta blocker dose, which now is 25 mg in the morning and 12.5 mg at night.  His gout is now well controlled and he continues to take daily allopurinol and colchicine.  He has been taking furosemide 5 days per week but continues to have some lower extremity edema.  As result, I have recommended he change this and take 20 mg on a daily basis.  He continues to use kneehighs compression socks.  We again discussed sodium restriction.  He will be having follow-up blood work by Dr. Inda Merlin.  His [facility] physician at [facility].  Target LDL is less than 70.  He is tolerating simvastatin and lab work last year was excellent with an LDL of 47.  I have recommended that prior to seeing me next year that he undergo a follow-up nuclear perfusion study, which will be 6 years after his CABG revascularization surgery.  I will see him in the office in follow-up and further recommendations will be made at that time.  Time spent: 25 minutes  Troy Sine, MD, [facility]  05/30/2016 8:33 AM

## 2016-05-31 ENCOUNTER — Other Ambulatory Visit: Payer: Self-pay | Admitting: Cardiovascular Disease

## 2016-06-14 ENCOUNTER — Other Ambulatory Visit: Payer: Self-pay | Admitting: Cardiovascular Disease

## 2016-06-15 ENCOUNTER — Other Ambulatory Visit: Payer: Self-pay

## 2016-06-15 MED ORDER — AMLODIPINE BESYLATE 5 MG PO TABS: 5.0000 mg | ORAL_TABLET | Freq: Every day | ORAL | 3 refills | Status: DC

## 2016-06-19 ENCOUNTER — Ambulatory Visit (INDEPENDENT_AMBULATORY_CARE_PROVIDER_SITE_OTHER): Payer: Medicare Other | Admitting: Ophthalmology

## 2016-06-19 DIAGNOSIS — I10 Essential (primary) hypertension: Secondary | ICD-10-CM | POA: Diagnosis not present

## 2016-06-19 DIAGNOSIS — H35033 Hypertensive retinopathy, bilateral: Secondary | ICD-10-CM | POA: Diagnosis not present

## 2016-06-19 DIAGNOSIS — H43812 Vitreous degeneration, left eye: Secondary | ICD-10-CM

## 2016-06-19 DIAGNOSIS — H35372 Puckering of macula, left eye: Secondary | ICD-10-CM | POA: Diagnosis not present

## 2016-06-19 DIAGNOSIS — H35341 Macular cyst, hole, or pseudohole, right eye: Secondary | ICD-10-CM

## 2016-12-25 ENCOUNTER — Telehealth: Payer: Self-pay | Admitting: Cardiovascular Disease

## 2016-12-25 NOTE — Telephone Encounter (Signed)
Pt says he is very anxious about his Stress test he is having in August. Pt says he would like to talk to Dr Claiborne Billings about the Stress Test.

## 2016-12-25 NOTE — Telephone Encounter (Signed)
S/w pt he states that he is very anxious about having his lexiscan done in august. Discussed procedure with pt he states that he will keep his appt and await result.

## 2017-01-19 ENCOUNTER — Telehealth: Payer: Self-pay

## 2017-01-19 MED ORDER — IRBESARTAN 300 MG PO TABS: 300.0000 mg | ORAL_TABLET | Freq: Every day | ORAL | 6 refills | Status: DC

## 2017-01-19 NOTE — Telephone Encounter (Signed)
Acknowledged.

## 2017-01-19 NOTE — Telephone Encounter (Signed)
Received walk in form patient wanting to know about Valsartan.Called patient advised to stop Valsartan.Start Irbesartan 300 mg daily.Advised to monitor B/P 2 to 3 times a week for the next 2 weeks.Call back if B/P elevated.

## 2017-01-29 ENCOUNTER — Telehealth: Payer: Self-pay | Admitting: Cardiovascular Disease

## 2017-01-29 NOTE — Telephone Encounter (Signed)
Returned call to wife. Notified her that MD acknowledged med change. No issues w/current medications.   They are keeping track of home BPs Patient states his BP is "consistent"  Educated on when to take BP at home   Patient states he has constipation - wonders if is SE of ARB. Advised if no issue w/valsartan, then he should be OK with irbesartan.   Patient is worried about upcoming stress test - about 6 years post CABG. Advised this is a routine check per guidelines for post-bypass patients. Educated on what all the test would look at, etc. All questions answered to patient/wife satisfaction.

## 2017-01-29 NOTE — Telephone Encounter (Signed)
New message     Pt c/o medication issue:  1. Name of Medication:  avapro  2. How are you currently taking this medication (dosage and times per day)?  300mg   3. Are you having a reaction (difficulty breathing--STAT)? no  4. What is your medication issue?  This medication replaced valsartan.  Calling to see if pt can take this medication in the am along with his allopurinol, amlodipine, metoprolol and lasix.  Instructions says to ask provider if this medication can be taken with other drugs.

## 2017-02-12 ENCOUNTER — Other Ambulatory Visit: Payer: Self-pay | Admitting: Cardiovascular Disease

## 2017-02-12 MED ORDER — IRBESARTAN 300 MG PO TABS
300.0000 mg | ORAL_TABLET | Freq: Every day | ORAL | 0 refills | Status: DC
Start: 2017-02-12 — End: 2017-04-02

## 2017-02-12 NOTE — Telephone Encounter (Signed)
Left message on patients voicemail informing him that rx for Irbesartan was sent to [facility]. Advised patient to contact office if he had any concerns.

## 2017-02-12 NOTE — Telephone Encounter (Signed)
New message    Pt wife is calling about pt medication. She said she is having trouble getting it at [facility] and would like for it to be called into [facility].    *STAT* If patient is at the pharmacy, call can be transferred to refill team.   1. Which medications need to be refilled? (please list name of each medication and dose if known) Irbesartan 300 mg  2. Which pharmacy/location (including street and city if local pharmacy) is medication to be sent to? [facility]  3. Do they need a 30 day or 90 day supply? 90 day

## 2017-02-13 ENCOUNTER — Telehealth (HOSPITAL_COMMUNITY): Payer: Self-pay | Admitting: Cardiovascular Disease

## 2017-02-14 NOTE — Telephone Encounter (Signed)
User: Cherie Dark A Date/time: 02/13/17 2:22 PM  Comment: Called pt and lmsg for him to CB to r/s his appt time for myoview on 8/21.   Context:  Outcome: Left Message  Phone number: [phone] Phone Type: Home Phone  Comm. type: Telephone Call type: Outgoing  Contact: Jeanine Luz F Relation to patient: Self

## 2017-02-15 ENCOUNTER — Telehealth (HOSPITAL_COMMUNITY): Payer: Self-pay

## 2017-02-15 NOTE — Telephone Encounter (Signed)
Encounter complete. 

## 2017-02-20 ENCOUNTER — Ambulatory Visit (HOSPITAL_COMMUNITY)
Admission: RE | Admit: 2017-02-20 | Discharge: 2017-02-20 | Disposition: A | Discharge status: Home / Self Care | Payer: Medicare Other | Source: Ambulatory Visit | Attending: Cardiology | Admitting: Cardiology

## 2017-02-20 DIAGNOSIS — I2510 Atherosclerotic heart disease of native coronary artery without angina pectoris: Secondary | ICD-10-CM

## 2017-02-20 DIAGNOSIS — I482 Chronic atrial fibrillation, unspecified: Secondary | ICD-10-CM

## 2017-02-20 DIAGNOSIS — Z951 Presence of aortocoronary bypass graft: Secondary | ICD-10-CM | POA: Diagnosis present

## 2017-02-20 DIAGNOSIS — I2583 Coronary atherosclerosis due to lipid rich plaque: Secondary | ICD-10-CM | POA: Diagnosis present

## 2017-02-20 LAB — MYOCARDIAL PERFUSION IMAGING
CHL CUP NUCLEAR SRS: 0
LV dias vol: 140 mL (ref 62–150)
LVSYSVOL: 74 mL
Peak HR: 88 {beats}/min
Rest HR: 78 {beats}/min
SDS: 2
SSS: 2
TID: 1.06

## 2017-02-20 MED ORDER — TECHNETIUM TC 99M TETROFOSMIN IV KIT
28.5000 | PACK | Freq: Once | INTRAVENOUS | Status: AC | PRN
  Administered 2017-02-20: 28.5 via INTRAVENOUS
  Filled 2017-02-20: qty 29

## 2017-02-20 MED ORDER — REGADENOSON 0.4 MG/5ML IV SOLN
0.4000 mg | Freq: Once | INTRAVENOUS | Status: AC
  Administered 2017-02-20: 0.4 mg via INTRAVENOUS

## 2017-02-20 MED ORDER — TECHNETIUM TC 99M TETROFOSMIN IV KIT
9.4000 | PACK | Freq: Once | INTRAVENOUS | Status: AC | PRN
  Administered 2017-02-20: 9.4 via INTRAVENOUS
  Filled 2017-02-20: qty 10

## 2017-02-22 ENCOUNTER — Other Ambulatory Visit: Payer: Self-pay | Admitting: Cardiovascular Disease

## 2017-02-22 ENCOUNTER — Telehealth: Payer: Self-pay | Admitting: Cardiovascular Disease

## 2017-02-22 DIAGNOSIS — I2583 Coronary atherosclerosis due to lipid rich plaque: Principal | ICD-10-CM

## 2017-02-22 DIAGNOSIS — I10 Essential (primary) hypertension: Secondary | ICD-10-CM

## 2017-02-22 DIAGNOSIS — I2510 Atherosclerotic heart disease of native coronary artery without angina pectoris: Secondary | ICD-10-CM

## 2017-02-22 DIAGNOSIS — Z951 Presence of aortocoronary bypass graft: Secondary | ICD-10-CM

## 2017-02-22 DIAGNOSIS — I482 Chronic atrial fibrillation, unspecified: Secondary | ICD-10-CM

## 2017-02-22 NOTE — Telephone Encounter (Signed)
,  Spoke with pt, aware of nuclear test results. will confirm with dr Claiborne Billings if echo needed.

## 2017-02-22 NOTE — Telephone Encounter (Signed)
New message    Pt is calling about his results. Please call.

## 2017-03-07 ENCOUNTER — Telehealth: Payer: Self-pay | Admitting: Cardiovascular Disease

## 2017-03-07 NOTE — Telephone Encounter (Signed)
Per Dr. Claiborne Billings, echo to be repeated, last echo 2015.        Patient aware and verbalized understanding.      Order placed.   Send to admin pool to schedule.

## 2017-03-07 NOTE — Telephone Encounter (Signed)
Called the patient and left a VM to call back to schedule echo.

## 2017-03-14 ENCOUNTER — Ambulatory Visit (HOSPITAL_COMMUNITY): Payer: Medicare Other | Attending: Cardiovascular Disease

## 2017-03-14 ENCOUNTER — Other Ambulatory Visit: Payer: Self-pay

## 2017-03-14 DIAGNOSIS — I2583 Coronary atherosclerosis due to lipid rich plaque: Secondary | ICD-10-CM | POA: Diagnosis present

## 2017-03-14 DIAGNOSIS — I10 Essential (primary) hypertension: Secondary | ICD-10-CM

## 2017-03-14 DIAGNOSIS — Z951 Presence of aortocoronary bypass graft: Secondary | ICD-10-CM | POA: Diagnosis not present

## 2017-03-14 DIAGNOSIS — I482 Chronic atrial fibrillation, unspecified: Secondary | ICD-10-CM

## 2017-03-14 DIAGNOSIS — I420 Dilated cardiomyopathy: Secondary | ICD-10-CM | POA: Insufficient documentation

## 2017-03-14 DIAGNOSIS — I2510 Atherosclerotic heart disease of native coronary artery without angina pectoris: Secondary | ICD-10-CM

## 2017-04-02 ENCOUNTER — Other Ambulatory Visit: Payer: Self-pay | Admitting: Cardiovascular Disease

## 2017-04-02 NOTE — Telephone Encounter (Signed)
REFILL 

## 2017-04-18 ENCOUNTER — Other Ambulatory Visit: Payer: Self-pay

## 2017-04-18 MED ORDER — FUROSEMIDE 20 MG PO TABS: ORAL_TABLET | ORAL | 0 refills | Status: DC

## 2017-04-26 ENCOUNTER — Encounter: Payer: Self-pay | Admitting: Cardiovascular Disease

## 2017-04-26 ENCOUNTER — Ambulatory Visit (INDEPENDENT_AMBULATORY_CARE_PROVIDER_SITE_OTHER): Payer: Medicare Other | Admitting: Cardiovascular Disease

## 2017-04-26 VITALS — BP 147/78 | HR 62 | Ht 69.0 in | Wt 208.2 lb

## 2017-04-26 DIAGNOSIS — Z7901 Long term (current) use of anticoagulants: Secondary | ICD-10-CM

## 2017-04-26 DIAGNOSIS — I2510 Atherosclerotic heart disease of native coronary artery without angina pectoris: Secondary | ICD-10-CM

## 2017-04-26 DIAGNOSIS — E785 Hyperlipidemia, unspecified: Secondary | ICD-10-CM

## 2017-04-26 DIAGNOSIS — R600 Localized edema: Secondary | ICD-10-CM | POA: Diagnosis not present

## 2017-04-26 DIAGNOSIS — I2583 Coronary atherosclerosis due to lipid rich plaque: Secondary | ICD-10-CM | POA: Diagnosis not present

## 2017-04-26 DIAGNOSIS — I482 Chronic atrial fibrillation, unspecified: Secondary | ICD-10-CM

## 2017-04-26 DIAGNOSIS — I10 Essential (primary) hypertension: Secondary | ICD-10-CM

## 2017-04-26 DIAGNOSIS — Z951 Presence of aortocoronary bypass graft: Secondary | ICD-10-CM | POA: Diagnosis not present

## 2017-04-26 MED ORDER — METOPROLOL SUCCINATE ER 25 MG PO TB24: 25.0000 mg | ORAL_TABLET | Freq: Every day | ORAL | 0 refills | Status: DC

## 2017-04-26 MED ORDER — FUROSEMIDE 20 MG PO TABS: ORAL_TABLET | ORAL | 3 refills | Status: DC

## 2017-04-26 MED ORDER — METOPROLOL SUCCINATE ER 25 MG PO TB24: 25.0000 mg | ORAL_TABLET | Freq: Every day | ORAL | 3 refills | Status: DC

## 2017-04-26 NOTE — Progress Notes (Signed)
Patient ID: Shane Espinoza, male   DOB: 1941/02/24, 76 y.o.   MRN: [id number]      HPI: Shane Espinoza is a 76 y.o. male who presents to the office for one year cardiology evaluation.    Shane Espinoza has established CAD and underwent CABG revascularization surgery by Dr. Nils Pyle on 03/07/2011 after a nuclear perfusion study revealed significant scar/ischemia and cardiac catheterization demonstrated severe multivessel CAD. He had aLIMA to the LAD, vein to the OM, vein to the RV marginal, and vein to the PDA. He  developed PAF postoperatively and underwent TEE guided cardioversion with restoration of sinus rhythm. He completed cardiac rehabilitation. A nuclear perfusion study in August 2013 was markedly improved did not reveal any region of scar or ischemia. He has been off amiodarone and maintaining sinus rhythm. Additional problems include hypertension, hyperlipidemia, and intermittent lower extremity edema.  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  An MRI of his brain showed a tiny left operculum, hemorrhage, infarct, is concerned that potentially this was thrombotic.  He did not have any loss of function of the upper or lower extremities and denied any headache, dizziness, visual symptoms, or difficulty swallowing.  His ECG at that time did suggest sinus rhythm.  He now is on anticoagulation with Xarelto 20 mg. His head CT was negative as was his MRA of the large and medium size vessels.  His carotids were widely patent.  He denies chest pain.  He denies any significant neurologic sequelae.  He is now in permanent atrial fibrillation on chronic anticoagulation with Xarelto.  He has had episodes of recurrent gout.  I had taken him off HCTZ since this could be contributing to elevation of uric acid.  He is now on low-dose chronic alliupurinol and since instituting therapy has not had any recurrent gout episodes.   Since I last saw him in November 2017 he  denies episodes of chest pain or palpitations, presyncope or syncope.  He underwent a nuclear stress test on 02/20/2017 which showed an EF of 47% with mild diffuse hypokinesis.  Is no evidence for scar or ischemia.  Because of the slightly reduced EF.  He underwent a echo Doppler study on 03/14/2017.  This showed an estimated EF at 55% without regional wall motion abnormalities.  His left atrium was severely dilated.  Contributed by his atrial fibrillation.  At times he admits to some leg swelling.  He continues to be on amlodipine at 2.5 mg, which was reduced because of edema, furosemide 20 mg 5 days per week, irbesartan 300 mg and metoprolol, tartrate 25 mg in the morning and 12.5 mmol at night.  He is on simvastatin for hyperlipidemia and Xarelto for anticoagulation.  He presents for evaluation.    Past Medical History:  Diagnosis Date  . Anxiety   . Atrial fibrillation ([place])   . CAD (coronary artery disease)    Shane Espinoza  . Gout   . Hypertension   . 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 CARDIAC CATHETERIZATION  03/02/2011   Recommended CABG  . COLON SURGERY    . CORONARY ARTERY BYPASS GRAFT  03/07/2011   DR.VAN  Espinoza  . EYE SURGERY    . LEXISCAN MYOVIEW  02/20/2012   EKG negative for ischemia, noraml study  . TRANSTHORACIC ECHOCARDIOGRAM  02/20/2012   EF 50-55%, mild-moderate concentric LVH, LA moderately dilated, moderate mitral  regurg  . VASCULAR DOPPLER  03/03/2011   Nosignificant extracranial carotid artery stenosis    No Known Allergies  Current Outpatient Prescriptions  Medication Sig Dispense Refill  . allopurinol (ZYLOPRIM) 100 MG tablet Take 1 tablet by mouth daily.  2  . amLODipine (NORVASC) 5 MG tablet Take 1 tablet (5 mg total) by mouth daily. 90 tablet 3  . beta carotene w/minerals (OCUVITE) tablet Take 1 tablet by mouth daily.      . Cholecalciferol (VITAMIN D3) 5000 UNITS CAPS Take by mouth. Monday,  Wednesday, Friday    . colchicine 0.6 MG tablet Take 0.6 mg by mouth as needed.    . fish oil-omega-3 fatty acids 1000 MG capsule Take 1,200 mg by mouth daily.      . furosemide (LASIX) 20 MG tablet TAKE 1 TABLET DAILY, take extra 1m AS NEEDED for swelling 120 tablet 3  . imiquimod (ALDARA) 5 % cream Apply 1 application topically as directed.    . irbesartan (AVAPRO) 300 MG tablet Take 1 tablet (300 mg total) by mouth daily. NEED OV. 90 tablet 0  . simvastatin (ZOCOR) 20 MG tablet TAKE 1 TABLET BY MOUTH AT  BEDTIME 90 tablet 1  . XARELTO 20 MG TABS tablet TAKE 1 TABLET BY MOUTH  DAILY WITH SUPPER 90 tablet 1  . metoprolol succinate (TOPROL XL) 25 MG 24 hr tablet Take 1 tablet (25 mg total) by mouth daily. 30 tablet 0   No current facility-administered medications for this visit.     Social History   Social History  . Marital status: Married    Spouse name: N/A  . Number of children: 2  . Years of education: college   Occupational History  . retitred    Social History Main Topics  . Smoking status: Never Smoker  . Smokeless tobacco: Never Used  . Alcohol use No  . Drug use: No  . Sexual activity: No   Other Topics Concern  . Not on file   Social History Narrative  . No narrative on file    Family History  Problem Relation Age of Onset  . CVA Father   . Heart attack Maternal Grandmother   . Heart attack Maternal Grandfather   . Heart murmur Sister   . Hypertension Son    Socially he is married has 2 children 4 grandchildren. He now exercises fairly regularly. There is no tobacco or alcohol use.   ROS General: Negative; No fevers, chills, or night sweats;  HEENT: Negative; No changes in vision or hearing, sinus congestion, difficulty swallowing Pulmonary: Negative; No cough, wheezing, shortness of breath, hemoptysis Cardiovascular: see history of present illness Positive for intermittent edema GI: Positive for hemorrhoids and he status post hemorrhoidal banding;  No nausea, vomiting, diarrhea, or abdominal pain GU: Negative; No dysuria, hematuria, or difficulty voiding Musculoskeletal: Positive for gout;  no myalgias,  or weakness Hematologic/Oncology: Negative; no easy bruising, bleeding Endocrine: Negative; no heat/cold intolerance; no diabetes Neuro: Positive for thrombotic stroke; no changes in balance, headaches Skin: Negative; No rashes or skin lesions Psychiatric: Negative; No behavioral problems, depression Sleep: Negative; No snoring, daytime sleepiness, hypersomnolence, bruxism, restless legs, hypnogognic hallucinations, no cataplexy Other comprehensive 14 point system review is negative.   PE BP (!) 147/78   Pulse 62   Ht _0  (1.753 m)   Wt 208 lb 3.2 oz (94.4 kg)   BMI 30.75 kg/m    Repeat blood pressure by me 132/78  Wt Readings from Last 3 Encounters:  04/26/17 208 lb 3.2 oz (94.4 kg)  02/20/17 208 lb (94.3 kg)  05/30/16 208 lb 6.4 oz (94.5 kg)   General: Alert, oriented, no distress.  Skin: normal turgor, no rashes, warm and dry HEENT: Normocephalic, atraumatic. Pupils equal round and reactive to light; sclera anicteric; extraocular muscles intact;  Nose without nasal septal hypertrophy Mouth/Parynx benign; Mallinpatti scale 3 Neck: No JVD, no carotid bruits; normal carotid upstroke Lungs: clear to ausculatation and percussion; no wheezing or rales Chest wall: without tenderness to palpitation Heart: PMI not displaced, RRR, s1 s2 normal, 1/6 systolic murmur, no diastolic murmur, no rubs, gallops, thrills, or heaves Abdomen: soft, nontender; no hepatosplenomehaly, BS+; abdominal aorta nontender and not dilated by palpation. Back: no CVA tenderness Pulses 2+ Musculoskeletal: full range of motion, normal strength, no joint deformities Extremities: Trace to 1+ edema lower extremity; no clubbing cyanosis or edema, Homan's sign negative  Neurologic: grossly nonfocal; Cranial nerves grossly wnl Psychologic: Normal mood and  affect   No ECG done today, but review of ECG at the time of his nuclear study on 02/20/2017 showed atrial fibrillation at 78 bpm  November 2017 ECG (independently read by me): Atrial fibrillation at 70 bpm.  Poor anterior R-wave progression.  QTc interval 475 ms.  November 2016 ECG (independently read by me): Atrial fibrillation at 67 bpm.  Nonspecific ST changes.  QTc interval normal at 443 ms.  May 2016 ECG (independently read by me): Atrial fibrillation with a controlled ventricular rate in the 60s.  December 2015 ECG (independently read by me): Atrial fibrillation with a ventricular rate at 84.  No significant ST-T changes  June 2015 ECG (independently read by me): Atrial fibrillation with a ventricular rate is 74.  Nonspecific ST changes.  10/09/2013 ECG (independently read by me): Atrial fibrillation with controlled ventricular response at 63 beats per minute.  Prior April 16 2013 ECG: Sinus bradycardia at 52 beats per minute.  LABS: BMP Latest Ref Rng & Units 12/08/2014 09/24/2013 09/23/2013  Glucose 70 - 99 mg/dL 116(H) 101(H) 174(H)  BUN 6 - 23 mg/dL _0 Creatinine 0.50 - 1.35 mg/dL 0.90 0.80 1.10  Sodium 135 - 145 mEq/L 142 144 144  Potassium 3.5 - 5.3 mEq/L 3.8 3.9 3.0(L)  Chloride 96 - 112 mEq/L 103 103 103  CO2 19 - 32 mEq/L 27 26 -  Calcium 8.4 - 10.5 mg/dL 9.4 9.2 -   Hepatic Function Latest Ref Rng & Units 12/08/2014 09/24/2013 09/23/2013  Total Protein 6.0 - 8.3 g/dL 7.1 6.7 7.2  Albumin 3.5 - 5.2 g/dL 4.1 3.3(L) 3.7  AST 0 - 37 U/L _1 ALT 0 - 53 U/L _2 Alk Phosphatase 39 - 117 U/L 65 54 61  Total Bilirubin 0.2 - 1.2 mg/dL 1.1 0.7 0.6   CBC Latest Ref Rng & Units 12/08/2014 09/24/2013 09/23/2013  WBC 4.0 - 10.5 K/uL 8.0 9.8 -  Hemoglobin 13.0 - 17.0 g/dL 15.4 14.7 16.3  Hematocrit 39.0 - 52.0 % 46.9 43.2 48.0  Platelets 150 - 400 K/uL 208 203 -   Lab Results  Component Value Date   MCV 88.2 12/08/2014   MCV 89.8 09/24/2013   MCV 89.7  09/23/2013   Lab Results  Component Value Date   TSH 2.031 12/08/2014   Lab Results  Component Value Date   HGBA1C 6.2 (H) 09/24/2013   Lipid Panel     Component Value Date/Time   CHOL 108 12/08/2014 0803   TRIG 106  12/08/2014 0803   HDL 41 12/08/2014 0803   CHOLHDL 2.6 12/08/2014 0803   VLDL 21 12/08/2014 0803   LDLCALC 46 12/08/2014 0803    IMPRESSION:  1. Coronary artery disease due to lipid rich plaque   2. S/P CABG x 4   3. Essential hypertension   4. Chronic atrial fibrillation (HCC)   5. Hyperlipidemia with target LDL less than 70   6. Lower extremity edema   7. Anticoagulation adequate     ASSESSMENT AND PLAN: Mr. Fairhurst is a 76 year-old white male who is status post CABG revascularization surgery for severe multivessel CAD in September 2012.  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  He has permanent atrial fibrillation with a controlled ventricular response for which he is entirely asymptomatic.  He is now on Xarelto anticoagulation therapy. He denies any bleeding. He is unaware of his heart rate being irregular.  His resting pulse is in the 60s.  I reviewed his nuclear stress test which continues to show normal perfusion without scar or ischemia.  There was some discordance to the EF on the nuclear study when compared to the echo.  The echo reveals an EF of 55% without wall motion abnormality.  He has severe LA dilatation, which undoubtedly is contributed by his atrial fibrillation.  He has recently seen a provider and pharmacist at [facility].  I reviewed laboratory from 02/18/2016 by Dr. Inda Merlin.  He had normal renal function.  Hemoglobin A1c was mildly increased at 6.2.  Lipid studies were excellent with a total cholesterol 117, triglycerides 111, HDL 42, LDL 53.  In attempt to sympathize medical regimen, we will discontinue metoprolol, tartrate, and in its place initiate  metoprolol succinate 25 mg daily.  If his heart rate increases significantly the dose will be titrated to 37.5 mg.  He has been caring for his daughter's dogs.  I have suggested support stockings with his intermittent edema.  If necessary, can take an extra Lasix 20 mg in the afternoon on an as-needed basis.  I will see him in the office in one year or sooner if problems arise.  Time spent: 25 minutes  Troy Sine, MD, [facility]  04/28/2017 1:28 PM

## 2017-04-26 NOTE — Patient Instructions (Signed)
Medication Instructions:  STOP metoprolol tartrate  START metoprolol succinate (Toprol XL) 25 mg daily  Take furosemide (Lasix) 20mg  daily -take extra 20mg  AS NEEDED for swelling  Follow-Up: Your physician wants you to follow-up in: 12 months with Dr. Claiborne Billings.  You will receive a reminder letter in the mail two months in advance. If you don't receive a letter, please call our office to schedule the follow-up appointment.   Any Other Special Instructions Will Be Listed Below (If Applicable).   How to Use Compression Stockings Compression stockings are elastic socks that squeeze the legs. They help to increase blood flow to the legs, decrease swelling in the legs, and reduce the chance of developing blood clots in the lower legs. Compression stockings are often used by people who:  Are recovering from surgery.  Have poor circulation in their legs.  Are prone to getting blood clots in their legs.  Have varicose veins.  Sit or stay in bed for long periods of time.  How to use compression stockings Before you put on your compression stockings:  Make sure that they are the correct size. If you do not know your size, ask your health care provider.  Make sure that they are clean, dry, and in good condition.  Check them for rips and tears. Do not put them on if they are ripped or torn.  Put your stockings on first thing in the morning, before you get out of bed. Keep them on for as long as your health care provider advises. When you are wearing your stockings:  Keep them as smooth as possible. Do not allow them to bunch up. It is especially important to prevent the stockings from bunching up around your toes or behind your knees.  Do not roll the stockings downward and leave them rolled down. This can decrease blood flow to your leg.  Change them right away if they become wet or dirty. 1.   When you take off your stockings, inspect your legs and feet. Anything that does not seem  normal may require medical attention. Look for:  Open sores.  Red spots.  Swelling.  Information and tips  Do not stop wearing your compression stockings without talking to your health care provider first.  Wash your stockings every day with mild detergent in cold or warm water. Do not use bleach. Air-dry your stockings or dry them in a clothes dryer on low heat.  Replace your stockings every 3-6 months.  If skin moisturizing is part of your treatment plan, apply lotion or cream at night so that your skin will be dry when you put on the stockings in the morning. It is harder to put the stockings on when you have lotion on your legs or feet. Contact a health care provider if: Remove your stockings and seek medical care if:  You have a feeling of pins and needles in your feet or legs.  You have any new changes in your skin.  You have skin lesions that are getting worse.  You have swelling or pain that is getting worse.  Get help right away if:  You have numbness or tingling in your lower legs that does not get better right after you take the stockings off.  Your toes or feet become cold and blue.  You develop open sores or red spots on your legs that do not go away.  You see or feel a warm spot on your leg.  You have new swelling or soreness  in your leg.  You are short of breath or you have chest pain for no reason.  You have a rapid or irregular heartbeat.  You feel light-headed or dizzy. This information is not intended to replace advice given to you by your health care provider. Make sure you discuss any questions you have with your health care provider. Document Released: 04/16/2009 Document Revised: 11/17/2015 Document Reviewed: 05/27/2014 Elsevier Interactive Patient Education  [employer].    If you need a refill on your cardiac medications before your next appointment, please call your pharmacy.

## 2017-05-18 ENCOUNTER — Telehealth: Payer: Self-pay | Admitting: Cardiovascular Disease

## 2017-05-18 MED ORDER — METOPROLOL SUCCINATE ER 25 MG PO TB24: 25.0000 mg | ORAL_TABLET | Freq: Every day | ORAL | 3 refills | Status: DC

## 2017-05-18 NOTE — Telephone Encounter (Signed)
Shane Espinoza is calling back so that the medication( Metoprolol ER Succinate 25mg  Tablet )  can be filled for a 90 day supply to [facility]

## 2017-05-18 NOTE — Telephone Encounter (Signed)
RX sent to pharmacy. Wife aware and verbalized understanding.

## 2017-05-28 ENCOUNTER — Other Ambulatory Visit: Payer: Self-pay | Admitting: *Deleted

## 2017-05-28 ENCOUNTER — Telehealth: Payer: Self-pay | Admitting: Cardiovascular Disease

## 2017-05-28 MED ORDER — METOPROLOL SUCCINATE ER 25 MG PO TB24: 25.0000 mg | ORAL_TABLET | Freq: Every day | ORAL | 3 refills | Status: DC

## 2017-05-28 NOTE — Telephone Encounter (Signed)
Returned call to [facility] on medication changes and pharmacy tech voiced understanding.

## 2017-05-28 NOTE — Telephone Encounter (Signed)
New message    Clarification needing on metoprolol succinate (TOPROL XL) 25 MG 24 hr tablet Ref # [id number].  Please call  Pt c/o medication issue:  1. Name of Medication: Metoprolol  2. How are you currently taking this medication (dosage and times per day)? As prescribed 3. Are you having a reaction (difficulty breathing--STAT)? No  4. What is your medication issue? [facility] state they have 2 prescriptions, one for Metoprolol succinate and one for metoprolol titrate. Clarification needed.

## 2017-05-29 ENCOUNTER — Other Ambulatory Visit: Payer: Self-pay | Admitting: Cardiovascular Disease

## 2017-05-30 ENCOUNTER — Other Ambulatory Visit: Payer: Self-pay | Admitting: Cardiovascular Disease

## 2017-05-30 ENCOUNTER — Other Ambulatory Visit: Payer: Self-pay | Admitting: *Deleted

## 2017-05-30 ENCOUNTER — Telehealth: Payer: Self-pay | Admitting: *Deleted

## 2017-05-30 MED ORDER — METOPROLOL SUCCINATE ER 25 MG PO TB24: 25.0000 mg | ORAL_TABLET | Freq: Every day | ORAL | 3 refills | Status: DC

## 2017-05-30 NOTE — Telephone Encounter (Signed)
Patients wife left a msg on the refill vm requesting a refill on metoprolol be sent to [facility] on [address]. She requested that this be expedited. Thanks, MI

## 2017-06-04 ENCOUNTER — Other Ambulatory Visit: Payer: Self-pay | Admitting: Cardiovascular Disease

## 2017-06-19 ENCOUNTER — Ambulatory Visit (INDEPENDENT_AMBULATORY_CARE_PROVIDER_SITE_OTHER): Payer: Medicare Other | Admitting: Ophthalmology

## 2017-06-19 DIAGNOSIS — H2512 Age-related nuclear cataract, left eye: Secondary | ICD-10-CM

## 2017-06-19 DIAGNOSIS — H43812 Vitreous degeneration, left eye: Secondary | ICD-10-CM | POA: Diagnosis not present

## 2017-06-19 DIAGNOSIS — H35372 Puckering of macula, left eye: Secondary | ICD-10-CM | POA: Diagnosis not present

## 2017-06-19 DIAGNOSIS — H35033 Hypertensive retinopathy, bilateral: Secondary | ICD-10-CM | POA: Diagnosis not present

## 2017-06-19 DIAGNOSIS — H35343 Macular cyst, hole, or pseudohole, bilateral: Secondary | ICD-10-CM

## 2017-06-19 DIAGNOSIS — I10 Essential (primary) hypertension: Secondary | ICD-10-CM | POA: Diagnosis not present

## 2017-07-14 ENCOUNTER — Other Ambulatory Visit: Payer: Self-pay | Admitting: Cardiovascular Disease

## 2017-07-26 ENCOUNTER — Other Ambulatory Visit: Payer: Self-pay | Admitting: *Deleted

## 2017-07-26 MED ORDER — SIMVASTATIN 20 MG PO TABS: 20.0000 mg | ORAL_TABLET | Freq: Every day | ORAL | 2 refills | Status: DC

## 2017-07-26 MED ORDER — IRBESARTAN 300 MG PO TABS: 300.0000 mg | ORAL_TABLET | Freq: Every day | ORAL | 2 refills | Status: DC

## 2017-07-26 MED ORDER — METOPROLOL SUCCINATE ER 25 MG PO TB24: 25.0000 mg | ORAL_TABLET | Freq: Every day | ORAL | 2 refills | Status: DC

## 2017-07-26 MED ORDER — RIVAROXABAN 20 MG PO TABS: ORAL_TABLET | ORAL | 2 refills | Status: DC

## 2017-07-26 MED ORDER — AMLODIPINE BESYLATE 5 MG PO TABS: 5.0000 mg | ORAL_TABLET | Freq: Every day | ORAL | 2 refills | Status: DC

## 2017-07-26 MED ORDER — FUROSEMIDE 20 MG PO TABS: ORAL_TABLET | ORAL | 2 refills | Status: DC

## 2017-08-01 DIAGNOSIS — E782 Mixed hyperlipidemia: Secondary | ICD-10-CM | POA: Diagnosis not present

## 2017-08-01 DIAGNOSIS — I6350 Cerebral infarction due to unspecified occlusion or stenosis of unspecified cerebral artery: Secondary | ICD-10-CM | POA: Diagnosis not present

## 2017-08-01 DIAGNOSIS — I10 Essential (primary) hypertension: Secondary | ICD-10-CM | POA: Diagnosis not present

## 2017-08-01 DIAGNOSIS — I4891 Unspecified atrial fibrillation: Secondary | ICD-10-CM | POA: Diagnosis not present

## 2017-08-01 DIAGNOSIS — I2510 Atherosclerotic heart disease of native coronary artery without angina pectoris: Secondary | ICD-10-CM | POA: Diagnosis not present

## 2017-08-06 ENCOUNTER — Other Ambulatory Visit: Payer: Self-pay | Admitting: Cardiovascular Disease

## 2017-08-06 NOTE — Telephone Encounter (Signed)
Rx request sent to pharmacy.  

## 2017-09-10 NOTE — Progress Notes (Signed)
Shane Espinoza and his wife came into my office for manual BP checks and we started talking about regular exercise, including strength training and flexibility along with the cardio training they are doing.  They are interested in the Provider Referral Exercise Program (PREP) and doing it together.  They will consult their PMD about having the referral form signed.

## 2017-09-17 DIAGNOSIS — I110 Hypertensive heart disease with heart failure: Secondary | ICD-10-CM | POA: Diagnosis not present

## 2017-09-17 DIAGNOSIS — Z6832 Body mass index (BMI) 32.0-32.9, adult: Secondary | ICD-10-CM | POA: Diagnosis not present

## 2017-09-17 DIAGNOSIS — Z7901 Long term (current) use of anticoagulants: Secondary | ICD-10-CM | POA: Diagnosis not present

## 2017-09-17 DIAGNOSIS — M109 Gout, unspecified: Secondary | ICD-10-CM | POA: Diagnosis not present

## 2017-09-17 DIAGNOSIS — I509 Heart failure, unspecified: Secondary | ICD-10-CM | POA: Diagnosis not present

## 2017-09-17 DIAGNOSIS — I2510 Atherosclerotic heart disease of native coronary artery without angina pectoris: Secondary | ICD-10-CM | POA: Diagnosis not present

## 2017-09-17 DIAGNOSIS — I4891 Unspecified atrial fibrillation: Secondary | ICD-10-CM | POA: Diagnosis not present

## 2017-09-17 DIAGNOSIS — E785 Hyperlipidemia, unspecified: Secondary | ICD-10-CM | POA: Diagnosis not present

## 2017-09-17 DIAGNOSIS — H547 Unspecified visual loss: Secondary | ICD-10-CM | POA: Diagnosis not present

## 2017-09-17 DIAGNOSIS — E669 Obesity, unspecified: Secondary | ICD-10-CM | POA: Diagnosis not present

## 2017-10-30 DIAGNOSIS — R69 Illness, unspecified: Secondary | ICD-10-CM | POA: Diagnosis not present

## 2017-11-05 DIAGNOSIS — I2510 Atherosclerotic heart disease of native coronary artery without angina pectoris: Secondary | ICD-10-CM | POA: Diagnosis not present

## 2017-11-05 DIAGNOSIS — I4891 Unspecified atrial fibrillation: Secondary | ICD-10-CM | POA: Diagnosis not present

## 2017-11-05 DIAGNOSIS — I6350 Cerebral infarction due to unspecified occlusion or stenosis of unspecified cerebral artery: Secondary | ICD-10-CM | POA: Diagnosis not present

## 2017-11-05 DIAGNOSIS — E782 Mixed hyperlipidemia: Secondary | ICD-10-CM | POA: Diagnosis not present

## 2017-11-05 DIAGNOSIS — I10 Essential (primary) hypertension: Secondary | ICD-10-CM | POA: Diagnosis not present

## 2018-01-01 ENCOUNTER — Telehealth: Payer: Self-pay | Admitting: Cardiology

## 2018-01-01 NOTE — Telephone Encounter (Signed)
New Message     Pt c/o medication issue: 1. Name of Medication:  irbesartan (AVAPRO) 300 MG tablet  2. How are you currently taking this medication (dosage and times per day)? TAKE 1 TABLET BY MOUTH DAILY  3. Are you having a reaction (difficulty breathing--STAT)No   4. What is your medication issue? Medication on back order

## 2018-01-01 NOTE — Telephone Encounter (Signed)
Would prefer to go back to valsartan 320 mg.  Looks like he was on prior to original recall

## 2018-01-01 NOTE — Telephone Encounter (Signed)
FORWARD  TO [facility]

## 2018-01-02 MED ORDER — VALSARTAN 320 MG PO TABS: 320.0000 mg | ORAL_TABLET | Freq: Every day | ORAL | 3 refills | Status: DC

## 2018-01-02 NOTE — Telephone Encounter (Signed)
SPOKE TO PHARMACY TECH- PATRICIA INFORMATION GIVEN TO SWITCH FROM IRBESARTAN TO VALSARTAN  UNABLE TO CHANGE BY PHONE DUE THE RX PLACED BACK .  [place] INTO PHARMACY

## 2018-01-02 NOTE — Telephone Encounter (Signed)
PATIENT AWARE.  PATIENT WANTED TO SCHEDULE ANNUAL APPOINTMENT- APPOINTMENT SCHEDULE FOR Jun 04, 2018 AT 8 AM .

## 2018-01-10 DIAGNOSIS — H5202 Hypermetropia, left eye: Secondary | ICD-10-CM | POA: Diagnosis not present

## 2018-01-10 DIAGNOSIS — H35373 Puckering of macula, bilateral: Secondary | ICD-10-CM | POA: Diagnosis not present

## 2018-01-10 DIAGNOSIS — H2512 Age-related nuclear cataract, left eye: Secondary | ICD-10-CM | POA: Diagnosis not present

## 2018-01-10 DIAGNOSIS — Z961 Presence of intraocular lens: Secondary | ICD-10-CM | POA: Diagnosis not present

## 2018-03-01 DIAGNOSIS — E782 Mixed hyperlipidemia: Secondary | ICD-10-CM | POA: Diagnosis not present

## 2018-03-01 DIAGNOSIS — I4891 Unspecified atrial fibrillation: Secondary | ICD-10-CM | POA: Diagnosis not present

## 2018-03-01 DIAGNOSIS — Z79899 Other long term (current) drug therapy: Secondary | ICD-10-CM | POA: Diagnosis not present

## 2018-03-01 DIAGNOSIS — I10 Essential (primary) hypertension: Secondary | ICD-10-CM | POA: Diagnosis not present

## 2018-03-01 DIAGNOSIS — Z1389 Encounter for screening for other disorder: Secondary | ICD-10-CM | POA: Diagnosis not present

## 2018-03-01 DIAGNOSIS — I2510 Atherosclerotic heart disease of native coronary artery without angina pectoris: Secondary | ICD-10-CM | POA: Diagnosis not present

## 2018-03-01 DIAGNOSIS — I6350 Cerebral infarction due to unspecified occlusion or stenosis of unspecified cerebral artery: Secondary | ICD-10-CM | POA: Diagnosis not present

## 2018-03-01 DIAGNOSIS — Z0000 Encounter for general adult medical examination without abnormal findings: Secondary | ICD-10-CM | POA: Diagnosis not present

## 2018-03-01 DIAGNOSIS — M109 Gout, unspecified: Secondary | ICD-10-CM | POA: Diagnosis not present

## 2018-03-01 DIAGNOSIS — E559 Vitamin D deficiency, unspecified: Secondary | ICD-10-CM | POA: Diagnosis not present

## 2018-04-02 ENCOUNTER — Telehealth: Payer: Self-pay | Admitting: Cardiovascular Disease

## 2018-04-02 MED ORDER — FUROSEMIDE 20 MG PO TABS: ORAL_TABLET | ORAL | 3 refills | Status: DC

## 2018-04-02 NOTE — Telephone Encounter (Signed)
New Message          *STAT* If patient is at the pharmacy, call can be transferred to refill team.   1. Which medications need to be refilled? (please list name of each medication and dose if known) furosemide (LASIX) 20 MG tablet  2. Which pharmacy/location (including street and city if local pharmacy) is medication to be sent to? [facility] [place] [place] [facility] [phone] (Phone) [phone] (Fax)     3. Do they need a 30 day or 90 day supply? 2   Patient's wife called today to get extra tablets due to patient having extra swelling. Per the Rx...Marland Kitchen(Rx reads take 1 a day but take an extra if you notice swelling.) There has been extra swelling.

## 2018-04-02 NOTE — Telephone Encounter (Signed)
PT'S WIFE AWARE SCRIPT  SENT VIA [facility] TO [facility] FOR 120 TABS WITH 3 REFILLS fUROSEMIDE 20 MG ./CY

## 2018-04-29 ENCOUNTER — Other Ambulatory Visit: Payer: Self-pay | Admitting: Cardiovascular Disease

## 2018-05-01 DIAGNOSIS — D1801 Hemangioma of skin and subcutaneous tissue: Secondary | ICD-10-CM | POA: Diagnosis not present

## 2018-05-01 DIAGNOSIS — Z23 Encounter for immunization: Secondary | ICD-10-CM | POA: Diagnosis not present

## 2018-05-01 DIAGNOSIS — D2371 Other benign neoplasm of skin of right lower limb, including hip: Secondary | ICD-10-CM | POA: Diagnosis not present

## 2018-05-01 DIAGNOSIS — I788 Other diseases of capillaries: Secondary | ICD-10-CM | POA: Diagnosis not present

## 2018-05-01 DIAGNOSIS — Z85828 Personal history of other malignant neoplasm of skin: Secondary | ICD-10-CM | POA: Diagnosis not present

## 2018-05-01 DIAGNOSIS — I4891 Unspecified atrial fibrillation: Secondary | ICD-10-CM | POA: Diagnosis not present

## 2018-05-01 DIAGNOSIS — I10 Essential (primary) hypertension: Secondary | ICD-10-CM | POA: Diagnosis not present

## 2018-05-01 DIAGNOSIS — L821 Other seborrheic keratosis: Secondary | ICD-10-CM | POA: Diagnosis not present

## 2018-05-01 DIAGNOSIS — L570 Actinic keratosis: Secondary | ICD-10-CM | POA: Diagnosis not present

## 2018-05-01 DIAGNOSIS — I6350 Cerebral infarction due to unspecified occlusion or stenosis of unspecified cerebral artery: Secondary | ICD-10-CM | POA: Diagnosis not present

## 2018-05-01 DIAGNOSIS — I2510 Atherosclerotic heart disease of native coronary artery without angina pectoris: Secondary | ICD-10-CM | POA: Diagnosis not present

## 2018-05-01 DIAGNOSIS — E782 Mixed hyperlipidemia: Secondary | ICD-10-CM | POA: Diagnosis not present

## 2018-05-08 DIAGNOSIS — R69 Illness, unspecified: Secondary | ICD-10-CM | POA: Diagnosis not present

## 2018-05-09 DIAGNOSIS — M1711 Unilateral primary osteoarthritis, right knee: Secondary | ICD-10-CM | POA: Diagnosis not present

## 2018-06-04 ENCOUNTER — Ambulatory Visit: Payer: Medicare HMO | Admitting: Cardiovascular Disease

## 2018-06-04 ENCOUNTER — Encounter: Payer: Self-pay | Admitting: Cardiovascular Disease

## 2018-06-04 VITALS — BP 163/85 | HR 76 | Ht 69.0 in | Wt 216.4 lb

## 2018-06-04 DIAGNOSIS — E785 Hyperlipidemia, unspecified: Secondary | ICD-10-CM

## 2018-06-04 DIAGNOSIS — I10 Essential (primary) hypertension: Secondary | ICD-10-CM

## 2018-06-04 DIAGNOSIS — I2583 Coronary atherosclerosis due to lipid rich plaque: Secondary | ICD-10-CM | POA: Diagnosis not present

## 2018-06-04 DIAGNOSIS — I482 Chronic atrial fibrillation, unspecified: Secondary | ICD-10-CM | POA: Diagnosis not present

## 2018-06-04 DIAGNOSIS — Z131 Encounter for screening for diabetes mellitus: Secondary | ICD-10-CM | POA: Diagnosis not present

## 2018-06-04 DIAGNOSIS — R600 Localized edema: Secondary | ICD-10-CM

## 2018-06-04 DIAGNOSIS — I2510 Atherosclerotic heart disease of native coronary artery without angina pectoris: Secondary | ICD-10-CM | POA: Diagnosis not present

## 2018-06-04 DIAGNOSIS — Z951 Presence of aortocoronary bypass graft: Secondary | ICD-10-CM | POA: Diagnosis not present

## 2018-06-04 DIAGNOSIS — Z7901 Long term (current) use of anticoagulants: Secondary | ICD-10-CM | POA: Diagnosis not present

## 2018-06-04 MED ORDER — SPIRONOLACTONE 25 MG PO TABS: 12.5000 mg | ORAL_TABLET | Freq: Every day | ORAL | 3 refills | Status: DC

## 2018-06-04 NOTE — Patient Instructions (Signed)
Medication Instructions:  START spironolactone 12.5 mg (1/2 tablet) daily  If you need a refill on your cardiac medications before your next appointment, please call your pharmacy.   Lab work: Please return for labs in 2 weeks (BMET, HmgA1C)  Our in office lab hours are Monday-Friday 8:00-4:00, closed for lunch 12:45-1:45 pm.  No appointment needed.  If you have labs (blood work) drawn today and your tests are completely normal, you will receive your results only by: Marland Kitchen MyChart Message (if you have MyChart) OR . A paper copy in the mail If you have any lab test that is abnormal or we need to change your treatment, we will call you to review the results.  Testing/Procedures: Your physician has requested that you have an echocardiogram in 1 YEAR. Echocardiography is a painless test that uses sound waves to create images of your heart. It provides your doctor with information about the size and shape of your heart and how well your heart's chambers and valves are working. This procedure takes approximately one hour. There are no restrictions for this procedure.  Follow-Up: At [facility], you and your health needs are our priority.  As part of our continuing mission to provide you with exceptional heart care, we have created designated Provider Care Teams.  These Care Teams include your primary Cardiologist (physician) and Advanced Practice Providers (APPs -  Physician Assistants and Nurse Practitioners) who all work together to provide you with the care you need, when you need it. You will need a follow up appointment in 12 months.  Please call our office 2 months in advance to schedule this appointment.  You may see Dr. Claiborne Billings or one of the following Advanced Practice Providers on your designated Care Team: [place], [place] . Fabian Sharp, PA-C

## 2018-06-04 NOTE — Progress Notes (Signed)
Patient ID: Shane Espinoza, male   DOB: [birth year], 77 y.o.   MRN: [id number]      HPI: Shane Espinoza is a 77 y.o. male who presents to the office for a 14 month cardiology evaluation.    Shane Espinoza has established CAD and underwent CABG revascularization surgery by Dr. Nils Pyle on 03/07/2011 after a nuclear perfusion study revealed significant scar/ischemia and cardiac catheterization demonstrated severe multivessel CAD. He had aLIMA to the LAD, vein to the OM, vein to the RV marginal, and vein to the PDA. He  developed PAF postoperatively and underwent TEE guided cardioversion with restoration of sinus rhythm. He completed cardiac rehabilitation. A nuclear perfusion study in August 2013 was markedly improved did not reveal any region of scar or ischemia. He has been off amiodarone and maintaining sinus rhythm. Additional problems include hypertension, hyperlipidemia, and intermittent lower extremity edema.  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  An MRI of his brain showed a tiny left operculum, hemorrhage, infarct, is concerned that potentially this was thrombotic.  He did not have any loss of function of the upper or lower extremities and denied any headache, dizziness, visual symptoms, or difficulty swallowing.  His ECG at that time did suggest sinus rhythm.  He now is on anticoagulation with Xarelto 20 mg. His head CT was negative as was his MRA of the large and medium size vessels.  His carotids were widely patent.  He denies chest pain.  He denies any significant neurologic sequelae.  He is now in permanent atrial fibrillation on chronic anticoagulation with Xarelto.  He has had episodes of recurrent gout.  I had taken him off HCTZ since this could be contributing to elevation of uric acid.  He is now on low-dose chronic alliupurinol and since instituting therapy has not had any recurrent gout episodes.   When I last saw him in October 2018 he  denied any episodes of chest pain, palpitations, presyncope or syncope.  He underwent a nuclear stress test on 02/20/2017 which showed an EF of 47% with mild diffuse hypokinesis.  Is no evidence for scar or ischemia.  Because of the slightly reduced EF.  He underwent a echo Doppler study on 03/14/2017.  This showed an estimated EF at 55% without regional wall motion abnormalities.  His left atrium was severely dilated.  Contributed by his atrial fibrillation.  He has had issues with some intermittent leg swelling and was on amlodipine at 2.5 mg, which was reduced because of edema, furosemide 20 mg 5 days per week, irbesartan 300 mg and metoprolol, tartrate 25 mg in the morning and 12.5 mmol at night.  He is on simvastatin for hyperlipidemia and Xarelto for anticoagulation.    Over the past year, he has remained relatively stable from a cardiac standpoint.  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  He does use a recumbent bike at the [facility].  He underwent laboratory by Dr. Inda Merlin September 2019 which showed a glucose of 134.  Renal function was normal.  Lipid studies are excellent with a total cholesterol 111 HDL 37 LDL 53 and triglycerides 105.  Creatinine was 0.99.  Hemoglobin was 15.  He has been wearing support stockings 15 to 20 mm which has been helpful for his leg swelling.  He states his blood pressure typically runs in the 140s to 150s.  His pulse typically runs in the 50s to 60s and  occasionally in the 50s.  He denies anginal symptomatology, PND orthopnea.  He presents for a year evaluation.   Past Medical History:  Diagnosis Date  . Anxiety   . Atrial fibrillation ([place])   . CAD (coronary artery disease)    Shane Espinoza  . Gout   . Hypertension   . 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 CARDIAC CATHETERIZATION  03/02/2011   Recommended CABG  . COLON SURGERY    . CORONARY  ARTERY BYPASS GRAFT  03/07/2011   DR.VAN  Espinoza  . EYE SURGERY    . LEXISCAN MYOVIEW  02/20/2012   EKG negative for ischemia, noraml study  . TRANSTHORACIC ECHOCARDIOGRAM  02/20/2012   EF 50-55%, mild-moderate concentric LVH, LA moderately dilated, moderate mitral regurg  . VASCULAR DOPPLER  03/03/2011   Nosignificant extracranial carotid artery stenosis    No Known Allergies  Current Outpatient Medications  Medication Sig Dispense Refill  . allopurinol (ZYLOPRIM) 100 MG tablet Take 1 tablet by mouth daily.  2  . amLODipine (NORVASC) 2.5 MG tablet Take 2.5 mg by mouth daily.    . beta carotene w/minerals (OCUVITE) tablet Take 1 tablet by mouth daily.      . Cholecalciferol (VITAMIN D3) 5000 UNITS CAPS Take by mouth. Monday, Wednesday, Friday    . colchicine 0.6 MG tablet Take 0.6 mg by mouth as needed.    . fish oil-omega-3 fatty acids 1000 MG capsule Take 500 mg by mouth daily.     . furosemide (LASIX) 20 MG tablet TAKE 1 TABLET DAILY, take extra 4m AS NEEDED for swelling 120 tablet 3  . imiquimod (ALDARA) 5 % cream Apply 1 application topically as directed.    . metoprolol succinate (TOPROL-XL) 25 MG 24 hr tablet Take 1 tablet (25 mg total) by mouth daily. KEEP OV. 90 tablet 0  . rivaroxaban (XARELTO) 20 MG TABS tablet TAKE 1 TABLET BY MOUTH  DAILY WITH SUPPER 90 tablet 2  . simvastatin (ZOCOR) 20 MG tablet Take 1 tablet (20 mg total) by mouth at bedtime. 90 tablet 2  . valsartan (DIOVAN) 320 MG tablet Take 1 tablet (320 mg total) by mouth daily. 90 tablet 3  . spironolactone (ALDACTONE) 25 MG tablet Take 0.5 tablets (12.5 mg total) by mouth daily. 45 tablet 3   No current facility-administered medications for this visit.     Social History   Socioeconomic History  . Marital status: Married    Spouse name: Not on file  . Number of children: 2  . Years of education: college  . Highest education level: Not on file  Occupational History  . Occupation: retitred  Social Needs    . Financial resource strain: Not on file  . Food insecurity:    Worry: Not on file    Inability: Not on file  . Transportation needs:    Medical: Not on file    Non-medical: Not on file  Tobacco Use  . Smoking status: Never Smoker  . Smokeless tobacco: Never Used  Substance and Sexual Activity  . Alcohol use: No  . Drug use: No  . Sexual activity: Never  Lifestyle  . Physical activity:    Days per week: Not on file    Minutes per session: Not on file  . Stress: Not on file  Relationships  . Social connections:    Talks on phone: Not on file    Gets together:  Not on file    Attends religious service: Not on file    Active member of club or organization: Not on file    Attends meetings of clubs or organizations: Not on file    Relationship status: Not on file  . Intimate partner violence:    Fear of current or ex partner: Not on file    Emotionally abused: Not on file    Physically abused: Not on file    Forced sexual activity: Not on file  Other Topics Concern  . Not on file  Social History Narrative  . Not on file    Family History  Problem Relation Age of Onset  . CVA Father   . Heart attack Maternal Grandmother   . Heart attack Maternal Grandfather   . Heart murmur Sister   . Hypertension Son    Socially he is married has 2 children 4 grandchildren. He now exercises fairly regularly. There is no tobacco or alcohol use.   ROS General: Negative; No fevers, chills, or night sweats;  HEENT: Negative; No changes in vision or hearing, sinus congestion, difficulty swallowing Pulmonary: Negative; No cough, wheezing, shortness of breath, hemoptysis Cardiovascular: see history of present illness Positive for intermittent edema GI: Positive for hemorrhoids and he status post hemorrhoidal banding; No nausea, vomiting, diarrhea, or abdominal pain GU: Negative; No dysuria, hematuria, or difficulty voiding Musculoskeletal: Positive for gout;  no myalgias,  or  weakness Hematologic/Oncology: Negative; no easy bruising, bleeding Endocrine: Negative; no heat/cold intolerance; no diabetes Neuro: Positive for thrombotic stroke; no changes in balance, headaches Skin: Negative; No rashes or skin lesions Psychiatric: Negative; No behavioral problems, depression Sleep: Negative; No snoring, daytime sleepiness, hypersomnolence, bruxism, restless legs, hypnogognic hallucinations, no cataplexy Other comprehensive 14 point system review is negative.   PE BP (!) 163/85   Pulse 76   Ht _0  (1.753 m)   Wt 216 lb 6.4 oz (98.2 kg)   BMI 31.96 kg/m    Repeat blood pressure by me was 148/86  Wt Readings from Last 3 Encounters:  06/04/18 216 lb 6.4 oz (98.2 kg)  04/26/17 208 lb 3.2 oz (94.4 kg)  02/20/17 208 lb (94.3 kg)   General: Alert, oriented, no distress.  Skin: normal turgor, no rashes, warm and dry HEENT: Normocephalic, atraumatic. Pupils equal round and reactive to light; sclera anicteric; extraocular muscles intact;  Nose without nasal septal hypertrophy Mouth/Parynx benign; Mallinpatti scale 3 Neck: No JVD, no carotid bruits; normal carotid upstroke Lungs: clear to ausculatation and percussion; no wheezing or rales Chest wall: without tenderness to palpitation Heart: PMI not displaced, RRR, s1 s2 normal, 1/6 systolic murmur, no diastolic murmur, no rubs, gallops, thrills, or heaves Abdomen: soft, nontender; no hepatosplenomehaly, BS+; abdominal aorta nontender and not dilated by palpation. Back: no CVA tenderness Pulses 2+ Musculoskeletal: full range of motion, normal strength, no joint deformities Extremities: Trace ankle/pretibial edema, no clubbing cyanosis, Homan's sign negative  Neurologic: grossly nonfocal; Cranial nerves grossly wnl Psychologic: Normal mood and affect   ECG (independently read by me): Atrial fibrillation at 76 bpm QTc interval 443 ms.  No ECG done today, but review of ECG at the time of his nuclear study on  02/20/2017 showed atrial fibrillation at 78 bpm  November 2017 ECG (independently read by me): Atrial fibrillation at 70 bpm.  Poor anterior R-wave progression.  QTc interval 475 ms.  November 2016 ECG (independently read by me): Atrial fibrillation at 67 bpm.  Nonspecific ST changes.  QTc interval normal  at 443 ms.  May 2016 ECG (independently read by me): Atrial fibrillation with a controlled ventricular rate in the 60s.  December 2015 ECG (independently read by me): Atrial fibrillation with a ventricular rate at 84.  No significant ST-T changes  June 2015 ECG (independently read by me): Atrial fibrillation with a ventricular rate is 74.  Nonspecific ST changes.  10/09/2013 ECG (independently read by me): Atrial fibrillation with controlled ventricular response at 63 beats per minute.  Prior April 16 2013 ECG: Sinus bradycardia at 52 beats per minute.  LABS: BMP Latest Ref Rng & Units 12/08/2014 09/24/2013 09/23/2013  Glucose 70 - 99 mg/dL 116(H) 101(H) 174(H)  BUN 6 - 23 mg/dL _0 Creatinine 0.50 - 1.35 mg/dL 0.90 0.80 1.10  Sodium 135 - 145 mEq/L 142 144 144  Potassium 3.5 - 5.3 mEq/L 3.8 3.9 3.0(L)  Chloride 96 - 112 mEq/L 103 103 103  CO2 19 - 32 mEq/L 27 26 -  Calcium 8.4 - 10.5 mg/dL 9.4 9.2 -   Hepatic Function Latest Ref Rng & Units 12/08/2014 09/24/2013 09/23/2013  Total Protein 6.0 - 8.3 g/dL 7.1 6.7 7.2  Albumin 3.5 - 5.2 g/dL 4.1 3.3(L) 3.7  AST 0 - 37 U/L _1 ALT 0 - 53 U/L _2 Alk Phosphatase 39 - 117 U/L 65 54 61  Total Bilirubin 0.2 - 1.2 mg/dL 1.1 0.7 0.6   CBC Latest Ref Rng & Units 12/08/2014 09/24/2013 09/23/2013  WBC 4.0 - 10.5 K/uL 8.0 9.8 -  Hemoglobin 13.0 - 17.0 g/dL 15.4 14.7 16.3  Hematocrit 39.0 - 52.0 % 46.9 43.2 48.0  Platelets 150 - 400 K/uL 208 203 -   Lab Results  Component Value Date   MCV 88.2 12/08/2014   MCV 89.8 09/24/2013   MCV 89.7 09/23/2013   Lab Results  Component Value Date   TSH 2.031 12/08/2014   Lab Results   Component Value Date   HGBA1C 6.2 (H) 09/24/2013   Lipid Panel     Component Value Date/Time   CHOL 108 12/08/2014 0803   TRIG 106 12/08/2014 0803   HDL 41 12/08/2014 0803   CHOLHDL 2.6 12/08/2014 0803   VLDL 21 12/08/2014 0803   LDLCALC 46 12/08/2014 0803    IMPRESSION:  1. Coronary artery disease due to lipid rich plaque   2. S/P CABG x 4   3. Essential hypertension   4. Chronic atrial fibrillation   5. Screening for diabetes mellitus (DM)   6. Hyperlipidemia with target LDL less than 70   7. Lower extremity edema   8. Anticoagulation adequate     ASSESSMENT AND PLAN: Shane Espinoza is a 77year-old white male who underwent CABG revascularization surgery for severe multivessel CAD in September 2012.  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  He has permanent atrial fibrillation with a controlled ventricular response for which he is entirely asymptomatic.  He is now on Xarelto anticoagulation therapy. He denies any bleeding. He is unaware of his heart rate being irregular. His last nuclear stress test in August 2018 continues to show normal perfusion without scar or ischemia.  There was some discordance to the EF on the nuclear study when compared to the echo.  The echo reveals an EF of 55% without wall motion abnormality, severe LA dilatation, which undoubtedly is contributed by his atrial fibrillation.  Blood pressure today  is elevated and he states this is consistently been in the 1 40-1 50 range.  Most recently he is taking valsartan 320 mg, Toprol-XL 25 mg, Lasix 20 mg daily and a reduced dose of amlodipine at 2.5 mg.  In the past he developed leg edema and his amlodipine dose was reduced by Dr. Huston Foley.  I have recommended the addition of spironolactone 12.5 mg for aldosterone blockade which will be helpful both for blood pressure control as well as for additional reduction in edema  benefit.  His atrial fibrillation rate is controlled on his current dose of Toprol-XL 25 mg.  His lipid studies are excellent on his current dose of simvastatin and over-the-counter fish oil.  He has not had any gout episodes on his allopurinol and colchicine.  He is anticoagulated on Xarelto without bleeding.  In 2 weeks he will undergo a be met and I will also check a hemoglobin A1c since this was not checked by his primary physician over the past year.  He will follow-up with Dr. Inda Merlin as scheduled in 6 months.  I will see him in 1 year for reevaluation and prior to that assessment he will undergo a 2-year follow-up echo Doppler study.  I spent: 25 minutes  Shane Sine, MD, [facility]  06/04/2018 8:41 AM

## 2018-06-04 NOTE — Addendum Note (Signed)
Addended by: Patria Mane A on: 06/04/2018 09:31 AM   Modules accepted: Orders

## 2018-06-20 ENCOUNTER — Encounter (INDEPENDENT_AMBULATORY_CARE_PROVIDER_SITE_OTHER): Payer: Medicare HMO | Admitting: Ophthalmology

## 2018-06-20 DIAGNOSIS — I10 Essential (primary) hypertension: Secondary | ICD-10-CM | POA: Diagnosis not present

## 2018-06-20 DIAGNOSIS — H2512 Age-related nuclear cataract, left eye: Secondary | ICD-10-CM | POA: Diagnosis not present

## 2018-06-20 DIAGNOSIS — H35341 Macular cyst, hole, or pseudohole, right eye: Secondary | ICD-10-CM

## 2018-06-20 DIAGNOSIS — H35372 Puckering of macula, left eye: Secondary | ICD-10-CM | POA: Diagnosis not present

## 2018-06-20 DIAGNOSIS — H43813 Vitreous degeneration, bilateral: Secondary | ICD-10-CM | POA: Diagnosis not present

## 2018-06-20 DIAGNOSIS — H35033 Hypertensive retinopathy, bilateral: Secondary | ICD-10-CM | POA: Diagnosis not present

## 2018-06-24 DIAGNOSIS — I482 Chronic atrial fibrillation, unspecified: Secondary | ICD-10-CM | POA: Diagnosis not present

## 2018-06-24 DIAGNOSIS — E785 Hyperlipidemia, unspecified: Secondary | ICD-10-CM | POA: Diagnosis not present

## 2018-06-24 DIAGNOSIS — Z131 Encounter for screening for diabetes mellitus: Secondary | ICD-10-CM | POA: Diagnosis not present

## 2018-06-24 DIAGNOSIS — I10 Essential (primary) hypertension: Secondary | ICD-10-CM | POA: Diagnosis not present

## 2018-06-24 DIAGNOSIS — Z951 Presence of aortocoronary bypass graft: Secondary | ICD-10-CM | POA: Diagnosis not present

## 2018-06-24 DIAGNOSIS — I2510 Atherosclerotic heart disease of native coronary artery without angina pectoris: Secondary | ICD-10-CM | POA: Diagnosis not present

## 2018-06-24 DIAGNOSIS — I2583 Coronary atherosclerosis due to lipid rich plaque: Secondary | ICD-10-CM | POA: Diagnosis not present

## 2018-06-25 LAB — BASIC METABOLIC PANEL
BUN/Creatinine Ratio: 14 (ref 10–24)
BUN: 15 mg/dL (ref 8–27)
CALCIUM: 9.5 mg/dL (ref 8.6–10.2)
CO2: 25 mmol/L (ref 20–29)
CREATININE: 1.11 mg/dL (ref 0.76–1.27)
Chloride: 102 mmol/L (ref 96–106)
GFR calc Af Amer: 73 mL/min/{1.73_m2} (ref >59)
GFR, EST NON AFRICAN AMERICAN: 63 mL/min/{1.73_m2} (ref >59)
Glucose: 149 mg/dL — ABNORMAL HIGH (ref 65–99)
Potassium: 4.4 mmol/L (ref 3.5–5.2)
Sodium: 140 mmol/L (ref 134–144)

## 2018-06-25 LAB — HEMOGLOBIN A1C
Est. average glucose Bld gHb Est-mCnc: 146 mg/dL
Hgb A1c MFr Bld: 6.7 % — ABNORMAL HIGH (ref 4.8–5.6)

## 2018-07-01 ENCOUNTER — Telehealth: Payer: Self-pay | Admitting: Cardiovascular Disease

## 2018-07-01 NOTE — Telephone Encounter (Signed)
New Message        Patient returned your  Call and would like results left  On phone if he doesn't answer.

## 2018-07-01 NOTE — Telephone Encounter (Signed)
Patient was called back with results. See labs.

## 2018-07-04 ENCOUNTER — Other Ambulatory Visit: Payer: Self-pay | Admitting: Cardiovascular Disease

## 2018-07-04 MED ORDER — RIVAROXABAN 20 MG PO TABS: ORAL_TABLET | ORAL | 0 refills | Status: DC

## 2018-07-04 NOTE — Telephone Encounter (Signed)
°*  STAT* If patient is at the pharmacy, call can be transferred to refill team.   1. Which medications need to be refilled? (please list name of each medication and dose if known)  New prescription for Xarelo  2. Which pharmacy/location (including street and city if local pharmacy) is medication to be sent to? [facility]  3. Do they need a 30 day or 90 day supply? 90 and refills

## 2018-07-12 ENCOUNTER — Other Ambulatory Visit: Payer: Self-pay | Admitting: Cardiovascular Disease

## 2018-07-12 MED ORDER — SIMVASTATIN 20 MG PO TABS: 20.0000 mg | ORAL_TABLET | Freq: Every day | ORAL | 3 refills | Status: DC

## 2018-07-12 MED ORDER — RIVAROXABAN 20 MG PO TABS: ORAL_TABLET | ORAL | 1 refills | Status: DC

## 2018-07-12 MED ORDER — METOPROLOL SUCCINATE ER 25 MG PO TB24: 25.0000 mg | ORAL_TABLET | Freq: Every day | ORAL | 3 refills | Status: DC

## 2018-07-12 NOTE — Telephone Encounter (Signed)
New Message   Pt c/o medication issue:  1. Name of Medication: Xarelto  2. How are you currently taking this medication (dosage and times per day)? rivaroxaban (XARELTO) 20 MG TABS tablet  3. Are you having a reaction (difficulty breathing--STAT)? no  4. What is your medication issue? Pt's wife is calling states she spoke with someone on 07/04/2018 about refilling the pt's Xarelto but has yet to receive the prescription. States the pt will be running out. Please call

## 2018-07-12 NOTE — Telephone Encounter (Signed)
New Message    *STAT* If patient is at the pharmacy, call can be transferred to refill team.   1. Which medications need to be refilled? (please list name of each medication and dose if known) spironolactone (ALDACTONE) 25 MG tablet, simvastatin (ZOCOR) 20 MG tablet, and metoprolol succinate (TOPROL-XL) 25 MG 24 hr tablet  2. Which pharmacy/location (including street and city if local pharmacy) is medication to be sent to? [facility] [place] [place] [facility] Portal to [facility]  3. Do they need a 30 day or 90 day supply? 90 day

## 2018-07-12 NOTE — Telephone Encounter (Signed)
Simvastatin, metoprolol succinate refilled. Spironolactone has refills available per chart review. Wife aware

## 2018-07-12 NOTE — Telephone Encounter (Signed)
Returned call patient's wife who states he needs a refill of Xarelto to [facility]. Advised that this was filled on Jan 2 to [place]. Corrected refill and also refilled simvastatin & metoprolol succinate per request.

## 2018-07-31 DIAGNOSIS — B349 Viral infection, unspecified: Secondary | ICD-10-CM | POA: Diagnosis not present

## 2018-07-31 DIAGNOSIS — R739 Hyperglycemia, unspecified: Secondary | ICD-10-CM | POA: Diagnosis not present

## 2018-08-27 DIAGNOSIS — R69 Illness, unspecified: Secondary | ICD-10-CM | POA: Diagnosis not present

## 2018-10-29 DIAGNOSIS — E782 Mixed hyperlipidemia: Secondary | ICD-10-CM | POA: Diagnosis not present

## 2018-10-29 DIAGNOSIS — I10 Essential (primary) hypertension: Secondary | ICD-10-CM | POA: Diagnosis not present

## 2018-10-29 DIAGNOSIS — I2510 Atherosclerotic heart disease of native coronary artery without angina pectoris: Secondary | ICD-10-CM | POA: Diagnosis not present

## 2018-10-29 DIAGNOSIS — I4891 Unspecified atrial fibrillation: Secondary | ICD-10-CM | POA: Diagnosis not present

## 2018-10-29 DIAGNOSIS — I6350 Cerebral infarction due to unspecified occlusion or stenosis of unspecified cerebral artery: Secondary | ICD-10-CM | POA: Diagnosis not present

## 2019-01-16 DIAGNOSIS — H524 Presbyopia: Secondary | ICD-10-CM | POA: Diagnosis not present

## 2019-01-16 DIAGNOSIS — H2512 Age-related nuclear cataract, left eye: Secondary | ICD-10-CM | POA: Diagnosis not present

## 2019-01-16 DIAGNOSIS — H35372 Puckering of macula, left eye: Secondary | ICD-10-CM | POA: Diagnosis not present

## 2019-01-16 DIAGNOSIS — H5202 Hypermetropia, left eye: Secondary | ICD-10-CM | POA: Diagnosis not present

## 2019-01-20 ENCOUNTER — Other Ambulatory Visit: Payer: Self-pay | Admitting: Cardiovascular Disease

## 2019-01-20 NOTE — Telephone Encounter (Signed)
Pt is a 78 yr old male who saw Dr. Claiborne Billings on 06/04/18, weight at that visit was 98.2Kg. SCr on 06/24/18 was 1.11. CrCl is 76mL/min. Will refill Xarelto 20mg  QD.

## 2019-01-29 DIAGNOSIS — R69 Illness, unspecified: Secondary | ICD-10-CM | POA: Diagnosis not present

## 2019-03-21 DIAGNOSIS — I2510 Atherosclerotic heart disease of native coronary artery without angina pectoris: Secondary | ICD-10-CM | POA: Diagnosis not present

## 2019-03-21 DIAGNOSIS — Z1389 Encounter for screening for other disorder: Secondary | ICD-10-CM | POA: Diagnosis not present

## 2019-03-21 DIAGNOSIS — I6350 Cerebral infarction due to unspecified occlusion or stenosis of unspecified cerebral artery: Secondary | ICD-10-CM | POA: Diagnosis not present

## 2019-03-21 DIAGNOSIS — M109 Gout, unspecified: Secondary | ICD-10-CM | POA: Diagnosis not present

## 2019-03-21 DIAGNOSIS — Z79899 Other long term (current) drug therapy: Secondary | ICD-10-CM | POA: Diagnosis not present

## 2019-03-21 DIAGNOSIS — I4891 Unspecified atrial fibrillation: Secondary | ICD-10-CM | POA: Diagnosis not present

## 2019-03-21 DIAGNOSIS — E782 Mixed hyperlipidemia: Secondary | ICD-10-CM | POA: Diagnosis not present

## 2019-03-21 DIAGNOSIS — R7309 Other abnormal glucose: Secondary | ICD-10-CM | POA: Diagnosis not present

## 2019-03-21 DIAGNOSIS — I10 Essential (primary) hypertension: Secondary | ICD-10-CM | POA: Diagnosis not present

## 2019-03-21 DIAGNOSIS — Z0001 Encounter for general adult medical examination with abnormal findings: Secondary | ICD-10-CM | POA: Diagnosis not present

## 2019-04-09 DIAGNOSIS — I4891 Unspecified atrial fibrillation: Secondary | ICD-10-CM | POA: Diagnosis not present

## 2019-04-09 DIAGNOSIS — I2510 Atherosclerotic heart disease of native coronary artery without angina pectoris: Secondary | ICD-10-CM | POA: Diagnosis not present

## 2019-04-09 DIAGNOSIS — I6350 Cerebral infarction due to unspecified occlusion or stenosis of unspecified cerebral artery: Secondary | ICD-10-CM | POA: Diagnosis not present

## 2019-04-09 DIAGNOSIS — E782 Mixed hyperlipidemia: Secondary | ICD-10-CM | POA: Diagnosis not present

## 2019-04-09 DIAGNOSIS — I10 Essential (primary) hypertension: Secondary | ICD-10-CM | POA: Diagnosis not present

## 2019-04-10 ENCOUNTER — Other Ambulatory Visit: Payer: Self-pay | Admitting: Cardiovascular Disease

## 2019-05-01 ENCOUNTER — Other Ambulatory Visit: Payer: Self-pay | Admitting: Cardiovascular Disease

## 2019-05-15 DIAGNOSIS — L814 Other melanin hyperpigmentation: Secondary | ICD-10-CM | POA: Diagnosis not present

## 2019-05-15 DIAGNOSIS — L820 Inflamed seborrheic keratosis: Secondary | ICD-10-CM | POA: Diagnosis not present

## 2019-05-15 DIAGNOSIS — L821 Other seborrheic keratosis: Secondary | ICD-10-CM | POA: Diagnosis not present

## 2019-05-15 DIAGNOSIS — L570 Actinic keratosis: Secondary | ICD-10-CM | POA: Diagnosis not present

## 2019-05-15 DIAGNOSIS — L853 Xerosis cutis: Secondary | ICD-10-CM | POA: Diagnosis not present

## 2019-05-15 DIAGNOSIS — D225 Melanocytic nevi of trunk: Secondary | ICD-10-CM | POA: Diagnosis not present

## 2019-05-15 DIAGNOSIS — Z85828 Personal history of other malignant neoplasm of skin: Secondary | ICD-10-CM | POA: Diagnosis not present

## 2019-05-15 DIAGNOSIS — D1801 Hemangioma of skin and subcutaneous tissue: Secondary | ICD-10-CM | POA: Diagnosis not present

## 2019-06-10 ENCOUNTER — Encounter (INDEPENDENT_AMBULATORY_CARE_PROVIDER_SITE_OTHER): Payer: Medicare HMO | Admitting: Ophthalmology

## 2019-06-10 DIAGNOSIS — H35372 Puckering of macula, left eye: Secondary | ICD-10-CM

## 2019-06-10 DIAGNOSIS — H2512 Age-related nuclear cataract, left eye: Secondary | ICD-10-CM | POA: Diagnosis not present

## 2019-06-10 DIAGNOSIS — I10 Essential (primary) hypertension: Secondary | ICD-10-CM

## 2019-06-10 DIAGNOSIS — H35341 Macular cyst, hole, or pseudohole, right eye: Secondary | ICD-10-CM | POA: Diagnosis not present

## 2019-06-10 DIAGNOSIS — H43813 Vitreous degeneration, bilateral: Secondary | ICD-10-CM | POA: Diagnosis not present

## 2019-06-10 DIAGNOSIS — H35033 Hypertensive retinopathy, bilateral: Secondary | ICD-10-CM

## 2019-06-12 ENCOUNTER — Telehealth: Payer: Self-pay | Admitting: Cardiovascular Disease

## 2019-06-12 NOTE — Telephone Encounter (Signed)
Wife coming to assist as patient has memory issues and will not tell MD what is going on with his health.

## 2019-06-12 NOTE — Telephone Encounter (Signed)
New Message    Pt is calling and says he would like for his wife to assist him to help him remember everything the Dr tells him and his medications     Please call

## 2019-06-17 ENCOUNTER — Other Ambulatory Visit: Payer: Self-pay

## 2019-06-17 ENCOUNTER — Ambulatory Visit: Payer: Medicare HMO | Admitting: Cardiovascular Disease

## 2019-06-17 ENCOUNTER — Encounter: Payer: Self-pay | Admitting: Cardiovascular Disease

## 2019-06-17 VITALS — BP 130/76 | HR 81 | Temp 97.3°F | Ht 69.0 in | Wt 216.4 lb

## 2019-06-17 DIAGNOSIS — I10 Essential (primary) hypertension: Secondary | ICD-10-CM

## 2019-06-17 DIAGNOSIS — E785 Hyperlipidemia, unspecified: Secondary | ICD-10-CM

## 2019-06-17 DIAGNOSIS — R600 Localized edema: Secondary | ICD-10-CM

## 2019-06-17 DIAGNOSIS — I2583 Coronary atherosclerosis due to lipid rich plaque: Secondary | ICD-10-CM | POA: Diagnosis not present

## 2019-06-17 DIAGNOSIS — I2510 Atherosclerotic heart disease of native coronary artery without angina pectoris: Secondary | ICD-10-CM | POA: Diagnosis not present

## 2019-06-17 DIAGNOSIS — I4811 Longstanding persistent atrial fibrillation: Secondary | ICD-10-CM | POA: Diagnosis not present

## 2019-06-17 DIAGNOSIS — Z951 Presence of aortocoronary bypass graft: Secondary | ICD-10-CM | POA: Diagnosis not present

## 2019-06-17 DIAGNOSIS — Z7901 Long term (current) use of anticoagulants: Secondary | ICD-10-CM | POA: Diagnosis not present

## 2019-06-17 NOTE — Patient Instructions (Signed)
Medication Instructions:  Your physician recommends that you continue on your current medications as directed. Please refer to the Current Medication list given to you today. *If you need a refill on your cardiac medications before your next appointment, please call your pharmacy*  Lab Work: None  If you have labs (blood work) drawn today and your tests are completely normal, you will receive your results only by: Marland Kitchen MyChart Message (if you have MyChart) OR . A paper copy in the mail If you have any lab test that is abnormal or we need to change your treatment, we will call you to review the results.  Testing/Procedures: None   Follow-Up: At [facility], you and your health needs are our priority.  As part of our continuing mission to provide you with exceptional heart care, we have created designated Provider Care Teams.  These Care Teams include your primary Cardiologist (physician) and Advanced Practice Providers (APPs -  Physician Assistants and Nurse Practitioners) who all work together to provide you with the care you need, when you need it.  Your next appointment:   12 month(s)  The format for your next appointment:   In Person  Provider:   Shelva Majestic, MD  Other Instructions

## 2019-06-17 NOTE — Progress Notes (Signed)
Patient ID: Shane Espinoza, male   DOB: 09-27-1940, 78 y.o.   MRN: [id number]      HPI: Shane Espinoza is a 78 y.o. male who presents to the office for a 12 month cardiology evaluation.    Shane Espinoza has established CAD and underwent CABG revascularization surgery by Dr. Nils Pyle on 03/07/2011 after a nuclear perfusion study revealed significant scar/ischemia and cardiac catheterization demonstrated severe multivessel CAD. He had aLIMA to the LAD, vein to the OM, vein to the RV marginal, and vein to the PDA. He  developed PAF postoperatively and underwent TEE guided cardioversion with restoration of sinus rhythm. He completed cardiac rehabilitation. A nuclear perfusion study in August 2013 was markedly improved did not reveal any region of scar or ischemia. He has been off amiodarone and maintaining sinus rhythm. Additional problems include hypertension, hyperlipidemia, and intermittent lower extremity edema.  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  An MRI of his brain showed a tiny left operculum, hemorrhage, infarct, is concerned that potentially this was thrombotic.  He did not have any loss of function of the upper or lower extremities and denied any headache, dizziness, visual symptoms, or difficulty swallowing.  His ECG at that time did suggest sinus rhythm.  He now is on anticoagulation with Xarelto 20 mg. His head CT was negative as was his MRA of the large and medium size vessels.  His carotids were widely patent.  He denies chest pain.  He denies any significant neurologic sequelae.  He is now in permanent atrial fibrillation on chronic anticoagulation with Xarelto.  He has had episodes of recurrent gout.  I had taken him off HCTZ since this could be contributing to elevation of uric acid.  He is now on low-dose chronic alliupurinol and since instituting therapy has not had any recurrent gout episodes.   When I saw him in October 2018 he  denied any episodes of chest pain, palpitations, presyncope or syncope.  He underwent a nuclear stress test on 02/20/2017 which showed an EF of 47% with mild diffuse hypokinesis.  Is no evidence for scar or ischemia.  Because of the slightly reduced EF.  He underwent a echo Doppler study on 03/14/2017.  This showed an estimated EF at 55% without regional wall motion abnormalities.  His left atrium was severely dilated.  Contributed by his atrial fibrillation.  He has had issues with some intermittent leg swelling and was on amlodipine at 2.5 mg, which was reduced because of edema, furosemide 20 mg 5 days per week, irbesartan 300 mg and metoprolol, tartrate 25 mg in the morning and 12.5 mmol at night.  He is on simvastatin for hyperlipidemia and Xarelto for anticoagulation.    Last saw him on June 04, 2018 at which time he remained relatively stable from a cardiac standpoint.  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  He does use a recumbent bike at the gym.  He underwent laboratory by Dr. Inda Espinoza September 2019 which showed a glucose of 134.  Renal function was normal.  Lipid studies are excellent with a total cholesterol 111 HDL 37 LDL 53 and triglycerides 105.  Creatinine was 0.99.  Hemoglobin was 15.  He has been wearing support stockings 15 to 20 mm which has been helpful for his leg swelling.  He states his blood pressure typically runs in the 140s to 150s.  His pulse typically runs in the 50s  to 23s and occasionally in the 70s.    Since I last saw him, he has not been able to go to the gym due to the COVID-19 pandemic.  His main exercise is walking his dog.  He admits to some weight gain.  He denies anginal symptoms.  He has had follow-up lab work with his primary physician Dr. Inda Espinoza and hemoglobin A1c was 6.2.  He wears support stockings most of the time.  He denies bleeding on anticoagulation.  His atrial fibrillation rate he believes  has been well controlled.  He presents for your evaluation.  Past Medical History:  Diagnosis Date  . Anxiety   . Atrial fibrillation ([place])   . CAD (coronary artery disease)    [name]  . Gout   . Hypertension   . 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 CARDIAC CATHETERIZATION  03/02/2011   Recommended CABG  . COLON SURGERY    . CORONARY ARTERY BYPASS GRAFT  03/07/2011   DR.VAN  TRIGHT  . EYE SURGERY    . LEXISCAN MYOVIEW  02/20/2012   EKG negative for ischemia, noraml study  . TRANSTHORACIC ECHOCARDIOGRAM  02/20/2012   EF 50-55%, mild-moderate concentric LVH, LA moderately dilated, moderate mitral regurg  . VASCULAR DOPPLER  03/03/2011   Nosignificant extracranial carotid artery stenosis    No Known Allergies  Current Outpatient Medications  Medication Sig Dispense Refill  . allopurinol (ZYLOPRIM) 100 MG tablet Take 1 tablet by mouth daily.  2  . amLODipine (NORVASC) 2.5 MG tablet Take 2.5 mg by mouth daily.    . beta carotene w/minerals (OCUVITE) tablet Take 1 tablet by mouth daily.      . Cholecalciferol (VITAMIN D3) 5000 UNITS CAPS Take by mouth. Monday, Wednesday, Friday    . colchicine 0.6 MG tablet Take 0.6 mg by mouth as needed.    . fish oil-omega-3 fatty acids 1000 MG capsule Take 500 mg by mouth daily.     . furosemide (LASIX) 20 MG tablet TAKE 1 TABLET DAILY, TAKE 1EXTRA TABLET AS NEEDED FOR SWELLING 120 tablet 0  . imiquimod (ALDARA) 5 % cream Apply 1 application topically as directed.    . metoprolol succinate (TOPROL-XL) 25 MG 24 hr tablet TAKE 1 TABLET DAILY 90 tablet 0  . simvastatin (ZOCOR) 20 MG tablet TAKE 1 TABLET AT BEDTIME 90 tablet 0  . spironolactone (ALDACTONE) 25 MG tablet TAKE 1/2 TABLET [=12.5MG]  DAILY 45 tablet 0  . valsartan (DIOVAN) 320 MG tablet TAKE 1 TABLET DAILY        (DISCONTINUE IRBESARTAN DUETO BACK ORDER) 90 tablet 3  . XARELTO 20 MG TABS tablet TAKE 1 TABLET DAILY WITH   SUPPER  90 tablet 1  . fluorouracil (EFUDEX) 5 % cream APP EXT AA Q NIGHT     No current facility-administered medications for this visit.    Social History   Socioeconomic History  . Marital status: Married    Spouse name: Not on file  . Number of children: 2  . Years of education: college  . Highest education level: Not on file  Occupational History  . Occupation: retitred  Tobacco Use  . Smoking status: Never Smoker  . Smokeless tobacco: Never Used  Substance and Sexual Activity  . Alcohol use: No  . Drug use: No  . Sexual activity: Never  Other Topics Concern  . Not on file  Social History Narrative  .  Not on file   Social Determinants of Health   Financial Resource Strain:   . Difficulty of Paying Living Expenses: Not on file  Food Insecurity:   . Worried About Charity fundraiser in the Last Year: Not on file  . Ran Out of Food in the Last Year: Not on file  Transportation Needs:   . Lack of Transportation (Medical): Not on file  . Lack of Transportation (Non-Medical): Not on file  Physical Activity:   . Days of Exercise per Week: Not on file  . Minutes of Exercise per Session: Not on file  Stress:   . Feeling of Stress : Not on file  Social Connections:   . Frequency of Communication with Friends and Family: Not on file  . Frequency of Social Gatherings with Friends and Family: Not on file  . Attends Religious Services: Not on file  . Active Member of Clubs or Organizations: Not on file  . Attends Archivist Meetings: Not on file  . Marital Status: Not on file  Intimate Partner Violence:   . Fear of Current or Ex-Partner: Not on file  . Emotionally Abused: Not on file  . Physically Abused: Not on file  . Sexually Abused: Not on file    Family History  Problem Relation Age of Onset  . CVA Father   . Heart attack Maternal Grandmother   . Heart attack Maternal Grandfather   . Heart murmur Sister   . Hypertension Son    Socially he is married has  2 children 4 grandchildren. He now exercises fairly regularly. There is no tobacco or alcohol use.   ROS General: Negative; No fevers, chills, or night sweats;  HEENT: Negative; No changes in vision or hearing, sinus congestion, difficulty swallowing Pulmonary: Negative; No cough, wheezing, shortness of breath, hemoptysis Cardiovascular: see history of present illness Positive for intermittent edema GI: Positive for hemorrhoids and he status post hemorrhoidal banding; No nausea, vomiting, diarrhea, or abdominal pain GU: Negative; No dysuria, hematuria, or difficulty voiding Musculoskeletal: Positive for gout;  no myalgias,  or weakness Hematologic/Oncology: Negative; no easy bruising, bleeding Endocrine: Negative; no heat/cold intolerance; no diabetes Neuro: Positive for thrombotic stroke; no changes in balance, headaches Skin: Negative; No rashes or skin lesions Psychiatric: Negative; No behavioral problems, depression Sleep: Negative; No snoring, daytime sleepiness, hypersomnolence, bruxism, restless legs, hypnogognic hallucinations, no cataplexy Other comprehensive 14 point system review is negative.   PE BP 130/76   Pulse 81   Temp (!) 97.3 F (36.3 C)   Ht 5' 9"  (1.753 m)   Wt 216 lb 6.4 oz (98.2 kg)   SpO2 98%   BMI 31.96 kg/m    Repeat blood pressure by me was 128/76  Wt Readings from Last 3 Encounters:  06/17/19 216 lb 6.4 oz (98.2 kg)  06/04/18 216 lb 6.4 oz (98.2 kg)  04/26/17 208 lb 3.2 oz (94.4 kg)   General: Alert, oriented, no distress.  Skin: normal turgor, no rashes, warm and dry HEENT: Normocephalic, atraumatic. Pupils equal round and reactive to light; sclera anicteric; extraocular muscles intact; Nose without nasal septal hypertrophy Mouth/Parynx benign; Mallinpatti scale 3 Neck: No JVD, no carotid bruits; normal carotid upstroke Lungs: clear to ausculatation and percussion; no wheezing or rales Chest wall: without tenderness to palpitation Heart: PMI  not displaced, RRR, s1 s2 normal, 1/6 systolic murmur, no diastolic murmur, no rubs, gallops, thrills, or heaves Abdomen: Diastases recti; central adiposity; soft, nontender; no hepatosplenomehaly, BS+; abdominal aorta nontender  and not dilated by palpation. Back: no CVA tenderness Pulses 2+ Musculoskeletal: full range of motion, normal strength, no joint deformities Extremities: Trivial residual edema when he does not use his support stockings; no clubbing cyanosis or edema, Homan's sign negative  Neurologic: grossly nonfocal; Cranial nerves grossly wnl Psychologic: Normal mood and affect   ECG (independently read by me): Atrial fibrillation at 81 bpm.  Poor anterior R wave progression.  QTc interval 446 ms  December 2019 ECG (independently read by me): Atrial fibrillation at 76 bpm QTc interval 443 ms.  No ECG done today, but review of ECG at the time of his nuclear study on 02/20/2017 showed atrial fibrillation at 78 bpm  November 2017 ECG (independently read by me): Atrial fibrillation at 70 bpm.  Poor anterior R-wave progression.  QTc interval 475 ms.  November 2016 ECG (independently read by me): Atrial fibrillation at 67 bpm.  Nonspecific ST changes.  QTc interval normal at 443 ms.  May 2016 ECG (independently read by me): Atrial fibrillation with a controlled ventricular rate in the 60s.  December 2015 ECG (independently read by me): Atrial fibrillation with a ventricular rate at 84.  No significant ST-T changes  June 2015 ECG (independently read by me): Atrial fibrillation with a ventricular rate is 74.  Nonspecific ST changes.  10/09/2013 ECG (independently read by me): Atrial fibrillation with controlled ventricular response at 63 beats per minute.  Prior April 16 2013 ECG: Sinus bradycardia at 52 beats per minute.  LABS: BMP Latest Ref Rng & Units 06/24/2018 12/08/2014 09/24/2013  Glucose 65 - 99 mg/dL 149(H) 116(H) 101(H)  BUN 8 - 27 mg/dL 15 11 15   Creatinine 0.76 -  1.27 mg/dL 1.11 0.90 0.80  BUN/Creat Ratio 10 - 24 14 - -  Sodium 134 - 144 mmol/L 140 142 144  Potassium 3.5 - 5.2 mmol/L 4.4 3.8 3.9  Chloride 96 - 106 mmol/L 102 103 103  CO2 20 - 29 mmol/L 25 27 26   Calcium 8.6 - 10.2 mg/dL 9.5 9.4 9.2   Hepatic Function Latest Ref Rng & Units 12/08/2014 09/24/2013 09/23/2013  Total Protein 6.0 - 8.3 g/dL 7.1 6.7 7.2  Albumin 3.5 - 5.2 g/dL 4.1 3.3(L) 3.7  AST 0 - 37 U/L 24 25 30   ALT 0 - 53 U/L 19 22 28   Alk Phosphatase 39 - 117 U/L 65 54 61  Total Bilirubin 0.2 - 1.2 mg/dL 1.1 0.7 0.6   CBC Latest Ref Rng & Units 12/08/2014 09/24/2013 09/23/2013  WBC 4.0 - 10.5 K/uL 8.0 9.8 -  Hemoglobin 13.0 - 17.0 g/dL 15.4 14.7 16.3  Hematocrit 39.0 - 52.0 % 46.9 43.2 48.0  Platelets 150 - 400 K/uL 208 203 -   Lab Results  Component Value Date   MCV 88.2 12/08/2014   MCV 89.8 09/24/2013   MCV 89.7 09/23/2013   Lab Results  Component Value Date   TSH 2.031 12/08/2014   Lab Results  Component Value Date   HGBA1C 6.7 (H) 06/24/2018   Lipid Panel     Component Value Date/Time   CHOL 108 12/08/2014 0803   TRIG 106 12/08/2014 0803   HDL 41 12/08/2014 0803   CHOLHDL 2.6 12/08/2014 0803   VLDL 21 12/08/2014 0803   LDLCALC 46 12/08/2014 0803    IMPRESSION:  1. Longstanding persistent atrial fibrillation ([place])   2. Coronary artery disease due to lipid rich plaque   3. S/P CABG x 4   4. Essential hypertension   5. Anticoagulation adequate  6. Hyperlipidemia with target LDL less than 70   7. Lower extremity edema     ASSESSMENT AND PLAN: Shane Espinoza is a 78 year-old white male who underwent CABG revascularization surgery for severe multivessel CAD in September 2012.  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  He has permanent atrial fibrillation with a controlled ventricular response for which he is entirely asymptomatic.  He continues to be on  Xarelto anticoagulation therapy. He denies any bleeding. He is unaware of his heart rate being irregular. His last nuclear stress test in August 2018 continues to show normal perfusion without scar or ischemia.  There was some discordance to the EF on the nuclear study when compared to the echo.  The echo reveals an EF of 55% without wall motion abnormality, severe LA dilatation, which undoubtedly is contributed by his atrial fibrillation.  When I last saw him, his blood pressure was elevated and he continued to experience leg edema.  Low-dose spironolactone was added to his regimen.  His blood pressure today is stable on amlodipine at reduced dose 2.5 mg, metoprolol succinate 25 mg, valsartan 320 mg, and spironolactone 12.5 mg daily.  Atrial fibrillation rate is controlled around 80.  He continues to be on Xarelto for anticoagulation without bleeding.  He is on simvastatin 20 mg for hyperlipidemia.  Target LDL is less than 70.  Most recent hemoglobin A1c was improved at 6.2, followed by Dr. Inda Espinoza.  I discussed weight loss and increased exercise.  Will be undergoing repeat blood work with his primary physician.  I will asked that these be sent to me for my review.  I will see him in 1 year for reevaluation.  Time spent: 91mnuites  TTroy Sine MD, F[facility] 06/17/2019 11:01 AM

## 2019-06-24 DIAGNOSIS — R69 Illness, unspecified: Secondary | ICD-10-CM | POA: Diagnosis not present

## 2019-07-16 ENCOUNTER — Other Ambulatory Visit: Payer: Self-pay | Admitting: Cardiovascular Disease

## 2019-07-16 NOTE — Telephone Encounter (Signed)
New Message       *STAT* If patient is at the pharmacy, call can be transferred to refill team.   1. Which medications need to be refilled? (please list name of each medication and dose if known)  furosemide (LASIX) 20 MG tablet spironolactone (ALDACTONE) 25 MG tablet XARELTO 20 MG TABS tablet  2. Which pharmacy/location (including street and city if local pharmacy) is medication to be sent to?[place], [place]  3. Do they need a 30 day or 90 day supply? 90 days   Pts wife says they need quantity 120 for Lasix

## 2019-07-25 ENCOUNTER — Other Ambulatory Visit: Payer: Self-pay | Admitting: Cardiovascular Disease

## 2019-07-25 NOTE — Telephone Encounter (Signed)
Rx request sent to pharmacy.  

## 2019-07-31 DIAGNOSIS — I4891 Unspecified atrial fibrillation: Secondary | ICD-10-CM | POA: Diagnosis not present

## 2019-07-31 DIAGNOSIS — I110 Hypertensive heart disease with heart failure: Secondary | ICD-10-CM | POA: Diagnosis not present

## 2019-07-31 DIAGNOSIS — I252 Old myocardial infarction: Secondary | ICD-10-CM | POA: Diagnosis not present

## 2019-07-31 DIAGNOSIS — M109 Gout, unspecified: Secondary | ICD-10-CM | POA: Diagnosis not present

## 2019-07-31 DIAGNOSIS — E669 Obesity, unspecified: Secondary | ICD-10-CM | POA: Diagnosis not present

## 2019-07-31 DIAGNOSIS — I2510 Atherosclerotic heart disease of native coronary artery without angina pectoris: Secondary | ICD-10-CM | POA: Diagnosis not present

## 2019-07-31 DIAGNOSIS — E785 Hyperlipidemia, unspecified: Secondary | ICD-10-CM | POA: Diagnosis not present

## 2019-07-31 DIAGNOSIS — D6869 Other thrombophilia: Secondary | ICD-10-CM | POA: Diagnosis not present

## 2019-07-31 DIAGNOSIS — I509 Heart failure, unspecified: Secondary | ICD-10-CM | POA: Diagnosis not present

## 2019-07-31 DIAGNOSIS — Z008 Encounter for other general examination: Secondary | ICD-10-CM | POA: Diagnosis not present

## 2019-07-31 DIAGNOSIS — E261 Secondary hyperaldosteronism: Secondary | ICD-10-CM | POA: Diagnosis not present

## 2019-10-20 DIAGNOSIS — I2510 Atherosclerotic heart disease of native coronary artery without angina pectoris: Secondary | ICD-10-CM | POA: Diagnosis not present

## 2019-10-20 DIAGNOSIS — I10 Essential (primary) hypertension: Secondary | ICD-10-CM | POA: Diagnosis not present

## 2019-10-20 DIAGNOSIS — I6350 Cerebral infarction due to unspecified occlusion or stenosis of unspecified cerebral artery: Secondary | ICD-10-CM | POA: Diagnosis not present

## 2019-10-20 DIAGNOSIS — E782 Mixed hyperlipidemia: Secondary | ICD-10-CM | POA: Diagnosis not present

## 2019-10-20 DIAGNOSIS — I4891 Unspecified atrial fibrillation: Secondary | ICD-10-CM | POA: Diagnosis not present

## 2019-10-24 ENCOUNTER — Other Ambulatory Visit: Payer: Self-pay

## 2019-10-24 ENCOUNTER — Other Ambulatory Visit: Payer: Self-pay | Admitting: *Deleted

## 2019-10-24 ENCOUNTER — Other Ambulatory Visit: Payer: Self-pay | Admitting: Pharmacist Clinician (PhC)/ Clinical Pharmacy Specialist

## 2019-10-24 MED ORDER — METOPROLOL SUCCINATE ER 25 MG PO TB24: 25.0000 mg | ORAL_TABLET | Freq: Every day | ORAL | 3 refills | Status: DC

## 2019-10-24 MED ORDER — SIMVASTATIN 20 MG PO TABS: 20.0000 mg | ORAL_TABLET | Freq: Every day | ORAL | 3 refills | Status: DC

## 2019-10-24 MED ORDER — RIVAROXABAN 20 MG PO TABS: 20.0000 mg | ORAL_TABLET | Freq: Every day | ORAL | 1 refills | Status: DC

## 2019-10-24 MED ORDER — SPIRONOLACTONE 25 MG PO TABS: 12.5000 mg | ORAL_TABLET | Freq: Every day | ORAL | 3 refills | Status: DC

## 2019-10-24 NOTE — Telephone Encounter (Signed)
79 M 98.2 kg, SCr 1.13, CrCl 72.4, LOV [place] 06/2019

## 2019-10-28 ENCOUNTER — Other Ambulatory Visit: Payer: Self-pay

## 2019-11-18 DIAGNOSIS — M1711 Unilateral primary osteoarthritis, right knee: Secondary | ICD-10-CM | POA: Diagnosis not present

## 2019-11-21 DIAGNOSIS — I4891 Unspecified atrial fibrillation: Secondary | ICD-10-CM | POA: Diagnosis not present

## 2019-11-21 DIAGNOSIS — I2510 Atherosclerotic heart disease of native coronary artery without angina pectoris: Secondary | ICD-10-CM | POA: Diagnosis not present

## 2019-11-21 DIAGNOSIS — E782 Mixed hyperlipidemia: Secondary | ICD-10-CM | POA: Diagnosis not present

## 2019-11-21 DIAGNOSIS — I10 Essential (primary) hypertension: Secondary | ICD-10-CM | POA: Diagnosis not present

## 2019-11-21 DIAGNOSIS — I6350 Cerebral infarction due to unspecified occlusion or stenosis of unspecified cerebral artery: Secondary | ICD-10-CM | POA: Diagnosis not present

## 2019-12-22 DIAGNOSIS — M66811 Spontaneous rupture of other tendons, right shoulder: Secondary | ICD-10-CM | POA: Diagnosis not present

## 2019-12-25 ENCOUNTER — Other Ambulatory Visit: Payer: Self-pay | Admitting: Cardiovascular Disease

## 2019-12-30 DIAGNOSIS — R69 Illness, unspecified: Secondary | ICD-10-CM | POA: Diagnosis not present

## 2020-01-22 ENCOUNTER — Other Ambulatory Visit: Payer: Self-pay

## 2020-01-22 DIAGNOSIS — H2512 Age-related nuclear cataract, left eye: Secondary | ICD-10-CM | POA: Diagnosis not present

## 2020-01-22 DIAGNOSIS — H35372 Puckering of macula, left eye: Secondary | ICD-10-CM | POA: Diagnosis not present

## 2020-01-22 DIAGNOSIS — H5202 Hypermetropia, left eye: Secondary | ICD-10-CM | POA: Diagnosis not present

## 2020-01-22 DIAGNOSIS — H524 Presbyopia: Secondary | ICD-10-CM | POA: Diagnosis not present

## 2020-01-26 ENCOUNTER — Other Ambulatory Visit: Payer: Self-pay

## 2020-01-26 DIAGNOSIS — R69 Illness, unspecified: Secondary | ICD-10-CM | POA: Diagnosis not present

## 2020-01-27 ENCOUNTER — Telehealth: Payer: Self-pay | Admitting: Cardiovascular Disease

## 2020-01-27 MED ORDER — FUROSEMIDE 20 MG PO TABS: ORAL_TABLET | ORAL | 3 refills | Status: DC

## 2020-01-27 NOTE — Telephone Encounter (Signed)
Pt's wife aware new script sent [facility]

## 2020-01-27 NOTE — Telephone Encounter (Signed)
Patient's wife, [name], states they received the furosemide (LASIX) 20 MG tablet from the [facility] but only received 60 pills. [name] advised they usually get 120 tablets for a 90 day supply, she said Dr. Claiborne Billings told the patient to take an extra tablet as needed for swelling. Patient needs another 60 day supply - it can be sent to [place], [place] to [facility]istered [facility]. Please advise.

## 2020-02-10 DIAGNOSIS — R69 Illness, unspecified: Secondary | ICD-10-CM | POA: Diagnosis not present

## 2020-03-11 DIAGNOSIS — R350 Frequency of micturition: Secondary | ICD-10-CM | POA: Diagnosis not present

## 2020-03-11 DIAGNOSIS — R197 Diarrhea, unspecified: Secondary | ICD-10-CM | POA: Diagnosis not present

## 2020-03-11 DIAGNOSIS — N471 Phimosis: Secondary | ICD-10-CM | POA: Diagnosis not present

## 2020-03-12 DIAGNOSIS — R197 Diarrhea, unspecified: Secondary | ICD-10-CM | POA: Diagnosis not present

## 2020-03-12 DIAGNOSIS — R350 Frequency of micturition: Secondary | ICD-10-CM | POA: Diagnosis not present

## 2020-03-26 DIAGNOSIS — A0472 Enterocolitis due to Clostridium difficile, not specified as recurrent: Secondary | ICD-10-CM | POA: Diagnosis not present

## 2020-03-26 DIAGNOSIS — N3000 Acute cystitis without hematuria: Secondary | ICD-10-CM | POA: Diagnosis not present

## 2020-04-06 DIAGNOSIS — N3000 Acute cystitis without hematuria: Secondary | ICD-10-CM | POA: Diagnosis not present

## 2020-04-06 DIAGNOSIS — I2510 Atherosclerotic heart disease of native coronary artery without angina pectoris: Secondary | ICD-10-CM | POA: Diagnosis not present

## 2020-04-06 DIAGNOSIS — E559 Vitamin D deficiency, unspecified: Secondary | ICD-10-CM | POA: Diagnosis not present

## 2020-04-06 DIAGNOSIS — R7303 Prediabetes: Secondary | ICD-10-CM | POA: Diagnosis not present

## 2020-04-06 DIAGNOSIS — I4891 Unspecified atrial fibrillation: Secondary | ICD-10-CM | POA: Diagnosis not present

## 2020-04-06 DIAGNOSIS — D6869 Other thrombophilia: Secondary | ICD-10-CM | POA: Diagnosis not present

## 2020-04-06 DIAGNOSIS — A0472 Enterocolitis due to Clostridium difficile, not specified as recurrent: Secondary | ICD-10-CM | POA: Diagnosis not present

## 2020-04-06 DIAGNOSIS — Z0001 Encounter for general adult medical examination with abnormal findings: Secondary | ICD-10-CM | POA: Diagnosis not present

## 2020-04-06 DIAGNOSIS — I10 Essential (primary) hypertension: Secondary | ICD-10-CM | POA: Diagnosis not present

## 2020-04-06 DIAGNOSIS — E782 Mixed hyperlipidemia: Secondary | ICD-10-CM | POA: Diagnosis not present

## 2020-04-06 DIAGNOSIS — M109 Gout, unspecified: Secondary | ICD-10-CM | POA: Diagnosis not present

## 2020-04-12 ENCOUNTER — Other Ambulatory Visit: Payer: Self-pay | Admitting: Cardiovascular Disease

## 2020-04-14 DIAGNOSIS — E538 Deficiency of other specified B group vitamins: Secondary | ICD-10-CM | POA: Diagnosis not present

## 2020-04-19 DIAGNOSIS — N471 Phimosis: Secondary | ICD-10-CM | POA: Diagnosis not present

## 2020-04-20 ENCOUNTER — Other Ambulatory Visit: Payer: Self-pay

## 2020-04-20 ENCOUNTER — Ambulatory Visit: Payer: Medicare HMO | Admitting: Cardiovascular Disease

## 2020-04-20 ENCOUNTER — Encounter: Payer: Self-pay | Admitting: Cardiovascular Disease

## 2020-04-20 DIAGNOSIS — I2510 Atherosclerotic heart disease of native coronary artery without angina pectoris: Secondary | ICD-10-CM

## 2020-04-20 DIAGNOSIS — I10 Essential (primary) hypertension: Secondary | ICD-10-CM

## 2020-04-20 DIAGNOSIS — Z7901 Long term (current) use of anticoagulants: Secondary | ICD-10-CM

## 2020-04-20 DIAGNOSIS — Z951 Presence of aortocoronary bypass graft: Secondary | ICD-10-CM

## 2020-04-20 DIAGNOSIS — I4811 Longstanding persistent atrial fibrillation: Secondary | ICD-10-CM | POA: Diagnosis not present

## 2020-04-20 DIAGNOSIS — E785 Hyperlipidemia, unspecified: Secondary | ICD-10-CM

## 2020-04-20 DIAGNOSIS — Z01818 Encounter for other preprocedural examination: Secondary | ICD-10-CM | POA: Diagnosis not present

## 2020-04-20 DIAGNOSIS — I2583 Coronary atherosclerosis due to lipid rich plaque: Secondary | ICD-10-CM | POA: Diagnosis not present

## 2020-04-20 NOTE — Patient Instructions (Addendum)
Medication Instructions:  No changes *If you need a refill on your cardiac medications before your next appointment, please call your pharmacy*   Lab Work: None ordered If you have labs (blood work) drawn today and your tests are completely normal, you will receive your results only by: Marland Kitchen MyChart Message (if you have MyChart) OR . A paper copy in the mail If you have any lab test that is abnormal or we need to change your treatment, we will call you to review the results.   Testing/Procedures: Your physician has requested that you have an echocardiogram this week or at the beginning of next week. Echocardiography is a painless test that uses sound waves to create images of your heart. It provides your doctor with information about the size and shape of your heart and how well your heart's chambers and valves are working. This procedure takes approximately one hour. There are no restrictions for this procedure. This test is performed at [address].     Follow-Up: At [facility], you and your health needs are our priority.  As part of our continuing mission to provide you with exceptional heart care, we have created designated Provider Care Teams.  These Care Teams include your primary Cardiologist (physician) and Advanced Practice Providers (APPs -  Physician Assistants and Nurse Practitioners) who all work together to provide you with the care you need, when you need it.  We recommend signing up for the patient portal called "MyChart".  Sign up information is provided on this After Visit Summary.  MyChart is used to connect with patients for Virtual Visits (Telemedicine).  Patients are able to view lab/test results, encounter notes, upcoming appointments, etc.  Non-urgent messages can be sent to your provider as well.   To learn more about what you can do with MyChart, go to [email or web address].    Your next appointment:   6 month(s)  The format for your next appointment:    In Person  Provider:   Shelva Majestic, MD   Other Instructions If your echo looks good, we will notify you that your request for surgical clearance has been approved. If you proceed with surgery, HOLD your Xarelto for 2 days (48 hours) before your procedure.

## 2020-04-20 NOTE — Progress Notes (Signed)
Patient ID: Shane Espinoza, male   DOB: February 29, 1941, 79 y.o.   MRN: [id number]      HPI: Shane Espinoza is a 79 y.o. male who presents to the office for a 10 month cardiology evaluation.    [name] has established CAD and underwent CABG revascularization surgery by Dr. Nils Pyle on 03/07/2011 after a nuclear perfusion study revealed significant scar/ischemia and cardiac catheterization demonstrated severe multivessel CAD. He had aLIMA to the LAD, vein to the OM, vein to the RV marginal, and vein to the PDA. He  developed PAF postoperatively and underwent TEE guided cardioversion with restoration of sinus rhythm. He completed cardiac rehabilitation. A nuclear perfusion study in August 2013 was markedly improved did not reveal any region of scar or ischemia. He has been off amiodarone and maintaining sinus rhythm. Additional problems include hypertension, hyperlipidemia, and intermittent lower extremity edema.  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  An MRI of his brain showed a tiny left operculum, hemorrhage, infarct, is concerned that potentially this was thrombotic.  He did not have any loss of function of the upper or lower extremities and denied any headache, dizziness, visual symptoms, or difficulty swallowing.  His ECG at that time did suggest sinus rhythm.  He now is on anticoagulation with Xarelto 20 mg. His head CT was negative as was his MRA of the large and medium size vessels.  His carotids were widely patent.  He denies chest pain.  He denies any significant neurologic sequelae.  He is  in permanent atrial fibrillation on chronic anticoagulation with Xarelto.  He has had episodes of recurrent gout.  I had taken him off HCTZ since this could be contributing to elevation of uric acid.  He is now on low-dose chronic alliupurinol and since instituting therapy has not had any recurrent gout episodes.   When I saw him in October 2018 he denied  any episodes of chest pain, palpitations, presyncope or syncope.  He underwent a nuclear stress test on 02/20/2017 which showed an EF of 47% with mild diffuse hypokinesis.  Is no evidence for scar or ischemia.  Because of the slightly reduced EF.  He underwent a echo Doppler study on 03/14/2017.  This showed an estimated EF at 55% without regional wall motion abnormalities.  His left atrium was severely dilated.  Contributed by his atrial fibrillation.  He has had issues with some intermittent leg swelling and was on amlodipine at 2.5 mg, which was reduced because of edema, furosemide 20 mg 5 days per week, irbesartan 300 mg and metoprolol, tartrate 25 mg in the morning and 12.5 mmol at night.  He is on simvastatin for hyperlipidemia and Xarelto for anticoagulation.    I saw him on June 04, 2018 at which time he remained relatively stable from a cardiac standpoint.  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  He does use a recumbent bike at the gym.  He underwent laboratory by Dr. Inda Merlin September 2019 which showed a glucose of 134.  Renal function was normal.  Lipid studies are excellent with a total cholesterol 111 HDL 37 LDL 53 and triglycerides 105.  Creatinine was 0.99.  Hemoglobin was 15.  He has been wearing support stockings 15 to 20 mm which has been helpful for his leg swelling.  He states his blood pressure typically runs in the 140s to 150s.  His pulse typically runs in the 50s  to 50s and occasionally in the 70s.    When I last saw him in December 2020 he was not  able to go to the gym due to the COVID-19 pandemic.  His main exercise was walking his dog.  He admits to some weight gain.  He denied anginal symptoms.  He has had follow-up lab work with his primary physician Dr. Inda Merlin and hemoglobin A1c was 6.2.  He wears support stockings most of the time.  He denies bleeding on anticoagulation.  His atrial fibrillation rate  has been  well controlled.  At that evaluation, his blood pressure was stable on amlodipine at a reduced dose of 2.5 mg, metoprolol succinate 25 mg, valsartan 320 mg and spironolactone 12.5 mg.  Over the past year he has done fairly well from a cardiovascular standpoint.  However, he had recently had diarrhea and was diagnosed with C. difficile and E. coli for which he took antibiotics.  He also is uncircumcised and his have been difficulty with scarring.  He has seen Dr. Diona Fanti of [facility] and was told of having phimosis.  He is in need to undergo a modified circumcision and has been on anticoagulation therapy.  He presents for cardiology evaluation.  Past Medical History:  Diagnosis Date  . Anxiety   . Atrial fibrillation ([place])   . CAD (coronary artery disease)    [name]  . Gout   . Hypertension   . 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 CARDIAC CATHETERIZATION  03/02/2011   Recommended CABG  . COLON SURGERY    . CORONARY ARTERY BYPASS GRAFT  03/07/2011   DR.VAN  TRIGHT  . EYE SURGERY    . LEXISCAN MYOVIEW  02/20/2012   EKG negative for ischemia, noraml study  . TRANSTHORACIC ECHOCARDIOGRAM  02/20/2012   EF 50-55%, mild-moderate concentric LVH, LA moderately dilated, moderate mitral regurg  . VASCULAR DOPPLER  03/03/2011   Nosignificant extracranial carotid artery stenosis    No Known Allergies  Current Outpatient Medications  Medication Sig Dispense Refill  . allopurinol (ZYLOPRIM) 100 MG tablet Take 1 tablet by mouth daily.  2  . amLODipine (NORVASC) 2.5 MG tablet Take 2.5 mg by mouth daily.    . beta carotene w/minerals (OCUVITE) tablet Take 1 tablet by mouth daily.      . Cholecalciferol (VITAMIN D3) 5000 UNITS CAPS Take by mouth. Monday, Wednesday, Friday    . colchicine 0.6 MG tablet Take 0.6 mg by mouth as needed.    . fish oil-omega-3 fatty acids 1000 MG capsule Take 500 mg by mouth daily.     . fluorouracil  (EFUDEX) 5 % cream APP EXT AA Q NIGHT    . furosemide (LASIX) 20 MG tablet TAKE 1 TABLET DAILY, TAKE 1EXTRA TABLET AS NEEDED FOR SWELLING 180 tablet 3  . imiquimod (ALDARA) 5 % cream Apply 1 application topically as directed.    . metoprolol succinate (TOPROL-XL) 25 MG 24 hr tablet Take 1 tablet (25 mg total) by mouth daily. 90 tablet 3  . rivaroxaban (XARELTO) 20 MG TABS tablet Take 1 tablet (20 mg total) by mouth daily with supper. 90 tablet 1  . simvastatin (ZOCOR) 20 MG tablet Take 1 tablet (20 mg total) by mouth at bedtime. 90 tablet 3  . spironolactone (ALDACTONE) 25 MG tablet Take 0.5 tablets (12.5 mg total) by mouth daily. 45 tablet 3  . valsartan (DIOVAN) 320 MG tablet  TAKE 1 TABLET DAILY        (DISCONTINUE IRBESARTAN DUETO BACK ORDER) 90 tablet 3   No current facility-administered medications for this visit.    Social History   Socioeconomic History  . Marital status: Married    Spouse name: Not on file  . Number of children: 2  . Years of education: college  . Highest education level: Not on file  Occupational History  . Occupation: retitred  Tobacco Use  . Smoking status: Never Smoker  . Smokeless tobacco: Never Used  Substance and Sexual Activity  . Alcohol use: No  . Drug use: No  . Sexual activity: Never  Other Topics Concern  . Not on file  Social History Narrative  . Not on file   Social Determinants of Health   Financial Resource Strain:   . Difficulty of Paying Living Expenses: Not on file  Food Insecurity:   . Worried About Charity fundraiser in the Last Year: Not on file  . Ran Out of Food in the Last Year: Not on file  Transportation Needs:   . Lack of Transportation (Medical): Not on file  . Lack of Transportation (Non-Medical): Not on file  Physical Activity:   . Days of Exercise per Week: Not on file  . Minutes of Exercise per Session: Not on file  Stress:   . Feeling of Stress : Not on file  Social Connections:   . Frequency of  Communication with Friends and Family: Not on file  . Frequency of Social Gatherings with Friends and Family: Not on file  . Attends Religious Services: Not on file  . Active Member of Clubs or Organizations: Not on file  . Attends Archivist Meetings: Not on file  . Marital Status: Not on file  Intimate Partner Violence:   . Fear of Current or Ex-Partner: Not on file  . Emotionally Abused: Not on file  . Physically Abused: Not on file  . Sexually Abused: Not on file    Family History  Problem Relation Age of Onset  . CVA Father   . Heart attack Maternal Grandmother   . Heart attack Maternal Grandfather   . Heart murmur Sister   . Hypertension Son    Socially he is married has 2 children 4 grandchildren. He now exercises fairly regularly. There is no tobacco or alcohol use.   ROS General: Negative; No fevers, chills, or night sweats;  HEENT: Negative; No changes in vision or hearing, sinus congestion, difficulty swallowing Pulmonary: Negative; No cough, wheezing, shortness of breath, hemoptysis Cardiovascular: see history of present illness Positive for intermittent edema GI: Positive for hemorrhoids and he status post hemorrhoidal banding; recent C. difficile infection GU: Recently diagnosed phimosis in need for modified circumcision Musculoskeletal: Positive for gout;  no myalgias,  or weakness Hematologic/Oncology: Negative; no easy bruising, bleeding Endocrine: Negative; no heat/cold intolerance; no diabetes Neuro: Positive for thrombotic stroke; no changes in balance, headaches Skin: Negative; No rashes or skin lesions Psychiatric: Negative; No behavioral problems, depression Sleep: Negative; No snoring, daytime sleepiness, hypersomnolence, bruxism, restless legs, hypnogognic hallucinations, no cataplexy Other comprehensive 14 point system review is negative.   PE BP 136/62   Pulse 75   Ht 5' 9"  (1.753 m)   Wt 214 lb 6.4 oz (97.3 kg)   SpO2 97%   BMI  31.66 kg/m    Repeat blood pressure by me was 130/68  Wt Readings from Last 3 Encounters:  04/20/20 214 lb 6.4 oz (  97.3 kg)  06/17/19 216 lb 6.4 oz (98.2 kg)  06/04/18 216 lb 6.4 oz (98.2 kg)     General: Alert, oriented, no distress.  Skin: normal turgor, no rashes, warm and dry HEENT: Normocephalic, atraumatic. Pupils equal round and reactive to light; sclera anicteric; extraocular muscles intact;  Nose without nasal septal hypertrophy Mouth/Parynx benign; Mallinpatti scale 3 Neck: No JVD, no carotid bruits; normal carotid upstroke Lungs: clear to ausculatation and percussion; no wheezing or rales Chest wall: without tenderness to palpitation Heart: PMI not displaced, RRR, s1 s2 normal, 1/6 systolic murmur, no diastolic murmur, no rubs, gallops, thrills, or heaves Abdomen: soft, nontender; no hepatosplenomehaly, BS+; abdominal aorta nontender and not dilated by palpation. Back: no CVA tenderness Pulses 2+ Musculoskeletal: full range of motion, normal strength, no joint deformities Extremities: no clubbing cyanosis or edema, Homan's sign negative  Neurologic: grossly nonfocal; Cranial nerves grossly wnl Psychologic: Normal mood and affect   ECG (independently read by me): Atrila fibrillation at 74, PVC  December 2020 ECG (independently read by me): Atrial fibrillation at 81 bpm.  Poor anterior R wave progression.  QTc interval 446 ms  December 2019 ECG (independently read by me): Atrial fibrillation at 76 bpm QTc interval 443 ms.  No ECG done today, but review of ECG at the time of his nuclear study on 02/20/2017 showed atrial fibrillation at 78 bpm  November 2017 ECG (independently read by me): Atrial fibrillation at 70 bpm.  Poor anterior R-wave progression.  QTc interval 475 ms.  November 2016 ECG (independently read by me): Atrial fibrillation at 67 bpm.  Nonspecific ST changes.  QTc interval normal at 443 ms.  May 2016 ECG (independently read by me): Atrial  fibrillation with a controlled ventricular rate in the 60s.  December 2015 ECG (independently read by me): Atrial fibrillation with a ventricular rate at 84.  No significant ST-T changes  June 2015 ECG (independently read by me): Atrial fibrillation with a ventricular rate is 74.  Nonspecific ST changes.  10/09/2013 ECG (independently read by me): Atrial fibrillation with controlled ventricular response at 63 beats per minute.  Prior April 16 2013 ECG: Sinus bradycardia at 52 beats per minute.  LABS: BMP Latest Ref Rng & Units 06/24/2018 12/08/2014 09/24/2013  Glucose 65 - 99 mg/dL 149(H) 116(H) 101(H)  BUN 8 - 27 mg/dL 15 11 15   Creatinine 0.76 - 1.27 mg/dL 1.11 0.90 0.80  BUN/Creat Ratio 10 - 24 14 - -  Sodium 134 - 144 mmol/L 140 142 144  Potassium 3.5 - 5.2 mmol/L 4.4 3.8 3.9  Chloride 96 - 106 mmol/L 102 103 103  CO2 20 - 29 mmol/L 25 27 26   Calcium 8.6 - 10.2 mg/dL 9.5 9.4 9.2   Hepatic Function Latest Ref Rng & Units 12/08/2014 09/24/2013 09/23/2013  Total Protein 6.0 - 8.3 g/dL 7.1 6.7 7.2  Albumin 3.5 - 5.2 g/dL 4.1 3.3(L) 3.7  AST 0 - 37 U/L 24 25 30   ALT 0 - 53 U/L 19 22 28   Alk Phosphatase 39 - 117 U/L 65 54 61  Total Bilirubin 0.2 - 1.2 mg/dL 1.1 0.7 0.6   CBC Latest Ref Rng & Units 12/08/2014 09/24/2013 09/23/2013  WBC 4.0 - 10.5 K/uL 8.0 9.8 -  Hemoglobin 13.0 - 17.0 g/dL 15.4 14.7 16.3  Hematocrit 39 - 52 % 46.9 43.2 48.0  Platelets 150 - 400 K/uL 208 203 -   Lab Results  Component Value Date   MCV 88.2 12/08/2014   MCV 89.8 09/24/2013  MCV 89.7 09/23/2013   Lab Results  Component Value Date   TSH 2.031 12/08/2014   Lab Results  Component Value Date   HGBA1C 6.7 (H) 06/24/2018   Lipid Panel     Component Value Date/Time   CHOL 108 12/08/2014 0803   TRIG 106 12/08/2014 0803   HDL 41 12/08/2014 0803   CHOLHDL 2.6 12/08/2014 0803   VLDL 21 12/08/2014 0803   LDLCALC 46 12/08/2014 0803    IMPRESSION:  1. Longstanding persistent atrial fibrillation  ([place])   2. Coronary artery disease due to lipid rich plaque   3. S/P CABG x 4   4. Anticoagulation adequate   5. Preoperative clearance   6. Essential hypertension   7. Hyperlipidemia with target LDL less than 70     ASSESSMENT AND PLAN: Mr. Prather is an 79 year-old white male who underwent CABG revascularization surgery for severe multivessel CAD in September 2012.  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  He has permanent atrial fibrillation with a controlled ventricular response for which he is entirely asymptomatic.  He continues to be on Xarelto anticoagulation therapy. He denies any bleeding. He is unaware of his heart rate being irregular. His last nuclear stress test in August 2018 continued to show normal perfusion without scar or ischemia.  There was some discordance to the EF on the nuclear study when compared to the echo.  The echo reveals an EF of 55% without wall motion abnormality, severe LA dilatation, which undoubtedly is contributed by his atrial fibrillation.  Had a prior evaluation, his blood pressure medications were adjusted for improved control.  His blood pressure today is stable on his current regimen of amlodipine 2.5 mg, furosemide 20 mg, metoprolol succinate 25 mg, spironolactone 12.5 mg in addition to valsartan 320 mg.  He continues to be in atrial fibrillation with rate control.  He continues to be on Xarelto for anticoagulation at 20 mg daily.  Renal function is normal with his last creatinine on April 06, 2020 at 1.17.  He is not had any recurrent anginal symptomatology and denies any significant change in exertional capacity or shortness of breath.  I am recommending that he undergo a follow-up echo Doppler study to make certain there is been no change in LV function since he tells me he will need general anesthesia.  If the echo remains stable  I am giving him  clearance to undergo his partial circumcision.  However, based on his renal function and the fact that he has permanent atrial fibrillation I have recommended that he hold Xarelto only for 48 hours and not 3 days as originally requested by Dr. Diona Fanti prior to the planned procedure.  He will need to resume Xarelto the following day or day after of his surgery. He continues to be on simvastatin 20 mg daily for hyperlipidemia.  Most recent LDL cholesterol on April 06, 2020 was excellent at 54.     Troy Sine, MD, [facility]  04/22/2020 2:01 PM

## 2020-04-21 ENCOUNTER — Telehealth: Payer: Self-pay | Admitting: Cardiovascular Disease

## 2020-04-21 DIAGNOSIS — E538 Deficiency of other specified B group vitamins: Secondary | ICD-10-CM | POA: Diagnosis not present

## 2020-04-21 NOTE — Telephone Encounter (Signed)
Left message for patient to call and scheduled Echocardiogram that was ordered by Dr. Claiborne Billings

## 2020-04-21 NOTE — Telephone Encounter (Signed)
Spoke with patient regarding appointment for Echo scheduled Tuesday 04/27/20 at 2:50 pm at our [facility] location.  --arrival time is 2:30 pm-----patient voiced his understanding.

## 2020-04-22 ENCOUNTER — Encounter: Payer: Self-pay | Admitting: Cardiovascular Disease

## 2020-04-27 ENCOUNTER — Other Ambulatory Visit: Payer: Self-pay

## 2020-04-27 ENCOUNTER — Ambulatory Visit (HOSPITAL_COMMUNITY): Payer: Medicare HMO | Attending: Cardiovascular Disease

## 2020-04-27 DIAGNOSIS — I4811 Longstanding persistent atrial fibrillation: Secondary | ICD-10-CM

## 2020-04-27 DIAGNOSIS — I2583 Coronary atherosclerosis due to lipid rich plaque: Secondary | ICD-10-CM | POA: Diagnosis not present

## 2020-04-27 DIAGNOSIS — I2510 Atherosclerotic heart disease of native coronary artery without angina pectoris: Secondary | ICD-10-CM

## 2020-04-27 LAB — ECHOCARDIOGRAM COMPLETE
MV M vel: 5.47 m/s
MV Peak grad: 119.7 mmHg
Radius: 0.6 cm
S' Lateral: 3.5 cm

## 2020-04-28 DIAGNOSIS — E538 Deficiency of other specified B group vitamins: Secondary | ICD-10-CM | POA: Diagnosis not present

## 2020-04-29 ENCOUNTER — Telehealth: Payer: Self-pay | Admitting: Cardiovascular Disease

## 2020-04-29 NOTE — Telephone Encounter (Signed)
Patient made aware. Please see echo results.

## 2020-04-29 NOTE — Telephone Encounter (Signed)
Patient's wife, [name], calling in to check on status of echo results from 04/28/2020. [name] states the patient was confused about the results and whether he is cleared for surgery. Patient spoke with Lars Mage on 10/27. Please call/advise  Thank you!

## 2020-05-04 ENCOUNTER — Other Ambulatory Visit: Payer: Self-pay | Admitting: Cardiovascular Disease

## 2020-05-05 DIAGNOSIS — E538 Deficiency of other specified B group vitamins: Secondary | ICD-10-CM | POA: Diagnosis not present

## 2020-05-14 DIAGNOSIS — Z85828 Personal history of other malignant neoplasm of skin: Secondary | ICD-10-CM | POA: Diagnosis not present

## 2020-05-14 DIAGNOSIS — L570 Actinic keratosis: Secondary | ICD-10-CM | POA: Diagnosis not present

## 2020-05-14 DIAGNOSIS — L218 Other seborrheic dermatitis: Secondary | ICD-10-CM | POA: Diagnosis not present

## 2020-05-14 DIAGNOSIS — L821 Other seborrheic keratosis: Secondary | ICD-10-CM | POA: Diagnosis not present

## 2020-05-14 DIAGNOSIS — D1801 Hemangioma of skin and subcutaneous tissue: Secondary | ICD-10-CM | POA: Diagnosis not present

## 2020-05-25 DIAGNOSIS — I6350 Cerebral infarction due to unspecified occlusion or stenosis of unspecified cerebral artery: Secondary | ICD-10-CM | POA: Diagnosis not present

## 2020-05-25 DIAGNOSIS — I4891 Unspecified atrial fibrillation: Secondary | ICD-10-CM | POA: Diagnosis not present

## 2020-05-25 DIAGNOSIS — E782 Mixed hyperlipidemia: Secondary | ICD-10-CM | POA: Diagnosis not present

## 2020-05-25 DIAGNOSIS — I2510 Atherosclerotic heart disease of native coronary artery without angina pectoris: Secondary | ICD-10-CM | POA: Diagnosis not present

## 2020-05-25 DIAGNOSIS — I10 Essential (primary) hypertension: Secondary | ICD-10-CM | POA: Diagnosis not present

## 2020-06-10 ENCOUNTER — Other Ambulatory Visit: Payer: Self-pay

## 2020-06-10 ENCOUNTER — Encounter (INDEPENDENT_AMBULATORY_CARE_PROVIDER_SITE_OTHER): Payer: Medicare HMO | Admitting: Ophthalmology

## 2020-06-10 DIAGNOSIS — H35372 Puckering of macula, left eye: Secondary | ICD-10-CM | POA: Diagnosis not present

## 2020-06-10 DIAGNOSIS — H2512 Age-related nuclear cataract, left eye: Secondary | ICD-10-CM | POA: Diagnosis not present

## 2020-06-10 DIAGNOSIS — H35341 Macular cyst, hole, or pseudohole, right eye: Secondary | ICD-10-CM

## 2020-06-10 DIAGNOSIS — I10 Essential (primary) hypertension: Secondary | ICD-10-CM

## 2020-06-10 DIAGNOSIS — H35033 Hypertensive retinopathy, bilateral: Secondary | ICD-10-CM

## 2020-06-10 DIAGNOSIS — H43812 Vitreous degeneration, left eye: Secondary | ICD-10-CM

## 2020-07-08 ENCOUNTER — Emergency Department (HOSPITAL_COMMUNITY): Payer: Medicare HMO

## 2020-07-08 ENCOUNTER — Other Ambulatory Visit: Payer: Self-pay

## 2020-07-08 ENCOUNTER — Encounter (HOSPITAL_COMMUNITY): Payer: Self-pay | Admitting: Emergency Medicine

## 2020-07-08 ENCOUNTER — Emergency Department (HOSPITAL_COMMUNITY)
Admission: EM | Admit: 2020-07-08 | Discharge: 2020-07-08 | Disposition: A | Discharge status: Home / Self Care | Payer: Medicare HMO | Attending: Emergency Medicine | Admitting: Emergency Medicine

## 2020-07-08 DIAGNOSIS — M25571 Pain in right ankle and joints of right foot: Secondary | ICD-10-CM | POA: Insufficient documentation

## 2020-07-08 DIAGNOSIS — S8261XA Displaced fracture of lateral malleolus of right fibula, initial encounter for closed fracture: Secondary | ICD-10-CM | POA: Diagnosis not present

## 2020-07-08 DIAGNOSIS — S99911A Unspecified injury of right ankle, initial encounter: Secondary | ICD-10-CM | POA: Diagnosis not present

## 2020-07-08 DIAGNOSIS — Z743 Need for continuous supervision: Secondary | ICD-10-CM | POA: Diagnosis not present

## 2020-07-08 DIAGNOSIS — I2510 Atherosclerotic heart disease of native coronary artery without angina pectoris: Secondary | ICD-10-CM | POA: Diagnosis not present

## 2020-07-08 DIAGNOSIS — Z955 Presence of coronary angioplasty implant and graft: Secondary | ICD-10-CM | POA: Insufficient documentation

## 2020-07-08 DIAGNOSIS — R609 Edema, unspecified: Secondary | ICD-10-CM | POA: Diagnosis not present

## 2020-07-08 DIAGNOSIS — W19XXXA Unspecified fall, initial encounter: Secondary | ICD-10-CM | POA: Diagnosis not present

## 2020-07-08 DIAGNOSIS — M25461 Effusion, right knee: Secondary | ICD-10-CM | POA: Diagnosis not present

## 2020-07-08 DIAGNOSIS — I10 Essential (primary) hypertension: Secondary | ICD-10-CM | POA: Diagnosis not present

## 2020-07-08 DIAGNOSIS — M25561 Pain in right knee: Secondary | ICD-10-CM | POA: Diagnosis not present

## 2020-07-08 DIAGNOSIS — Z79899 Other long term (current) drug therapy: Secondary | ICD-10-CM | POA: Diagnosis not present

## 2020-07-08 DIAGNOSIS — M7989 Other specified soft tissue disorders: Secondary | ICD-10-CM | POA: Diagnosis not present

## 2020-07-08 DIAGNOSIS — R0781 Pleurodynia: Secondary | ICD-10-CM | POA: Diagnosis not present

## 2020-07-08 DIAGNOSIS — R52 Pain, unspecified: Secondary | ICD-10-CM | POA: Diagnosis not present

## 2020-07-08 DIAGNOSIS — R079 Chest pain, unspecified: Secondary | ICD-10-CM | POA: Diagnosis not present

## 2020-07-08 DIAGNOSIS — M1711 Unilateral primary osteoarthritis, right knee: Secondary | ICD-10-CM | POA: Diagnosis not present

## 2020-07-08 NOTE — Discharge Instructions (Addendum)
Thank you for choosing [facility] for your care today.  To do: Please call the orthopedic clinic tomorrow to set up a follow up appointment with them for next week. Try to keep weight off the R leg as much as possible and use crutches or a walker to get around at home. You can use Tylenol every 6 hours for pain control. Do not take more than 4000 mg per 24 hour period. Please follow up with your primary care doctor in the next few days. If you do not have a PCP you are established with, you can call [phone]  or [phone]  to access [facility] Find A Doctor service. You can also visit [email or web address] Please come back to the Emergency Department if you have shortness of breath, chest pain, confusion/mental status changes, if you have so much nausea/vomiting that you cannot keep down fluids, or if you have any other symptoms that worry you.  Take care. Hope you start feeling better soon.  Corliss Blacker, MD Emergency Medicine

## 2020-07-08 NOTE — ED Notes (Signed)
Ortho paged to place splint. 

## 2020-07-08 NOTE — ED Provider Notes (Signed)
[facility]ERGENCY DEPARTMENT Provider Note   CSN: [id number] Arrival date & time: 07/08/20  1525     History Chief Complaint  Patient presents with  . Fall    CLARY EDEL is a 80 y.o. male.  The history is provided by the patient.  Fall This is a new problem. The current episode started yesterday. Pertinent negatives include no chest pain, no abdominal pain, no headaches and no shortness of breath. The symptoms are relieved by acetaminophen. He has tried acetaminophen for the symptoms.   Pt is an 80 y/o male with a history of atrial fibrillation on Xarelto, CAD s/p CABG, prior CVA, HTN, HLD, gout who presents to ED with R knee and ankle pain following 2 falls. Pt states that yesterday morning around 9:30 AM his right knee gave out and he fell. He sustained injury to his right knee and right ankle/foot. This morning, he was walking on crutches and fell again, exacerbating the pain in the right lower extremity. He fell between 2 chairs and states he now also has pain to his right lateral chest wall. Has taken Tylenol for the pain. Currently 5/10 in intensity. States fall was entirely due to R knee "giving out," which is not unusual for him due to degenerative changes - denies chest pain, shortness of breath, lightheadedness.    Past Medical History:  Diagnosis Date  . Anxiety   . Atrial fibrillation ([place])   . CAD (coronary artery disease)    [name]  . Gout   . Hypertension   . 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 Gout 12/07/2013  . Atrial fibrillation ([place]) 12/07/2013  . Acute thrombotic stroke ([place]) 09/23/2013  . CVA (cerebral infarction) 09/23/2013  . HTN (hypertension) 04/16/2013  . Hyperlipidemia with target LDL less than 70 04/16/2013  . CAD (coronary artery disease)   . Anxiety   . Hypertension   . S/P CABG x 4     Past Surgical History:  Procedure Laterality Date  .  CARDIAC CATHETERIZATION  03/02/2011   Recommended CABG  . COLON SURGERY    . CORONARY ARTERY BYPASS GRAFT  03/07/2011   DR.VAN  TRIGHT  . EYE SURGERY    . LEXISCAN MYOVIEW  02/20/2012   EKG negative for ischemia, noraml study  . TRANSTHORACIC ECHOCARDIOGRAM  02/20/2012   EF 50-55%, mild-moderate concentric LVH, LA moderately dilated, moderate mitral regurg  . VASCULAR DOPPLER  03/03/2011   Nosignificant extracranial carotid artery stenosis       Family History  Problem Relation Age of Onset  . CVA Father   . Heart attack Maternal Grandmother   . Heart attack Maternal Grandfather   . Heart murmur Sister   . Hypertension Son     Social History   Tobacco Use  . Smoking status: Never Smoker  . Smokeless tobacco: Never Used  Substance Use Topics  . Alcohol use: No  . Drug use: No    Home Medications Prior to Admission medications   Medication Sig Start Date End Date Taking? Authorizing Provider  allopurinol (ZYLOPRIM) 100 MG tablet Take 1 tablet by mouth daily. 05/22/14   [provider]  amLODipine (NORVASC) 2.5 MG tablet Take 2.5 mg by mouth daily.    [provider]  beta carotene w/minerals (OCUVITE) tablet Take 1 tablet by mouth daily.      [provider]  Cholecalciferol (  VITAMIN D3) 5000 UNITS CAPS Take by mouth. Monday, Wednesday, Friday    [provider]  colchicine 0.6 MG tablet Take 0.6 mg by mouth as needed.    [provider]  fish oil-omega-3 fatty acids 1000 MG capsule Take 500 mg by mouth daily.     [provider]  fluorouracil (EFUDEX) 5 % cream APP EXT AA Q NIGHT 05/27/19   [provider]  furosemide (LASIX) 20 MG tablet TAKE 1 TABLET DAILY, TAKE 1EXTRA TABLET AS NEEDED FOR SWELLING 01/27/20   Lennette Bihari, MD  imiquimod [place] Dad) 5 % cream Apply 1 application topically as directed. 04/29/16   [provider]  metoprolol succinate (TOPROL-XL) 25 MG 24 hr tablet Take 1 tablet (25 mg  total) by mouth daily. 10/24/19   Lennette Bihari, MD  simvastatin (ZOCOR) 20 MG tablet Take 1 tablet (20 mg total) by mouth at bedtime. 10/24/19   Lennette Bihari, MD  spironolactone (ALDACTONE) 25 MG tablet Take 0.5 tablets (12.5 mg total) by mouth daily. 10/24/19   Lennette Bihari, MD  valsartan (DIOVAN) 320 MG tablet TAKE 1 TABLET DAILY        (DISCONTINUE IRBESARTAN DUETO BACK ORDER) 04/12/20   Lennette Bihari, MD  XARELTO 20 MG TABS tablet TAKE 1 TABLET DAILY WITH   SUPPER 05/04/20   Lennette Bihari, MD    Allergies    Patient has no known allergies.  Review of Systems   Review of Systems  Constitutional: Negative for chills and fever.  HENT: Negative for ear pain, rhinorrhea and sore throat.   Eyes: Negative for pain and visual disturbance.  Respiratory: Negative for cough, shortness of breath and wheezing.   Cardiovascular: Positive for leg swelling (Acute injury on chronic LE edema). Negative for chest pain.  Gastrointestinal: Negative for abdominal pain, blood in stool, diarrhea, nausea and vomiting.  Genitourinary: Positive for frequency (Normal for him). Negative for dysuria and hematuria.  Musculoskeletal: Positive for arthralgias, gait problem and joint swelling. Negative for back pain.  Skin: Positive for color change. Negative for rash.  Neurological: Negative for syncope, light-headedness and headaches.  Psychiatric/Behavioral: Negative for confusion and suicidal ideas.  All other systems reviewed and are negative.   Physical Exam Updated Vital Signs BP (!) 143/88 (BP Location: Left Arm)   Pulse 89   Temp 98.4 F (36.9 C) (Oral)   Resp 16   Ht 5\' 9"  (1.753 m)   Wt 95.7 kg   SpO2 100%   BMI 31.16 kg/m   Physical Exam Vitals and nursing note reviewed.  Constitutional:      General: He is not in acute distress.    Appearance: Normal appearance. He is well-developed and well-nourished. He is not ill-appearing or toxic-appearing.  HENT:     Head: Normocephalic and  atraumatic.  Eyes:     General: No scleral icterus.    Extraocular Movements: Extraocular movements intact.  Cardiovascular:     Rate and Rhythm: Normal rate. Rhythm irregular.     Pulses: Normal pulses.     Heart sounds: No friction rub. No gallop.   Pulmonary:     Effort: Pulmonary effort is normal. No respiratory distress.     Breath sounds: Normal breath sounds. No wheezing, rhonchi or rales.  Chest:     Chest wall: Tenderness (R posterolateral chest wall) present.  Abdominal:     Palpations: Abdomen is soft.     Tenderness: There is no abdominal tenderness. There is no  guarding or rebound.  Musculoskeletal:     Cervical back: Neck supple.     Right lower leg: Edema present.     Left lower leg: Edema present.     Comments: Bruising about the R ankle and through the R foot.  Right knee tender to palpation, pain with range of motion  Neurovascularly intact distal right lower extremity.  Skin:    General: Skin is warm and dry.  Neurological:     Mental Status: He is alert.     Comments: Alert, grossly oriented, moves all extremities spontaneously  Psychiatric:        Mood and Affect: Mood and affect normal.        Behavior: Behavior normal.     ED Results / Procedures / Treatments   Labs (all labs ordered are listed, but only abnormal results are displayed) Labs Reviewed - No data to display  EKG None  Radiology DG Chest 2 View  Result Date: 07/08/2020 CLINICAL DATA:  Right-sided chest pain x1 day. EXAM: CHEST - 2 VIEW COMPARISON:  May 18, 2014 FINDINGS: Multiple sternal wires are seen. There is no evidence of acute infiltrate. Mild, stable blunting of the left costophrenic angle is seen. No pneumothorax is identified. Stable, mild to moderate severity cardiac silhouette enlargement is noted. Degenerative changes seen throughout the thoracic spine. IMPRESSION: 1. Evidence of prior median sternotomy. 2. A small, stable left pleural effusion versus pleural thickening.  Electronically Signed   By: Virgina Norfolk M.D.   On: 07/08/2020 18:39   DG Knee 2 Views Right  Result Date: 07/08/2020 CLINICAL DATA:  Right knee pain and swelling. EXAM: RIGHT KNEE - 1-2 VIEW COMPARISON:  None. FINDINGS: No evidence of acute fracture or dislocation. Marked severity medial, lateral and patellofemoral compartment space narrowing is seen. A moderate sized joint effusion is seen. IMPRESSION: Severe tricompartmental degenerative changes with a moderate sized joint effusion. Electronically Signed   By: Virgina Norfolk M.D.   On: 07/08/2020 16:04   DG Ankle Complete Right  Result Date: 07/08/2020 CLINICAL DATA:  Status post fall. EXAM: RIGHT ANKLE - COMPLETE 3+ VIEW COMPARISON:  None. FINDINGS: Acute nondisplaced fracture of the right lateral malleolus is seen. A small curvilinear cortical density is seen adjacent to the distal tip of the right medial malleolus. There is no evidence of dislocation. There is no evidence of arthropathy or other focal bone abnormality. Moderate severity diffuse soft tissue swelling is seen. IMPRESSION: Acute nondisplaced fracture of the right lateral malleolus with additional findings suggestive of a small avulsion fracture adjacent to the right medial malleolus. Electronically Signed   By: Virgina Norfolk M.D.   On: 07/08/2020 16:08    Procedures Procedures (including critical care time)  Medications Ordered in ED Medications - No data to display  ED Course  I have reviewed the triage vital signs and the nursing notes.  Pertinent labs & imaging results that were available during my care of the patient were reviewed by me and considered in my medical decision making (see chart for details).    MDM Rules/Calculators/A&P                          80 year old male presents after fall.  Patient is overall very well-appearing and hemodynamically stable.  Complains of pain that is worst in his right lower extremity.  On exam there is significant  ecchymosis and tenderness around the right ankle.  Imaging reviewed and notable for R  lateral malleolus fracture with additional likely small R medial malleolus avulsion fracture.  CXR without acute findings; there is a small, stable left pleural effusion.  Orthopedics consulted; discussed pt's case with Dr. Stann Mainland.  Patient will call the orthopedic clinic tomorrow to set up a follow-up appointment for next week with repeat imaging at that time.  Ortho tech has placed a posterior short leg splint with stirrup on the pt. Will send pt home with walker and instructions for NWB to R lower extremity. Have discussed plan over the phone with pt's wife, who is in agreement.  At this time, pt appears safe for discharge with strict return precautions provided and instructions for follow up given. Pt and family voiced understanding and are amenable to this plan. Pt discharged in stable condition.  Final Clinical Impression(s) / ED Diagnoses Final diagnoses:  Fall, initial encounter  Closed fracture of proximal lateral malleolus of right fibula, initial encounter     Vanna Scotland, MD [id number]    Lajean Saver, MD 07/09/20 1555

## 2020-07-08 NOTE — Progress Notes (Signed)
Orthopedic Tech Progress Note Patient Details:  Shane Espinoza [facility] Dec 09, 1940 [id number] Patient stated he would want splint instead of boot after saying he wanted boot instead  Ortho Devices Type of Ortho Device: Stirrup splint,Post splint Splint Material: Fiberglass Ortho Device/Splint Location: RLE Ortho Device/Splint Interventions: Ordered,Application,Adjustment   Post Interventions Patient Tolerated: Well Instructions Provided: Care of device,Poper ambulation with device   General Wearing 07/08/2020, 7:52 PM

## 2020-07-08 NOTE — Progress Notes (Signed)
Orthopedic Tech Progress Note Patient Details:  Shane Espinoza July 18, 1940 [id number] Spoke with MD patient refused splint. Ortho applied cam walker Ortho Devices Type of Ortho Device: CAM walker Ortho Device/Splint Location: RLE Ortho Device/Splint Interventions: Ordered,Application,Adjustment   Post Interventions Patient Tolerated: Well Instructions Provided: Care of device,Adjustment of device,Poper ambulation with device   Shane Espinoza 07/08/2020, 6:55 PM

## 2020-07-08 NOTE — ED Triage Notes (Signed)
Pt arrives to ED with [facility] with c/o right knee and right ankle pain after x2 falls. Pt states he fell yesterday after his right knee gave out and fell again today when he was using crutches. Pt denies injury to head, face, or neck. Pt states both were mechanical falls, no dizziness, lightheadedness, or increased lethargy.

## 2020-07-08 NOTE — Care Management (Signed)
Patient unable to use crutches, rolling walker ordered, ED CM provided walker from [employer] prior to discharge.

## 2020-07-12 DIAGNOSIS — I4891 Unspecified atrial fibrillation: Secondary | ICD-10-CM | POA: Diagnosis not present

## 2020-07-12 DIAGNOSIS — I10 Essential (primary) hypertension: Secondary | ICD-10-CM | POA: Diagnosis not present

## 2020-07-12 DIAGNOSIS — I2510 Atherosclerotic heart disease of native coronary artery without angina pectoris: Secondary | ICD-10-CM | POA: Diagnosis not present

## 2020-07-12 DIAGNOSIS — I6350 Cerebral infarction due to unspecified occlusion or stenosis of unspecified cerebral artery: Secondary | ICD-10-CM | POA: Diagnosis not present

## 2020-07-12 DIAGNOSIS — E782 Mixed hyperlipidemia: Secondary | ICD-10-CM | POA: Diagnosis not present

## 2020-07-13 DIAGNOSIS — M25571 Pain in right ankle and joints of right foot: Secondary | ICD-10-CM | POA: Diagnosis not present

## 2020-07-21 DIAGNOSIS — M1711 Unilateral primary osteoarthritis, right knee: Secondary | ICD-10-CM | POA: Diagnosis not present

## 2020-07-21 DIAGNOSIS — R69 Illness, unspecified: Secondary | ICD-10-CM | POA: Diagnosis not present

## 2020-07-21 DIAGNOSIS — Z7901 Long term (current) use of anticoagulants: Secondary | ICD-10-CM | POA: Diagnosis not present

## 2020-07-21 DIAGNOSIS — Z8673 Personal history of transient ischemic attack (TIA), and cerebral infarction without residual deficits: Secondary | ICD-10-CM | POA: Diagnosis not present

## 2020-07-21 DIAGNOSIS — I4891 Unspecified atrial fibrillation: Secondary | ICD-10-CM | POA: Diagnosis not present

## 2020-07-21 DIAGNOSIS — E785 Hyperlipidemia, unspecified: Secondary | ICD-10-CM | POA: Diagnosis not present

## 2020-07-21 DIAGNOSIS — Z9181 History of falling: Secondary | ICD-10-CM | POA: Diagnosis not present

## 2020-07-21 DIAGNOSIS — S8261XD Displaced fracture of lateral malleolus of right fibula, subsequent encounter for closed fracture with routine healing: Secondary | ICD-10-CM | POA: Diagnosis not present

## 2020-07-21 DIAGNOSIS — M109 Gout, unspecified: Secondary | ICD-10-CM | POA: Diagnosis not present

## 2020-07-21 DIAGNOSIS — I2510 Atherosclerotic heart disease of native coronary artery without angina pectoris: Secondary | ICD-10-CM | POA: Diagnosis not present

## 2020-07-21 DIAGNOSIS — I10 Essential (primary) hypertension: Secondary | ICD-10-CM | POA: Diagnosis not present

## 2020-07-21 DIAGNOSIS — Z951 Presence of aortocoronary bypass graft: Secondary | ICD-10-CM | POA: Diagnosis not present

## 2020-07-23 DIAGNOSIS — Z7901 Long term (current) use of anticoagulants: Secondary | ICD-10-CM | POA: Diagnosis not present

## 2020-07-23 DIAGNOSIS — Z8673 Personal history of transient ischemic attack (TIA), and cerebral infarction without residual deficits: Secondary | ICD-10-CM | POA: Diagnosis not present

## 2020-07-23 DIAGNOSIS — M109 Gout, unspecified: Secondary | ICD-10-CM | POA: Diagnosis not present

## 2020-07-23 DIAGNOSIS — E785 Hyperlipidemia, unspecified: Secondary | ICD-10-CM | POA: Diagnosis not present

## 2020-07-23 DIAGNOSIS — I4891 Unspecified atrial fibrillation: Secondary | ICD-10-CM | POA: Diagnosis not present

## 2020-07-23 DIAGNOSIS — R69 Illness, unspecified: Secondary | ICD-10-CM | POA: Diagnosis not present

## 2020-07-23 DIAGNOSIS — Z9181 History of falling: Secondary | ICD-10-CM | POA: Diagnosis not present

## 2020-07-23 DIAGNOSIS — M1711 Unilateral primary osteoarthritis, right knee: Secondary | ICD-10-CM | POA: Diagnosis not present

## 2020-07-23 DIAGNOSIS — I10 Essential (primary) hypertension: Secondary | ICD-10-CM | POA: Diagnosis not present

## 2020-07-23 DIAGNOSIS — I2510 Atherosclerotic heart disease of native coronary artery without angina pectoris: Secondary | ICD-10-CM | POA: Diagnosis not present

## 2020-07-23 DIAGNOSIS — Z951 Presence of aortocoronary bypass graft: Secondary | ICD-10-CM | POA: Diagnosis not present

## 2020-07-23 DIAGNOSIS — S8261XD Displaced fracture of lateral malleolus of right fibula, subsequent encounter for closed fracture with routine healing: Secondary | ICD-10-CM | POA: Diagnosis not present

## 2020-07-26 DIAGNOSIS — S8261XD Displaced fracture of lateral malleolus of right fibula, subsequent encounter for closed fracture with routine healing: Secondary | ICD-10-CM | POA: Diagnosis not present

## 2020-07-26 DIAGNOSIS — I10 Essential (primary) hypertension: Secondary | ICD-10-CM | POA: Diagnosis not present

## 2020-07-26 DIAGNOSIS — M1711 Unilateral primary osteoarthritis, right knee: Secondary | ICD-10-CM | POA: Diagnosis not present

## 2020-07-26 DIAGNOSIS — E785 Hyperlipidemia, unspecified: Secondary | ICD-10-CM | POA: Diagnosis not present

## 2020-07-26 DIAGNOSIS — Z9181 History of falling: Secondary | ICD-10-CM | POA: Diagnosis not present

## 2020-07-26 DIAGNOSIS — Z951 Presence of aortocoronary bypass graft: Secondary | ICD-10-CM | POA: Diagnosis not present

## 2020-07-26 DIAGNOSIS — M109 Gout, unspecified: Secondary | ICD-10-CM | POA: Diagnosis not present

## 2020-07-26 DIAGNOSIS — R69 Illness, unspecified: Secondary | ICD-10-CM | POA: Diagnosis not present

## 2020-07-26 DIAGNOSIS — I2510 Atherosclerotic heart disease of native coronary artery without angina pectoris: Secondary | ICD-10-CM | POA: Diagnosis not present

## 2020-07-26 DIAGNOSIS — Z7901 Long term (current) use of anticoagulants: Secondary | ICD-10-CM | POA: Diagnosis not present

## 2020-07-26 DIAGNOSIS — I4891 Unspecified atrial fibrillation: Secondary | ICD-10-CM | POA: Diagnosis not present

## 2020-07-26 DIAGNOSIS — Z8673 Personal history of transient ischemic attack (TIA), and cerebral infarction without residual deficits: Secondary | ICD-10-CM | POA: Diagnosis not present

## 2020-07-28 ENCOUNTER — Ambulatory Visit: Payer: Medicare HMO | Admitting: Cardiovascular Disease

## 2020-07-28 DIAGNOSIS — E785 Hyperlipidemia, unspecified: Secondary | ICD-10-CM | POA: Diagnosis not present

## 2020-07-28 DIAGNOSIS — M109 Gout, unspecified: Secondary | ICD-10-CM | POA: Diagnosis not present

## 2020-07-28 DIAGNOSIS — R69 Illness, unspecified: Secondary | ICD-10-CM | POA: Diagnosis not present

## 2020-07-28 DIAGNOSIS — I10 Essential (primary) hypertension: Secondary | ICD-10-CM | POA: Diagnosis not present

## 2020-07-28 DIAGNOSIS — M1711 Unilateral primary osteoarthritis, right knee: Secondary | ICD-10-CM | POA: Diagnosis not present

## 2020-07-28 DIAGNOSIS — Z7901 Long term (current) use of anticoagulants: Secondary | ICD-10-CM | POA: Diagnosis not present

## 2020-07-28 DIAGNOSIS — Z9181 History of falling: Secondary | ICD-10-CM | POA: Diagnosis not present

## 2020-07-28 DIAGNOSIS — Z951 Presence of aortocoronary bypass graft: Secondary | ICD-10-CM | POA: Diagnosis not present

## 2020-07-28 DIAGNOSIS — I4891 Unspecified atrial fibrillation: Secondary | ICD-10-CM | POA: Diagnosis not present

## 2020-07-28 DIAGNOSIS — S8261XD Displaced fracture of lateral malleolus of right fibula, subsequent encounter for closed fracture with routine healing: Secondary | ICD-10-CM | POA: Diagnosis not present

## 2020-07-28 DIAGNOSIS — I2510 Atherosclerotic heart disease of native coronary artery without angina pectoris: Secondary | ICD-10-CM | POA: Diagnosis not present

## 2020-07-28 DIAGNOSIS — Z8673 Personal history of transient ischemic attack (TIA), and cerebral infarction without residual deficits: Secondary | ICD-10-CM | POA: Diagnosis not present

## 2020-07-30 DIAGNOSIS — S8290XA Unspecified fracture of unspecified lower leg, initial encounter for closed fracture: Secondary | ICD-10-CM | POA: Diagnosis not present

## 2020-08-02 DIAGNOSIS — Z8673 Personal history of transient ischemic attack (TIA), and cerebral infarction without residual deficits: Secondary | ICD-10-CM | POA: Diagnosis not present

## 2020-08-02 DIAGNOSIS — M109 Gout, unspecified: Secondary | ICD-10-CM | POA: Diagnosis not present

## 2020-08-02 DIAGNOSIS — Z9181 History of falling: Secondary | ICD-10-CM | POA: Diagnosis not present

## 2020-08-02 DIAGNOSIS — R69 Illness, unspecified: Secondary | ICD-10-CM | POA: Diagnosis not present

## 2020-08-02 DIAGNOSIS — Z951 Presence of aortocoronary bypass graft: Secondary | ICD-10-CM | POA: Diagnosis not present

## 2020-08-02 DIAGNOSIS — M1711 Unilateral primary osteoarthritis, right knee: Secondary | ICD-10-CM | POA: Diagnosis not present

## 2020-08-02 DIAGNOSIS — Z7901 Long term (current) use of anticoagulants: Secondary | ICD-10-CM | POA: Diagnosis not present

## 2020-08-02 DIAGNOSIS — I2510 Atherosclerotic heart disease of native coronary artery without angina pectoris: Secondary | ICD-10-CM | POA: Diagnosis not present

## 2020-08-02 DIAGNOSIS — I4891 Unspecified atrial fibrillation: Secondary | ICD-10-CM | POA: Diagnosis not present

## 2020-08-02 DIAGNOSIS — E785 Hyperlipidemia, unspecified: Secondary | ICD-10-CM | POA: Diagnosis not present

## 2020-08-02 DIAGNOSIS — S8261XD Displaced fracture of lateral malleolus of right fibula, subsequent encounter for closed fracture with routine healing: Secondary | ICD-10-CM | POA: Diagnosis not present

## 2020-08-02 DIAGNOSIS — I10 Essential (primary) hypertension: Secondary | ICD-10-CM | POA: Diagnosis not present

## 2020-08-03 DIAGNOSIS — S82831D Other fracture of upper and lower end of right fibula, subsequent encounter for closed fracture with routine healing: Secondary | ICD-10-CM | POA: Diagnosis not present

## 2020-08-03 DIAGNOSIS — M25571 Pain in right ankle and joints of right foot: Secondary | ICD-10-CM | POA: Diagnosis not present

## 2020-08-04 DIAGNOSIS — Z9181 History of falling: Secondary | ICD-10-CM | POA: Diagnosis not present

## 2020-08-04 DIAGNOSIS — I10 Essential (primary) hypertension: Secondary | ICD-10-CM | POA: Diagnosis not present

## 2020-08-04 DIAGNOSIS — Z7901 Long term (current) use of anticoagulants: Secondary | ICD-10-CM | POA: Diagnosis not present

## 2020-08-04 DIAGNOSIS — E785 Hyperlipidemia, unspecified: Secondary | ICD-10-CM | POA: Diagnosis not present

## 2020-08-04 DIAGNOSIS — R69 Illness, unspecified: Secondary | ICD-10-CM | POA: Diagnosis not present

## 2020-08-04 DIAGNOSIS — I2510 Atherosclerotic heart disease of native coronary artery without angina pectoris: Secondary | ICD-10-CM | POA: Diagnosis not present

## 2020-08-04 DIAGNOSIS — M109 Gout, unspecified: Secondary | ICD-10-CM | POA: Diagnosis not present

## 2020-08-04 DIAGNOSIS — Z8673 Personal history of transient ischemic attack (TIA), and cerebral infarction without residual deficits: Secondary | ICD-10-CM | POA: Diagnosis not present

## 2020-08-04 DIAGNOSIS — S8261XD Displaced fracture of lateral malleolus of right fibula, subsequent encounter for closed fracture with routine healing: Secondary | ICD-10-CM | POA: Diagnosis not present

## 2020-08-04 DIAGNOSIS — M1711 Unilateral primary osteoarthritis, right knee: Secondary | ICD-10-CM | POA: Diagnosis not present

## 2020-08-04 DIAGNOSIS — I4891 Unspecified atrial fibrillation: Secondary | ICD-10-CM | POA: Diagnosis not present

## 2020-08-04 DIAGNOSIS — Z951 Presence of aortocoronary bypass graft: Secondary | ICD-10-CM | POA: Diagnosis not present

## 2020-08-05 ENCOUNTER — Observation Stay (HOSPITAL_COMMUNITY): Payer: Medicare HMO

## 2020-08-05 ENCOUNTER — Emergency Department (HOSPITAL_COMMUNITY): Payer: Medicare HMO

## 2020-08-05 ENCOUNTER — Inpatient Hospital Stay (HOSPITAL_COMMUNITY)
Admission: EM | Admit: 2020-08-05 | Discharge: 2020-08-11 | DRG: 065 | Disposition: A | Discharge status: Rehabilitation Facility | Payer: Medicare HMO | Attending: Internal Medicine | Admitting: Internal Medicine

## 2020-08-05 ENCOUNTER — Encounter (HOSPITAL_COMMUNITY): Payer: Self-pay | Admitting: Internal Medicine

## 2020-08-05 ENCOUNTER — Other Ambulatory Visit: Payer: Self-pay

## 2020-08-05 DIAGNOSIS — I4891 Unspecified atrial fibrillation: Secondary | ICD-10-CM | POA: Diagnosis not present

## 2020-08-05 DIAGNOSIS — X58XXXA Exposure to other specified factors, initial encounter: Secondary | ICD-10-CM | POA: Diagnosis present

## 2020-08-05 DIAGNOSIS — R29700 NIHSS score 0: Secondary | ICD-10-CM | POA: Diagnosis present

## 2020-08-05 DIAGNOSIS — M109 Gout, unspecified: Secondary | ICD-10-CM | POA: Diagnosis present

## 2020-08-05 DIAGNOSIS — Z79899 Other long term (current) drug therapy: Secondary | ICD-10-CM

## 2020-08-05 DIAGNOSIS — E538 Deficiency of other specified B group vitamins: Secondary | ICD-10-CM | POA: Diagnosis present

## 2020-08-05 DIAGNOSIS — F419 Anxiety disorder, unspecified: Secondary | ICD-10-CM | POA: Diagnosis present

## 2020-08-05 DIAGNOSIS — S82891A Other fracture of right lower leg, initial encounter for closed fracture: Secondary | ICD-10-CM | POA: Diagnosis present

## 2020-08-05 DIAGNOSIS — Z8249 Family history of ischemic heart disease and other diseases of the circulatory system: Secondary | ICD-10-CM

## 2020-08-05 DIAGNOSIS — I4819 Other persistent atrial fibrillation: Secondary | ICD-10-CM | POA: Diagnosis present

## 2020-08-05 DIAGNOSIS — M1712 Unilateral primary osteoarthritis, left knee: Secondary | ICD-10-CM | POA: Diagnosis present

## 2020-08-05 DIAGNOSIS — E669 Obesity, unspecified: Secondary | ICD-10-CM | POA: Diagnosis present

## 2020-08-05 DIAGNOSIS — Z951 Presence of aortocoronary bypass graft: Secondary | ICD-10-CM

## 2020-08-05 DIAGNOSIS — R42 Dizziness and giddiness: Secondary | ICD-10-CM | POA: Diagnosis not present

## 2020-08-05 DIAGNOSIS — I639 Cerebral infarction, unspecified: Principal | ICD-10-CM | POA: Diagnosis present

## 2020-08-05 DIAGNOSIS — R29810 Facial weakness: Secondary | ICD-10-CM | POA: Diagnosis not present

## 2020-08-05 DIAGNOSIS — G459 Transient cerebral ischemic attack, unspecified: Secondary | ICD-10-CM | POA: Insufficient documentation

## 2020-08-05 DIAGNOSIS — I10 Essential (primary) hypertension: Secondary | ICD-10-CM | POA: Diagnosis present

## 2020-08-05 DIAGNOSIS — N471 Phimosis: Secondary | ICD-10-CM | POA: Diagnosis present

## 2020-08-05 DIAGNOSIS — Z743 Need for continuous supervision: Secondary | ICD-10-CM | POA: Diagnosis not present

## 2020-08-05 DIAGNOSIS — I480 Paroxysmal atrial fibrillation: Secondary | ICD-10-CM | POA: Diagnosis present

## 2020-08-05 DIAGNOSIS — R29898 Other symptoms and signs involving the musculoskeletal system: Secondary | ICD-10-CM | POA: Diagnosis not present

## 2020-08-05 DIAGNOSIS — I2510 Atherosclerotic heart disease of native coronary artery without angina pectoris: Secondary | ICD-10-CM | POA: Diagnosis present

## 2020-08-05 DIAGNOSIS — Z6830 Body mass index (BMI) 30.0-30.9, adult: Secondary | ICD-10-CM

## 2020-08-05 DIAGNOSIS — Z823 Family history of stroke: Secondary | ICD-10-CM

## 2020-08-05 DIAGNOSIS — R531 Weakness: Secondary | ICD-10-CM | POA: Diagnosis not present

## 2020-08-05 DIAGNOSIS — I69354 Hemiplegia and hemiparesis following cerebral infarction affecting left non-dominant side: Secondary | ICD-10-CM

## 2020-08-05 DIAGNOSIS — E785 Hyperlipidemia, unspecified: Secondary | ICD-10-CM | POA: Diagnosis present

## 2020-08-05 DIAGNOSIS — Z20822 Contact with and (suspected) exposure to covid-19: Secondary | ICD-10-CM | POA: Diagnosis present

## 2020-08-05 HISTORY — DX: Strain of muscle, fascia and tendon of other parts of biceps, unspecified arm, initial encounter: S46.219A

## 2020-08-05 LAB — RAPID URINE DRUG SCREEN, HOSP PERFORMED
Amphetamines: NOT DETECTED
Barbiturates: NOT DETECTED
Benzodiazepines: NOT DETECTED
Cocaine: NOT DETECTED
Opiates: NOT DETECTED
Tetrahydrocannabinol: NOT DETECTED

## 2020-08-05 LAB — PROTIME-INR
INR: 2.2 — ABNORMAL HIGH (ref 0.8–1.2)
Prothrombin Time: 23.5 seconds — ABNORMAL HIGH (ref 11.4–15.2)

## 2020-08-05 LAB — COMPREHENSIVE METABOLIC PANEL
ALT: 22 U/L (ref 0–44)
AST: 29 U/L (ref 15–41)
Albumin: 3.3 g/dL — ABNORMAL LOW (ref 3.5–5.0)
Alkaline Phosphatase: 74 U/L (ref 38–126)
Anion gap: 11 (ref 5–15)
BUN: 17 mg/dL (ref 8–23)
CO2: 24 mmol/L (ref 22–32)
Calcium: 9.3 mg/dL (ref 8.9–10.3)
Chloride: 103 mmol/L (ref 98–111)
Creatinine, Ser: 1.16 mg/dL (ref 0.61–1.24)
GFR, Estimated: >60 mL/min (ref >60)
Glucose, Bld: 132 mg/dL — ABNORMAL HIGH (ref 70–99)
Potassium: 3.8 mmol/L (ref 3.5–5.1)
Sodium: 138 mmol/L (ref 135–145)
Total Bilirubin: 1.3 mg/dL — ABNORMAL HIGH (ref 0.3–1.2)
Total Protein: 7.1 g/dL (ref 6.5–8.1)

## 2020-08-05 LAB — URINALYSIS, ROUTINE W REFLEX MICROSCOPIC
Bilirubin Urine: NEGATIVE
Glucose, UA: NEGATIVE mg/dL
Hgb urine dipstick: NEGATIVE
Ketones, ur: NEGATIVE mg/dL
Leukocytes,Ua: NEGATIVE
Nitrite: NEGATIVE
Protein, ur: NEGATIVE mg/dL
Specific Gravity, Urine: 1.006 (ref 1.005–1.030)
pH: 6 (ref 5.0–8.0)

## 2020-08-05 LAB — CBC
HCT: 48.8 % (ref 39.0–52.0)
Hemoglobin: 15.4 g/dL (ref 13.0–17.0)
MCH: 29.6 pg (ref 26.0–34.0)
MCHC: 31.6 g/dL (ref 30.0–36.0)
MCV: 93.8 fL (ref 80.0–100.0)
Platelets: 194 10*3/uL (ref 150–400)
RBC: 5.2 MIL/uL (ref 4.22–5.81)
RDW: 13.4 % (ref 11.5–15.5)
WBC: 8.7 10*3/uL (ref 4.0–10.5)
nRBC: 0 % (ref 0.0–0.2)

## 2020-08-05 LAB — DIFFERENTIAL
Abs Immature Granulocytes: 0.03 10*3/uL (ref 0.00–0.07)
Basophils Absolute: 0.1 10*3/uL (ref 0.0–0.1)
Basophils Relative: 1 %
Eosinophils Absolute: 0.1 10*3/uL (ref 0.0–0.5)
Eosinophils Relative: 2 %
Immature Granulocytes: 0 %
Lymphocytes Relative: 20 %
Lymphs Abs: 1.8 10*3/uL (ref 0.7–4.0)
Monocytes Absolute: 0.7 10*3/uL (ref 0.1–1.0)
Monocytes Relative: 8 %
Neutro Abs: 6 10*3/uL (ref 1.7–7.7)
Neutrophils Relative %: 69 %

## 2020-08-05 LAB — I-STAT CHEM 8, ED
BUN: 28 mg/dL — ABNORMAL HIGH (ref 8–23)
Calcium, Ion: 1.04 mmol/L — ABNORMAL LOW (ref 1.15–1.40)
Chloride: 104 mmol/L (ref 98–111)
Creatinine, Ser: 1 mg/dL (ref 0.61–1.24)
Glucose, Bld: 130 mg/dL — ABNORMAL HIGH (ref 70–99)
HCT: 50 % (ref 39.0–52.0)
Hemoglobin: 17 g/dL (ref 13.0–17.0)
Potassium: 5.4 mmol/L — ABNORMAL HIGH (ref 3.5–5.1)
Sodium: 136 mmol/L (ref 135–145)
TCO2: 25 mmol/L (ref 22–32)

## 2020-08-05 LAB — TSH: TSH: 2.493 u[IU]/mL (ref 0.350–4.500)

## 2020-08-05 LAB — SARS CORONAVIRUS 2 (TAT 6-24 HRS): SARS Coronavirus 2: NEGATIVE

## 2020-08-05 LAB — ETHANOL: Alcohol, Ethyl (B): <10 mg/dL (ref <10)

## 2020-08-05 LAB — APTT: aPTT: 48 seconds — ABNORMAL HIGH (ref 24–36)

## 2020-08-05 MED ORDER — OXYCODONE-ACETAMINOPHEN 5-325 MG PO TABS: 1.0000 | ORAL_TABLET | Freq: Four times a day (QID) | ORAL | Status: DC | PRN

## 2020-08-05 MED ORDER — SODIUM CHLORIDE 0.9 % IV SOLN: INTRAVENOUS | Status: DC

## 2020-08-05 MED ORDER — RIVAROXABAN 20 MG PO TABS
20.0000 mg | ORAL_TABLET | Freq: Every day | ORAL | Status: DC
  Administered 2020-08-05 – 2020-08-10 (×6): 20 mg via ORAL
  Filled 2020-08-05 (×7): qty 1

## 2020-08-05 MED ORDER — ALLOPURINOL 100 MG PO TABS
100.0000 mg | ORAL_TABLET | Freq: Every day | ORAL | Status: DC
  Administered 2020-08-06 – 2020-08-11 (×6): 100 mg via ORAL
  Filled 2020-08-05 (×6): qty 1

## 2020-08-05 MED ORDER — ACETAMINOPHEN 325 MG PO TABS: 650.0000 mg | ORAL_TABLET | ORAL | Status: DC | PRN

## 2020-08-05 MED ORDER — HYDROCODONE-ACETAMINOPHEN 5-325 MG PO TABS
1.0000 | ORAL_TABLET | ORAL | Status: DC | PRN
  Administered 2020-08-06: 1 via ORAL
  Filled 2020-08-05: qty 1

## 2020-08-05 MED ORDER — LORAZEPAM 2 MG/ML IJ SOLN: 1.0000 mg | Freq: Once | INTRAMUSCULAR | Status: AC

## 2020-08-05 MED ORDER — ACETAMINOPHEN 160 MG/5ML PO SOLN: 650.0000 mg | ORAL | Status: DC | PRN

## 2020-08-05 MED ORDER — PROSIGHT PO TABS
1.0000 | ORAL_TABLET | Freq: Every day | ORAL | Status: DC
  Administered 2020-08-06 – 2020-08-11 (×6): 1 via ORAL
  Filled 2020-08-05 (×7): qty 1

## 2020-08-05 MED ORDER — ACETAMINOPHEN 650 MG RE SUPP: 650.0000 mg | RECTAL | Status: DC | PRN

## 2020-08-05 MED ORDER — ASPIRIN 300 MG RE SUPP
300.0000 mg | Freq: Every day | RECTAL | Status: DC
  Filled 2020-08-05: qty 1

## 2020-08-05 MED ORDER — LORAZEPAM 2 MG/ML IJ SOLN
INTRAMUSCULAR | Status: AC
  Administered 2020-08-05: 1 mg via INTRAVENOUS
  Filled 2020-08-05: qty 1

## 2020-08-05 MED ORDER — ATORVASTATIN CALCIUM 40 MG PO TABS
40.0000 mg | ORAL_TABLET | Freq: Every day | ORAL | Status: DC
  Administered 2020-08-05 – 2020-08-10 (×6): 40 mg via ORAL
  Filled 2020-08-05 (×6): qty 1

## 2020-08-05 MED ORDER — SENNOSIDES-DOCUSATE SODIUM 8.6-50 MG PO TABS: 1.0000 | ORAL_TABLET | Freq: Every evening | ORAL | Status: DC | PRN

## 2020-08-05 MED ORDER — GADOBUTROL 1 MMOL/ML IV SOLN
9.5000 mL | Freq: Once | INTRAVENOUS | Status: AC | PRN
  Administered 2020-08-05: 9.5 mL via INTRAVENOUS

## 2020-08-05 MED ORDER — ASPIRIN 325 MG PO TABS
325.0000 mg | ORAL_TABLET | Freq: Every day | ORAL | Status: DC
  Administered 2020-08-06 – 2020-08-10 (×5): 325 mg via ORAL
  Filled 2020-08-05 (×5): qty 1

## 2020-08-05 MED ORDER — STROKE: EARLY STAGES OF RECOVERY BOOK: Freq: Once | Status: DC

## 2020-08-05 NOTE — ED Provider Notes (Signed)
[facility] EMERGENCY DEPARTMENT Provider Note   CSN: [id number] Arrival date & time: 08/05/20  2025     History Chief Complaint  Patient presents with  . Weakness    Shane Espinoza is a 80 y.o. male.  HPI 80 year old male presents with acute left-sided weakness. He was finally allowed to be weightbearing on his right leg and this morning he was walking around with his walker. Was doing okay until he had to go back to the kitchen and then noticed acute left arm weakness and some leg weakness. Sat down and was starting to feel lightheaded. Overall the symptoms resolved by the time EMS arrived which was around 8:30 AM. Symptoms started around 740 or so. When he woke up at 7 AM he was fine. Right now he feels like he is back to or as close as possible back to normal. No headache, visual symptoms or speech problems.   Past Medical History:  Diagnosis Date  . Anxiety   . Atrial fibrillation ([place])   . CAD (coronary artery disease)    [name]  . Gout   . Hypertension   . 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 TIA (transient ischemic attack) 08/05/2020  . Gout 12/07/2013  . Atrial fibrillation ([place]) 12/07/2013  . Acute thrombotic stroke ([place]) 09/23/2013  . CVA (cerebral infarction) 09/23/2013  . HTN (hypertension) 04/16/2013  . Hyperlipidemia with target LDL less than 70 04/16/2013  . CAD (coronary artery disease)   . Anxiety   . Hypertension   . S/P CABG x 4     Past Surgical History:  Procedure Laterality Date  . CARDIAC CATHETERIZATION  03/02/2011   Recommended CABG  . COLON SURGERY    . CORONARY ARTERY BYPASS GRAFT  03/07/2011   DR.VAN  TRIGHT  . EYE SURGERY    . LEXISCAN MYOVIEW  02/20/2012   EKG negative for ischemia, noraml study  . TRANSTHORACIC ECHOCARDIOGRAM  02/20/2012   EF 50-55%, mild-moderate concentric LVH, LA moderately dilated, moderate mitral regurg  . VASCULAR DOPPLER   03/03/2011   Nosignificant extracranial carotid artery stenosis       Family History  Problem Relation Age of Onset  . CVA Father   . Heart attack Maternal Grandmother   . Heart attack Maternal Grandfather   . Heart murmur Sister   . Hypertension Son     Social History   Tobacco Use  . Smoking status: Never Smoker  . Smokeless tobacco: Never Used  Substance Use Topics  . Alcohol use: No  . Drug use: No    Home Medications Prior to Admission medications   Medication Sig Start Date End Date Taking? Authorizing Provider  acetaminophen (TYLENOL) 500 MG tablet Take 500 mg by mouth every 6 (six) hours as needed for moderate pain.   Yes [provider]  allopurinol (ZYLOPRIM) 100 MG tablet Take 1 tablet by mouth daily. 05/22/14  Yes [provider]  amLODipine (NORVASC) 5 MG tablet Take 2.5 mg by mouth every evening. 07/12/20  Yes [provider]  Cholecalciferol (VITAMIN D3) 5000 UNITS CAPS Take by mouth. Monday, Wednesday, Friday   Yes [provider]  fish oil-omega-3 fatty acids 1000 MG capsule Take 1 g by mouth 3 (three) times a week.   Yes [provider]  furosemide (LASIX) 20 MG tablet TAKE 1 TABLET DAILY, TAKE 1EXTRA TABLET AS NEEDED FOR SWELLING  01/27/20  Yes Troy Sine, MD  HYDROcodone-acetaminophen (NORCO/VICODIN) 5-325 MG tablet Take 1 tablet by mouth every 4 (four) hours as needed for moderate pain or severe pain. 07/09/20  Yes [provider]  metoprolol succinate (TOPROL-XL) 25 MG 24 hr tablet Take 1 tablet (25 mg total) by mouth daily. 10/24/19  Yes Troy Sine, MD  oxyCODONE-acetaminophen (PERCOCET/ROXICET) 5-325 MG tablet Take 1 tablet by mouth every 6 (six) hours as needed for moderate pain or severe pain. 07/13/20  Yes [provider]  Probiotic Product (ALIGN) 4 MG CAPS Take 1 capsule by mouth every other day.   Yes [provider]  simvastatin (ZOCOR) 20 MG tablet Take 1 tablet (20 mg  total) by mouth at bedtime. 10/24/19  Yes Troy Sine, MD  spironolactone (ALDACTONE) 25 MG tablet Take 0.5 tablets (12.5 mg total) by mouth daily. 10/24/19  Yes Troy Sine, MD  valsartan (DIOVAN) 320 MG tablet TAKE 1 TABLET DAILY        (DISCONTINUE IRBESARTAN DUETO BACK ORDER) 04/12/20  Yes Troy Sine, MD  vitamin B-12 (CYANOCOBALAMIN) 1000 MCG tablet Take 1,000 mcg by mouth every evening.   Yes [provider]  XARELTO 20 MG TABS tablet TAKE 1 TABLET DAILY WITH   SUPPER 05/04/20  Yes Troy Sine, MD  beta carotene w/minerals (OCUVITE) tablet Take 1 tablet by mouth daily.      [provider]  colchicine 0.6 MG tablet Take 0.6 mg by mouth as needed.    [provider]    Allergies    Patient has no known allergies.  Review of Systems   Review of Systems  Eyes: Negative for visual disturbance.  Neurological: Positive for weakness and light-headedness. Negative for speech difficulty and headaches.  All other systems reviewed and are negative.   Physical Exam Updated Vital Signs BP 124/79   Pulse 71   Temp 97.8 F (36.6 C) (Oral)   Resp 13   SpO2 98%   Physical Exam Vitals and nursing note reviewed.  Constitutional:      General: He is not in acute distress.    Appearance: He is well-developed and well-nourished. He is not ill-appearing or diaphoretic.  HENT:     Head: Normocephalic and atraumatic.     Right Ear: External ear normal.     Left Ear: External ear normal.     Nose: Nose normal.  Eyes:     General:        Right eye: No discharge.        Left eye: No discharge.     Extraocular Movements: Extraocular movements intact.     Pupils: Pupils are equal, round, and reactive to light.  Cardiovascular:     Rate and Rhythm: Normal rate and regular rhythm.     Heart sounds: Normal heart sounds.  Pulmonary:     Effort: Pulmonary effort is normal.     Breath sounds: Normal breath sounds.  Abdominal:     Palpations: Abdomen is  soft.     Tenderness: There is no abdominal tenderness.  Musculoskeletal:        General: No edema.     Cervical back: Neck supple.  Skin:    General: Skin is warm and dry.  Neurological:     Mental Status: He is alert.     Comments: CN 3-12 grossly intact. 5/5 strength in all 4 extremities. Grossly normal sensation. Normal finger to nose on the right. Patient has to  take more time with finger to nose on left but does not overshoot.  Psychiatric:        Mood and Affect: Mood is not anxious.     ED Results / Procedures / Treatments   Labs (all labs ordered are listed, but only abnormal results are displayed) Labs Reviewed  PROTIME-INR - Abnormal; Notable for the following components:      Result Value   Prothrombin Time 23.5 (*)    INR 2.2 (*)    All other components within normal limits  APTT - Abnormal; Notable for the following components:   aPTT 48 (*)    All other components within normal limits  COMPREHENSIVE METABOLIC PANEL - Abnormal; Notable for the following components:   Glucose, Bld 132 (*)    Albumin 3.3 (*)    Total Bilirubin 1.3 (*)    All other components within normal limits  I-STAT CHEM 8, ED - Abnormal; Notable for the following components:   Potassium 5.4 (*)    BUN 28 (*)    Glucose, Bld 130 (*)    Calcium, Ion 1.04 (*)    All other components within normal limits  SARS CORONAVIRUS 2 (TAT 6-24 HRS)  ETHANOL  CBC  DIFFERENTIAL  RAPID URINE DRUG SCREEN, HOSP PERFORMED  URINALYSIS, ROUTINE W REFLEX MICROSCOPIC    EKG EKG Interpretation  Date/Time:  Thursday August 05 2020 09:23:05 EST Ventricular Rate:  88 PR Interval:    QRS Duration: 97 QT Interval:  379 QTC Calculation: 459 R Axis:   25 Text Interpretation: Atrial fibrillation Low voltage, precordial leads Confirmed by Sherwood Gambler [phone]) on 08/05/2020 9:27:57 AM   Radiology CT HEAD WO CONTRAST  Result Date: 08/05/2020 CLINICAL DATA:  Transient ischemic attack, episode of LEFT-sided  weakness and dizziness today lasting 1 hour, symptoms resolved EXAM: CT HEAD WITHOUT CONTRAST TECHNIQUE: Contiguous axial images were obtained from the base of the skull through the vertex without intravenous contrast. Sagittal and coronal MPR images reconstructed from axial data set. COMPARISON:  09/23/2013 FINDINGS: Brain: Generalized atrophy. Normal ventricular morphology. No midline shift or mass effect. Small vessel chronic ischemic changes of deep cerebral white matter. No intracranial hemorrhage, mass lesion, evidence of acute infarction, or extra-axial fluid collection. Vascular: Atherosclerotic calcification of internal carotid and vertebral arteries at skull base Skull: Intact Sinuses/Orbits: Scattered mucosal thickening in ethmoid air cells and sphenoid sinus. Minimal fluid RIGHT maxillary sinus. Other: N/A IMPRESSION: Atrophy with small vessel chronic ischemic changes of deep cerebral white matter. No acute intracranial abnormalities. Electronically Signed   By: Lavonia Dana M.D.   On: 08/05/2020 11:19    Procedures Procedures   Medications Ordered in ED Medications - No data to display  ED Course  I have reviewed the triage vital signs and the nursing notes.  Pertinent labs & imaging results that were available during my care of the patient were reviewed by me and considered in my medical decision making (see chart for details).    MDM Rules/Calculators/A&P                          Patient's presentation is consistent with a TIA.  No recurrence of symptoms while in the emergency department.  CT head has been reviewed and is overall benign. Labs are unremarkable.  Discussed with Dr. Theda Sers, who recommends MRI and MRA head and neck and admission for TIA work-up.  Dr. Lorin Mercy to admit. Has been compliant with xarelto. Final Clinical Impression(s) /  ED Diagnoses Final diagnoses:  TIA (transient ischemic attack)    Rx / DC Orders ED Discharge Orders    None       Sherwood Gambler,  MD 08/05/20 1345

## 2020-08-05 NOTE — H&P (Addendum)
History and Physical    Shane Espinoza HYW:[id number] DOB: 1941/01/25 DOA: 08/05/2020  PCP: Josetta Huddle, MD Consultants:  Stann Mainland - orthopedics; Claiborne Billings - cardiology; Prescott Gum - CVTS; [place] - urology Patient coming from:  Home - lives with wife; Shane Espinoza: Wife, [phone]; [phone]  Chief Complaint: L weakness  HPI: Shane Espinoza is a 80 y.o. male with medical history significant of CAD s/p CABG x 4; CVA (2015); HTN; and afib on Xarelto presenting with L-sided weakness. About 4 weeks ago, he injured his R ankle - fracture, no weight bearing.  He saw the doctor 2 days ago and was allowed to start weight bearing.  He is concerned that he did too much.  His left side gave way and he almost colalpased with L sided weakness of arm and leg.  He had some light-headedness as well.  He still feels a "presence" in his left arm but it is better, leg symptoms are resolved.  No dysarthria this time.  No dysphagia.      ED Course:  Remote CVA, injured ankle recently.  Walking with walker and then sudden onset L-sided weakness.  Lasted about an hour, has resolved.  Neuro to see but requests observation, MRI/MRA.    Review of Systems: As per HPI; otherwise review of systems reviewed and negative.   Ambulatory Status:  Ambulates with a walker, wheelchair for distance  COVID Vaccine Status: Complete plus booster  Past Medical History:  Diagnosis Date  . Anxiety   . Atrial fibrillation ([place])   . CAD (coronary artery disease)    Shane Espinoza  . Gout   . Hypertension   . 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 CARDIAC CATHETERIZATION  03/02/2011   Recommended CABG  . COLON SURGERY    . CORONARY ARTERY BYPASS GRAFT  03/07/2011   DR.VAN  Espinoza  . EYE SURGERY    . LEXISCAN MYOVIEW  02/20/2012   EKG negative for ischemia, noraml study  . TRANSTHORACIC ECHOCARDIOGRAM  02/20/2012   EF 50-55%, mild-moderate concentric LVH, LA  moderately dilated, moderate mitral regurg  . VASCULAR DOPPLER  03/03/2011   Nosignificant extracranial carotid artery stenosis    Social History   Socioeconomic History  . Marital status: Married    Spouse name: Not on file  . Number of children: 2  . Years of education: college  . Highest education level: Not on file  Occupational History  . Occupation: retitred  Tobacco Use  . Smoking status: Never Smoker  . Smokeless tobacco: Never Used  Substance and Sexual Activity  . Alcohol use: No  . Drug use: No  . Sexual activity: Never  Other Topics Concern  . Not on file  Social History Narrative  . Not on file   Social Determinants of Health   Financial Resource Strain: Not on file  Food Insecurity: Not on file  Transportation Needs: Not on file  Physical Activity: Not on file  Stress: Not on file  Social Connections: Not on file  Intimate Partner Violence: Not on file    No Known Allergies  Family History  Problem Relation Age of Onset  . CVA Father   . Heart attack Maternal Grandmother   . Heart attack Maternal Grandfather   . Heart murmur Sister   . Hypertension Son     Prior to Admission medications   Medication Sig Start Date End Date Taking? Authorizing Provider  allopurinol (ZYLOPRIM) 100 MG tablet Take 1 tablet by mouth daily. 05/22/14   [provider]  amLODipine (NORVASC) 2.5 MG tablet Take 2.5 mg by mouth daily.    [provider]  beta carotene w/minerals (OCUVITE) tablet Take 1 tablet by mouth daily.      [provider]  Cholecalciferol (VITAMIN D3) 5000 UNITS CAPS Take by mouth. Monday, Wednesday, Friday    [provider]  colchicine 0.6 MG tablet Take 0.6 mg by mouth as needed.    [provider]  fish oil-omega-3 fatty acids 1000 MG capsule Take 500 mg by mouth daily.     [provider]  fluorouracil (EFUDEX) 5 % cream APP EXT AA Q NIGHT 05/27/19   [provider]  furosemide  (LASIX) 20 MG tablet TAKE 1 TABLET DAILY, TAKE 1EXTRA TABLET AS NEEDED FOR SWELLING 01/27/20   Troy Sine, MD  imiquimod [place]) 5 % cream Apply 1 application topically as directed. 04/29/16   [provider]  metoprolol succinate (TOPROL-XL) 25 MG 24 hr tablet Take 1 tablet (25 mg total) by mouth daily. 10/24/19   Troy Sine, MD  simvastatin (ZOCOR) 20 MG tablet Take 1 tablet (20 mg total) by mouth at bedtime. 10/24/19   Troy Sine, MD  spironolactone (ALDACTONE) 25 MG tablet Take 0.5 tablets (12.5 mg total) by mouth daily. 10/24/19   Troy Sine, MD  valsartan (DIOVAN) 320 MG tablet TAKE 1 TABLET DAILY        (DISCONTINUE IRBESARTAN DUETO BACK ORDER) 04/12/20   Troy Sine, MD  XARELTO 20 MG TABS tablet TAKE 1 TABLET DAILY WITH   SUPPER 05/04/20   Troy Sine, MD    Physical Exam: Vitals:   08/05/20 0925 08/05/20 1030 08/05/20 1230  BP: 126/65 125/66 124/79  Pulse: 88 86 71  Resp: 15 15 13   Temp: 97.8 F (36.6 C)    TempSrc: Oral    SpO2: 98% 98% 98%     . General:  Appears calm and comfortable and is in NAD . Eyes:  PERRL, EOMI, normal lids, iris . ENT:  Hard of hearing, grossly normal lips & tongue, mmm; appropriate dentition . Neck:  no LAD, masses or thyromegaly; no carotid bruits . Cardiovascular:  Irregularly irregular with normal rate, no m/r/g. No LE edema.  Marland Kitchen Respiratory:   CTA bilaterally with no wheezes/rales/rhonchi.  Normal respiratory effort. . Abdomen:  soft, NT, ND, NABS . Skin:  no rash or induration seen on limited exam . Musculoskeletal:  RLE is in a CAM walker to almost the knee.  Distal circulation appears to be intact and he is able to lift the (heavy) boot.  LLE does appear to be weaker, although it is difficult to tell whether the issue is more related to difficult differentiating between his LE in the setting of chronic R ankle issue. UE strength appears symmetrical . Psychiatric:  grossly normal mood and affect, speech fluent  and appropriate, AOx3 . Neurologic:  CN 2-12 grossly intact, moves all extremities in coordinated fashion, sensation intact    Radiological Exams on Admission: Independently reviewed - see discussion in A/P where applicable  CT HEAD WO CONTRAST  Result Date: 08/05/2020 CLINICAL DATA:  Transient ischemic attack, episode of LEFT-sided weakness and dizziness today lasting 1 hour, symptoms resolved EXAM: CT HEAD WITHOUT CONTRAST TECHNIQUE: Contiguous axial images were obtained from the base of the skull through the vertex without intravenous contrast. Sagittal and coronal MPR images reconstructed  from axial data set. COMPARISON:  09/23/2013 FINDINGS: Brain: Generalized atrophy. Normal ventricular morphology. No midline shift or mass effect. Small vessel chronic ischemic changes of deep cerebral white matter. No intracranial hemorrhage, mass lesion, evidence of acute infarction, or extra-axial fluid collection. Vascular: Atherosclerotic calcification of internal carotid and vertebral arteries at skull base Skull: Intact Sinuses/Orbits: Scattered mucosal thickening in ethmoid air cells and sphenoid sinus. Minimal fluid RIGHT maxillary sinus. Other: N/A IMPRESSION: Atrophy with small vessel chronic ischemic changes of deep cerebral white matter. No acute intracranial abnormalities. Electronically Signed   By: Lavonia Dana M.D.   On: 08/05/2020 11:19    EKG: Independently reviewed.  Afib with rate 88; low voltage with no evidence of acute ischemia   Labs on Admission: I have personally reviewed the available labs and imaging studies at the time of the admission.  Pertinent labs:   Glucose 132 Albumin 3.3 Bili 1.3 Normal CBC INR 2.2 UA WNL ETOH <10 UDS negative   Assessment/Plan Principal Problem:   TIA (transient ischemic attack) Active Problems:   Hypertension   S/P CABG x 4   Hyperlipidemia with target LDL less than 70   Atrial fibrillation (HCC)   TIA/CVA -Patient presenting with  acute onset of L-sided weakness -Symptoms may have resolved completely, although LLE weakness appeared to be present at the time of my exam -Also with ?mild cognitive impairment (wife denies) -Concerning for TIA/CVA -ABCD2 score is 4 -Aspirin has been given to reduce stroke mortality and decrease morbidity -Will place in observation status for CVA/TIA evaluation -Telemetry monitoring -MRI/MRA -Carotid dopplers; if ipsilateral carotid stenosis is detected then prompt vascular surgery consultation is needed for consideration of CEA. -Echo -Risk stratification with FLP, A1c; will also check TSH  -Patient will need DAPT for 21 days when ABCD2 score is at least 4 and NIH score is 3 or less, and then can transition to monotherapy with a single antiplatelet agent.  Will defer to neurology for now. -Neurology consult -PT/OT/ST/Nutrition Consults  HTN -Allow permissive HTN for now -Treat BP only if >220/120, and then with goal of 15% reduction -Hold Norvasc, Toprol XL, Diovan, Lasix, Aldactone and plan to restart in 48-72 hours   HLD -Check FLP -Resume statin but will change Zocor to Lipitor 40 mg daily -Hold fish oil for now due to limited inpatient utility   CAD s/p CABG -Remote CABG x 4v -No current c/o chest pain  Afib -Rate controlled with Toprol - but holding for permissive HTN -Continue Xarelto     Note: This patient has been tested and is pending for the novel coronavirus COVID-19. She has been fully vaccinated against COVID-19.    DVT prophylaxis:  Lovenox  Code Status: Full - confirmed with patient/family Family Communication: Wife present throughout evaluation Disposition Plan:  The patient is from: home  Anticipated d/c is to: home, possibly with [facility] services  Anticipated d/c date will depend on clinical response to treatment, but possibly as early as tomorrow if he has excellent response to treatment  Patient is currently: acutely ill Consults called: Neurology;  PT/OT/ST/Nutrition; [facility] team Admission status: It is my clinical opinion that referral for OBSERVATION is reasonable and necessary in this patient based on the above information provided. The aforementioned taken together are felt to place the patient at high risk for further clinical deterioration. However it is anticipated that the patient may be medically stable for discharge from the hospital within 24 to 48 hours.   Karmen Bongo MD [facility]  How to contact the [facility] Attending or Consulting provider [place] or covering provider during after hours [place], for this patient?  1. Check the care team in [facility] and look for a) attending/consulting [facility] provider listed and b) the [facility] team listed 2. Log into [email or web address] and use [place] universal password to access. If you do not have the password, please contact the hospital operator. 3. Locate the [facility] provider you are looking for under [facility] and page to a number that you can be directly reached. 4. If you still have difficulty reaching the provider, please page the [facility] (Director on Call) for the Hospitalists listed on amion for assistance.   08/05/2020, 2:52 PM

## 2020-08-05 NOTE — ED Notes (Signed)
Pt to MRI

## 2020-08-05 NOTE — ED Triage Notes (Signed)
Patient BIB [facility] for left sided weakness that started at 0740 this morning. Patient states he felt normal at 0700 when he took his morning medicines, then noticed lightheadedness and left sided weakness starting at 0740 at which point he sat down without falling. Reports history of stroke in 2015. EMS reports diminished left grip strength and decreased coordination on their arrival to patients home but symptoms resolved shortly after. Patient asymptomatic and without complaints at this time.

## 2020-08-05 NOTE — Consult Note (Signed)
Neurology Consult H&P  CC: left sided weakness  History is obtained from: Patient and Wife  HPI: Shane Espinoza is a 80 y.o. male right hand PMHx CAD s/p CABG x 4; CVA (2015); HTN; and afib (rivaroxaban) presenting with acute L-sided weakness. The patient was walking with his walker and then noticed acute left arm weakness, shakoing and some leg weakness. He sat down and starting to feel lightheaded. The symptoms resolved by the time EMS arrived ~0830. Symptoms started around 0740 or so. When he woke up at 7 AM he was fine. Right now he feels like he is back to or as close as possible back to normal. No headache, visual symptoms or speech problems.   LKW: unclear tpa given?: No, OSW IR Thrombectomy? No,  Modified Rankin Scale: 1-No significant post stroke disability and can perform usual duties with stroke symptoms NIHSS: 0  ROS: A complete ROS was performed and is negative except as noted in the HPI.   Past Medical History:  Diagnosis Date  . Anxiety   . Atrial fibrillation ([place])   . CAD (coronary artery disease)    [name]  . Gout   . Hypertension   . 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   [name]     Family History  Problem Relation Age of Onset  . CVA Father   . Heart attack Maternal Grandmother   . Heart attack Maternal Grandfather   . Heart murmur Sister   . Hypertension Son     Social History:  reports that he has never smoked. He has never used smokeless tobacco. He reports that he does not drink alcohol and does not use drugs.   Prior to Admission medications   Medication Sig Start Date End Date Taking? Authorizing Provider  acetaminophen (TYLENOL) 500 MG tablet Take 500 mg by mouth every 6 (six) hours as needed for moderate pain.   Yes [provider]  allopurinol (ZYLOPRIM) 100 MG tablet Take 1 tablet by mouth daily. 05/22/14  Yes [provider]  amLODipine (NORVASC) 5 MG tablet Take 2.5 mg by mouth every evening. 07/12/20  Yes [provider]  beta carotene w/minerals (OCUVITE) tablet Take 1 tablet by mouth daily.   Yes [provider]  Cholecalciferol (VITAMIN D3) 5000 UNITS CAPS Take by mouth. Monday, Wednesday, Friday   Yes [provider]  fish oil-omega-3 fatty acids 1000 MG capsule Take 1 g by mouth 3 (three) times a week.   Yes [provider]  furosemide (LASIX) 20 MG tablet TAKE 1 TABLET DAILY, TAKE 1EXTRA TABLET AS NEEDED FOR SWELLING 01/27/20  Yes Troy Sine, MD  HYDROcodone-acetaminophen (NORCO/VICODIN) 5-325 MG tablet Take 1 tablet by mouth every 4 (four) hours as needed for moderate pain or severe pain. 07/09/20  Yes [provider]  metoprolol succinate (TOPROL-XL) 25 MG 24 hr tablet Take 1 tablet (25 mg total) by mouth daily. 10/24/19  Yes Troy Sine, MD  oxyCODONE-acetaminophen (PERCOCET/ROXICET) 5-325 MG tablet Take 1 tablet by mouth every 6 (six) hours as needed for moderate pain or severe pain. 07/13/20  Yes [provider]  Probiotic Product (ALIGN) 4 MG CAPS Take 1 capsule by mouth every other day.   Yes [provider]  simvastatin (ZOCOR) 20 MG tablet Take 1 tablet (20 mg total) by mouth at bedtime. 10/24/19  Yes Troy Sine, MD  spironolactone (ALDACTONE) 25 MG tablet Take 0.5 tablets (12.5 mg total) by mouth daily. 10/24/19  Yes Claiborne Billings,  Joyice Faster, MD  valsartan (DIOVAN) 320 MG tablet TAKE 1 TABLET DAILY        (DISCONTINUE IRBESARTAN DUETO BACK ORDER) 04/12/20  Yes Troy Sine, MD  vitamin B-12 (CYANOCOBALAMIN) 1000 MCG tablet Take 1,000 mcg by mouth every evening.   Yes [provider]  XARELTO 20 MG TABS tablet TAKE 1 TABLET DAILY WITH   SUPPER 05/04/20  Yes Troy Sine, MD    Exam: Current vital signs: BP 124/79   Pulse 71   Temp 97.8 F (36.6 C) (Oral)   Resp 13   SpO2 98%   Physical Exam  Constitutional: Appears well-developed and well-nourished.  Psych: Affect appropriate to situation Eyes: No scleral  injection HENT: No OP obstruction. Head: Normocephalic.  Cardiovascular: Normal rate and regular rhythm.  Respiratory: Effort normal, symmetric excursions bilaterally, no audible wheezing. GI: Soft.  No distension. There is no tenderness.  Skin: WDI  Neuro: Mental Status: Patient is awake, alert, oriented to person, place, month, year, and situation. Patient is able to give a clear and coherent history. No signs of aphasia or neglect. Cranial Nerves: II: Visual Fields are full. Pupils are equal, round, and reactive to light. III,IV, VI: EOMI without ptosis or diploplia.  V: Facial sensation is symmetric to temperature VII: Facial movement is symmetric.  VIII: hearing is intact to voice X: Uvula elevates symmetrically XI: Shoulder shrug is symmetric. XII: tongue is midline without atrophy or fasciculations.  Motor: Tone is normal. Bulk is normal. 5/5 strength was present in all four extremities. Sensory: Sensation is symmetric to light touch and temperature in the arms and legs. Deep Tendon Reflexes: 2+ and symmetric in the biceps and patellae. Plantars: Toes are downgoing bilaterally. Cerebellar: FNF and HKS are intact bilaterally.  I have reviewed labs in epic and the pertinent results are:   Ref. Range 08/05/2020 10:58  Potassium Latest Ref Range: 3.5 - 5.1 mmol/L 5.4 (H)    I have reviewed the images obtained: NCT head showed atrophy with small vessel chronic ischemic changes of deep cerebral white matter. MRI brain showed focus of restricted diffusion consistent with acute/subacute infarct in the periventricular white matter adjacent to the body of the right lateral ventricle. MRA head and neck showed decreased signal at the carotid bifurcation bilaterally consistent with stenosis which is difficult to quantify. Possible severe stenosis at the left carotid bifurcation. Probable bilateral carotid bifurcation stenosis left greater than right.  Assessment: Shane Espinoza is a 80 y.o. male right hand PMHx CAD s/p CABG x 4; CVA (2015); HTN; and afib (rivaroxaban) presenting with acute L-sided weakness found to have acuter ischemic stroke in the periventricular white matter adjacent to the body of the right lateral ventricle/body of the caudate.  Plan: - Recommend repeat vascular imaging carotid ultrasound. - Recommend TTE. - Recommend labs: HbA1c, lipid panel. - Recommend Statin if LDL > 70 - Continue rivaroxaban. - Permissive hypertension first 24 h < 220/110.  - Telemetry monitoring for arrhythmia. - Recommend bedside Swallow screen. - Recommend Stroke education. - Recommend PT/OT/SLP consult.  Electronically signed by:  Lynnae Sandhoff, MD Page: [phone] 08/05/2020, 4:49 PM

## 2020-08-05 NOTE — Progress Notes (Signed)
Patient arrived to room in NAD, VS stable and patient free from pain. Patient oriented to room and call bell in reach.  

## 2020-08-05 NOTE — ED Notes (Signed)
Pt has not returned from MRI; confirmed with floor pt is to be admitted and pt has not arrived there. Pt still in MRI.

## 2020-08-06 ENCOUNTER — Observation Stay (HOSPITAL_BASED_OUTPATIENT_CLINIC_OR_DEPARTMENT_OTHER): Payer: Medicare HMO

## 2020-08-06 ENCOUNTER — Observation Stay (HOSPITAL_COMMUNITY): Payer: Medicare HMO

## 2020-08-06 DIAGNOSIS — G459 Transient cerebral ischemic attack, unspecified: Secondary | ICD-10-CM

## 2020-08-06 DIAGNOSIS — I639 Cerebral infarction, unspecified: Secondary | ICD-10-CM | POA: Diagnosis not present

## 2020-08-06 DIAGNOSIS — I4819 Other persistent atrial fibrillation: Secondary | ICD-10-CM | POA: Diagnosis not present

## 2020-08-06 DIAGNOSIS — I6350 Cerebral infarction due to unspecified occlusion or stenosis of unspecified cerebral artery: Secondary | ICD-10-CM | POA: Diagnosis not present

## 2020-08-06 DIAGNOSIS — I63231 Cerebral infarction due to unspecified occlusion or stenosis of right carotid arteries: Secondary | ICD-10-CM | POA: Diagnosis not present

## 2020-08-06 DIAGNOSIS — Z823 Family history of stroke: Secondary | ICD-10-CM | POA: Diagnosis not present

## 2020-08-06 DIAGNOSIS — Z6830 Body mass index (BMI) 30.0-30.9, adult: Secondary | ICD-10-CM | POA: Diagnosis not present

## 2020-08-06 DIAGNOSIS — E538 Deficiency of other specified B group vitamins: Secondary | ICD-10-CM | POA: Diagnosis present

## 2020-08-06 DIAGNOSIS — R69 Illness, unspecified: Secondary | ICD-10-CM | POA: Diagnosis not present

## 2020-08-06 DIAGNOSIS — E669 Obesity, unspecified: Secondary | ICD-10-CM | POA: Diagnosis present

## 2020-08-06 DIAGNOSIS — S82891A Other fracture of right lower leg, initial encounter for closed fracture: Secondary | ICD-10-CM | POA: Diagnosis present

## 2020-08-06 DIAGNOSIS — M1712 Unilateral primary osteoarthritis, left knee: Secondary | ICD-10-CM | POA: Diagnosis present

## 2020-08-06 DIAGNOSIS — F419 Anxiety disorder, unspecified: Secondary | ICD-10-CM | POA: Diagnosis present

## 2020-08-06 DIAGNOSIS — N471 Phimosis: Secondary | ICD-10-CM | POA: Diagnosis present

## 2020-08-06 DIAGNOSIS — E1169 Type 2 diabetes mellitus with other specified complication: Secondary | ICD-10-CM | POA: Diagnosis not present

## 2020-08-06 DIAGNOSIS — X58XXXA Exposure to other specified factors, initial encounter: Secondary | ICD-10-CM | POA: Diagnosis not present

## 2020-08-06 DIAGNOSIS — Z79899 Other long term (current) drug therapy: Secondary | ICD-10-CM | POA: Diagnosis not present

## 2020-08-06 DIAGNOSIS — I4891 Unspecified atrial fibrillation: Secondary | ICD-10-CM | POA: Diagnosis not present

## 2020-08-06 DIAGNOSIS — Z8249 Family history of ischemic heart disease and other diseases of the circulatory system: Secondary | ICD-10-CM | POA: Diagnosis not present

## 2020-08-06 DIAGNOSIS — I69354 Hemiplegia and hemiparesis following cerebral infarction affecting left non-dominant side: Secondary | ICD-10-CM | POA: Diagnosis not present

## 2020-08-06 DIAGNOSIS — S82891S Other fracture of right lower leg, sequela: Secondary | ICD-10-CM | POA: Diagnosis not present

## 2020-08-06 DIAGNOSIS — R29700 NIHSS score 0: Secondary | ICD-10-CM | POA: Diagnosis present

## 2020-08-06 DIAGNOSIS — Z20822 Contact with and (suspected) exposure to covid-19: Secondary | ICD-10-CM | POA: Diagnosis not present

## 2020-08-06 DIAGNOSIS — I63031 Cerebral infarction due to thrombosis of right carotid artery: Secondary | ICD-10-CM | POA: Diagnosis not present

## 2020-08-06 DIAGNOSIS — M109 Gout, unspecified: Secondary | ICD-10-CM | POA: Diagnosis not present

## 2020-08-06 DIAGNOSIS — E785 Hyperlipidemia, unspecified: Secondary | ICD-10-CM | POA: Diagnosis present

## 2020-08-06 DIAGNOSIS — I2510 Atherosclerotic heart disease of native coronary artery without angina pectoris: Secondary | ICD-10-CM | POA: Diagnosis not present

## 2020-08-06 DIAGNOSIS — Z951 Presence of aortocoronary bypass graft: Secondary | ICD-10-CM | POA: Diagnosis not present

## 2020-08-06 DIAGNOSIS — I6309 Cerebral infarction due to thrombosis of other precerebral artery: Secondary | ICD-10-CM | POA: Diagnosis not present

## 2020-08-06 DIAGNOSIS — I10 Essential (primary) hypertension: Secondary | ICD-10-CM | POA: Diagnosis not present

## 2020-08-06 LAB — ECHOCARDIOGRAM COMPLETE
Calc EF: 46.5 %
Height: 69 in
MV M vel: 5.16 m/s
MV Peak grad: 106.5 mmHg
S' Lateral: 3.7 cm
Single Plane A2C EF: 48 %
Single Plane A4C EF: 45.6 %
Weight: 3305.14 oz

## 2020-08-06 LAB — LIPID PANEL
Cholesterol: 101 mg/dL (ref 0–200)
HDL: 36 mg/dL — ABNORMAL LOW (ref >40)
LDL Cholesterol: 45 mg/dL (ref 0–99)
Total CHOL/HDL Ratio: 2.8 RATIO
Triglycerides: 102 mg/dL (ref <150)
VLDL: 20 mg/dL (ref 0–40)

## 2020-08-06 LAB — HEMOGLOBIN A1C
Hgb A1c MFr Bld: 6.5 % — ABNORMAL HIGH (ref 4.8–5.6)
Mean Plasma Glucose: 139.85 mg/dL

## 2020-08-06 NOTE — Progress Notes (Signed)
PT Cancellation Note  Patient Details Name: CORNELL BOURBON MRN: [id number] DOB: [birth year]   Cancelled Treatment:    Reason Eval/Treat Not Completed: Patient at procedure or test/unavailable. Will check back as schedule allows to initiate PT evaluation.   Thelma Comp 08/06/2020, 11:58 AM   Rolinda Roan, PT, DPT Acute Rehabilitation Services Pager: [phone] Office: [phone]

## 2020-08-06 NOTE — Progress Notes (Signed)
Nutrition Brief Note  RD consulted for assessment of nutrition requirements/status.  Wt Readings from Last 15 Encounters:  08/06/20 93.7 kg  07/08/20 95.7 kg  04/20/20 97.3 kg  06/17/19 98.2 kg  06/04/18 98.2 kg  04/26/17 94.4 kg  02/20/17 94.3 kg  05/30/16 94.5 kg  05/26/15 92.1 kg  12/01/14 94 kg  06/03/14 90.6 kg  12/05/13 92.8 kg  11/25/13 93.9 kg  10/09/13 92.9 kg  09/23/13 92.8 kg    Body mass index is 30.51 kg/m. Patient meets criteria for class I obesity based on current BMI. Pt with no significant weight loss.   Current diet order is heart healthy, patient is consuming approximately 100% of meals at this time. Pt reports having a good appetite currently and PTA with no difficulties. Labs and medications reviewed.   No nutrition interventions warranted at this time. If nutrition issues arise, please consult RD.   Corrin Parker, MS, RD, LDN RD pager number/after hours weekend pager number on Amion.

## 2020-08-06 NOTE — Progress Notes (Addendum)
PROGRESS NOTE    Shane Espinoza  KNL:[id number] DOB: 1940/09/10 DOA: 08/05/2020 PCP: Josetta Huddle, MD  Outpatient Specialists:   Brief Narrative:  As per H&P done by Dr. Karmen Bongo: "Shane Espinoza is a 80 y.o. male with medical history significant of CAD s/p CABG x 4; CVA (2015); HTN; and afib on Xarelto presenting with L-sided weakness. About 4 weeks ago, he injured his R ankle - fracture, no weight bearing.  He saw the doctor 2 days ago and was allowed to start weight bearing.  He is concerned that he did too much.  His left side gave way and he almost colalpased with L sided weakness of arm and leg.  He had some light-headedness as well.  He still feels a "presence" in his left arm but it is better, leg symptoms are resolved.  No dysarthria this time.  No dysphagia.    ED Course:  Remote CVA, injured ankle recently.  Walking with walker and then sudden onset L-sided weakness.  Lasted about an hour, has resolved.  Neuro to see but requests observation, MRI/MRA".  08/06/2020:  Seen alongside patient's wife.  Left-sided weakness is improving.  MRI brain revealed acute/subacute infarct in the periventricular white matter adjacent to the body of the right lateral ventricle.  MRA head without contrast was normal.  MRA neck contrast revealed double bilateral carotid bifurcation stenosis left greater than right.  The result of the vascular Ultrasound of the Carotid Is Pending.  Neurology is directing care.  PT has recommended a CIR.  08/07/2020: Patient seen alongside patient's wife.  Patient continues to improve.  Awaiting CIR   Assessment & Plan:   Principal Problem:   TIA (transient ischemic attack) Active Problems:   Hypertension   S/P CABG x 4   Hyperlipidemia with target LDL less than 70   Atrial fibrillation (HCC)  TIA/CVA -Patient presenting with acute onset of L-sided weakness -As per physical therapy notes, patient still has functional limitations. -CIR has been  recommended. -Neurology is directing care, including but not limited to antiplatelets and anticoagulation.. -Telemetry monitoring -MRI brain revealed acute and subacute infarcts. -Carotid doppler result is pending. -Follow other work-up.  MRA head is nonrevealing.  MRA neck result is noted.  Echo revealed EF of 55 to 60%, mild MR and mild to moderate aortic sclerosis/calcification without aortic stenosis. -HbA1c 6.5%. -Physical therapy input is highly appreciated. 08/07/2020: Patient is awaiting CIR  HTN -Allow permissive HTNfor now -Treat BP only if >220/120, and then with goal of 15% reduction -Continue to holdNorvasc, Toprol XL, Diovan, Lasix, Aldactoneand plan to restart in 48-72 hours 2022: Blood pressure remains controlled.  HLD -LDL is 45. -Total cholesterol is 101. -Patient is currently on Lipitor 40 Mg p.o. nightly.  CAD s/p CABG -Remote CABG x 4v -No current c/o chest pain  Afib -Rate controlled with Toprol - but holding for permissive HTN -Continue Xarelto. -There are concerns and how the patient may be taking Xarelto.    DVT prophylaxis: Xarelto. Code Status: Full code Family Communication: Wife Disposition Plan: CIR   Consultants:   Neurology  Procedures:   Work-up for acute CVA/TIA same progress  Antimicrobials:   None   Subjective: -Left-sided weakness is improving.  Objective: Vitals:   08/05/20 2200 08/06/20 0022 08/06/20 0215 08/06/20 0420  BP: 117/76 118/71 128/79 113/79  Pulse: 84 70 76 73  Resp: 19 18 18 18   Temp: 98.1 F (36.7 C) 98 F (36.7 C) 98.4 F (36.9 C) 98.1 F (  36.7 C)  TempSrc: Oral Oral Oral Oral  SpO2: 99% 96% 96% 98%  Weight:    93.7 kg  Height:        Intake/Output Summary (Last 24 hours) at 08/06/2020 1143 Last data filed at 08/06/2020 0400 Gross per 24 hour  Intake 441.8 ml  Output 500 ml  Net -58.2 ml   Filed Weights   08/05/20 1740 08/06/20 0420  Weight: 92.5 kg 93.7 kg     Examination:  General exam: Appears calm and comfortable  Respiratory system: Clear to auscultation. Respiratory effort normal. Cardiovascular system: S1 & S2, irregularly irregular Gastrointestinal system: Abdomen is mildly obese, soft and nontender. No organomegaly or masses felt. Normal bowel sounds heard. Central nervous system: Alert and oriented.  Left-sided weakness is improving. Extremities: No leg edema  Data Reviewed: I have personally reviewed following labs and imaging studies  CBC: Recent Labs  Lab 08/05/20 1033 08/05/20 1058  WBC 8.7  --   NEUTROABS 6.0  --   HGB 15.4 17.0  HCT 48.8 50.0  MCV 93.8  --   PLT 194  --    Basic Metabolic Panel: Recent Labs  Lab 08/05/20 1033 08/05/20 1058  NA 138 136  K 3.8 5.4*  CL 103 104  CO2 24  --   GLUCOSE 132* 130*  BUN 17 28*  CREATININE 1.16 1.00  CALCIUM 9.3  --    GFR: Estimated Creatinine Clearance: 66.6 mL/min (by C-G formula based on SCr of 1 mg/dL). Liver Function Tests: Recent Labs  Lab 08/05/20 1033  AST 29  ALT 22  ALKPHOS 74  BILITOT 1.3*  PROT 7.1  ALBUMIN 3.3*   No results for input(s): LIPASE, AMYLASE in the last 168 hours. No results for input(s): AMMONIA in the last 168 hours. Coagulation Profile: Recent Labs  Lab 08/05/20 1033  INR 2.2*   Cardiac Enzymes: No results for input(s): CKTOTAL, CKMB, CKMBINDEX, TROPONINI in the last 168 hours. BNP (last 3 results) No results for input(s): PROBNP in the last 8760 hours. HbA1C: Recent Labs    08/06/20 0604  HGBA1C 6.5*   CBG: No results for input(s): GLUCAP in the last 168 hours. Lipid Profile: Recent Labs    08/06/20 0604  CHOL 101  HDL 36*  LDLCALC 45  TRIG 102  CHOLHDL 2.8   Thyroid Function Tests: Recent Labs    08/05/20 1845  TSH 2.493   Anemia Panel: No results for input(s): VITAMINB12, FOLATE, FERRITIN, TIBC, IRON, RETICCTPCT in the last 72 hours. Urine analysis:    Component Value Date/Time   COLORURINE  YELLOW 08/05/2020 1033   APPEARANCEUR CLEAR 08/05/2020 1033   LABSPEC 1.006 08/05/2020 1033   PHURINE 6.0 08/05/2020 1033   GLUCOSEU NEGATIVE 08/05/2020 1033   HGBUR NEGATIVE 08/05/2020 1033   BILIRUBINUR NEGATIVE 08/05/2020 1033   KETONESUR NEGATIVE 08/05/2020 1033   PROTEINUR NEGATIVE 08/05/2020 1033   UROBILINOGEN 1.0 03/09/2011 0849   NITRITE NEGATIVE 08/05/2020 1033   LEUKOCYTESUR NEGATIVE 08/05/2020 1033   Sepsis Labs: @LABRCNTIP (procalcitonin:4,lacticidven:4)  ) Recent Results (from the past 240 hour(s))  SARS CORONAVIRUS 2 (TAT 6-24 HRS) Nasopharyngeal Nasopharyngeal Swab     Status: None   Collection Time: 08/05/20  1:15 PM   Specimen: Nasopharyngeal Swab  Result Value Ref Range Status   SARS Coronavirus 2 NEGATIVE NEGATIVE Final    Comment: (NOTE) SARS-CoV-2 target nucleic acids are NOT DETECTED.  The SARS-CoV-2 RNA is generally detectable in upper and lower respiratory specimens during the acute phase of infection.  Negative results do not preclude SARS-CoV-2 infection, do not rule out co-infections with other pathogens, and should not be used as the sole basis for treatment or other patient management decisions. Negative results must be combined with clinical observations, patient history, and epidemiological information. The expected result is Negative.  Fact Sheet for Patients: SugarRoll.be  Fact Sheet for Healthcare Providers: https://www.woods-mathews.com/  This test is not yet approved or cleared by the [place] FDA and  has been authorized for detection and/or diagnosis of SARS-CoV-2 by FDA under an Emergency Use Authorization (EUA). This EUA will remain  in effect (meaning this test can be used) for the duration of the COVID-19 declaration under Se ction 564(b)(1) of the Act, 21 U.S.C. section 360bbb-3(b)(1), unless the authorization is terminated or revoked sooner.  Performed at [facility],  [place] [address] [place], [place] [address]          Radiology Studies: CT HEAD WO CONTRAST  Result Date: 08/05/2020 CLINICAL DATA:  Transient ischemic attack, episode of LEFT-sided weakness and dizziness today lasting 1 hour, symptoms resolved EXAM: CT HEAD WITHOUT CONTRAST TECHNIQUE: Contiguous axial images were obtained from the base of the skull through the vertex without intravenous contrast. Sagittal and coronal MPR images reconstructed from axial data set. COMPARISON:  09/23/2013 FINDINGS: Brain: Generalized atrophy. Normal ventricular morphology. No midline shift or mass effect. Small vessel chronic ischemic changes of deep cerebral white matter. No intracranial hemorrhage, mass lesion, evidence of acute infarction, or extra-axial fluid collection. Vascular: Atherosclerotic calcification of internal carotid and vertebral arteries at skull base Skull: Intact Sinuses/Orbits: Scattered mucosal thickening in ethmoid air cells and sphenoid sinus. Minimal fluid RIGHT maxillary sinus. Other: N/A IMPRESSION: Atrophy with small vessel chronic ischemic changes of deep cerebral white matter. No acute intracranial abnormalities. Electronically Signed   By: Lavonia Dana M.D.   On: 08/05/2020 11:19   MR ANGIO HEAD WO CONTRAST  Result Date: 08/05/2020 CLINICAL DATA:  Transient ischemic attack. EXAM: MRA HEAD WITHOUT CONTRAST TECHNIQUE: Angiographic images of the Circle of Willis were obtained using MRA technique without intravenous contrast. COMPARISON:  None. FINDINGS: The visualized portions of the distal cervical and intracranial internal carotid arteries are widely patent with normal flow related enhancement. The bilateral anterior cerebral arteries and middle cerebral arteries are widely patent with antegrade flow without high-grade flow-limiting stenosis or proximal branch occlusion. No intracranial aneurysm within the anterior circulation. The vertebral arteries are widely patent with antegrade flow.  Vertebrobasilar junction and basilar artery are widely patent with antegrade flow without evidence of basilar stenosis or aneurysm. Posterior cerebral arteries are normal bilaterally. No intracranial aneurysm within the posterior circulation. IMPRESSION: Normal intracranial MRA. Electronically Signed   By: Pedro Earls M.D.   On: 08/05/2020 16:45   MR Angiogram Neck W or Wo Contrast  Result Date: 08/05/2020 CLINICAL DATA:  Stroke EXAM: MRA NECK WITHOUT AND WITH CONTRAST TECHNIQUE: Multiplanar and multiecho pulse sequences of the neck were obtained without and with intravenous contrast. Angiographic images of the neck were obtained using MRA technique without and with intravenous contrast. CONTRAST:  9.25mL GADAVIST GADOBUTROL 1 MMOL/ML IV SOLN COMPARISON:  MRI head 08/05/2020 FINDINGS: Image quality degraded by extensive motion. In addition, there is suboptimal arterial enhancement Decreased signal at the carotid bifurcation bilaterally consistent with stenosis which is difficult to quantify. Possible severe stenosis at the left carotid bifurcation. Both vertebral arteries appear patent with antegrade flow. IMPRESSION: Examination limited by motion and suboptimal arterial opacification Probable bilateral carotid  bifurcation stenosis left greater than right. Correlate with CTA or Doppler. Electronically Signed   By: Franchot Gallo M.D.   On: 08/05/2020 17:02   MR BRAIN WO CONTRAST  Result Date: 08/05/2020 CLINICAL DATA:  Transient ischemic attack. EXAM: MRI HEAD WITHOUT CONTRAST TECHNIQUE: Multiplanar, multiecho pulse sequences of the brain and surrounding structures were obtained without intravenous contrast. COMPARISON:  Head CT July 05, 2020. FINDINGS: Brain: Focus of restricted diffusion consistent with acute/subacute infarct in the periventricular white matter adjacent to the body of the right lateral ventricle. No hemorrhage, hydrocephalus, extra-axial collection or mass lesion.  Scattered foci of T2 hyperintensity are seen in the white matter of the cerebral hemispheres, nonspecific. Moderate parenchymal volume loss. Vascular: Normal flow voids. Skull and upper cervical spine: Normal marrow signal. Sinuses/Orbits: Mucosal thickening throughout the paranasal sinuses with fluid level within the left sphenoid sinus. Right lens surgery. Other: Mild bilateral mastoid effusions, right greater than left. IMPRESSION: 1. Focus of restricted diffusion consistent with acute/subacute infarct in the periventricular white matter adjacent to the body of the right lateral ventricle. 2. Scattered foci of T2 hyperintensity in the white matter of the cerebral hemispheres, nonspecific but may represent chronic microvascular ischemic changes. 3. Moderate parenchymal volume loss. 4. Paranasal sinus disease with fluid level within the left sphenoid sinus. Correlate for acute sinusitis. 5. Mild bilateral mastoid effusions, right greater than left. These results were called by telephone at the time of interpretation on 08/05/2020 at 4:16 pm to Dr. Thomasenia Bottoms, Who verbally acknowledged these results. Electronically Signed   By: Pedro Earls M.D.   On: 08/05/2020 16:18   VAS US CAROTID (at [facility] and [facility] only)  Result Date: 08/06/2020 Carotid Arterial Duplex Study Indications:       TIA. Risk Factors:      Hypertension, coronary artery disease. Comparison Study:  no prior Performing Technologist: Abram Sander RVS  Examination Guidelines: A complete evaluation includes B-mode imaging, spectral Doppler, color Doppler, and power Doppler as needed of all accessible portions of each vessel. Bilateral testing is considered an integral part of a complete examination. Limited examinations for reoccurring indications may be performed as noted.  Right Carotid Findings: +----------+--------+--------+--------+------------------+--------+           PSV cm/sEDV cm/sStenosisPlaque DescriptionComments  +----------+--------+--------+--------+------------------+--------+ CCA Prox  55      11              heterogenous               +----------+--------+--------+--------+------------------+--------+ CCA Distal37      7               heterogenous               +----------+--------+--------+--------+------------------+--------+ ICA Prox  47      14      1-39%   heterogenous               +----------+--------+--------+--------+------------------+--------+ ICA Distal90      21                                         +----------+--------+--------+--------+------------------+--------+ ECA       55      4                                          +----------+--------+--------+--------+------------------+--------+ +----------+--------+-------+--------+-------------------+  PSV cm/sEDV cmsDescribeArm Pressure (mmHG) +----------+--------+-------+--------+-------------------+ Subclavian70                                         +----------+--------+-------+--------+-------------------+ +---------+--------+--+--------+--+---------+ VertebralPSV cm/s54EDV cm/s10Antegrade +---------+--------+--+--------+--+---------+  Left Carotid Findings: +----------+--------+--------+--------+------------------+---------+           PSV cm/sEDV cm/sStenosisPlaque DescriptionComments  +----------+--------+--------+--------+------------------+---------+ CCA Prox  86      13              heterogenous                +----------+--------+--------+--------+------------------+---------+ CCA Distal43      7               heterogenous                +----------+--------+--------+--------+------------------+---------+ ICA Prox  67      24      1-39%   heterogenous      Shadowing +----------+--------+--------+--------+------------------+---------+ ICA Distal52      13                                           +----------+--------+--------+--------+------------------+---------+ ECA       64      9                                           +----------+--------+--------+--------+------------------+---------+ +----------+--------+--------+--------+-------------------+           PSV cm/sEDV cm/sDescribeArm Pressure (mmHG) +----------+--------+--------+--------+-------------------+ [id number]                                          +----------+--------+--------+--------+-------------------+ +---------+--------+--+--------+--+---------+ VertebralPSV cm/s40EDV cm/s12Antegrade +---------+--------+--+--------+--+---------+   Summary: Right Carotid: Velocities in the right ICA are consistent with a 1-39% stenosis. Left Carotid: Velocities in the left ICA are consistent with a 1-39% stenosis. Vertebrals: Bilateral vertebral arteries demonstrate antegrade flow. *See table(s) above for measurements and observations.     Preliminary         Scheduled Meds: .  stroke: mapping our early stages of recovery book   Does not apply Once  . allopurinol  100 mg Oral Daily  . aspirin  300 mg Rectal Daily   Or  . aspirin  325 mg Oral Daily  . atorvastatin  40 mg Oral QHS  . multivitamin  1 tablet Oral Daily  . rivaroxaban  20 mg Oral Q supper   Continuous Infusions: . sodium chloride 50 mL/hr at 08/05/20 1908     LOS: 0 days    Time spent: 25 minutes   Dana Allan, MD  [facility] Pager #: [phone] 7PM-7AM contact night coverage as above

## 2020-08-06 NOTE — Evaluation (Signed)
Physical Therapy Evaluation Patient Details Name: Shane Espinoza MRN: [id number] DOB: May 29, 1941 Today's Date: 08/06/2020   History of Present Illness  80 y.o. male right hand PMHx CAD s/p CABG x 4; CVA (2015); HTN; and afib (rivaroxaban) presenting with acute L-sided weakness. The patient was walking with his walker and then noticed acute left arm weakness, shaking and some leg weakness.MRI brain showed focus of restricted diffusion consistent with acute/subacute infarct in the periventricular white matter adjacent to the body of the right lateral ventricle  Clinical Impression  Pt admitted with above diagnosis. Pt currently with functional limitations due to the deficits listed below (see PT Problem List). At the time of PT eval pt was able to perform transfers and pre-gait ambulation with mod assist for balance support and safety. Stedy utilized for transfers. PTA pt independent until fall, sustaining ankle fracture, and then CVA. Both pt and wife are motivated and hopeful for CIR. Feel this patient would benefit from continued skilled rehab at the CIR level to maximize functional independence and safety prior to return home with wife.    Follow Up Recommendations CIR;Supervision for mobility/OOB    Equipment Recommendations  None recommended by PT    Recommendations for Other Services       Precautions / Restrictions Precautions Precautions: Fall Required Braces or Orthoses: Other Brace (Cam boot) Restrictions Weight Bearing Restrictions: Yes RLE Weight Bearing: Weight bearing as tolerated Other Position/Activity Restrictions: with [place]      Mobility  Bed Mobility Overal bed mobility: Needs Assistance Bed Mobility: Sit to Supine     Supine to sit: Mod assist;HOB elevated     General bed mobility comments: VC's for railing use. Pt was able to transition to EOB with mod assist for trunk elevation, to complete scooting to EOB with bed pad, and for anterior lean to  neutral.    Transfers Overall transfer level: Needs assistance   Transfers: Sit to/from Stand Sit to Stand: From elevated surface;Mod assist         General transfer comment: Pt was able to pull to stand from center bar of Stedy with mod assist. Multimodal cues to improve posture.  Ambulation/Gait             General Gait Details: Pre-gait activity of weight shifts (to R>L) and then finding neutral as pt leaning L in standing as well.  Stairs            Wheelchair Mobility    Modified Rankin (Stroke Patients Only)       Balance Overall balance assessment: Needs assistance;History of Falls Sitting-balance support: Feet supported;Bilateral upper extremity supported Sitting balance-Leahy Scale: Poor   Postural control: Posterior lean;Left lateral lean Standing balance support: Bilateral upper extremity supported Standing balance-Leahy Scale: Poor                               Pertinent Vitals/Pain Pain Assessment: Faces Faces Pain Scale: Hurts little more Pain Location: R knee and ankle; usually takes Tylenol Pain Descriptors / Indicators: Aching;Discomfort;Grimacing Pain Intervention(s): Limited activity within patient's tolerance;Monitored during session;Repositioned    Home Living Family/patient expects to be discharged to:: Private residence Living Arrangements: Spouse/significant other Available Help at Discharge: Available 24 hours/day Type of Home: House Home Access: Stairs to enter Entrance Stairs-Rails: Psychiatric nurse of Steps: 3 Home Layout: Two level Home Equipment: Environmental consultant - 2 wheels;Wheelchair - manual;Shower seat - built in;Grab bars - tub/shower;Grab bars - toilet;Bedside  commode;Cane - single point;Walker - 4 wheels      Prior Function Level of Independence: Independent with assistive device(s);Needs assistance (wife "watches")   Gait / Transfers Assistance Needed: recent fall; states because of bad  knee  ADL's / Homemaking Assistance Needed: up until fracture he was completing his ADLs independently; wife has been assisting        Hand Dominance   Dominant Hand: Right    Extremity/Trunk Assessment   Upper Extremity Assessment Upper Extremity Assessment: Defer to OT evaluation    Lower Extremity Assessment Lower Extremity Assessment: LLE deficits/detail;RLE deficits/detail RLE Deficits / Details: Prior fracture - WBAT in [place]. Gross AROM is WFL at hip and knee. Gross strength 4+/5 in quads, hamstrings, hip flexors. RLE Sensation: WNL LLE Deficits / Details: Gross strength 4- to 4/5 in quads, hamstrings, and hip flexors. LLE Sensation: WNL    Cervical / Trunk Assessment Cervical / Trunk Assessment: Other exceptions Cervical / Trunk Exceptions: posterior and L bias  Communication   Communication: HOH  Cognition Arousal/Alertness: Awake/alert Behavior During Therapy: WFL for tasks assessed/performed Overall Cognitive Status: Impaired/Different from baseline Area of Impairment: Attention;Memory;Safety/judgement;Awareness;Problem solving                   Current Attention Level: Selective Memory: Decreased short-term memory   Safety/Judgement: Decreased awareness of safety;Decreased awareness of deficits Awareness: Emergent Problem Solving: Slow processing General Comments: Wife states this is baseline however pt appears impaired. Unaware of falling toward L; VC required to teach pt how to move to EOB; will further assess      General Comments      Exercises     Assessment/Plan    PT Assessment Patient needs continued PT services  PT Problem List Decreased strength;Decreased activity tolerance;Decreased balance;Decreased mobility;Decreased coordination;Decreased knowledge of use of DME;Decreased safety awareness;Decreased knowledge of precautions;Decreased cognition       PT Treatment Interventions DME instruction;Gait training;Stair  training;Functional mobility training;Therapeutic activities;Therapeutic exercise;Neuromuscular re-education;Patient/family education;Cognitive remediation    PT Goals (Current goals can be found in the Care Plan section)  Acute Rehab PT Goals Patient Stated Goal: to get better PT Goal Formulation: With patient/family Time For Goal Achievement: 08/20/20 Potential to Achieve Goals: Good    Frequency Min 4X/week   Barriers to discharge        Co-evaluation               AM-PAC PT "6 Clicks" Mobility  Outcome Measure Help needed turning from your back to your side while in a flat bed without using bedrails?: A Lot Help needed moving from lying on your back to sitting on the side of a flat bed without using bedrails?: A Lot Help needed moving to and from a bed to a chair (including a wheelchair)?: A Lot Help needed standing up from a chair using your arms (e.g., wheelchair or bedside chair)?: A Lot Help needed to walk in hospital room?: Total Help needed climbing 3-5 steps with a railing? : Total 6 Click Score: 10    End of Session Equipment Utilized During Treatment: Gait belt Activity Tolerance: Patient tolerated treatment well Patient left: in chair;with call bell/phone within reach;with chair alarm set Nurse Communication: Mobility status PT Visit Diagnosis: Unsteadiness on feet (R26.81);Pain    Time: 3557-3220 PT Time Calculation (min) (ACUTE ONLY): 31 min   Charges:   PT Evaluation $PT Eval Moderate Complexity: 1 Mod PT Treatments $Gait Training: 8-22 mins        Rolinda Roan, PT, DPT  Acute Rehabilitation Services Pager: [phone] Office: [phone]   Thelma Comp 08/06/2020, 3:38 PM

## 2020-08-06 NOTE — Consult Note (Signed)
Physical Medicine and Rehabilitation Consult Reason for Consult: Left-sided weakness Referring Physician: [facility]   HPI: Shane Espinoza is a 80 y.o. right-handed male with history of anxiety, atrial fibrillation, CVA 2015, CAD with CABG 2012 maintained on Xarelto as well as recent right ankle nondisplaced fracture of the right lateral malleolus with additional findings as suggestive of a small avulsion fracture adjacent to the right medial malleolus maintained in a cam boot and weightbearing as tolerated.  Presented to 09/19/2020 with acute onset of left-sided weakness and lightheadedness.  CT/MRI showed focus of restricted diffusion consistent with acute subacute infarct in the periventricular white matter adjacent to the body of the right lateral ventricle.  MRA unremarkable.  Patient did not receive TPA.  Echocardiogram with ejection fraction of 55 to 60% no wall motion abnormalities grade 1 diastolic dysfunction..  Carotid Dopplers with no ICA stenosis.  Admission chemistries unremarkable except glucose 132, urinalysis positive nitrite.  Neurology consulted currently maintained on aspirin as well as Xarelto for CVA prophylaxis.  Tolerating a regular diet. Physical Medicine & Rehabilitation was consulted to assess candidacy for CIR given impaired mobility and ADLs.    Review of Systems  Constitutional: Negative for chills and fever.  HENT: Negative for hearing loss.   Eyes: Negative for blurred vision and double vision.  Respiratory: Negative for cough and shortness of breath.   Cardiovascular: Negative for chest pain, palpitations and leg swelling.  Gastrointestinal: Positive for constipation. Negative for heartburn, nausea and vomiting.  Genitourinary: Negative for flank pain and hematuria.  Musculoskeletal: Positive for myalgias.  Skin: Negative for rash.  Neurological: Positive for weakness.  Psychiatric/Behavioral:       Anxiety  All other systems reviewed and are  negative.  Past Medical History:  Diagnosis Date  . Anxiety   . Atrial fibrillation ([place])   . CAD (coronary artery disease)    [name]  . Gout   . Hypertension   . 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 CARDIAC CATHETERIZATION  03/02/2011   Recommended CABG  . COLON SURGERY    . CORONARY ARTERY BYPASS GRAFT  03/07/2011   DR.VAN  TRIGHT  . EYE SURGERY    . LEXISCAN MYOVIEW  02/20/2012   EKG negative for ischemia, noraml study  . TRANSTHORACIC ECHOCARDIOGRAM  02/20/2012   EF 50-55%, mild-moderate concentric LVH, LA moderately dilated, moderate mitral regurg  . VASCULAR DOPPLER  03/03/2011   Nosignificant extracranial carotid artery stenosis   Family History  Problem Relation Age of Onset  . CVA Father   . Heart attack Maternal Grandmother   . Heart attack Maternal Grandfather   . Heart murmur Sister   . Hypertension Son    Social History:  reports that he has never smoked. He has never used smokeless tobacco. He reports that he does not drink alcohol and does not use drugs. Allergies: No Known Allergies Medications Prior to Admission  Medication Sig Dispense Refill  . acetaminophen (TYLENOL) 500 MG tablet Take 500 mg by mouth every 6 (six) hours as needed for moderate pain.    Marland Kitchen allopurinol (ZYLOPRIM) 100 MG tablet Take 1 tablet by mouth daily.  2  . amLODipine (NORVASC) 5 MG tablet Take 2.5 mg by mouth every evening.    . beta carotene w/minerals (OCUVITE) tablet Take 1 tablet by mouth daily.    . Cholecalciferol (VITAMIN D3) 5000 UNITS CAPS Take by mouth. Monday,  Wednesday, Friday    . fish oil-omega-3 fatty acids 1000 MG capsule Take 1 g by mouth 3 (three) times a week.    . furosemide (LASIX) 20 MG tablet TAKE 1 TABLET DAILY, TAKE 1EXTRA TABLET AS NEEDED FOR SWELLING 180 tablet 3  . HYDROcodone-acetaminophen (NORCO/VICODIN) 5-325 MG tablet Take 1 tablet by mouth every 4 (four) hours as needed for moderate pain or  severe pain.    . metoprolol succinate (TOPROL-XL) 25 MG 24 hr tablet Take 1 tablet (25 mg total) by mouth daily. 90 tablet 3  . oxyCODONE-acetaminophen (PERCOCET/ROXICET) 5-325 MG tablet Take 1 tablet by mouth every 6 (six) hours as needed for moderate pain or severe pain.    . Probiotic Product (ALIGN) 4 MG CAPS Take 1 capsule by mouth every other day.    . simvastatin (ZOCOR) 20 MG tablet Take 1 tablet (20 mg total) by mouth at bedtime. 90 tablet 3  . spironolactone (ALDACTONE) 25 MG tablet Take 0.5 tablets (12.5 mg total) by mouth daily. 45 tablet 3  . valsartan (DIOVAN) 320 MG tablet TAKE 1 TABLET DAILY        (DISCONTINUE IRBESARTAN DUETO BACK ORDER) 90 tablet 3  . vitamin B-12 (CYANOCOBALAMIN) 1000 MCG tablet Take 1,000 mcg by mouth every evening.    Alveda Reasons 20 MG TABS tablet TAKE 1 TABLET DAILY WITH   SUPPER 90 tablet 1    Home: Home Living Family/patient expects to be discharged to:: Private residence Living Arrangements: Spouse/significant other Available Help at Discharge: Available 24 hours/day Type of Home: House Home Access: Stairs to enter [employer] of Steps: 3 Entrance Stairs-Rails: Right,Left Home Layout: Two level Alternate Level Stairs-Number of Steps: 13 steps (Has Stair lift) Bathroom Shower/Tub: Multimedia programmer: Handicapped height Bathroom Accessibility: Yes Home Equipment: Environmental consultant - 2 wheels,Wheelchair - manual,Shower seat - built in,Grab bars - tub/shower,Grab bars - toilet,Bedside commode,Cane - single point,Walker - 4 wheels  Functional History: Prior Function Level of Independence: Independent with assistive device(s),Needs assistance (wife "watches") Gait / Transfers Assistance Needed: recent fall; states because of bad knee ADL's / Homemaking Assistance Needed: up until fracture he was completing his ADLs independently; wife has been Orthoptist / Swallowing Assistance Needed: speech different from  baseline Functional Status:  Mobility: Bed Mobility Overal bed mobility: Needs Assistance Bed Mobility: Sit to Supine Supine to sit: Mod assist,HOB elevated Sit to supine: Mod assist General bed mobility comments: A to lift BLE onto bed Transfers Overall transfer level: Needs assistance Equipment used: 2 person hand held assist Transfers: Sit to/from [employer] Sit to Stand: Max assist Stand pivot transfers: Max assist,+2 physical assistance Squat pivot transfers: +2 safety/equipment,Mod assist General transfer comment: difficulty advancing/stepping LLE; R knee pain/difficulty wtih terminal extension      ADL: ADL Overall ADL's : Needs assistance/impaired Eating/Feeding: Set up Grooming: Supervision/safety,Set up,Sitting Upper Body Bathing: Minimal assistance,Sitting Lower Body Bathing: Maximal assistance,Sit to/from stand Upper Body Dressing : Moderate assistance,Sitting Lower Body Dressing: Maximal assistance,Sit to/from stand Toilet Transfer: Maximal assistance,+2 for safety/equipment Toileting- Clothing Manipulation and Hygiene: Total assistance Toileting - Clothing Manipulation Details (indicate cue type and reason): incontinenet of urine/BM; states he is unsure when he has to go Functional mobility during ADLs: Maximal assistance,+2 for safety/equipment,Rolling walker,Cueing for safety,Cueing for sequencing General ADL Comments: RW used to stand. Unable to safely trnasfer with RW due to L knee buckling and R knee weakness  Cognition: Cognition Overall Cognitive Status: Impaired/Different from baseline Orientation Level: Oriented X4 Cognition Arousal/Alertness:  Awake/alert Behavior During Therapy: WFL for tasks assessed/performed Overall Cognitive Status: Impaired/Different from baseline Area of Impairment: Attention,Memory,Safety/judgement,Awareness,Problem solving Current Attention Level: Selective Memory: Decreased short-term  memory Safety/Judgement: Decreased awareness of safety,Decreased awareness of deficits Awareness: Emergent Problem Solving: Slow processing General Comments: Wife states this is baseline however pt appears impaired. Unaware of falling toward L; VC required to teach pt how to move to EBO; will further assess  Blood pressure 113/79, pulse 73, temperature 98.1 F (36.7 C), temperature source Oral, resp. rate 18, height 5\' 9"  (1.753 m), weight 93.7 kg, SpO2 98 %. Physical Exam General: Alert and oriented x 3, No apparent distress HEENT: Head is normocephalic, atraumatic, PERRLA, EOMI, sclera anicteric, oral mucosa pink and moist, dentition intact, ext ear canals clear,  Neck: Supple without JVD or lymphadenopathy Heart: Reg rate and rhythm. No murmurs rubs or gallops Chest: CTA bilaterally without wheezes, rales, or rhonchi; no distress Abdomen: Soft, non-tender, non-distended, bowel sounds positive. Extremities: No clubbing, cyanosis, or edema. Pulses are 2+ Psych: Pt's affect is appropriate. Pt is cooperative Skin: Clean and intact without signs of breakdown Neurological:     Comments: Patient is alert in no acute distress oriented x3 and follows commands. Left sided strength 4/5, right sided 5/5 with the exceptio of right ankle- in boot.    Results for orders placed or performed during the hospital encounter of 08/05/20 (from the past 24 hour(s))  Ethanol     Status: None   Collection Time: 08/05/20 10:33 AM  Result Value Ref Range   Alcohol, Ethyl (B) <10 <10 mg/dL  Protime-INR     Status: Abnormal   Collection Time: 08/05/20 10:33 AM  Result Value Ref Range   Prothrombin Time 23.5 (H) 11.4 - 15.2 seconds   INR 2.2 (H) 0.8 - 1.2  APTT     Status: Abnormal   Collection Time: 08/05/20 10:33 AM  Result Value Ref Range   aPTT 48 (H) 24 - 36 seconds  CBC     Status: None   Collection Time: 08/05/20 10:33 AM  Result Value Ref Range   WBC 8.7 4.0 - 10.5 K/uL   RBC 5.20 4.22 - 5.81  MIL/uL   Hemoglobin 15.4 13.0 - 17.0 g/dL   HCT 48.8 39.0 - 52.0 %   MCV 93.8 80.0 - 100.0 fL   MCH 29.6 26.0 - 34.0 pg   MCHC 31.6 30.0 - 36.0 g/dL   RDW 13.4 11.5 - 15.5 %   Platelets 194 150 - 400 K/uL   nRBC 0.0 0.0 - 0.2 %  Differential     Status: None   Collection Time: 08/05/20 10:33 AM  Result Value Ref Range   Neutrophils Relative % 69 %   Neutro Abs 6.0 1.7 - 7.7 K/uL   Lymphocytes Relative 20 %   Lymphs Abs 1.8 0.7 - 4.0 K/uL   Monocytes Relative 8 %   Monocytes Absolute 0.7 0.1 - 1.0 K/uL   Eosinophils Relative 2 %   Eosinophils Absolute 0.1 0.0 - 0.5 K/uL   Basophils Relative 1 %   Basophils Absolute 0.1 0.0 - 0.1 K/uL   Immature Granulocytes 0 %   Abs Immature Granulocytes 0.03 0.00 - 0.07 K/uL  Comprehensive metabolic panel     Status: Abnormal   Collection Time: 08/05/20 10:33 AM  Result Value Ref Range   Sodium 138 135 - 145 mmol/L   Potassium 3.8 3.5 - 5.1 mmol/L   Chloride 103 98 - 111 mmol/L   CO2 24  22 - 32 mmol/L   Glucose, Bld 132 (H) 70 - 99 mg/dL   BUN 17 8 - 23 mg/dL   Creatinine, Ser 1.16 0.61 - 1.24 mg/dL   Calcium 9.3 8.9 - 10.3 mg/dL   Total Protein 7.1 6.5 - 8.1 g/dL   Albumin 3.3 (L) 3.5 - 5.0 g/dL   AST 29 15 - 41 U/L   ALT 22 0 - 44 U/L   Alkaline Phosphatase 74 38 - 126 U/L   Total Bilirubin 1.3 (H) 0.3 - 1.2 mg/dL   GFR, Estimated >60 >60 mL/min   Anion gap 11 5 - 15  Urine rapid drug screen (hosp performed)     Status: None   Collection Time: 08/05/20 10:33 AM  Result Value Ref Range   Opiates NONE DETECTED NONE DETECTED   Cocaine NONE DETECTED NONE DETECTED   Benzodiazepines NONE DETECTED NONE DETECTED   Amphetamines NONE DETECTED NONE DETECTED   Tetrahydrocannabinol NONE DETECTED NONE DETECTED   Barbiturates NONE DETECTED NONE DETECTED  Urinalysis, Routine w reflex microscopic Urine, Clean Catch     Status: None   Collection Time: 08/05/20 10:33 AM  Result Value Ref Range   Color, Urine YELLOW YELLOW   APPearance CLEAR  CLEAR   Specific Gravity, Urine 1.006 1.005 - 1.030   pH 6.0 5.0 - 8.0   Glucose, UA NEGATIVE NEGATIVE mg/dL   Hgb urine dipstick NEGATIVE NEGATIVE   Bilirubin Urine NEGATIVE NEGATIVE   Ketones, ur NEGATIVE NEGATIVE mg/dL   Protein, ur NEGATIVE NEGATIVE mg/dL   Nitrite NEGATIVE NEGATIVE   Leukocytes,Ua NEGATIVE NEGATIVE  I-stat chem 8, ED     Status: Abnormal   Collection Time: 08/05/20 10:58 AM  Result Value Ref Range   Sodium 136 135 - 145 mmol/L   Potassium 5.4 (H) 3.5 - 5.1 mmol/L   Chloride 104 98 - 111 mmol/L   BUN 28 (H) 8 - 23 mg/dL   Creatinine, Ser 1.00 0.61 - 1.24 mg/dL   Glucose, Bld 130 (H) 70 - 99 mg/dL   Calcium, Ion 1.04 (L) 1.15 - 1.40 mmol/L   TCO2 25 22 - 32 mmol/L   Hemoglobin 17.0 13.0 - 17.0 g/dL   HCT 50.0 39.0 - 52.0 %  SARS CORONAVIRUS 2 (TAT 6-24 HRS) Nasopharyngeal Nasopharyngeal Swab     Status: None   Collection Time: 08/05/20  1:15 PM   Specimen: Nasopharyngeal Swab  Result Value Ref Range   SARS Coronavirus 2 NEGATIVE NEGATIVE  TSH     Status: None   Collection Time: 08/05/20  6:45 PM  Result Value Ref Range   TSH 2.493 0.350 - 4.500 uIU/mL  Hemoglobin A1c     Status: Abnormal   Collection Time: 08/06/20  6:04 AM  Result Value Ref Range   Hgb A1c MFr Bld 6.5 (H) 4.8 - 5.6 %   Mean Plasma Glucose 139.85 mg/dL  Lipid panel     Status: Abnormal   Collection Time: 08/06/20  6:04 AM  Result Value Ref Range   Cholesterol 101 0 - 200 mg/dL   Triglycerides 102 <150 mg/dL   HDL 36 (L) >40 mg/dL   Total CHOL/HDL Ratio 2.8 RATIO   VLDL 20 0 - 40 mg/dL   LDL Cholesterol 45 0 - 99 mg/dL   CT HEAD WO CONTRAST  Result Date: 08/05/2020 CLINICAL DATA:  Transient ischemic attack, episode of LEFT-sided weakness and dizziness today lasting 1 hour, symptoms resolved EXAM: CT HEAD WITHOUT CONTRAST TECHNIQUE: Contiguous axial images were obtained  from the base of the skull through the vertex without intravenous contrast. Sagittal and coronal MPR images  reconstructed from axial data set. COMPARISON:  09/23/2013 FINDINGS: Brain: Generalized atrophy. Normal ventricular morphology. No midline shift or mass effect. Small vessel chronic ischemic changes of deep cerebral white matter. No intracranial hemorrhage, mass lesion, evidence of acute infarction, or extra-axial fluid collection. Vascular: Atherosclerotic calcification of internal carotid and vertebral arteries at skull base Skull: Intact Sinuses/Orbits: Scattered mucosal thickening in ethmoid air cells and sphenoid sinus. Minimal fluid RIGHT maxillary sinus. Other: N/A IMPRESSION: Atrophy with small vessel chronic ischemic changes of deep cerebral white matter. No acute intracranial abnormalities. Electronically Signed   By: Lavonia Dana M.D.   On: 08/05/2020 11:19   MR ANGIO HEAD WO CONTRAST  Result Date: 08/05/2020 CLINICAL DATA:  Transient ischemic attack. EXAM: MRA HEAD WITHOUT CONTRAST TECHNIQUE: Angiographic images of the Circle of Willis were obtained using MRA technique without intravenous contrast. COMPARISON:  None. FINDINGS: The visualized portions of the distal cervical and intracranial internal carotid arteries are widely patent with normal flow related enhancement. The bilateral anterior cerebral arteries and middle cerebral arteries are widely patent with antegrade flow without high-grade flow-limiting stenosis or proximal branch occlusion. No intracranial aneurysm within the anterior circulation. The vertebral arteries are widely patent with antegrade flow. Vertebrobasilar junction and basilar artery are widely patent with antegrade flow without evidence of basilar stenosis or aneurysm. Posterior cerebral arteries are normal bilaterally. No intracranial aneurysm within the posterior circulation. IMPRESSION: Normal intracranial MRA. Electronically Signed   By: Pedro Earls M.D.   On: 08/05/2020 16:45   MR Angiogram Neck W or Wo Contrast  Result Date: 08/05/2020 CLINICAL DATA:   Stroke EXAM: MRA NECK WITHOUT AND WITH CONTRAST TECHNIQUE: Multiplanar and multiecho pulse sequences of the neck were obtained without and with intravenous contrast. Angiographic images of the neck were obtained using MRA technique without and with intravenous contrast. CONTRAST:  9.52mL GADAVIST GADOBUTROL 1 MMOL/ML IV SOLN COMPARISON:  MRI head 08/05/2020 FINDINGS: Image quality degraded by extensive motion. In addition, there is suboptimal arterial enhancement Decreased signal at the carotid bifurcation bilaterally consistent with stenosis which is difficult to quantify. Possible severe stenosis at the left carotid bifurcation. Both vertebral arteries appear patent with antegrade flow. IMPRESSION: Examination limited by motion and suboptimal arterial opacification Probable bilateral carotid bifurcation stenosis left greater than right. Correlate with CTA or Doppler. Electronically Signed   By: Franchot Gallo M.D.   On: 08/05/2020 17:02   MR BRAIN WO CONTRAST  Result Date: 08/05/2020 CLINICAL DATA:  Transient ischemic attack. EXAM: MRI HEAD WITHOUT CONTRAST TECHNIQUE: Multiplanar, multiecho pulse sequences of the brain and surrounding structures were obtained without intravenous contrast. COMPARISON:  Head CT July 05, 2020. FINDINGS: Brain: Focus of restricted diffusion consistent with acute/subacute infarct in the periventricular white matter adjacent to the body of the right lateral ventricle. No hemorrhage, hydrocephalus, extra-axial collection or mass lesion. Scattered foci of T2 hyperintensity are seen in the white matter of the cerebral hemispheres, nonspecific. Moderate parenchymal volume loss. Vascular: Normal flow voids. Skull and upper cervical spine: Normal marrow signal. Sinuses/Orbits: Mucosal thickening throughout the paranasal sinuses with fluid level within the left sphenoid sinus. Right lens surgery. Other: Mild bilateral mastoid effusions, right greater than left. IMPRESSION: 1. Focus of  restricted diffusion consistent with acute/subacute infarct in the periventricular white matter adjacent to the body of the right lateral ventricle. 2. Scattered foci of T2 hyperintensity in the white matter of  the cerebral hemispheres, nonspecific but may represent chronic microvascular ischemic changes. 3. Moderate parenchymal volume loss. 4. Paranasal sinus disease with fluid level within the left sphenoid sinus. Correlate for acute sinusitis. 5. Mild bilateral mastoid effusions, right greater than left. These results were called by telephone at the time of interpretation on 08/05/2020 at 4:16 pm to Dr. Thomasenia Bottoms, Who verbally acknowledged these results. Electronically Signed   By: Pedro Earls M.D.   On: 08/05/2020 16:18   VAS US CAROTID (at [facility] and [facility] only)  Result Date: 08/06/2020 Carotid Arterial Duplex Study Indications:       TIA. Risk Factors:      Hypertension, coronary artery disease. Comparison Study:  no prior Performing Technologist: Abram Sander RVS  Examination Guidelines: A complete evaluation includes B-mode imaging, spectral Doppler, color Doppler, and power Doppler as needed of all accessible portions of each vessel. Bilateral testing is considered an integral part of a complete examination. Limited examinations for reoccurring indications may be performed as noted.  Right Carotid Findings: +----------+--------+--------+--------+------------------+--------+           PSV cm/sEDV cm/sStenosisPlaque DescriptionComments +----------+--------+--------+--------+------------------+--------+ CCA Prox  55      11              heterogenous               +----------+--------+--------+--------+------------------+--------+ CCA Distal37      7               heterogenous               +----------+--------+--------+--------+------------------+--------+ ICA Prox  47      14      1-39%   heterogenous                +----------+--------+--------+--------+------------------+--------+ ICA Distal90      21                                         +----------+--------+--------+--------+------------------+--------+ ECA       55      4                                          +----------+--------+--------+--------+------------------+--------+ +----------+--------+-------+--------+-------------------+           PSV cm/sEDV cmsDescribeArm Pressure (mmHG) +----------+--------+-------+--------+-------------------+ Subclavian70                                         +----------+--------+-------+--------+-------------------+ +---------+--------+--+--------+--+---------+ VertebralPSV cm/s54EDV cm/s10Antegrade +---------+--------+--+--------+--+---------+  Left Carotid Findings: +----------+--------+--------+--------+------------------+---------+           PSV cm/sEDV cm/sStenosisPlaque DescriptionComments  +----------+--------+--------+--------+------------------+---------+ CCA Prox  86      13              heterogenous                +----------+--------+--------+--------+------------------+---------+ CCA Distal43      7               heterogenous                +----------+--------+--------+--------+------------------+---------+ ICA Prox  67      24      1-39%   heterogenous  Shadowing +----------+--------+--------+--------+------------------+---------+ ICA Distal52      13                                          +----------+--------+--------+--------+------------------+---------+ ECA       64      9                                           +----------+--------+--------+--------+------------------+---------+ +----------+--------+--------+--------+-------------------+           PSV cm/sEDV cm/sDescribeArm Pressure (mmHG) +----------+--------+--------+--------+-------------------+ [id number]                                           +----------+--------+--------+--------+-------------------+ +---------+--------+--+--------+--+---------+ VertebralPSV cm/s40EDV cm/s12Antegrade +---------+--------+--+--------+--+---------+   Summary: Right Carotid: Velocities in the right ICA are consistent with a 1-39% stenosis. Left Carotid: Velocities in the left ICA are consistent with a 1-39% stenosis. Vertebrals: Bilateral vertebral arteries demonstrate antegrade flow. *See table(s) above for measurements and observations.     Preliminary      Assessment/Plan: Diagnosis: Right sided infarct in the periventricular white matter 1. Does the need for close, 24 hr/day medical supervision in concert with the patient's rehab needs make it unreasonable for this patient to be served in a less intensive setting? Yes 2. Co-Morbidities requiring supervision/potential complications:  1. Atrial fibrillation: HR will be monitored TID 2. Right ankle fracture: XRs to be repeated in 1 week (6 weeks from fracture). We can discuss results with his orthopedic surgeon and hopefully downgrade him to aircast if healing well.  3. Right knee severe OA: continue ICE BID. May benefit from  Neoprene knee sleeve for buckling. Will benefit from quadriceps strengthening.  4. Obesity (BMI 30.51): will benefit from nutrition/exercise counseling 5. HTN: Monitor BP TID 3. Due to bladder management, bowel management, safety, skin/wound care, disease management, medication administration, pain management and patient education, does the patient require 24 hr/day rehab nursing? Yes 4. Does the patient require coordinated care of a physician, rehab nurse, therapy disciplines of PT, OT to address physical and functional deficits in the context of the above medical diagnosis(es)? Yes Addressing deficits in the following areas: balance, endurance, locomotion, strength, transferring, bowel/bladder control, bathing, dressing, feeding, grooming, toileting, cognition and psychosocial  support 5. Can the patient actively participate in an intensive therapy program of at least 3 hrs of therapy per day at least 5 days per week? Yes 6. The potential for patient to make measurable gains while on inpatient rehab is excellent 7. Anticipated functional outcomes upon discharge from inpatient rehab are modified independent  with PT, modified independent with OT, independent with SLP. 8. Estimated rehab length of stay to reach the above functional goals is: 10-14 days 9. Anticipated discharge destination: Home 10. Overall Rehab/Functional Prognosis: excellent  RECOMMENDATIONS: This patient's condition is appropriate for continued rehabilitative care in the following setting: CIR Patient has agreed to participate in recommended program. Yes Note that insurance prior authorization may be required for reimbursement for recommended care.  Comment: Thank you for this consult. Admission coordinator to follow.   I have personally performed a face to face diagnostic evaluation, including, but not limited to relevant history and physical exam findings, of this  patient and developed relevant assessment and plan.  Additionally, I have reviewed and concur with the physician assistant's documentation above.  Leeroy Cha, MD  Lavon Paganini [place], PA-C 08/06/2020

## 2020-08-06 NOTE — TOC Initial Note (Addendum)
Transition of Care [facility]) - Initial/Assessment Note    Patient Details  Name: Shane Espinoza MRN: [id number] Date of Birth: [birth year]  Transition of [facility]) CM/SW Contact:    Marilu Favre, RN Phone Number: 08/06/2020, 11:29 AM  Clinical Narrative:                  Patient from home with wife. Confirmed face sheet information with patient and wife at bedside.   NCM started reviewing observation letter with patient and wife. Attending entered room and wants to change to inpatient. NCM secure chatted UR Ashley awaiting response.    Patient has changed to inpatient status. Patient and wife aware.   Patient and wife hopeful for CIR. They do not want SNF. CIR consult has been entered. Expected Discharge Plan: [place]     Patient Goals and CMS Choice Patient states their goals for this hospitalization and ongoing recovery are:: to go to inpatient rehab CMS Medicare.gov Compare Post Acute Care list provided to:: Patient Choice offered to / list presented to : Patient  Expected Discharge Plan and Services Expected Discharge Plan: [place]   Discharge Planning Services: CM Consult   Living arrangements for the past 2 months: Single Family Home                 DME Arranged: N/A         HH Arranged: NA          Prior Living Arrangements/Services Living arrangements for the past 2 months: Single Family Home Lives with:: Significant Other Patient language and need for interpreter reviewed:: Yes Do you feel safe going back to the place where you live?: Yes      Need for Family Participation in Patient Care: Yes (Comment) Care giver support system in place?: No (comment)   Criminal Activity/Legal Involvement Pertinent to Current Situation/Hospitalization: No - Comment as needed  Activities of Daily Living Home Assistive Devices/Equipment: Walker (specify type) ADL Screening (condition at time of admission) Patient's cognitive ability  adequate to safely complete daily activities?: Yes Is the patient deaf or have difficulty hearing?: Yes Does the patient have difficulty seeing, even when wearing glasses/contacts?: No Does the patient have difficulty concentrating, remembering, or making decisions?: No Patient able to express need for assistance with ADLs?: Yes Does the patient have difficulty dressing or bathing?: No Independently performs ADLs?: Yes (appropriate for developmental age) Does the patient have difficulty walking or climbing stairs?: Yes Weakness of Legs: Right Weakness of Arms/Hands: None  Permission Sought/Granted   Permission granted to share information with : Yes, Verbal Permission Granted  Share Information with NAME: wife susan           Emotional Assessment Appearance:: Appears stated age Attitude/Demeanor/Rapport: Engaged Affect (typically observed): Accepting Orientation: : Oriented to Self,Oriented to Place,Oriented to  Time,Oriented to Situation Alcohol / Substance Use: Not Applicable Psych Involvement: No (comment)  Admission diagnosis:  TIA (transient ischemic attack) [G45.9] Patient Active Problem List   Diagnosis Date Noted  . TIA (transient ischemic attack) 08/05/2020  . Gout 12/07/2013  . Atrial fibrillation ([place]) 12/07/2013  . Acute thrombotic stroke ([place]) 09/23/2013  . CVA (cerebral infarction) 09/23/2013  . HTN (hypertension) 04/16/2013  . Hyperlipidemia with target LDL less than 70 04/16/2013  . CAD (coronary artery disease)   . Anxiety   . Hypertension   . S/P CABG x 4    PCP:  Josetta Huddle, MD Pharmacy:   [facility] [phone] -  [place], [place] [address] [place] [address] [place] [place] [address] Phone: [phone] Fax: [phone]  [facility]) [place], [address] [place] [address] Phone: [phone] Fax: [phone]  [place], [place] [place] [address] [place] [address] Phone: [phone] Fax: [phone]  [place], [place] [address], [address] [place], [address] [place] [address] Phone: [phone] Fax: [phone]  [facility] [place], [place] to Registered [employer] [facility] [place] [address] Phone: [phone] Fax: [phone]     Social Determinants of Health (SDOH) Interventions    Readmission Risk Interventions No flowsheet data found.

## 2020-08-06 NOTE — Progress Notes (Signed)
Pt taken to Vascular dept

## 2020-08-06 NOTE — Progress Notes (Signed)
STROKE TEAM PROGRESS NOTE   INTERVAL HISTORY No acute intervals  Patient lying in bed in NAD with wife and priest at bedside.   He denies new symptoms of concern. He reports he is taking Xarelto taking daily  at 5pm or 9pm usually with food. Counseled regarding taking consistently at the same time of the day.  We discussed his stroke diagnosis, ongoing work up and plan of care. Questions were answered.  Vitals:   08/06/20 0022 08/06/20 0215 08/06/20 0420 08/06/20 1209  BP: 118/71 128/79 113/79 118/67  Pulse: 70 76 73 66  Resp: 18 18 18 16   Temp: 98 F (36.7 C) 98.4 F (36.9 C) 98.1 F (36.7 C) 97.8 F (36.6 C)  TempSrc: Oral Oral Oral Oral  SpO2: 96% 96% 98% 97%  Weight:   93.7 kg   Height:       CBC:  Recent Labs  Lab 08/05/20 1033 08/05/20 1058  WBC 8.7  --   NEUTROABS 6.0  --   HGB 15.4 17.0  HCT 48.8 50.0  MCV 93.8  --   PLT 194  --    Basic Metabolic Panel:  Recent Labs  Lab 08/05/20 1033 08/05/20 1058  NA 138 136  K 3.8 5.4*  CL 103 104  CO2 24  --   GLUCOSE 132* 130*  BUN 17 28*  CREATININE 1.16 1.00  CALCIUM 9.3  --    Lipid Panel:  Recent Labs  Lab 08/06/20 0604  CHOL 101  TRIG 102  HDL 36*  CHOLHDL 2.8  VLDL 20  LDLCALC 45   HgbA1c:  Recent Labs  Lab 08/06/20 0604  HGBA1C 6.5*   Urine Drug Screen:  Recent Labs  Lab 08/05/20 1033  LABOPIA NONE DETECTED  COCAINSCRNUR NONE DETECTED  LABBENZ NONE DETECTED  AMPHETMU NONE DETECTED  THCU NONE DETECTED  LABBARB NONE DETECTED    Alcohol Level  Recent Labs  Lab 08/05/20 1033  ETH <10    IMAGING past 24 hours MR ANGIO HEAD WO CONTRAST  Result Date: 08/05/2020 CLINICAL DATA:  Transient ischemic attack. EXAM: MRA HEAD WITHOUT CONTRAST TECHNIQUE: Angiographic images of the Circle of Willis were obtained using MRA technique without intravenous contrast. COMPARISON:  None. FINDINGS: The visualized portions of the distal cervical and intracranial internal carotid arteries are widely  patent with normal flow related enhancement. The bilateral anterior cerebral arteries and middle cerebral arteries are widely patent with antegrade flow without high-grade flow-limiting stenosis or proximal branch occlusion. No intracranial aneurysm within the anterior circulation. The vertebral arteries are widely patent with antegrade flow. Vertebrobasilar junction and basilar artery are widely patent with antegrade flow without evidence of basilar stenosis or aneurysm. Posterior cerebral arteries are normal bilaterally. No intracranial aneurysm within the posterior circulation. IMPRESSION: Normal intracranial MRA. Electronically Signed   By: Pedro Earls M.D.   On: 08/05/2020 16:45   MR Angiogram Neck W or Wo Contrast  Result Date: 08/05/2020 CLINICAL DATA:  Stroke EXAM: MRA NECK WITHOUT AND WITH CONTRAST TECHNIQUE: Multiplanar and multiecho pulse sequences of the neck were obtained without and with intravenous contrast. Angiographic images of the neck were obtained using MRA technique without and with intravenous contrast. CONTRAST:  9.28mL GADAVIST GADOBUTROL 1 MMOL/ML IV SOLN COMPARISON:  MRI head 08/05/2020 FINDINGS: Image quality degraded by extensive motion. In addition, there is suboptimal arterial enhancement Decreased signal at the carotid bifurcation bilaterally consistent with stenosis which is difficult to quantify. Possible severe stenosis at the left carotid bifurcation. Both  vertebral arteries appear patent with antegrade flow. IMPRESSION: Examination limited by motion and suboptimal arterial opacification Probable bilateral carotid bifurcation stenosis left greater than right. Correlate with CTA or Doppler. Electronically Signed   By: Marlan Palau M.D.   On: 08/05/2020 17:02   MR BRAIN WO CONTRAST  Result Date: 08/05/2020 CLINICAL DATA:  Transient ischemic attack. EXAM: MRI HEAD WITHOUT CONTRAST TECHNIQUE: Multiplanar, multiecho pulse sequences of the brain and surrounding  structures were obtained without intravenous contrast. COMPARISON:  Head CT July 05, 2020. FINDINGS: Brain: Focus of restricted diffusion consistent with acute/subacute infarct in the periventricular white matter adjacent to the body of the right lateral ventricle. No hemorrhage, hydrocephalus, extra-axial collection or mass lesion. Scattered foci of T2 hyperintensity are seen in the white matter of the cerebral hemispheres, nonspecific. Moderate parenchymal volume loss. Vascular: Normal flow voids. Skull and upper cervical spine: Normal marrow signal. Sinuses/Orbits: Mucosal thickening throughout the paranasal sinuses with fluid level within the left sphenoid sinus. Right lens surgery. Other: Mild bilateral mastoid effusions, right greater than left. IMPRESSION: 1. Focus of restricted diffusion consistent with acute/subacute infarct in the periventricular white matter adjacent to the body of the right lateral ventricle. 2. Scattered foci of T2 hyperintensity in the white matter of the cerebral hemispheres, nonspecific but may represent chronic microvascular ischemic changes. 3. Moderate parenchymal volume loss. 4. Paranasal sinus disease with fluid level within the left sphenoid sinus. Correlate for acute sinusitis. 5. Mild bilateral mastoid effusions, right greater than left. These results were called by telephone at the time of interpretation on 08/05/2020 at 4:16 pm to Dr. Basil Dess, Who verbally acknowledged these results. Electronically Signed   By: Baldemar Lenis M.D.   On: 08/05/2020 16:18   ECHOCARDIOGRAM COMPLETE  Result Date: 08/06/2020    ECHOCARDIOGRAM REPORT   Patient Name:   Shane Espinoza Date of Exam: 08/06/2020 Medical Rec #:  [id number]             Height:       69.0 in Accession #:    [id number]            Weight:       206.6 lb Date of Birth:  Aug 27, 1940             BSA:          2.095 m Patient Age:    80 years              BP:           113/79 mmHg Patient Gender: M                      HR:           76 bpm. Exam Location:  Inpatient Procedure: 2D Echo, Cardiac Doppler and Color Doppler Indications:    TIA 435.9 / G45.9  History:        Patient has prior history of Echocardiogram examinations, most                 recent 04/27/2020. CAD, Prior CABG, Arrythmias:Atrial                 Fibrillation; Risk Factors:Hypertension and Non-Smoker.  Sonographer:    Renella Cunas RDCS Referring Phys: [id number] JENNIFER YATES IMPRESSIONS  1. Left ventricular ejection fraction, by estimation, is 55 to 60%. The left ventricle has normal function. The left ventricle has no regional wall motion abnormalities. Left ventricular diastolic parameters are  consistent with Grade I diastolic dysfunction (impaired relaxation).  2. Right ventricular systolic function is normal. The right ventricular size is normal. There is normal pulmonary artery systolic pressure.  3. Left atrial size was mildly dilated.  4. Right atrial size was mildly dilated.  5. The mitral valve is normal in structure. Mild mitral valve regurgitation. No evidence of mitral stenosis.  6. The aortic valve is normal in structure. Aortic valve regurgitation is not visualized. Mild to moderate aortic valve sclerosis/calcification is present, without any evidence of aortic stenosis.  7. The inferior vena cava is normal in size with greater than 50% respiratory variability, suggesting right atrial pressure of 3 mmHg. Conclusion(s)/Recommendation(s): No intracardiac source of embolism detected on this transthoracic study. A transesophageal echocardiogram is recommended to exclude cardiac source of embolism if clinically indicated. FINDINGS  Left Ventricle: Left ventricular ejection fraction, by estimation, is 55 to 60%. The left ventricle has normal function. The left ventricle has no regional wall motion abnormalities. The left ventricular internal cavity size was normal in size. There is  no left ventricular hypertrophy. Left ventricular diastolic  parameters are consistent with Grade I diastolic dysfunction (impaired relaxation). Right Ventricle: The right ventricular size is normal. No increase in right ventricular wall thickness. Right ventricular systolic function is normal. There is normal pulmonary artery systolic pressure. The tricuspid regurgitant velocity is 2.09 m/s, and  with an assumed right atrial pressure of 3 mmHg, the estimated right ventricular systolic pressure is 85.2 mmHg. Left Atrium: Left atrial size was mildly dilated. Right Atrium: Right atrial size was mildly dilated. Pericardium: There is no evidence of pericardial effusion. Mitral Valve: The mitral valve is normal in structure. There is mild thickening of the mitral valve leaflet(s). Mild mitral valve regurgitation. No evidence of mitral valve stenosis. Tricuspid Valve: The tricuspid valve is normal in structure. Tricuspid valve regurgitation is mild . No evidence of tricuspid stenosis. Aortic Valve: The aortic valve is normal in structure. Aortic valve regurgitation is not visualized. Mild to moderate aortic valve sclerosis/calcification is present, without any evidence of aortic stenosis. Pulmonic Valve: The pulmonic valve was normal in structure. Pulmonic valve regurgitation is trivial. No evidence of pulmonic stenosis. Aorta: The aortic root is normal in size and structure. Venous: The inferior vena cava is normal in size with greater than 50% respiratory variability, suggesting right atrial pressure of 3 mmHg. IAS/Shunts: No atrial level shunt detected by color flow Doppler.  LEFT VENTRICLE PLAX 2D LVIDd:         5.80 cm LVIDs:         3.70 cm LV PW:         0.90 cm LV IVS:        0.90 cm LVOT diam:     2.30 cm LV SV:         59 LV SV Index:   28 LVOT Area:     4.15 cm  LV Volumes (MOD) LV vol d, MOD A2C: 115.0 ml LV vol d, MOD A4C: 93.7 ml LV vol s, MOD A2C: 59.8 ml LV vol s, MOD A4C: 51.0 ml LV SV MOD A2C:     55.2 ml LV SV MOD A4C:     93.7 ml LV SV MOD BP:      49.2 ml  RIGHT VENTRICLE TAPSE (M-mode): 1.0 cm LEFT ATRIUM             Index       RIGHT ATRIUM  Index LA diam:        5.80 cm 2.77 cm/m  RA Area:     25.00 cm LA Vol (A2C):   73.3 ml 35.00 ml/m RA Volume:   72.40 ml  34.57 ml/m LA Vol (A4C):   80.1 ml 38.24 ml/m LA Biplane Vol: 80.9 ml 38.62 ml/m  AORTIC VALVE LVOT Vmax:   85.60 cm/s LVOT Vmean:  53.300 cm/s LVOT VTI:    0.142 m  AORTA Ao Root diam: 3.40 cm MR Peak grad: 106.5 mmHg  TRICUSPID VALVE MR Vmax:      516.00 cm/s TR Peak grad:   17.5 mmHg                           TR Vmax:        209.00 cm/s                            SHUNTS                           Systemic VTI:  0.14 m                           Systemic Diam: 2.30 cm Candee Furbish MD Electronically signed by Candee Furbish MD Signature Date/Time: 08/06/2020/1:37:52 PM    Final    VAS US CAROTID (at [facility] and [facility] only)  Result Date: 08/06/2020 Carotid Arterial Duplex Study Indications:       TIA. Risk Factors:      Hypertension, coronary artery disease. Comparison Study:  no prior Performing Technologist: Abram Sander RVS  Examination Guidelines: A complete evaluation includes B-mode imaging, spectral Doppler, color Doppler, and power Doppler as needed of all accessible portions of each vessel. Bilateral testing is considered an integral part of a complete examination. Limited examinations for reoccurring indications may be performed as noted.  Right Carotid Findings: +----------+--------+--------+--------+------------------+--------+           PSV cm/sEDV cm/sStenosisPlaque DescriptionComments +----------+--------+--------+--------+------------------+--------+ CCA Prox  55      11              heterogenous               +----------+--------+--------+--------+------------------+--------+ CCA Distal37      7               heterogenous               +----------+--------+--------+--------+------------------+--------+ ICA Prox  47      14      1-39%   heterogenous                +----------+--------+--------+--------+------------------+--------+ ICA Distal90      21                                         +----------+--------+--------+--------+------------------+--------+ ECA       55      4                                          +----------+--------+--------+--------+------------------+--------+ +----------+--------+-------+--------+-------------------+           PSV cm/sEDV cmsDescribeArm Pressure (mmHG) +----------+--------+-------+--------+-------------------+  Subclavian70                                         +----------+--------+-------+--------+-------------------+ +---------+--------+--+--------+--+---------+ VertebralPSV cm/s54EDV cm/s10Antegrade +---------+--------+--+--------+--+---------+  Left Carotid Findings: +----------+--------+--------+--------+------------------+---------+           PSV cm/sEDV cm/sStenosisPlaque DescriptionComments  +----------+--------+--------+--------+------------------+---------+ CCA Prox  86      13              heterogenous                +----------+--------+--------+--------+------------------+---------+ CCA Distal43      7               heterogenous                +----------+--------+--------+--------+------------------+---------+ ICA Prox  67      24      1-39%   heterogenous      Shadowing +----------+--------+--------+--------+------------------+---------+ ICA Distal52      13                                          +----------+--------+--------+--------+------------------+---------+ ECA       64      9                                           +----------+--------+--------+--------+------------------+---------+ +----------+--------+--------+--------+-------------------+           PSV cm/sEDV cm/sDescribeArm Pressure (mmHG) +----------+--------+--------+--------+-------------------+ [id number]                                           +----------+--------+--------+--------+-------------------+ +---------+--------+--+--------+--+---------+ VertebralPSV cm/s40EDV cm/s12Antegrade +---------+--------+--+--------+--+---------+   Summary: Right Carotid: Velocities in the right ICA are consistent with a 1-39% stenosis. Left Carotid: Velocities in the left ICA are consistent with a 1-39% stenosis. Vertebrals: Bilateral vertebral arteries demonstrate antegrade flow. *See table(s) above for measurements and observations.  Electronically signed by Antony Contras MD on 08/06/2020 at 12:16:27 PM.    Final    PHYSICAL EXAM Pleasant elderly Caucasian male not in distress.  He is right foot is in the immobilizer for recent ankle fracture. . Afebrile. Head is nontraumatic. Neck is supple without bruit.    Cardiac exam no murmur or gallop. Lungs are clear to auscultation. Distal pulses are well felt. Neurological Exam :  Awake alert oriented to time place and person.  No dysarthria aphasia or apraxia.  Extraocular movements are full range no nystagmus.  Blinks to threat bilaterally.  Mild decreased hearing bilaterally.  No facial weakness.  Tongue midline motor system exam no upper or lower extremity drift but subtle weakness of the left grip and intrinsic hand muscles and orbits right over left upper extremity.  Trace weakness of left hip flexors and ankle dorsiflexors.  Coordination slightly impaired on the left compared to the right.  Sensation is preserved bilaterally.  Reflexes are symmetric.  Plantars downgoing.  Gait not tested. ASSESSMENT/PLAN   Shane Espinoza is a 80 y.o. male right hand PMHx CAD s/p CABG x 4;CVA (2015);HTN; and afib(rivaroxaban) presenting with acute L-sided weakness found to have acute ischemic stroke in  the periventricular white matter adjacent to the body of the right lateral ventricle/body of the caudate.  Stroke:   Acute/subacute infarct in the periventricular white matter adjacent to the body of the right  lateral ventricle likely from small vessel disease though he also has   atrial fibrillation   Code Stroke CT: No acute findings   VOH:YWVPX of restricted diffusion consistent with acute/subacute infarct in the periventricular white matter adjacent to the body of the right lateral ventricle. Scattered foci of T2 hyperintensity in the white matter of the cerebral hemispheres, nonspecific but may represent chronic microvascular ischemic changes. Moderate parenchymal volume loss.   MRA: Probable bilateral carotid bifurcation stenosis left greater than right. Correlate with CTA or Doppler.  Carotid Doppler: Velocities in the right ICA are consistent with a 1-39% stenosis.Left Carotid: Velocities in the left ICA consistent with a 1-39% stenosis.Vertebrals: Bilateral vertebral arteries demonstrate antegrade flow.  2D Echo: EF 55-60%, bilateral atria dilated.   LDL 45  HgbA1c 6.5  VTE prophylaxis - patient on Xarelto  Prior to admission on Xarelto 20mg  daily.   Therapy recommendations:  CIR  Disposition: CIR  Home Xarelto regimen resumed.   Hypertension  Stable at 113-133/67-79 . Permissive hypertension (OK if < 220/120) but gradually normalize in 5-7 days . Long-term BP goal normotensive  Hyperlipidemia  Home meds: simvastatin 20mg    LDL 45 at goal < 70, currently on lipitor 40 mg   Continue statin at discharge  Other Stroke Risk Factors  Advanced Age >/= 21   Obesity, Body mass index is 30.51 kg/m., BMI >/= 30 associated with increased stroke risk, recommend weight loss, diet and exercise as appropriate   Hx stroke/TIA  Family hx stroke (Father)  Coronary artery disease  Congestive heart failure, grade I diastolic dysfunction  Other Active Problems    Hospital day # 0  I have personally obtained history,examined this patient, reviewed notes, independently viewed imaging studies, participated in medical decision making and plan of care.ROS completed by me  personally and pertinent positives fully documented  I have made any additions or clarifications directly to the above note. Agree with note above.  Patient has history of atrial fibrillation and was on Xarelto but was not taking it consistently at the same time with a heavy meal.  He presents with right thalamic infarct likely due to small vessel disease and has very mild left-sided symptoms which appear to be improving.  Patient counseled to be compliant with taking Xarelto at regularly the same time every day and preferably with the largest meal of the day.  Check echocardiogram and carotid Dopplers.  Aggressive risk factor modification.  Mobilize out of bed and therapy consults.  Greater than 50% time during this 35-minute visit was spent on counseling and coordination of care about his lacunar stroke and atrial fibrillation and discussion about management and answered questions.  Discussed with Dr. Dana Allan.  Antony Contras, MD Medical Director [place] Pager: [phone] 08/06/2020 2:54 PM   To contact Stroke Continuity provider, please refer to [email or web address] After hours, contact General Neurology

## 2020-08-06 NOTE — Progress Notes (Signed)
SLP Cancellation Note  Patient Details Name: Shane Espinoza MRN: [id number] DOB: 08-Dec-1940   Cancelled treatment:       Reason Eval/Treat Not Completed: Patient at procedure or test/unavailable (Pt discussing plan of care with case management. SLP will follow up later today as schedule allows.)  Leanard Dimaio I. Hardin Negus, [place], [place] Office number [phone] Pager [phone]  Horton Marshall 08/06/2020, 2:54 PM

## 2020-08-06 NOTE — Progress Notes (Signed)
  Echocardiogram 2D Echocardiogram has been performed.  Shane Espinoza 08/06/2020, 9:39 AM

## 2020-08-06 NOTE — Progress Notes (Signed)
Occupational Therapy Treatment Note  Pt seen again to help transfer pt safely back to chair and educate tech on how to safely mobilize pt. Pt has difficulty stepping with LLE; R knee pain. Continue to recommend rehab at [facility]    08/06/20 0913  OT Visit Information  Last OT Received On 08/06/20  Assistance Needed +2  History of Present Illness 80 y.o. male right hand PMHx CAD s/p CABG x 4; CVA (2015); HTN; and afib (rivaroxaban) presenting with acute L-sided weakness. The patient was walking with his walker and then noticed acute left arm weakness, shaking and some leg weakness.MRI brain showed focus of restricted diffusion consistent with acute/subacute infarct in the periventricular white matter adjacent to the body of the right lateral ventricle  Precautions  Precautions Fall  Precaution Comments R [place] when OOB  Required Braces or Orthoses Other Brace (Cam boot)  Pain Assessment  Pain Assessment Faces  Faces Pain Scale 4  Pain Location R knee and ankle; usually takes Tylenol  Pain Descriptors / Indicators Aching;Discomfort;Grimacing  Pain Intervention(s) Limited activity within patient's tolerance  Cognition  Arousal/Alertness Awake/alert  Behavior During Therapy WFL for tasks assessed/performed  Overall Cognitive Status Impaired/Different from baseline  Area of Impairment Attention;Memory;Safety/judgement;Awareness;Problem solving  Current Attention Level Selective  Memory Decreased short-term memory  Safety/Judgement Decreased awareness of safety;Decreased awareness of deficits  Awareness Emergent  Problem Solving Slow processing  ADL  Overall ADL's  Needs assistance/impaired  Bed Mobility  Overal bed mobility Needs Assistance  Bed Mobility Sit to Supine  Sit to supine Mod assist  General bed mobility comments A to lift BLE onto bed  Balance  Overall balance assessment Needs assistance;History of Falls  Sitting balance-Leahy Scale Poor  Standing balance-Leahy Scale  Zero  Restrictions  RLE Weight Bearing WBAT  Other Position/Activity Restrictions with [place]  Transfers  Overall transfer level Needs assistance  Equipment used 2 person hand held assist  Transfers Sit to/from Stand;Stand Pivot Transfers  Sit to Stand Max assist  Stand pivot transfers Max assist;+2 physical assistance  General transfer comment difficulty advancing/stepping LLE; R knee pain/difficulty wtih terminal extension  OT - End of Session  Equipment Utilized During Treatment Gait belt  Activity Tolerance Patient tolerated treatment well  Patient left in bed;with call bell/phone within reach;with nursing/sitter in room;with family/visitor present  Nurse Communication Mobility status;Need for lift equipment Charlaine Dalton)  OT Assessment/Plan  OT Plan Discharge plan remains appropriate  OT Visit Diagnosis Unsteadiness on feet (R26.81);Other abnormalities of gait and mobility (R26.89);Muscle weakness (generalized) (M62.81);History of falling (Z91.81);Ataxia, unspecified (R27.0);Other symptoms and signs involving cognitive function;Pain;Hemiplegia and hemiparesis  Hemiplegia - Right/Left Left  Hemiplegia - dominant/non-dominant Non-Dominant  Hemiplegia - caused by Cerebral infarction  Pain - Right/Left Right  Pain - part of body Knee  OT Frequency (ACUTE ONLY) Min 2X/week  Recommendations for Other Services Rehab consult;PT consult;Speech consult  Follow Up Recommendations CIR;Supervision/Assistance - 24 hour  [facility] bed  AM-PAC OT "6 Clicks" Daily Activity Outcome Measure (Version 2)  Help from another person eating meals? 3  Help from another person taking care of personal grooming? 3  Help from another person toileting, which includes using toliet, bedpan, or urinal? 1  Help from another person bathing (including washing, rinsing, drying)? 2  Help from another person to put on and taking off regular upper body clothing? 2  Help from another person to put on and taking  off regular lower body clothing? 2  6 Click Score 13  OT Goal  Progression  Progress towards OT goals Progressing toward goals  Acute Rehab OT Goals  Patient Stated Goal to get better  OT Goal Formulation With patient/family  Time For Goal Achievement 08/20/20  Potential to Achieve Goals Good  ADL Goals  Pt Will Perform Grooming with set-up;sitting  Pt Will Perform Upper Body Bathing with set-up;with supervision;sitting  Pt Will Perform Lower Body Bathing with min assist;sitting/lateral leans;sit to/from stand  Pt Will Perform Upper Body Dressing with supervision;with set-up;sitting  Pt Will Transfer to Toilet with min assist;with +2 assist;bedside commode;stand pivot transfer  Additional ADL Goal #1 Pt will maintain midline postural control EOB independently inpreparation for ADL tasks  OT Time Calculation  OT Start Time (ACUTE ONLY) 0840  OT Stop Time (ACUTE ONLY) 0858  OT Time Calculation (min) 18 min  OT General Charges  $OT Visit 1 Visit  OT Treatments  $Self Care/Home Management  8-22 mins  Maurie Boettcher, OT/L   Acute OT Clinical Specialist [place] Pager [phone] Office [phone]

## 2020-08-06 NOTE — Discharge Instructions (Signed)

## 2020-08-06 NOTE — Progress Notes (Signed)
Carotid duplex has been completed.   Preliminary results in CV Proc.   Abram Sander 08/06/2020 9:25 AM

## 2020-08-06 NOTE — Progress Notes (Signed)
Occupational Therapy Evaluation Patient Details Name: Shane Espinoza MRN: [id number] DOB: 1941/04/27 Today's Date: 08/06/2020    History of Present Illness 80 y.o. male right hand PMHx CAD s/p CABG x 4; CVA (2015); HTN; and afib (rivaroxaban) presenting with acute L-sided weakness. The patient was walking with his walker and then noticed acute left arm weakness, shaking and some leg weakness.MRI brain showed focus of restricted diffusion consistent with acute/subacute infarct in the periventricular white matter adjacent to the body of the right lateral ventricle   Clinical Impression   PTA pt lives at home with his wife, who has been assisting with ADL tasks and supervising mobility @ RW level since his fall 4 weeks ago with resulting R ankle fx. Pt is WBAT in [place]. Pt presents with L hemiparesis and ataxia in addition to deficits listed below and currently requires +2 Max A for functional transfers and overall Max A with LB ADL. Recommend rehab at CIR to maximize functional level of independence. Will follow acutely. Pt may benefit from pain patch R knee to help with mobility - MD messaged.      Follow Up Recommendations  CIR;Supervision/Assistance - 24 hour    Equipment Recommendations  Hospital bed    Recommendations for Other Services Rehab consult;PT consult;Speech consult     Precautions / Restrictions Precautions Precautions: Fall Restrictions Weight Bearing Restrictions: Yes RLE Weight Bearing: Weight bearing as tolerated (with [place])      Mobility Bed Mobility Overal bed mobility: Needs Assistance Bed Mobility: Supine to Sit     Supine to sit: Mod assist;HOB elevated          Transfers Overall transfer level: Needs assistance Equipment used: Rolling walker (2 wheeled) Transfers: Sit to/from [employer] Sit to Stand: Max assist   Squat pivot transfers: +2 safety/equipment;Mod assist          Balance Overall balance assessment:  Needs assistance;History of Falls   Sitting balance-Leahy Scale: Poor       Standing balance-Leahy Scale: Zero                             ADL either performed or assessed with clinical judgement   ADL Overall ADL's : Needs assistance/impaired Eating/Feeding: Set up   Grooming: Supervision/safety;Set up;Sitting   Upper Body Bathing: Minimal assistance;Sitting   Lower Body Bathing: Maximal assistance;Sit to/from stand   Upper Body Dressing : Moderate assistance;Sitting   Lower Body Dressing: Maximal assistance;Sit to/from stand   Toilet Transfer: Maximal assistance;+2 for safety/equipment   Toileting- Clothing Manipulation and Hygiene: Total assistance Toileting - Clothing Manipulation Details (indicate cue type and reason): incontinenet of urine/BM; states he is unsure when he has to go     Functional mobility during ADLs: Maximal assistance;+2 for safety/equipment;Rolling walker;Cueing for safety;Cueing for sequencing General ADL Comments: RW used to stand. Unable to safely trnasfer with RW due to L knee buckling and R knee weakness     Vision Baseline Vision/History: Wears glasses Wears Glasses: Reading only Additional Comments: will futher assess     Perception Perception Comments: decreased awareness of midline   Praxis Praxis Praxis tested?: Within functional limits    Pertinent Vitals/Pain Pain Assessment: 0-10 Pain Score: 3  Pain Location: R knee and ankle; usually takes Tylenol Pain Descriptors / Indicators: Aching;Discomfort;Grimacing Pain Intervention(s): Limited activity within patient's tolerance     Hand Dominance Right   Extremity/Trunk Assessment Upper Extremity Assessment Upper Extremity Assessment: LUE deficits/detail LUE  Deficits / Details: AROM WFL; generalized weakness; apparent sensory motor deficits/ataxic LUE Coordination: decreased fine motor;decreased gross motor   Lower Extremity Assessment Lower Extremity Assessment:  Defer to PT evaluation   Cervical / Trunk Assessment Cervical / Trunk Assessment: Other exceptions Cervical / Trunk Exceptions: posterior and L bias   Communication Communication Communication: HOH   Cognition Arousal/Alertness: Awake/alert Behavior During Therapy: WFL for tasks assessed/performed Overall Cognitive Status: Impaired/Different from baseline Area of Impairment: Attention;Memory;Safety/judgement;Awareness;Problem solving                   Current Attention Level: Selective Memory: Decreased short-term memory   Safety/Judgement: Decreased awareness of safety;Decreased awareness of deficits Awareness: Emergent Problem Solving: Slow processing General Comments: Wife states this is baseline however pt appears impaired. 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[place] expects to be discharged to:: Private residence Living Arrangements: Spouse/significant other Available Help at Discharge: Available 24 hours/day Type of Home: House Home Access: Stairs to enter [employer] of Steps: 3 Entrance Stairs-Rails: Right;Left Home Layout: Two level Alternate Level Stairs-Number of Steps: 13 steps (Has Stair lift)   Bathroom Shower/Tub: Occupational psychologist: Handicapped height Bathroom Accessibility: Yes How Accessible: Accessible via walker Home Equipment: [employer] - 2 wheels;Wheelchair - manual;Shower seat - built in;Grab bars - tub/shower;Grab bars - toilet;Bedside commode;Cane - single point;Walker - 4 wheels          Prior Functioning/Environment Level of Independence: Independent with assistive device(s);Needs assistance (wife "watches")  Gait / Transfers Assistance Needed: recent fall; states because of bad knee ADL's /  Homemaking Assistance Needed: up until fracture he was completing his ADLs independently; wife has been assisting Communication / Swallowing Assistance Needed: speech different from baseline          OT Problem List: Decreased strength;Decreased range of motion;Decreased activity tolerance;Impaired balance (sitting and/or standing);Impaired vision/perception;Decreased coordination;Decreased cognition;Decreased safety awareness;Decreased knowledge of use of DME or AE;Decreased knowledge of precautions;Impaired UE functional use;Obesity;Pain      OT Treatment/Interventions: Self-care/ADL training;Therapeutic exercise;Neuromuscular education;DME and/or AE instruction;Therapeutic activities;Cognitive remediation/compensation;Visual/perceptual remediation/compensation;Patient/family education;Balance training    OT Goals(Current goals can be found in the care plan section) Acute Rehab OT Goals Patient Stated Goal: to get better OT Goal Formulation: With patient/family Time For Goal Achievement: 08/20/20 Potential to Achieve Goals: Good  OT Frequency: Min 2X/week   Barriers to D/C:            Co-evaluation              AM-PAC OT "6 Clicks" Daily Activity     Outcome Measure Help from another person eating meals?: A Little Help from another person taking care of personal grooming?: A Little Help from another person toileting, which includes using toliet, bedpan, or urinal?: Total Help from another person bathing (including washing, rinsing, drying)?: A Lot Help from another person to put on and taking off regular upper body clothing?: A Lot Help from another person to put on and taking off regular lower body clothing?: A Lot 6 Click Score: 13   End of Session Equipment Utilized During Treatment: Rolling walker;Gait belt Nurse Communication: Mobility status;Need for lift equipment (staff use [place])  Activity Tolerance: Patient tolerated treatment well Patient left: in chair;with  call bell/phone within reach;with chair alarm set;with family/visitor present  OT Visit Diagnosis: Unsteadiness on feet (R26.81);Other abnormalities of gait and mobility (R26.89);Muscle weakness (generalized) (M62.81);History of falling (Z91.81);Ataxia, unspecified (R27.0);Other symptoms and signs involving cognitive function;Pain;Hemiplegia and hemiparesis Hemiplegia - Right/Left: Left Hemiplegia - dominant/non-dominant: Non-Dominant Hemiplegia - caused by: Cerebral infarction Pain - Right/Left: Right Pain - part of body: Knee                Time: [id number] OT Time Calculation (min): 27 min Charges:  OT General Charges $OT Visit: 1 Visit OT Evaluation $OT Eval Moderate Complexity: 1 Mod OT Treatments $Self Care/Home Management : 8-22 mins  Maurie Boettcher, OT/L   Acute OT Clinical Specialist Acute Rehabilitation Services Pager [phone] Office [phone]   [facility] 08/06/2020, 9:07 AM

## 2020-08-08 DIAGNOSIS — I4891 Unspecified atrial fibrillation: Secondary | ICD-10-CM | POA: Diagnosis not present

## 2020-08-08 MED ORDER — VITAMIN D 25 MCG (1000 UNIT) PO TABS
5000.0000 [IU] | ORAL_TABLET | ORAL | Status: DC
  Administered 2020-08-09 – 2020-08-11 (×2): 5000 [IU] via ORAL
  Filled 2020-08-08 (×2): qty 5

## 2020-08-08 MED ORDER — VITAMIN B-12 1000 MCG PO TABS
1000.0000 ug | ORAL_TABLET | Freq: Every day | ORAL | Status: DC
  Administered 2020-08-08 – 2020-08-11 (×4): 1000 ug via ORAL
  Filled 2020-08-08 (×4): qty 1

## 2020-08-08 NOTE — Progress Notes (Signed)
PROGRESS NOTE    GOVIND LYSAGHT  [id number] DOB: [birth year] DOA: 08/05/2020 PCP: Josetta Huddle, MD  Outpatient Specialists:   Brief Narrative:  Patient is an 80 year old Caucasian male with past medical history significant for CAD s/p CABG x 4; CVA (2015); HTN; and afib on Xarelto.  Patient presented with left-sided weakness. About 4 weeks ago, patient injured his right ankle, was noted to have had fracture, and was non-weight bearing.  Patient was seen by the orthopedic surgeon 2 days prior to presentation, and was allowed to start weight bearing.  He is concerned that he did too much.  His left side gave way and he almost colalpased with L sided weakness of arm and leg.  He had some light-headedness as well.  MRI of the brain done revealed acute infarct.  Left-sided weakness has improved.  Patient was seen by PT OT and CIR has been recommended.  Patient is awaiting insurance approval and availability of bed at the CIR.  08/08/2020: Patient seen alongside patient's wife.  No new complaints.  Left-sided weakness has continued to improve.       Imaging studies and findings:  MRI brain revealed acute/subacute infarct in the periventricular white matter adjacent to the body of the right lateral ventricle.  MRA head without contrast was normal.  MRA neck contrast revealed double bilateral carotid bifurcation stenosis left greater than right.  The result of the vascular Ultrasound of the Carotid is nonrevealing.  Assessment & Plan:   Principal Problem:   TIA (transient ischemic attack) Active Problems:   Hypertension   S/P CABG x 4   Hyperlipidemia with target LDL less than 70   Atrial fibrillation (HCC)   Acute CVA (cerebrovascular accident) [facility])  TIA/CVA -Patient presented with acute onset of L-sided weakness -As per physical therapy notes, patient still has functional limitations. -CIR has been recommended. -Neurology is directing care, including but not limited to antiplatelets  and anticoagulation.. -Telemetry monitoring -MRI brain revealed acute and subacute infarcts. -Carotid doppler result is pending. -Follow other work-up.  MRA head is nonrevealing.  MRA neck result is noted.  Echo revealed EF of 55 to 60%, mild MR and mild to moderate aortic sclerosis/calcification without aortic stenosis. -HbA1c 6.5%. -Physical therapy input is highly appreciated. 08/08/2020: Patient is awaiting CIR  HTN -Initially, permissive hypertension was utilized. -Patient's blood pressure has remained controlled. -Currently,Norvasc, Toprol XL, Diovan, Lasix and Aldactonehave been on hold.  A  HLD -LDL is 45. -Total cholesterol is 101. -Patient is currently on Lipitor 40 Mg p.o. nightly.  CAD s/p CABG -Remote CABG x 4v -No current c/o chest pain  Afib -Rate controlled with Toprol - but holding for permissive HTN -Continue Xarelto. -There are concerns and how the patient may be taking Xarelto.    DVT prophylaxis: Xarelto. Code Status: Full code Family Communication: Wife Disposition Plan: CIR   Consultants:   Neurology  Procedures:   Work-up for acute CVA/TIA same progress  Antimicrobials:   None   Subjective: -Left-sided weakness is improving.  Objective: Vitals:   08/07/20 2353 08/08/20 0433 08/08/20 0840 08/08/20 1322  BP: 140/89 117/73 139/72 106/75  Pulse: 78 85 66 75  Resp: 18 20 18 18   Temp: 97.6 F (36.4 C) 98 F (36.7 C) 98.4 F (36.9 C) 97.7 F (36.5 C)  TempSrc: Oral Oral Oral   SpO2: 96% 97% 99% 98%  Weight:      Height:        Intake/Output Summary (Last 24 hours) at 08/08/2020  1721 Last data filed at 08/08/2020 0655 Gross per 24 hour  Intake 240 ml  Output 1150 ml  Net -910 ml   Filed Weights   08/05/20 1740 08/06/20 0420  Weight: 92.5 kg 93.7 kg    Examination:  General exam: Appears calm and comfortable  Respiratory system: Clear to auscultation. Respiratory effort normal. Cardiovascular system: S1 & S2,  irregularly irregular Gastrointestinal system: Abdomen is mildly obese, soft and nontender. No organomegaly or masses felt. Normal bowel sounds heard. Central nervous system: Alert and oriented.  Left-sided weakness is improving. Extremities: No leg edema  Data Reviewed: I have personally reviewed following labs and imaging studies  CBC: Recent Labs  Lab 08/05/20 1033 08/05/20 1058  WBC 8.7  --   NEUTROABS 6.0  --   HGB 15.4 17.0  HCT 48.8 50.0  MCV 93.8  --   PLT 194  --    Basic Metabolic Panel: Recent Labs  Lab 08/05/20 1033 08/05/20 1058  NA 138 136  K 3.8 5.4*  CL 103 104  CO2 24  --   GLUCOSE 132* 130*  BUN 17 28*  CREATININE 1.16 1.00  CALCIUM 9.3  --    GFR: Estimated Creatinine Clearance: 66.6 mL/min (by C-G formula based on SCr of 1 mg/dL). Liver Function Tests: Recent Labs  Lab 08/05/20 1033  AST 29  ALT 22  ALKPHOS 74  BILITOT 1.3*  PROT 7.1  ALBUMIN 3.3*   No results for input(s): LIPASE, AMYLASE in the last 168 hours. No results for input(s): AMMONIA in the last 168 hours. Coagulation Profile: Recent Labs  Lab 08/05/20 1033  INR 2.2*   Cardiac Enzymes: No results for input(s): CKTOTAL, CKMB, CKMBINDEX, TROPONINI in the last 168 hours. BNP (last 3 results) No results for input(s): PROBNP in the last 8760 hours. HbA1C: Recent Labs    08/06/20 0604  HGBA1C 6.5*   CBG: No results for input(s): GLUCAP in the last 168 hours. Lipid Profile: Recent Labs    08/06/20 0604  CHOL 101  HDL 36*  LDLCALC 45  TRIG 102  CHOLHDL 2.8   Thyroid Function Tests: Recent Labs    08/05/20 1845  TSH 2.493   Anemia Panel: No results for input(s): VITAMINB12, FOLATE, FERRITIN, TIBC, IRON, RETICCTPCT in the last 72 hours. Urine analysis:    Component Value Date/Time   COLORURINE YELLOW 08/05/2020 1033   APPEARANCEUR CLEAR 08/05/2020 1033   LABSPEC 1.006 08/05/2020 1033   PHURINE 6.0 08/05/2020 1033   GLUCOSEU NEGATIVE 08/05/2020 1033    HGBUR NEGATIVE 08/05/2020 1033   BILIRUBINUR NEGATIVE 08/05/2020 1033   KETONESUR NEGATIVE 08/05/2020 1033   PROTEINUR NEGATIVE 08/05/2020 1033   UROBILINOGEN 1.0 03/09/2011 0849   NITRITE NEGATIVE 08/05/2020 1033   LEUKOCYTESUR NEGATIVE 08/05/2020 1033   Sepsis Labs: @LABRCNTIP (procalcitonin:4,lacticidven:4)  ) Recent Results (from the past 240 hour(s))  SARS CORONAVIRUS 2 (TAT 6-24 HRS) Nasopharyngeal Nasopharyngeal Swab     Status: None   Collection Time: 08/05/20  1:15 PM   Specimen: Nasopharyngeal Swab  Result Value Ref Range Status   SARS Coronavirus 2 NEGATIVE NEGATIVE Final    Comment: (NOTE) SARS-CoV-2 target nucleic acids are NOT DETECTED.  The SARS-CoV-2 RNA is generally detectable in upper and lower respiratory specimens during the acute phase of infection. Negative results do not preclude SARS-CoV-2 infection, do not rule out co-infections with other pathogens, and should not be used as the sole basis for treatment or other patient management decisions. Negative results must be combined with  clinical observations, patient history, and epidemiological information. The expected result is Negative.  Fact Sheet for Patients: SugarRoll.be  Fact Sheet for Healthcare Providers: https://www.woods-mathews.com/  This test is not yet approved or cleared by the [place] FDA and  has been authorized for detection and/or diagnosis of SARS-CoV-2 by FDA under an Emergency Use Authorization (EUA). This EUA will remain  in effect (meaning this test can be used) for the duration of the COVID-19 declaration under Se ction 564(b)(1) of the Act, 21 U.S.C. section 360bbb-3(b)(1), unless the authorization is terminated or revoked sooner.  Performed at [facility], [place] [address] [place], [place] [address]          Radiology Studies: No results found.      Scheduled Meds: .  stroke: mapping our early stages of  recovery book   Does not apply Once  . allopurinol  100 mg Oral Daily  . aspirin  300 mg Rectal Daily   Or  . aspirin  325 mg Oral Daily  . atorvastatin  40 mg Oral QHS  . [START ON 08/09/2020] cholecalciferol  5,000 Units Oral Once per day on Mon Wed Fri  . multivitamin  1 tablet Oral Daily  . rivaroxaban  20 mg Oral Q supper  . vitamin B-12  1,000 mcg Oral Daily   Continuous Infusions:    LOS: 2 days    Time spent: 25 minutes   Dana Allan, MD  [facility] Pager #: [phone] 7PM-7AM contact night coverage as above

## 2020-08-08 NOTE — PMR Pre-admission (Signed)
PMR Admission Coordinator Pre-Admission Assessment  Patient: Shane Espinoza is an 80 y.o., male MRN: [id number] DOB: [birth year] Height: _0  (175.3 cm) Weight: 93.7 kg              Insurance Information HMO:     PPO: yes     PCP:      IPA:      80/20:      OTHER:  PRIMARY: [employer] Medicare       Policy#: [id number]      Subscriber: Pt.  CM Name: Beckie Busing      Phone#:  [phone]    Fax#: [phone]  I received authorization from [place] at [facility] on 2/8 for admission 2/7. Clinical updates are due 2/14. Pre-Cert#: [id number]      Employer:  Benefits:  Phone #: [phone]     Name:  Irene Shipper Date: 07/03/2020 - still active Deductible: does not have OOP Max: $4,500 ($221.96 met) CIR: $295/day co-pay with a max co-pay of $1,770/admission (6 days) SNF: 100% coverage Outpatient:  $35/visit co-pay; Home Health:  100% coverage DME: 80% coverage; 20% co-Insurance  SECONDARY: None      Policy#:       Phone#:   Development worker, community:       Phone#:   The Therapist, art Information Summary" for patients in Inpatient Rehabilitation Facilities with attached "Privacy Act Blessing Records" was provided and verbally reviewed with: Patient  Emergency Contact Information Contact Information    Name Relation Home Work Reynolds Spouse [phone]  [phone]   [place], Sigmon [phone]     Smith,Celeste Daughter [phone]       Current Medical History  Patient Admitting Diagnosis: CVA History of Present Illness: Shane Espinoza is a 80 y.o. right-handed male with history of anxiety, atrial fibrillation, CVA 2015, CAD with CABG 2012 maintained on Xarelto as well as recent right ankle nondisplaced fracture of the right lateral malleolus with additional findings as suggestive of a small avulsion fracture adjacent to the right medial malleolus maintained in a cam boot and weightbearing as tolerated.  Presented to 09/19/2020 with acute onset  of left-sided weakness and lightheadedness.  CT/MRI showed focus of restricted diffusion consistent with acute subacute infarct in the periventricular white matter adjacent to the body of the right lateral ventricle.  MRA unremarkable.  Patient did not receive TPA.  Echocardiogram with ejection fraction of 55 to 60% no wall motion abnormalities grade 1 diastolic dysfunction..  Carotid Dopplers with no ICA stenosis.  Admission chemistries unremarkable except glucose 132, urinalysis positive nitrite.  Neurology consulted currently maintained on aspirin as well as Xarelto for CVA prophylaxis.  Tolerating a regular diet. Physical Medicine & Rehabilitation was consulted to assess candidacy for CIR given impaired mobility and ADLs.    Complete NIHSS TOTAL: 0 Glasgow Coma Scale Score: 15  Past Medical History  Past Medical History:  Diagnosis Date  . Anxiety   . Atrial fibrillation ([place])   . CAD (coronary artery disease)    Shane Espinoza  . Gout   . Hypertension   . 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  Prior Rehab/Hospitalizations:  Has the patient had prior rehab or hospitalizations prior to admission? Yes  Has the patient had major surgery during 100 days prior to admission? No  Current  Medications   Current Facility-Administered Medications:  .   stroke: mapping our early stages of recovery book, , Does not apply, Once, Karmen Bongo, MD .  acetaminophen (TYLENOL) tablet 650 mg, 650 mg, Oral, Q4H PRN **OR** acetaminophen (TYLENOL) 160 MG/5ML solution 650 mg, 650 mg, Per Tube, Q4H PRN **OR** acetaminophen (TYLENOL) suppository 650 mg, 650 mg, Rectal, Q4H PRN, Karmen Bongo, MD .  allopurinol (ZYLOPRIM) tablet 100 mg, 100 mg, Oral, Daily, Karmen Bongo, MD, 100 mg at 08/10/20 0940 .  aspirin suppository  300 mg, 300 mg, Rectal, Daily **OR** aspirin tablet 325 mg, 325 mg, Oral, Daily, Karmen Bongo, MD, 325 mg at 08/10/20 0940 .  atorvastatin (LIPITOR) tablet 40 mg, 40 mg, Oral, QHS, Karmen Bongo, MD, 40 mg at 08/09/20 2222 .  cholecalciferol (VITAMIN D3) tablet 5,000 Units, 5,000 Units, Oral, Once per day on Mon Wed Fri, [place], Sylvester I, MD, 5,000 Units at 08/09/20 [id number] .  multivitamin (PROSIGHT) tablet 1 tablet, 1 tablet, Oral, Daily, Karmen Bongo, MD, 1 tablet at 08/10/20 0940 .  oxyCODONE-acetaminophen (PERCOCET/ROXICET) 5-325 MG per tablet 1 tablet, 1 tablet, Oral, Q6H PRN, Karmen Bongo, MD .  rivaroxaban Alveda Reasons) tablet 20 mg, 20 mg, Oral, Q supper, Karmen Bongo, MD, 20 mg at 08/09/20 1857 .  senna-docusate (Senokot-S) tablet 1 tablet, 1 tablet, Oral, QHS PRN, Karmen Bongo, MD .  vitamin B-12 (CYANOCOBALAMIN) tablet 1,000 mcg, 1,000 mcg, Oral, Daily, Dana Allan I, MD, 1,000 mcg at 08/10/20 0940  Patients Current Diet:  Diet Order            Diet Heart Room service appropriate? Yes; Fluid consistency: Thin  Diet effective ____                 Precautions / Restrictions Precautions Precautions: Fall Precaution Comments: Rt [place] when OOB Other Brace: fall in January resulting in R ankle fx Restrictions Weight Bearing Restrictions: Yes RLE Weight Bearing: Weight bearing as tolerated Other Position/Activity Restrictions: with [place]   Has the patient had 2 or more falls or a fall with injury in the past year?Yes  Prior Activity Level Community (5-7x/wk): Pt. was active int he community PTA  Prior Functional Level Prior Function Level of Independence: Independent with assistive device(s),Needs assistance (wife "watches") Gait / Transfers Assistance Needed: recent fall; states because of bad knee ADL's / Homemaking Assistance Needed: up until fracture he was completing his ADLs independently; wife has been Orthoptist / Swallowing  Assistance Needed: speech different from baseline  Self Care: Did the patient need help bathing, dressing, using the toilet or eating?  Independent  Indoor Mobility: Did the patient need assistance with walking from room to room (with or without device)? Independent  Stairs: Did the patient need assistance with internal or external stairs (with or without device)? Independent  Functional Cognition: Did the patient need help planning regular tasks such as shopping or remembering to take medications? [place] / Equipment Home Assistive Devices/Equipment: Environmental consultant (specify type) Home Equipment: Walker - 2 wheels,Wheelchair - manual,Shower seat - built in,Grab bars - tub/shower,Grab bars - toilet,Bedside commode,Cane - single point,Walker - 4 wheels  Prior Device Use: Indicate devices/aids used by the patient prior to current illness, exacerbation or injury? Walker  Current Functional Level Cognition  Arousal/Alertness: Awake/alert Overall Cognitive Status: Within Functional Limits for tasks assessed Current Attention Level: Selective Orientation Level: Oriented X4 Safety/Judgement: Decreased awareness of safety,Decreased awareness of deficits General Comments: Needs repetition at times to follow commands. Attention: Selective Selective Attention:  Appears intact Memory: Appears intact Awareness: Appears intact Problem Solving: Appears intact Safety/Judgment: Appears intact    Extremity Assessment (includes Sensation/Coordination)  Upper Extremity Assessment: LUE deficits/detail LUE Deficits / Details: decreased speed of rapid alternating movements: strength 4/5, decreased fine motor and gross motor coordination LUE Sensation: WNL LUE Coordination: decreased fine motor,decreased gross motor  Lower Extremity Assessment: RLE deficits/detail RLE Deficits / Details: Prior fracture - WBAT in camb[place]. Gross AROM is WFL at hip and knee. Gross strength 4+/5 in  quads, hamstrings, hip flexors. RLE Sensation: WNL LLE Deficits / Details: Gross strength 4- to 4/5 in quads, hamstrings, and hip flexors. LLE Sensation: WNL    ADLs  Overall ADL's : Needs assistance/impaired Eating/Feeding: Set up Grooming: Maximal assistance,Standing Grooming Details (indicate cue type and reason): pt with left lateral lean while washing hands at sink, required maxA to stabilize Upper Body Bathing: Minimal assistance,Sitting Lower Body Bathing: Maximal assistance,Sit to/from stand Upper Body Dressing : Moderate assistance,Sitting Lower Body Dressing: Maximal assistance,Sit to/from stand Toilet Transfer: Moderate assistance,RW Toilet Transfer Details (indicate cue type and reason): pt ambulated 10 feet with minimal assistance initially then requiring moderate assistance due to fatigue, LLE weakness Toileting- Clothing Manipulation and Hygiene: Total assistance Toileting - Clothing Manipulation Details (indicate cue type and reason): incontinenet of urine/BM; states he is unsure when he has to go Functional mobility during ADLs: Rolling walker,Moderate assistance General ADL Comments: ambulated short distance in room;decreased activity tolerance and endurance;required seat at sink level and then squat pivot to return to recliner    Mobility  Overal bed mobility: Needs Assistance Bed Mobility: Rolling,Sidelying to Sit Rolling: Min guard Sidelying to sit: Min assist,HOB elevated Supine to sit: Mod assist,HOB elevated Sit to supine: Mod assist General bed mobility comments: Able to get to EOB with Min A to steady once upright due to posterior lean. Increased time/effort due to difficulty scooting LLE out    Transfers  Overall transfer level: Needs assistance Equipment used: Rolling walker (2 wheeled) Transfer via Lift Equipment: Stedy Transfers: Sit to/from Stand Sit to Stand: Mod assist,Min assist,Min guard Stand pivot transfers: Max assist,+2 physical  assistance Squat pivot transfers: Mod assist General transfer comment: Initially mod A to stand from EOB wtih cues for hand placement, use of momentum and cues for anterior weight shift as pt with retropulsion at times. Progressed to Min A and Min guard for a few reps from chair with cues for technique. Performed STS from chair x5 with focus on slow descent and proper technique to rise pushing through UEs.    Ambulation / Gait / Stairs / Wheelchair Mobility  Ambulation/Gait Ambulation/Gait assistance: Min assist,+2 safety/equipment Gait Distance (Feet): 100 Feet Assistive device: Rolling walker (2 wheeled) Gait Pattern/deviations: Step-to pattern,Step-through pattern,Decreased stance time - left,Decreased step length - right,Trunk flexed General Gait Details: Slow, mildly unsteady gait with left knee instability and cues for RW proximity/management and for forward gaze. Gait velocity: decreased Gait velocity interpretation: <1.31 ft/sec, indicative of household ambulator    Posture / Balance Dynamic Sitting Balance Sitting balance - Comments: Requires UE support sitting EOB, posterior lOB with dynamic activities EOB. Balance Overall balance assessment: Needs assistance,History of Falls Sitting-balance support: Feet supported,Single extremity supported Sitting balance-Leahy Scale: Poor Sitting balance - Comments: Requires UE support sitting EOB, posterior lOB with dynamic activities EOB. Postural control: Posterior lean Standing balance support: During functional activity Standing balance-Leahy Scale: Poor Standing balance comment: Requires UE support in standing and cues for upright, posterior tendency.    Special needs/care  consideration  Designated visitor [place] (from acute therapy documentation) Living Arrangements: Spouse/significant other  Lives With: Spouse Available Help at Discharge: Available 24 hours/day Type of Home: House Home  Layout: Two level Alternate Level Stairs-Number of Steps: 13 steps (Has Stair lift) Home Access: Stairs to enter Entrance Stairs-Rails: Surveyor, mining of Steps: 3 Bathroom Shower/Tub: Multimedia programmer: Handicapped height Bathroom Accessibility: Yes How Accessible: Accessible via walker [place]: No  Discharge Living Setting Plans for Discharge Living Setting: House,Patient's home Type of Home at Discharge: House Discharge Home Layout: Other (Comment) (Has chair lift) Discharge Home Access: Stairs to enter Entrance Stairs-Rails: Right,Left Entrance Stairs-Number of Steps: 3 Discharge Bathroom Shower/Tub: Tub/shower unit Discharge Bathroom Toilet: Handicapped height Discharge Bathroom Accessibility: Yes How Accessible: Accessible via walker Does the patient have any problems obtaining your medications?: No  Social/Family/Support Systems Patient Roles: Spouse Contact Information: [phone] Anticipated Caregiver: Nicolis Boody Anticipated Caregiver's Contact Information: [phone] Ability/Limitations of Caregiver: Can provide Min A Caregiver Availability: 24/7 Discharge Plan Discussed with Primary Caregiver: Yes Is Caregiver In Agreement with Plan?: Yes Does Caregiver/Family have Issues with Lodging/Transportation while Pt is in Rehab?: No   Goals Patient/Family Goal for Rehab: PT/OT/SLP Supervision Expected length of stay: 12-14 days Pt/Family Agrees to Admission and willing to participate: Yes Program Orientation Provided & Reviewed with Pt/Caregiver Including Roles  & Responsibilities: Yes   Decrease burden of Care through IP rehab admission: Specialzed equipment needs, Decrease number of caregivers, Bowel and bladder program and Patient/family education   Possible need for SNF placement upon discharge:not anticipated   Patient Condition: This patient's condition remains as documented in the consult dated 08/09/2020, in  which the Rehabilitation Physician determined and documented that the patient's condition is appropriate for intensive rehabilitative care in an inpatient rehabilitation facility. Will admit to inpatient rehab today.  Preadmission Screen Completed By:  Genella Mech, CCC-SLP, 08/10/2020 2:31 PM ______________________________________________________________________   Discussed status with Dr. Ranell Patrick on 08/11/20 at 9:30 am and received approval for admission today.  Admission Coordinator:  Genella Mech, time 1043 Sudie Grumbling 08/11/20

## 2020-08-08 NOTE — Progress Notes (Signed)
Inpatient Rehab Admissions Coordinator:   Met with patient at bedside to discuss potential CIR admission. Pt. Stated interest. Wife can provide 247 support at d/c. Will pursue for potential admit next week, pending bed availability and insurance auth.  Clemens Catholic, [place], [place] Admissions Coordinator  [phone] ([place]) [phone] (office)

## 2020-08-08 NOTE — Progress Notes (Signed)
Physical Therapy Treatment Patient Details Name: Shane Espinoza MRN: [id number] DOB: [birth year] Today's Date: 08/08/2020    History of Present Illness 80 y.o. male right hand PMHx CAD s/p CABG x 4; CVA (2015); HTN; and afib (rivaroxaban) presenting with acute L-sided weakness. The patient was walking with his walker and then noticed acute left arm weakness, shaking and some leg weakness.MRI brain showed focus of restricted diffusion consistent with acute/subacute infarct in the periventricular white matter adjacent to the body of the right lateral ventricle    PT Comments    Patient progressing well towards PT goals. Highly motivated to work with therapist and participate in session to regain functional mobility. Requires Mod A for bed mobility and standing with max cues for technique. Tolerated gait training today with Min A for balance/safety and use of RW. Pt reports he has been doing there ex as much as he can with LLE over the last few days. Instructed pt in further there ex with emphasis on WB through [place]. Wife present and helpful. Pt is a great CIR candidate! Will follow.    Follow Up Recommendations  CIR;Supervision for mobility/OOB     Equipment Recommendations  None recommended by PT    Recommendations for Other Services Rehab consult     Precautions / Restrictions Precautions Precautions: Fall Precaution Comments: Rt [place] when OOB Required Braces or Orthoses: Other Brace Restrictions Weight Bearing Restrictions: Yes RLE Weight Bearing: Weight bearing as tolerated Other Position/Activity Restrictions: with [place]    Mobility  Bed Mobility Overal bed mobility: Needs Assistance Bed Mobility: Rolling;Sidelying to Sit Rolling: Min assist Sidelying to sit: Min assist;HOB elevated       General bed mobility comments: Step by step cues for sequencing, to reach for rail to roll onto right side and assist with trunk to get to EOB.  Transfers Overall  transfer level: Needs assistance Equipment used: Rolling walker (2 wheeled) Transfers: Sit to/from Stand Sit to Stand: Mod assist         General transfer comment: Assist to power to standing with cues for hand placement/technique; pt wanting to pull up on RW. Stood from [employer], from chair x1 with emphasize on equal WB through BUEs/LEs.  Ambulation/Gait Ambulation/Gait assistance: Min assist;+2 safety/equipment Gait Distance (Feet): 16 Feet (+25') Assistive device: Rolling walker (2 wheeled) Gait Pattern/deviations: Step-to pattern;Step-through pattern;Decreased stance time - left;Decreased step length - right;Trunk flexed Gait velocity: decreased   General Gait Details: SLow, mildly unsteady gait with left knee instability and cues for RW proximity/management. 1 seated rest break.   Stairs             Wheelchair Mobility    Modified Rankin (Stroke Patients Only) Modified Rankin (Stroke Patients Only) Pre-Morbid Rankin Score: Moderately severe disability Modified Rankin: Moderately severe disability     Balance Overall balance assessment: Needs assistance;History of Falls Sitting-balance support: Feet supported;Single extremity supported Sitting balance-Leahy Scale: Poor Sitting balance - Comments: Requires UE support sitting EOB, posterior lOB with dynamic activities EOB. Postural control: Posterior lean Standing balance support: During functional activity Standing balance-Leahy Scale: Poor Standing balance comment: Requires UE support in standing.                            Cognition Arousal/Alertness: Awake/alert Behavior During Therapy: WFL for tasks assessed/performed Overall Cognitive Status: Impaired/Different from baseline Area of Impairment: Memory;Safety/judgement  Memory: Decreased short-term memory   Safety/Judgement: Decreased awareness of safety;Decreased awareness of deficits     General Comments: Seems  much improved and likely closer to  baseline today; highly motivated. "I will do whatever you tell me to do, I want tog et back to caring for myself." Requires repetition to follow multi step commands.      Exercises Other Exercises Other Exercises: Instructed pt on pushing through BUEs and LLE with half stands to clear bottom from chair 5x2 with emphasize on LLE/LUE WB.    General Comments General comments (skin integrity, edema, etc.): Wife present and assisting with pushing IV pole; retired Marine scientist.      Pertinent Vitals/Pain Pain Assessment: No/denies pain    Home Living                      Prior Function            PT Goals (current goals can now be found in the care plan section) Progress towards PT goals: Progressing toward goals    Frequency    Min 4X/week      PT Plan Current plan remains appropriate    Co-evaluation              AM-PAC PT "6 Clicks" Mobility   Outcome Measure  Help needed turning from your back to your side while in a flat bed without using bedrails?: A Little Help needed moving from lying on your back to sitting on the side of a flat bed without using bedrails?: A Lot Help needed moving to and from a bed to a chair (including a wheelchair)?: A Lot Help needed standing up from a chair using your arms (e.g., wheelchair or bedside chair)?: A Lot Help needed to walk in hospital room?: A Little Help needed climbing 3-5 steps with a railing? : Total 6 Click Score: 13    End of Session Equipment Utilized During Treatment: Gait belt Activity Tolerance: Patient tolerated treatment well Patient left: in chair;with call bell/phone within reach;with chair alarm set;with family/visitor present Nurse Communication: Mobility status;Other (comment) (tx technique back to bed) PT Visit Diagnosis: Hemiplegia and hemiparesis;Unsteadiness on feet (R26.81) Hemiplegia - Right/Left: Left Hemiplegia - dominant/non-dominant:  Non-dominant Hemiplegia - caused by: Cerebral infarction     Time: 1002-1040 PT Time Calculation (min) (ACUTE ONLY): 38 min  Charges:  $Gait Training: 23-37 mins $Therapeutic Activity: 8-22 mins                     Marisa Severin, PT, DPT Acute Rehabilitation Services Pager [phone] Office [place] 08/08/2020, 12:38 PM

## 2020-08-09 DIAGNOSIS — G459 Transient cerebral ischemic attack, unspecified: Secondary | ICD-10-CM | POA: Diagnosis not present

## 2020-08-09 DIAGNOSIS — I4891 Unspecified atrial fibrillation: Secondary | ICD-10-CM | POA: Diagnosis not present

## 2020-08-09 LAB — BASIC METABOLIC PANEL
Anion gap: 12 (ref 5–15)
BUN: 14 mg/dL (ref 8–23)
CO2: 21 mmol/L — ABNORMAL LOW (ref 22–32)
Calcium: 9.2 mg/dL (ref 8.9–10.3)
Chloride: 106 mmol/L (ref 98–111)
Creatinine, Ser: 0.96 mg/dL (ref 0.61–1.24)
GFR, Estimated: >60 mL/min (ref >60)
Glucose, Bld: 119 mg/dL — ABNORMAL HIGH (ref 70–99)
Potassium: 3.9 mmol/L (ref 3.5–5.1)
Sodium: 139 mmol/L (ref 135–145)

## 2020-08-09 NOTE — Progress Notes (Signed)
Physical Therapy Treatment Patient Details Name: Shane Espinoza MRN: [id number] DOB: 02-11-1941 Today's Date: 08/09/2020    History of Present Illness 80 y.o. male right hand PMHx CAD s/p CABG x 4; CVA (2015); HTN; and afib (rivaroxaban) presenting with acute L-sided weakness. The patient was walking with his walker and then noticed acute left arm weakness, shaking and some leg weakness.MRI brain showed focus of restricted diffusion consistent with acute/subacute infarct in the periventricular white matter adjacent to the body of the right lateral ventricle    PT Comments    Pt progressing towards physical therapy goals. He was able to progress gait distance this session with close chair follow for safety. VC's throughout for sequencing and general safety. Pt motivated to work with therapy and hopeful for CIR. Continue to feel this patient would thrive in the CIR environment. Will continue to follow.     Follow Up Recommendations  CIR;Supervision for mobility/OOB     Equipment Recommendations  None recommended by PT    Recommendations for Other Services Rehab consult     Precautions / Restrictions Precautions Precautions: Fall Precaution Comments: Rt [place] when OOB Required Braces or Orthoses: Other Brace Restrictions Weight Bearing Restrictions: Yes RLE Weight Bearing: Weight bearing as tolerated Other Position/Activity Restrictions: with [place]    Mobility  Bed Mobility               General bed mobility comments: Pt was received sitting up in the recliner.  Transfers Overall transfer level: Needs assistance Equipment used: Rolling walker (2 wheeled) Transfers: Sit to/from Stand Sit to Stand: Min assist;Mod assist         General transfer comment: VC's for hand placement on seated surface for safety. Min to mod assist for power-up to full stand and stability as pt transitioned his hands from chair<>walker.  Ambulation/Gait Ambulation/Gait assistance:  Min assist;+2 safety/equipment Gait Distance (Feet): 75 Feet Assistive device: Rolling walker (2 wheeled) Gait Pattern/deviations: Step-to pattern;Step-through pattern;Decreased stance time - left;Decreased step length - right;Trunk flexed Gait velocity: decreased Gait velocity interpretation: <1.31 ft/sec, indicative of household ambulator General Gait Details: Slow, mildly unsteady gait with left knee instability and cues for RW proximity/management. 1 seated rest break.   Stairs             Wheelchair Mobility    Modified Rankin (Stroke Patients Only) Modified Rankin (Stroke Patients Only) Pre-Morbid Rankin Score: Moderately severe disability Modified Rankin: Moderately severe disability     Balance Overall balance assessment: Needs assistance;History of Falls Sitting-balance support: Feet supported;Single extremity supported Sitting balance-Leahy Scale: Poor Sitting balance - Comments: Requires UE support sitting EOB, posterior lOB with dynamic activities EOB. Postural control: Posterior lean Standing balance support: During functional activity Standing balance-Leahy Scale: Poor Standing balance comment: Requires UE support in standing.                            Cognition Arousal/Alertness: Awake/alert Behavior During Therapy: WFL for tasks assessed/performed Overall Cognitive Status: Impaired/Different from baseline Area of Impairment: Memory;Safety/judgement                   Current Attention Level: Selective Memory: Decreased short-term memory   Safety/Judgement: Decreased awareness of safety;Decreased awareness of deficits Awareness: Emergent Problem Solving: Slow processing        Exercises General Exercises - Lower Extremity Ankle Circles/Pumps: 10 reps Quad Sets: 10 reps Long Arc Quad: 10 reps Hip ABduction/ADduction: 10 reps  General Comments        Pertinent Vitals/Pain Pain Assessment: No/denies pain Pain  Intervention(s): Monitored during session    Home Living                      Prior Function            PT Goals (current goals can now be found in the care plan section) Acute Rehab PT Goals Patient Stated Goal: to get better PT Goal Formulation: With patient/family Time For Goal Achievement: 08/20/20 Potential to Achieve Goals: Good Progress towards PT goals: Progressing toward goals    Frequency    Min 4X/week      PT Plan Current plan remains appropriate    Co-evaluation              AM-PAC PT "6 Clicks" Mobility   Outcome Measure  Help needed turning from your back to your side while in a flat bed without using bedrails?: A Little Help needed moving from lying on your back to sitting on the side of a flat bed without using bedrails?: A Lot Help needed moving to and from a bed to a chair (including a wheelchair)?: A Lot Help needed standing up from a chair using your arms (e.g., wheelchair or bedside chair)?: A Little Help needed to walk in hospital room?: A Little Help needed climbing 3-5 steps with a railing? : A Lot 6 Click Score: 15    End of Session Equipment Utilized During Treatment: Gait belt Activity Tolerance: Patient tolerated treatment well Patient left: in chair;with call bell/phone within reach;with chair alarm set;with family/visitor present Nurse Communication: Mobility status PT Visit Diagnosis: Hemiplegia and hemiparesis;Unsteadiness on feet (R26.81) Hemiplegia - Right/Left: Left Hemiplegia - dominant/non-dominant: Non-dominant Hemiplegia - caused by: Cerebral infarction     Time: 1610-9604 PT Time Calculation (min) (ACUTE ONLY): 29 min  Charges:  $Gait Training: 8-22 mins $Therapeutic Exercise: 8-22 mins                     Rolinda Roan, PT, DPT Acute Rehabilitation Services Pager: [phone] Office: [place] 08/09/2020, 3:07 PM

## 2020-08-09 NOTE — Progress Notes (Signed)
Occupational Therapy Treatment Patient Details Name: Shane Espinoza MRN: [id number] DOB: 1940/08/01 Today's Date: 08/09/2020    History of present illness 80 y.o. male right hand PMHx CAD s/p CABG x 4; CVA (2015); HTN; and afib (rivaroxaban) presenting with acute L-sided weakness. The patient was walking with his walker and then noticed acute left arm weakness, shaking and some leg weakness.MRI brain showed focus of restricted diffusion consistent with acute/subacute infarct in the periventricular white matter adjacent to the body of the right lateral ventricle   OT comments  Pt received in recliner with his wife present and pt agreeable to OT session. Pt educated with provided handout on fine motor coordination exercises with use of theraputty and fine motor coordination activities. Pt demonstrated understanding. Pt required moderate assistance to powerup into standing, he ambulated 10 feet at RW level with minimal to moderate assistance and required moderate to maximum assistance for grooming at sink level while standing. Pt demonstrated instability and left lateral lean while standing without UE support. Pt continues to demonstrate decreased activity tolerance, generalized weakness, LUE weakness, and instability impacting his safety and independence with ADL/IADL and functional mobility. He is highly motivated and will continue to benefit from continued skilled OT services.    Follow Up Recommendations  CIR;Supervision/Assistance - 24 hour    Equipment Recommendations  Hospital bed    Recommendations for Other Services Rehab consult;PT consult;Speech consult    Precautions / Restrictions Precautions Precautions: Fall Precaution Comments: Rt [place] when OOB Required Braces or Orthoses: Other Brace Other Brace: fall in January resulting in R ankle fx Restrictions Weight Bearing Restrictions: Yes RLE Weight Bearing: Weight bearing as tolerated Other Position/Activity Restrictions:  with [place]       Mobility Bed Mobility Overal bed mobility: Needs Assistance             General bed mobility comments: Pt was received sitting up in the recliner.  Transfers Overall transfer level: Needs assistance Equipment used: Rolling walker (2 wheeled) Transfers: Sit to/from [employer] Sit to Stand: Mod assist   Squat pivot transfers: Mod assist     General transfer comment: moda to stand from recliner, pt required seated rest break after standing at sink level, unable to progress into standing from lower surface, required modA for squat pivot to return to recliner    Balance Overall balance assessment: Needs assistance;History of Falls Sitting-balance support: Feet supported;Single extremity supported Sitting balance-Leahy Scale: Poor Sitting balance - Comments: Requires UE support sitting EOB, posterior lOB with dynamic activities EOB. Postural control: Posterior lean;Left lateral lean Standing balance support: During functional activity Standing balance-Leahy Scale: Poor Standing balance comment: Requires UE support in standing.pt attempted to wash hands while standing at sink level, demonstrated left lateral lean without UE support, required moderate to maximum assistance to correct posture to midline                           ADL either performed or assessed with clinical judgement   ADL Overall ADL's : Needs assistance/impaired Eating/Feeding: Set up   Grooming: Maximal assistance;Standing Grooming Details (indicate cue type and reason): pt with left lateral lean while washing hands at sink, required maxA to stabilize             Lower Body Dressing: Maximal assistance;Sit to/from stand   Toilet Transfer: Moderate assistance;RW Toilet Transfer Details (indicate cue type and reason): pt ambulated 10 feet with minimal assistance initially then requiring moderate assistance  due to fatigue, LLE weakness         Functional  mobility during ADLs: Rolling walker;Moderate assistance General ADL Comments: ambulated short distance in room;decreased activity tolerance and endurance;required seat at sink level and then squat pivot to return to recliner     Vision       Perception     Praxis      Cognition Arousal/Alertness: Awake/alert Behavior During Therapy: WFL for tasks assessed/performed Overall Cognitive Status: Within Functional Limits for tasks assessed Area of Impairment: Memory;Safety/judgement                   Current Attention Level: Selective Memory: Decreased short-term memory   Safety/Judgement: Decreased awareness of safety;Decreased awareness of deficits Awareness: Emergent Problem Solving: Slow processing General Comments: pt scored 0/28 on short blessed test, screener for cognitive limitations, pt's score indicates normal cognitive functioning        Exercises Exercises: Other exercises General Exercises - Lower Extremity Ankle Circles/Pumps: 10 reps Quad Sets: 10 reps Long Arc Quad: 10 reps Hip ABduction/ADduction: 10 reps Other Exercises Other Exercises: educated pt with provided handouts on fine motor coordination activities and exercises with theraputty. educated pt to choose 3-4exercises and complete 10x each 3x/day   Shoulder Instructions       General Comments wife present during session    Pertinent Vitals/ Pain       Pain Assessment: No/denies pain Pain Intervention(s): Monitored during session;Limited activity within patient's tolerance  Home Living                                          Prior Functioning/Environment              Frequency  Min 2X/week        Progress Toward Goals  OT Goals(current goals can now be found in the care plan section)  Progress towards OT goals: Progressing toward goals  Acute Rehab OT Goals Patient Stated Goal: to get better OT Goal Formulation: With patient/family Time For Goal  Achievement: 08/20/20 Potential to Achieve Goals: Good ADL Goals Pt Will Perform Grooming: with set-up;sitting Pt Will Perform Upper Body Bathing: with set-up;with supervision;sitting Pt Will Perform Lower Body Bathing: with min assist;sitting/lateral leans;sit to/from stand Pt Will Perform Upper Body Dressing: with supervision;with set-up;sitting Pt Will Transfer to Toilet: with min assist;with +2 assist;bedside commode;stand pivot transfer Additional ADL Goal #1: Pt will maintain midline postural control EOB independently inpreparation for ADL tasks  Plan Discharge plan remains appropriate    Co-evaluation                 AM-PAC OT "6 Clicks" Daily Activity     Outcome Measure   Help from another person eating meals?: A Little Help from another person taking care of personal grooming?: A Little Help from another person toileting, which includes using toliet, bedpan, or urinal?: A Lot Help from another person bathing (including washing, rinsing, drying)?: A Lot Help from another person to put on and taking off regular upper body clothing?: A Little Help from another person to put on and taking off regular lower body clothing?: A Lot 6 Click Score: 15    End of Session Equipment Utilized During Treatment: Gait belt;Rolling walker  OT Visit Diagnosis: Unsteadiness on feet (R26.81);Other abnormalities of gait and mobility (R26.89);Muscle weakness (generalized) (M62.81);History of falling (Z91.81);Ataxia, unspecified (R27.0);Other symptoms and signs involving  cognitive function;Pain;Hemiplegia and hemiparesis Hemiplegia - Right/Left: Left Hemiplegia - dominant/non-dominant: Non-Dominant Hemiplegia - caused by: Cerebral infarction Pain - Right/Left: Right Pain - part of body: Knee   Activity Tolerance Patient tolerated treatment well   Patient Left with call bell/phone within reach;with family/visitor present;in chair;with chair alarm set   Nurse Communication Mobility  status        Time: [id number] OT Time Calculation (min): 62 min  Charges: OT General Charges $OT Visit: 1 Visit OT Treatments $Self Care/Home Management : 38-52 mins $Therapeutic Exercise: 8-22 mins  Helene Kelp OTR/L Acute Rehabilitation Services Office: [place] 08/09/2020, 3:18 PM

## 2020-08-09 NOTE — Progress Notes (Signed)
Inpatient Rehab Admissions Coordinator:   I opened a case for CIR admit with pt.'s insurance this AM. I do not yet have a bed or insurance auth for him today. Will follow for potential admit pending insurance auth and bed availability.   Clemens Catholic, [place], [place] Admissions Coordinator  [phone] ([place]) [phone] (office)

## 2020-08-09 NOTE — Progress Notes (Signed)
PROGRESS NOTE    Shane Espinoza  HER:[id number] DOB: Mar 13, 1941 DOA: 08/05/2020 PCP: Josetta Huddle, MD   Brief Narrative:  This 80 years old male with PMH significant for CAD s/p CABG x 4, CVA(2015), HTN and A. fib on Xarelto presents in the emergency department with left-sided weakness.  About 4 weeks ago Patient injured his right ankle, found to have bilateral malleolar fracture and was advised non weightbearing.  Patient was seen by orthopedics 2 days prior to the presentation and advised to start weightbearing.  He reported his left-side gave away and he almost collapsed with left sided weakness.  MRI of the brain done in the ER revealed acute infarct.  Left sided weakness is improving.  Neurology was consulted, work-up has been completed.  PT and OT recommended CIR,  awaiting insurance authorization and availability of bed at CIR.  Assessment & Plan:   Principal Problem:   TIA (transient ischemic attack) Active Problems:   Hypertension   S/P CABG x 4   Hyperlipidemia with target LDL less than 70   Atrial fibrillation (HCC)   Acute CVA (cerebrovascular accident) ([place])   TIA/CVA -Patient presented with acute onset of L-sided weakness. -Neurology was consulted recommended stroke work-up. -Continue Telemetry monitoring -MRI brain revealed acute and subacute infarcts. -Carotid doppler result is pending. -Follow other work-up.  MRA head is nonrevealing.  MRA neck result is noted.   -Echo revealed EF of 55 to 60%, mild MR and mild to moderate aortic sclerosis/calcification without aortic stenosis. -HbA1c 6.5%. -Physical therapy input is highly appreciated. --As per physical therapy notes, patient still has functional limitations. -CIR has been recommended. 08/08/2020: Patient is awaiting CIR  HTN -Initially, permissive hypertension was utilized. -Patient's blood pressure has remained controlled. -Currently on Norvasc, Toprol XL, Diovan, Lasix and Aldactonehave been on hold.   -Will resume as BP remains elevated.  HLD -LDL is 45. -Total cholesterol is 101. -Patient is currently on Lipitor 40 Mg p.o. nightly.  CAD s/p CABG - Remote CABG x 4v - Denies chest pain. - Continue all cardio protective meds.  Afib -Rate controlled with Toprol - but holding for permissive HTN -Continue Xarelto. -There are concerns and how the patient may be taking Xarelto.      DVT prophylaxis: Xarelto Code Status: Full code Family Communication: Wife Disposition Plan:  Status is: Inpatient  Remains inpatient appropriate because:Inpatient level of care appropriate due to severity of illness   Dispo: The patient is from: Home              Anticipated d/c is to: CIR              Anticipated d/c date is: 1 day              Patient currently is not medically stable to d/c.   Difficult to place patient No   Consultants:   Neurology   Procedures: , MRI, echocardiogram  Antimicrobials:    Anti-infectives (From admission, onward)   None      Subjective: Patient was seen and examined at bedside.  Overnight events noted.  Patient reports left-sided weakness is improving.  His right leg is in cast because of the recent bimalleolar fracture.  Objective: Vitals:   08/08/20 1950 08/08/20 2335 08/09/20 0400 08/09/20 0832  BP: (!) 143/88 129/79 (!) 140/92 137/75  Pulse: 67 72 83 68  Resp: 19 16 18 16   Temp: 97.8 F (36.6 C) 97.8 F (36.6 C) 97.8 F (36.6 C) 98.4 F (36.9  C)  TempSrc: Oral Oral Oral Oral  SpO2: 100% 97% 99% 91%  Weight:      Height:        Intake/Output Summary (Last 24 hours) at 08/09/2020 1149 Last data filed at 08/09/2020 0640 Gross per 24 hour  Intake 540 ml  Output 1600 ml  Net -1060 ml   Filed Weights   08/05/20 1740 08/06/20 0420  Weight: 92.5 kg 93.7 kg    Examination:  General exam: Appears calm and comfortable , not in any distress. Respiratory system: Clear to auscultation. Respiratory effort normal. Cardiovascular  system: S1 & S2 heard, RRR. No JVD, murmurs, rubs, gallops or clicks. No pedal edema. Gastrointestinal system: Abdomen is nondistended, soft and nontender. No organomegaly or masses felt. Normal bowel sounds heard. Central nervous system: Alert and oriented. No focal neurological deficits. Extremities:  No edema, no cyanosis, no clubbing. Right leg in cast.  Skin: No rashes, lesions or ulcers Psychiatry: Judgement and insight appear normal. Mood & affect appropriate.     Data Reviewed: I have personally reviewed following labs and imaging studies  CBC: Recent Labs  Lab 08/05/20 1033 08/05/20 1058  WBC 8.7  --   NEUTROABS 6.0  --   HGB 15.4 17.0  HCT 48.8 50.0  MCV 93.8  --   PLT 194  --    Basic Metabolic Panel: Recent Labs  Lab 08/05/20 1033 08/05/20 1058  NA 138 136  K 3.8 5.4*  CL 103 104  CO2 24  --   GLUCOSE 132* 130*  BUN 17 28*  CREATININE 1.16 1.00  CALCIUM 9.3  --    GFR: Estimated Creatinine Clearance: 66.6 mL/min (by C-G formula based on SCr of 1 mg/dL). Liver Function Tests: Recent Labs  Lab 08/05/20 1033  AST 29  ALT 22  ALKPHOS 74  BILITOT 1.3*  PROT 7.1  ALBUMIN 3.3*   No results for input(s): LIPASE, AMYLASE in the last 168 hours. No results for input(s): AMMONIA in the last 168 hours. Coagulation Profile: Recent Labs  Lab 08/05/20 1033  INR 2.2*   Cardiac Enzymes: No results for input(s): CKTOTAL, CKMB, CKMBINDEX, TROPONINI in the last 168 hours. BNP (last 3 results) No results for input(s): PROBNP in the last 8760 hours. HbA1C: No results for input(s): HGBA1C in the last 72 hours. CBG: No results for input(s): GLUCAP in the last 168 hours. Lipid Profile: No results for input(s): CHOL, HDL, LDLCALC, TRIG, CHOLHDL, LDLDIRECT in the last 72 hours. Thyroid Function Tests: No results for input(s): TSH, T4TOTAL, FREET4, T3FREE, THYROIDAB in the last 72 hours. Anemia Panel: No results for input(s): VITAMINB12, FOLATE, FERRITIN, TIBC,  IRON, RETICCTPCT in the last 72 hours. Sepsis Labs: No results for input(s): PROCALCITON, LATICACIDVEN in the last 168 hours.  Recent Results (from the past 240 hour(s))  SARS CORONAVIRUS 2 (TAT 6-24 HRS) Nasopharyngeal Nasopharyngeal Swab     Status: None   Collection Time: 08/05/20  1:15 PM   Specimen: Nasopharyngeal Swab  Result Value Ref Range Status   SARS Coronavirus 2 NEGATIVE NEGATIVE Final    Comment: (NOTE) SARS-CoV-2 target nucleic acids are NOT DETECTED.  The SARS-CoV-2 RNA is generally detectable in upper and lower respiratory specimens during the acute phase of infection. Negative results do not preclude SARS-CoV-2 infection, do not rule out co-infections with other pathogens, and should not be used as the sole basis for treatment or other patient management decisions. Negative results must be combined with clinical observations, patient history, and epidemiological information.  The expected result is Negative.  Fact Sheet for Patients: SugarRoll.be  Fact Sheet for Healthcare Providers: https://www.woods-mathews.com/  This test is not yet approved or cleared by the [place] FDA and  has been authorized for detection and/or diagnosis of SARS-CoV-2 by FDA under an Emergency Use Authorization (EUA). This EUA will remain  in effect (meaning this test can be used) for the duration of the COVID-19 declaration under Se ction 564(b)(1) of the Act, 21 U.S.C. section 360bbb-3(b)(1), unless the authorization is terminated or revoked sooner.  Performed at [facility], [place] [address] [place], [place] [address]     Radiology Studies: No results found.  Scheduled Meds: .  stroke: mapping our early stages of recovery book   Does not apply Once  . allopurinol  100 mg Oral Daily  . aspirin  300 mg Rectal Daily   Or  . aspirin  325 mg Oral Daily  . atorvastatin  40 mg Oral QHS  . cholecalciferol  5,000 Units Oral Once  per day on Mon Wed Fri  . multivitamin  1 tablet Oral Daily  . rivaroxaban  20 mg Oral Q supper  . vitamin B-12  1,000 mcg Oral Daily   Continuous Infusions:   LOS: 3 days    Time spent: 35 mins    Maximilliano Kersh, MD [facility]   If 7PM-7AM, please contact night-coverage

## 2020-08-09 NOTE — Progress Notes (Signed)
Inpatient Rehab Admissions Coordinator:   I received insurance authorization for CIR admit. I do not have a bed for Pt. Today but will follow for potential admit pending bed availability.   Clemens Catholic, [place], [place] Admissions Coordinator  [phone] ([place]) [phone] (office)

## 2020-08-10 DIAGNOSIS — I4891 Unspecified atrial fibrillation: Secondary | ICD-10-CM | POA: Diagnosis not present

## 2020-08-10 LAB — BASIC METABOLIC PANEL
Anion gap: 9 (ref 5–15)
BUN: 16 mg/dL (ref 8–23)
CO2: 23 mmol/L (ref 22–32)
Calcium: 8.9 mg/dL (ref 8.9–10.3)
Chloride: 106 mmol/L (ref 98–111)
Creatinine, Ser: 0.96 mg/dL (ref 0.61–1.24)
GFR, Estimated: >60 mL/min (ref >60)
Glucose, Bld: 113 mg/dL — ABNORMAL HIGH (ref 70–99)
Potassium: 4.3 mmol/L (ref 3.5–5.1)
Sodium: 138 mmol/L (ref 135–145)

## 2020-08-10 LAB — MAGNESIUM: Magnesium: 2 mg/dL (ref 1.7–2.4)

## 2020-08-10 LAB — CBC
HCT: 44.4 % (ref 39.0–52.0)
Hemoglobin: 14.4 g/dL (ref 13.0–17.0)
MCH: 30.2 pg (ref 26.0–34.0)
MCHC: 32.4 g/dL (ref 30.0–36.0)
MCV: 93.1 fL (ref 80.0–100.0)
Platelets: 179 10*3/uL (ref 150–400)
RBC: 4.77 MIL/uL (ref 4.22–5.81)
RDW: 13.2 % (ref 11.5–15.5)
WBC: 9.1 10*3/uL (ref 4.0–10.5)
nRBC: 0 % (ref 0.0–0.2)

## 2020-08-10 LAB — PHOSPHORUS: Phosphorus: 3.7 mg/dL (ref 2.5–4.6)

## 2020-08-10 LAB — GLUCOSE, CAPILLARY: Glucose-Capillary: 138 mg/dL — ABNORMAL HIGH (ref 70–99)

## 2020-08-10 NOTE — Progress Notes (Signed)
PROGRESS NOTE    Shane Espinoza  TKZ:[id number] DOB: January 31, 1941 DOA: 08/05/2020 PCP: Josetta Huddle, MD   Brief Narrative:  This 80 years old male with PMH significant for CAD s/p CABG x 4, CVA(2015), HTN and A. fib on Xarelto presents in the emergency department with left-sided weakness.  About 4 weeks ago Patient injured his right ankle, found to have bilateral malleolar fracture and was advised non weightbearing.  Patient was seen by orthopedics 2 days prior to the presentation and advised to start weightbearing.  He reported his left-side gave away and he almost collapsed with left sided weakness.  MRI of the brain done in the ER revealed acute infarct.  Left sided weakness is improving. Neurology was consulted, work-up has been completed.  PT and OT recommended CIR,  awaiting insurance authorization and availability of bed at CIR.  Assessment & Plan:   Principal Problem:   TIA (transient ischemic attack) Active Problems:   Hypertension   S/P CABG x 4   Hyperlipidemia with target LDL less than 70   Atrial fibrillation (HCC)   Acute CVA (cerebrovascular accident) ([place])   TIA/CVA -Patient presented with acute onset of L-sided weakness. -Neurology was consulted recommended stroke work-up. -Continue Telemetry monitoring -MRI brain revealed acute and subacute infarcts. -Carotid doppler  Not significant stenosis. -Follow other work-up.  MRA head is nonrevealing.  MRA neck result is noted.   -Echo revealed EF of 55 to 60%, mild MR and mild to moderate aortic sclerosis/calcification without aortic stenosis. -HbA1c 6.5%. -Physical therapy input is highly appreciated. --As per physical therapy notes, patient still has functional limitations. -CIR has been recommended. 08/08/2020: Patient is awaiting CIR  HTN -Initially, permissive hypertension was utilized. -Patient's blood pressure has remained controlled. -Currently on Norvasc, Toprol XL, Diovan, Lasix and Aldactonehave been on  hold.  -Will resume as BP remains elevated.  HLD -LDL is 45. -Total cholesterol is 101. -Patient is currently on Lipitor 40 Mg p.o. nightly.  CAD s/p CABG - Remote CABG x 4v - Denies chest pain. - Continue all cardio protective meds.  Afib -Rate controlled with Toprol - but holding for permissive HTN -Continue Xarelto.    DVT prophylaxis: Xarelto Code Status: Full code Family Communication: Wife Disposition Plan:  Status is: Inpatient  Remains inpatient appropriate because:Inpatient level of care appropriate due to severity of illness   Dispo: The patient is from: Home              Anticipated d/c is to: CIR              Anticipated d/c date is: 1 day              Patient currently is not medically stable to d/c.   Difficult to place patient No   Consultants:   Neurology   Procedures: , MRI, echocardiogram  Antimicrobials:    Anti-infectives (From admission, onward)   None      Subjective: Patient was seen and examined at bedside.  Overnight events noted.   Patient reports left-sided weakness is improving.  His right leg is in cast because of the recent bimalleolar fracture. He was sitting comfortably on chair, states has participated in physical therapy.  Objective: Vitals:   08/09/20 1952 08/09/20 2348 08/10/20 0419 08/10/20 0814  BP: (!) 145/84 (!) 147/85 131/76 (!) 149/83  Pulse: 83 63 61 78  Resp: 17 18 18 18   Temp: 98.4 F (36.9 C) 98.4 F (36.9 C) 98.3 F (36.8 C) 98.1 F (  36.7 C)  TempSrc: Oral Oral Oral Oral  SpO2: 100% 95% 96% 98%  Weight:      Height:        Intake/Output Summary (Last 24 hours) at 08/10/2020 1156 Last data filed at 08/09/2020 2349 Gross per 24 hour  Intake 240 ml  Output 500 ml  Net -260 ml   Filed Weights   08/05/20 1740 08/06/20 0420  Weight: 92.5 kg 93.7 kg    Examination:  General exam: Appears calm and comfortable , not in any distress. Respiratory system: Clear to auscultation. Respiratory effort  normal. Cardiovascular system: S1 & S2 heard, RRR. No JVD, murmurs, rubs, gallops or clicks. No pedal edema. Gastrointestinal system: Abdomen is nondistended, soft and nontender. No organomegaly or masses felt. Normal bowel sounds heard. Central nervous system: Alert and oriented. No focal neurological deficits. Extremities:  No edema, no cyanosis, no clubbing. Right leg in cast.  Skin: No rashes, lesions or ulcers Psychiatry: Judgement and insight appear normal. Mood & affect appropriate.     Data Reviewed: I have personally reviewed following labs and imaging studies  CBC: Recent Labs  Lab 08/05/20 1033 08/05/20 1058 08/10/20 0321  WBC 8.7  --  9.1  NEUTROABS 6.0  --   --   HGB 15.4 17.0 14.4  HCT 48.8 50.0 44.4  MCV 93.8  --  93.1  PLT 194  --  097   Basic Metabolic Panel: Recent Labs  Lab 08/05/20 1033 08/05/20 1058 08/09/20 1035 08/10/20 0321  NA 138 136 139 138  K 3.8 5.4* 3.9 4.3  CL 103 104 106 106  CO2 24  --  21* 23  GLUCOSE 132* 130* 119* 113*  BUN 17 28* 14 16  CREATININE 1.16 1.00 0.96 0.96  CALCIUM 9.3  --  9.2 8.9  MG  --   --   --  2.0  PHOS  --   --   --  3.7   GFR: Estimated Creatinine Clearance: 69.4 mL/min (by C-G formula based on SCr of 0.96 mg/dL). Liver Function Tests: Recent Labs  Lab 08/05/20 1033  AST 29  ALT 22  ALKPHOS 74  BILITOT 1.3*  PROT 7.1  ALBUMIN 3.3*   No results for input(s): LIPASE, AMYLASE in the last 168 hours. No results for input(s): AMMONIA in the last 168 hours. Coagulation Profile: Recent Labs  Lab 08/05/20 1033  INR 2.2*   Cardiac Enzymes: No results for input(s): CKTOTAL, CKMB, CKMBINDEX, TROPONINI in the last 168 hours. BNP (last 3 results) No results for input(s): PROBNP in the last 8760 hours. HbA1C: No results for input(s): HGBA1C in the last 72 hours. CBG: Recent Labs  Lab 08/10/20 1130  GLUCAP 138*   Lipid Profile: No results for input(s): CHOL, HDL, LDLCALC, TRIG, CHOLHDL, LDLDIRECT in  the last 72 hours. Thyroid Function Tests: No results for input(s): TSH, T4TOTAL, FREET4, T3FREE, THYROIDAB in the last 72 hours. Anemia Panel: No results for input(s): VITAMINB12, FOLATE, FERRITIN, TIBC, IRON, RETICCTPCT in the last 72 hours. Sepsis Labs: No results for input(s): PROCALCITON, LATICACIDVEN in the last 168 hours.  Recent Results (from the past 240 hour(s))  SARS CORONAVIRUS 2 (TAT 6-24 HRS) Nasopharyngeal Nasopharyngeal Swab     Status: None   Collection Time: 08/05/20  1:15 PM   Specimen: Nasopharyngeal Swab  Result Value Ref Range Status   SARS Coronavirus 2 NEGATIVE NEGATIVE Final    Comment: (NOTE) SARS-CoV-2 target nucleic acids are NOT DETECTED.  The SARS-CoV-2 RNA is generally detectable  in upper and lower respiratory specimens during the acute phase of infection. Negative results do not preclude SARS-CoV-2 infection, do not rule out co-infections with other pathogens, and should not be used as the sole basis for treatment or other patient management decisions. Negative results must be combined with clinical observations, patient history, and epidemiological information. The expected result is Negative.  Fact Sheet for Patients: SugarRoll.be  Fact Sheet for Healthcare Providers: https://www.woods-mathews.com/  This test is not yet approved or cleared by the [place] FDA and  has been authorized for detection and/or diagnosis of SARS-CoV-2 by FDA under an Emergency Use Authorization (EUA). This EUA will remain  in effect (meaning this test can be used) for the duration of the COVID-19 declaration under Se ction 564(b)(1) of the Act, 21 U.S.C. section 360bbb-3(b)(1), unless the authorization is terminated or revoked sooner.  Performed at [facility], [place] [address] [place], [place] [address]     Radiology Studies: No results found.  Scheduled Meds: .  stroke: mapping our early stages of recovery  book   Does not apply Once  . allopurinol  100 mg Oral Daily  . aspirin  300 mg Rectal Daily   Or  . aspirin  325 mg Oral Daily  . atorvastatin  40 mg Oral QHS  . cholecalciferol  5,000 Units Oral Once per day on Mon Wed Fri  . multivitamin  1 tablet Oral Daily  . rivaroxaban  20 mg Oral Q supper  . vitamin B-12  1,000 mcg Oral Daily   Continuous Infusions:   LOS: 4 days    Time spent: 25 mins    Shawna Clamp, MD [facility]   If 7PM-7AM, please contact night-coverage

## 2020-08-10 NOTE — H&P (Incomplete)
Physical Medicine and Rehabilitation Admission H&P    Chief Complaint  Patient presents with  . Weakness  : HPI: Shane Espinoza is an 80 year old right-handed male history of anxiety, atrial fibrillation, CVA 2015, CAD with CABG 2012 maintained on Xarelto as well as recent right ankle nondisplaced fracture of the right lateral malleolus with additional findings suggestive of a small avulsion fracture adjacent to the right medial malleolus maintained in a cam boot and weightbearing as tolerated. Presented 08/05/2020 with acute onset of left-sided weakness and lightheadedness. CT/MRI showed focus of restricted diffusion consistent with acute subacute infarction in the periventricular white matter adjacent to the body of the right lateral ventricle. MRA unremarkable. Patient did not receive TPA. Echocardiogram with ejection fraction of 55 to 60% no wall motion abnormalities grade 1 diastolic dysfunction. Carotid Dopplers with no ICA stenosis. Admission chemistries unremarkable except glucose 132 urinalysis positive nitrite. Neurology consulted currently maintained on aspirin as well as Xarelto for CVA prophylaxis. Patient is tolerating a regular consistency diet. Physical medicine rehab consult was consulted to assess candidacy for CIR given impaired mobility and ADLs and patient was admitted for a comprehensive rehab program.  Review of Systems  Constitutional: Negative for chills and fever.  HENT: Positive for hearing loss.   Eyes: Negative for blurred vision and double vision.  Respiratory: Negative for cough and shortness of breath.   Cardiovascular: Positive for palpitations. Negative for chest pain.  Gastrointestinal: Positive for constipation. Negative for heartburn, nausea and vomiting.  Genitourinary: Negative for dysuria, flank pain and hematuria.  Musculoskeletal: Positive for myalgias.  Skin: Negative for rash.  Neurological: Positive for weakness.  Psychiatric/Behavioral:        Anxiety  All other systems reviewed and are negative.  Past Medical History:  Diagnosis Date  . Anxiety   . Atrial fibrillation ([place])   . CAD (coronary artery disease)    [name]  . Gout   . Hypertension   . 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 CARDIAC CATHETERIZATION  03/02/2011   Recommended CABG  . COLON SURGERY    . CORONARY ARTERY BYPASS GRAFT  03/07/2011   DR.VAN  TRIGHT  . EYE SURGERY    . LEXISCAN MYOVIEW  02/20/2012   EKG negative for ischemia, noraml study  . TRANSTHORACIC ECHOCARDIOGRAM  02/20/2012   EF 50-55%, mild-moderate concentric LVH, LA moderately dilated, moderate mitral regurg  . VASCULAR DOPPLER  03/03/2011   Nosignificant extracranial carotid artery stenosis   Family History  Problem Relation Age of Onset  . CVA Father   . Heart attack Maternal Grandmother   . Heart attack Maternal Grandfather   . Heart murmur Sister   . Hypertension Son    Social History:  reports that he has never smoked. He has never used smokeless tobacco. He reports that he does not drink alcohol and does not use drugs. Allergies: No Known Allergies Medications Prior to Admission  Medication Sig Dispense Refill  . acetaminophen (TYLENOL) 500 MG tablet Take 500 mg by mouth every 6 (six) hours as needed for moderate pain.    Marland Kitchen allopurinol (ZYLOPRIM) 100 MG tablet Take 1 tablet by mouth daily.  2  . amLODipine (NORVASC) 5 MG tablet Take 2.5 mg by mouth every evening.    . beta carotene w/minerals (OCUVITE) tablet Take 1 tablet by mouth daily.    . Cholecalciferol (VITAMIN D3) 5000 UNITS CAPS Take by mouth. Monday,  Wednesday, Friday    . fish oil-omega-3 fatty acids 1000 MG capsule Take 1 g by mouth 3 (three) times a week.    . furosemide (LASIX) 20 MG tablet TAKE 1 TABLET DAILY, TAKE 1EXTRA TABLET AS NEEDED FOR SWELLING 180 tablet 3  . HYDROcodone-acetaminophen (NORCO/VICODIN) 5-325 MG tablet Take 1 tablet by mouth every 4  (four) hours as needed for moderate pain or severe pain.    . metoprolol succinate (TOPROL-XL) 25 MG 24 hr tablet Take 1 tablet (25 mg total) by mouth daily. 90 tablet 3  . oxyCODONE-acetaminophen (PERCOCET/ROXICET) 5-325 MG tablet Take 1 tablet by mouth every 6 (six) hours as needed for moderate pain or severe pain.    . Probiotic Product (ALIGN) 4 MG CAPS Take 1 capsule by mouth every other day.    . simvastatin (ZOCOR) 20 MG tablet Take 1 tablet (20 mg total) by mouth at bedtime. 90 tablet 3  . spironolactone (ALDACTONE) 25 MG tablet Take 0.5 tablets (12.5 mg total) by mouth daily. 45 tablet 3  . valsartan (DIOVAN) 320 MG tablet TAKE 1 TABLET DAILY        (DISCONTINUE IRBESARTAN DUETO BACK ORDER) 90 tablet 3  . vitamin B-12 (CYANOCOBALAMIN) 1000 MCG tablet Take 1,000 mcg by mouth every evening.    Alveda Reasons 20 MG TABS tablet TAKE 1 TABLET DAILY WITH   SUPPER 90 tablet 1    Drug Regimen Review Drug regimen was reviewed and remains appropriate with no significant issues identified  Home: Home Living Family/patient expects to be discharged to:: Private residence Living Arrangements: Spouse/significant other Available Help at Discharge: Available 24 hours/day Type of Home: House Home Access: Stairs to enter [employer] of Steps: 3 Entrance Stairs-Rails: Right,Left Home Layout: Two level Alternate Level Stairs-Number of Steps: 13 steps (Has Stair lift) Bathroom Shower/Tub: Multimedia programmer: Handicapped height Bathroom Accessibility: Yes Home Equipment: Environmental consultant - 2 wheels,Wheelchair - manual,Shower seat - built in,Grab bars - tub/shower,Grab bars - toilet,Bedside commode,Cane - single point,Walker - 4 wheels   Functional History: Prior Function Level of Independence: Independent with assistive device(s),Needs assistance (wife "watches") Gait / Transfers Assistance Needed: recent fall; states because of bad knee ADL's / Homemaking Assistance Needed: up until  fracture he was completing his ADLs independently; wife has been Orthoptist / Swallowing Assistance Needed: speech different from baseline  Functional Status:  Mobility: Bed Mobility Overal bed mobility: Needs Assistance Bed Mobility: Rolling,Sidelying to Sit Rolling: Min assist Sidelying to sit: Min assist,HOB elevated Supine to sit: Mod assist,HOB elevated Sit to supine: Mod assist General bed mobility comments: Pt was received sitting up in the recliner. Transfers Overall transfer level: Needs assistance Equipment used: Rolling walker (2 wheeled) Transfer via Lift Equipment: Stedy Transfers: Sit to/from [employer] to Stand: Mod assist Stand pivot transfers: Max assist,+2 physical assistance Squat pivot transfers: Mod assist General transfer comment: moda to stand from recliner, pt required seated rest break after standing at sink level, unable to progress into standing from lower surface, required modA for squat pivot to return to recliner Ambulation/Gait Ambulation/Gait assistance: Min assist,+2 safety/equipment Gait Distance (Feet): 75 Feet Assistive device: Rolling walker (2 wheeled) Gait Pattern/deviations: Step-to pattern,Step-through pattern,Decreased stance time - left,Decreased step length - right,Trunk flexed General Gait Details: Slow, mildly unsteady gait with left knee instability and cues for RW proximity/management. 1 seated rest break. Gait velocity: decreased Gait velocity interpretation: <1.31 ft/sec, indicative of household ambulator    ADL: ADL Overall ADL's : Needs assistance/impaired  Eating/Feeding: Set up Grooming: Maximal assistance,Standing Grooming Details (indicate cue type and reason): pt with left lateral lean while washing hands at sink, required maxA to stabilize Upper Body Bathing: Minimal assistance,Sitting Lower Body Bathing: Maximal assistance,Sit to/from stand Upper Body Dressing : Moderate  assistance,Sitting Lower Body Dressing: Maximal assistance,Sit to/from stand Toilet Transfer: Moderate assistance,RW Toilet Transfer Details (indicate cue type and reason): pt ambulated 10 feet with minimal assistance initially then requiring moderate assistance due to fatigue, LLE weakness Toileting- Clothing Manipulation and Hygiene: Total assistance Toileting - Clothing Manipulation Details (indicate cue type and reason): incontinenet of urine/BM; states he is unsure when he has to go Functional mobility during ADLs: Rolling walker,Moderate assistance General ADL Comments: ambulated short distance in room;decreased activity tolerance and endurance;required seat at sink level and then squat pivot to return to recliner  Cognition: Cognition Overall Cognitive Status: Within Functional Limits for tasks assessed Orientation Level: Oriented X4 Cognition Arousal/Alertness: Awake/alert Behavior During Therapy: WFL for tasks assessed/performed Overall Cognitive Status: Within Functional Limits for tasks assessed Area of Impairment: Memory,Safety/judgement Current Attention Level: Selective Memory: Decreased short-term memory Safety/Judgement: Decreased awareness of safety,Decreased awareness of deficits Awareness: Emergent Problem Solving: Slow processing General Comments: pt scored 0/28 on short blessed test, screener for cognitive limitations, pt's score indicates normal cognitive functioning  Physical Exam: Blood pressure (!) 149/83, pulse 78, temperature 98.1 F (36.7 C), temperature source Oral, resp. rate 18, height 5\' 9"  (1.753 m), weight 93.7 kg, SpO2 98 %. Physical Exam Neurological:     Comments: Patient is alert. No acute distress. Makes eye contact with examiner. Provides name and age. Follows simple commands.     Results for orders placed or performed during the hospital encounter of 08/05/20 (from the past 48 hour(s))  Basic metabolic panel     Status: Abnormal    Collection Time: 08/09/20 10:35 AM  Result Value Ref Range   Sodium 139 135 - 145 mmol/L   Potassium 3.9 3.5 - 5.1 mmol/L   Chloride 106 98 - 111 mmol/L   CO2 21 (L) 22 - 32 mmol/L   Glucose, Bld 119 (H) 70 - 99 mg/dL    Comment: Glucose reference range applies only to samples taken after fasting for at least 8 hours.   BUN 14 8 - 23 mg/dL   Creatinine, Ser 0.96 0.61 - 1.24 mg/dL   Calcium 9.2 8.9 - 10.3 mg/dL   GFR, Estimated >60 >60 mL/min    Comment: (NOTE) Calculated using the CKD-EPI Creatinine Equation (2021)    Anion gap 12 5 - 15    Comment: Performed at [facility] [address] [place] [address]  CBC     Status: None   Collection Time: 08/10/20  3:21 AM  Result Value Ref Range   WBC 9.1 4.0 - 10.5 K/uL   RBC 4.77 4.22 - 5.81 MIL/uL   Hemoglobin 14.4 13.0 - 17.0 g/dL   HCT 44.4 39.0 - 52.0 %   MCV 93.1 80.0 - 100.0 fL   MCH 30.2 26.0 - 34.0 pg   MCHC 32.4 30.0 - 36.0 g/dL   RDW 13.2 11.5 - 15.5 %   Platelets 179 150 - 400 K/uL   nRBC 0.0 0.0 - 0.2 %    Comment: Performed at [facility], [place] [address] [place], [place] [address]  Magnesium     Status: None   Collection Time: 08/10/20  3:21 AM  Result Value Ref Range   Magnesium 2.0 1.7 -  2.4 mg/dL    Comment: Performed at [facility], [place] [address], [place], [place] [address]  Phosphorus     Status: None   Collection Time: 08/10/20  3:21 AM  Result Value Ref Range   Phosphorus 3.7 2.5 - 4.6 mg/dL    Comment: Performed at [facility] [address], [place], [place] [address]  Basic metabolic panel     Status: Abnormal   Collection Time: 08/10/20  3:21 AM  Result Value Ref Range   Sodium 138 135 - 145 mmol/L   Potassium 4.3 3.5 - 5.1 mmol/L   Chloride 106 98 - 111 mmol/L   CO2 23 22 - 32 mmol/L   Glucose, Bld 113 (H) 70 - 99 mg/dL    Comment: Glucose reference range applies only to samples taken after fasting for at least 8 hours.   BUN 16 8 - 23 mg/dL   Creatinine,  Ser 0.96 0.61 - 1.24 mg/dL   Calcium 8.9 8.9 - 10.3 mg/dL   GFR, Estimated >60 >60 mL/min    Comment: (NOTE) Calculated using the CKD-EPI Creatinine Equation (2021)    Anion gap 9 5 - 15    Comment: Performed at [facility] [address], [place], [place] [address]   No results found.     Medical Problem List and Plan: 1. Left side weakness secondary to acute/subacute infarct periventricular white matter adjacent to the body of the right lateral ventricle likely from small vessel disease.  -patient may *** shower  -ELOS/Goals: *** 2.  Antithrombotics: -DVT/anticoagulation: SCDs  -antiplatelet therapy: Aspirin 325 mg daily and Xarelto 20 mg daily 3. Pain Management: Oxycodone as needed 4. Mood: 5 emotional support  -antipsychotic agents: N/A 5. Neuropsych: This patient is capable of making decisions on his own behalf. 6. Skin/Wound Care: Routine skin checks 7. Fluids/Electrolytes/Nutrition: Routine in and outs with follow-up chemistries 8. Atrial fibrillation. Cardiac rate controlled. Continue Xarelto 9. CAD with CABG 2012. No chest pain or shortness of breath 10. Recent right ankle nondisplaced fracture of right lateral malleolus as well as small avulsion fracture adjacent to the right medial malleolus. Cam boot. Weightbearing as tolerated 11. Hyperlipidemia. Lipitor 12. Permissive hypertension. Toprol currently on hold. Resume as needed 13. History of gout. Allopurinol 100 mg daily. Monitor for any gout flareup  ***  Cathlyn Parsons, PA-C 08/10/2020

## 2020-08-10 NOTE — Progress Notes (Signed)
Physical Therapy Treatment Patient Details Name: Shane Espinoza MRN: [id number] DOB: 04/11/1941 Today's Date: 08/10/2020    History of Present Illness 80 y.o. male right hand PMHx CAD s/p CABG x 4; CVA (2015); HTN; and afib (rivaroxaban) presenting with acute L-sided weakness. The patient was walking with his walker and then noticed acute left arm weakness, shaking and some leg weakness.MRI brain showed focus of restricted diffusion consistent with acute/subacute infarct in the periventricular white matter adjacent to the body of the right lateral ventricle    PT Comments    Patient progressing well towards PT goals. Reports feeling better today with no pain. Continues to require varying assist to stand from different surfaces (Mod-Min guard) with max cues and repetition for proper technique, anterior weight shift and hand placement. Improved ambulation distance with Min A and chair follow for safety due to left knee instability, but no buckling. HR up to 136 bpm with activity. Highly motivated to get to rehab and a great candidate, awaiting a bed. Worked on core stability/strengthening in chair and EOB, noted to have posterior lean. Will continue to follow.    Follow Up Recommendations  CIR;Supervision for mobility/OOB     Equipment Recommendations  None recommended by PT    Recommendations for Other Services       Precautions / Restrictions Precautions Precautions: Fall Precaution Comments: Rt [place] when OOB Required Braces or Orthoses: Other Brace Restrictions Weight Bearing Restrictions: Yes RLE Weight Bearing: Weight bearing as tolerated Other Position/Activity Restrictions: with [place]    Mobility  Bed Mobility Overal bed mobility: Needs Assistance Bed Mobility: Rolling;Sidelying to Sit Rolling: Min guard Sidelying to sit: Min assist;HOB elevated       General bed mobility comments: Able to get to EOB with Min A to steady once upright due to posterior lean.  Increased time/effort due to difficulty scooting LLE out  Transfers Overall transfer level: Needs assistance Equipment used: Rolling walker (2 wheeled) Transfers: Sit to/from Stand Sit to Stand: Mod assist;Min assist;Min guard         General transfer comment: Initially mod A to stand from EOB wtih cues for hand placement, use of momentum and cues for anterior weight shift as pt with retropulsion at times. Progressed to Min A and Min guard for a few reps from chair with cues for technique. Performed STS from chair x5 with focus on slow descent and proper technique to rise pushing through UEs.  Ambulation/Gait Ambulation/Gait assistance: Min assist;+2 safety/equipment Gait Distance (Feet): 100 Feet Assistive device: Rolling walker (2 wheeled) Gait Pattern/deviations: Step-to pattern;Step-through pattern;Decreased stance time - left;Decreased step length - right;Trunk flexed Gait velocity: decreased Gait velocity interpretation: <1.31 ft/sec, indicative of household ambulator General Gait Details: Slow, mildly unsteady gait with left knee instability and cues for RW proximity/management and for forward gaze.   Stairs             Wheelchair Mobility    Modified Rankin (Stroke Patients Only) Modified Rankin (Stroke Patients Only) Pre-Morbid Rankin Score: Moderately severe disability Modified Rankin: Moderately severe disability     Balance Overall balance assessment: Needs assistance;History of Falls Sitting-balance support: Feet supported;Single extremity supported Sitting balance-Leahy Scale: Poor Sitting balance - Comments: Requires UE support sitting EOB, posterior lOB with dynamic activities EOB. Postural control: Posterior lean Standing balance support: During functional activity Standing balance-Leahy Scale: Poor Standing balance comment: Requires UE support in standing and cues for upright, posterior tendency.  Cognition  Arousal/Alertness: Awake/alert Behavior During Therapy: WFL for tasks assessed/performed Overall Cognitive Status: Within Functional Limits for tasks assessed                                 General Comments: Needs repetition at times to follow commands.      Exercises      General Comments General comments (skin integrity, edema, etc.): Wife present and helpful with chair follow during session.      Pertinent Vitals/Pain Pain Assessment: No/denies pain    Home Living                      Prior Function            PT Goals (current goals can now be found in the care plan section) Progress towards PT goals: Progressing toward goals    Frequency    Min 4X/week      PT Plan Current plan remains appropriate    Co-evaluation              AM-PAC PT "6 Clicks" Mobility   Outcome Measure  Help needed turning from your back to your side while in a flat bed without using bedrails?: A Little Help needed moving from lying on your back to sitting on the side of a flat bed without using bedrails?: A Lot Help needed moving to and from a bed to a chair (including a wheelchair)?: A Little Help needed standing up from a chair using your arms (e.g., wheelchair or bedside chair)?: A Lot Help needed to walk in hospital room?: A Little Help needed climbing 3-5 steps with a railing? : A Lot 6 Click Score: 15    End of Session Equipment Utilized During Treatment: Gait belt Activity Tolerance: Patient tolerated treatment well Patient left: in chair;with call bell/phone within reach;with chair alarm set;with family/visitor present Nurse Communication: Mobility status PT Visit Diagnosis: Hemiplegia and hemiparesis;Unsteadiness on feet (R26.81) Hemiplegia - Right/Left: Left Hemiplegia - dominant/non-dominant: Non-dominant Hemiplegia - caused by: Cerebral infarction     Time: 5631-4970 PT Time Calculation (min) (ACUTE ONLY): 40 min  Charges:  $Gait  Training: 23-37 mins $Therapeutic Activity: 8-22 mins                     Marisa Severin, PT, DPT Acute Rehabilitation Services Pager [phone] Office [place] 08/10/2020, 9:23 AM

## 2020-08-10 NOTE — Evaluation (Signed)
Speech Language Pathology Evaluation Patient Details Name: Shane Espinoza MRN: [id number] DOB: [birth year] Today's Date: 08/10/2020 Time: 4917-9150 SLP Time Calculation (min) (ACUTE ONLY): 22 min  Problem List:  Patient Active Problem List   Diagnosis Date Noted  . Acute CVA (cerebrovascular accident) ([place]) 08/06/2020  . TIA (transient ischemic attack) 08/05/2020  . Gout 12/07/2013  . Atrial fibrillation ([place]) 12/07/2013  . Acute thrombotic stroke ([place]) 09/23/2013  . CVA (cerebral infarction) 09/23/2013  . HTN (hypertension) 04/16/2013  . Hyperlipidemia with target LDL less than 70 04/16/2013  . CAD (coronary artery disease)   . Anxiety   . Hypertension   . S/P CABG x 4    Past Medical History:  Past Medical History:  Diagnosis Date  . Anxiety   . Atrial fibrillation ([employer])   . CAD (coronary artery disease)    [name]  . Gout   . Hypertension   . 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 CARDIAC CATHETERIZATION  03/02/2011   Recommended CABG  . COLON SURGERY    . CORONARY ARTERY BYPASS GRAFT  03/07/2011   DR.VAN  TRIGHT  . EYE SURGERY    . LEXISCAN MYOVIEW  02/20/2012   EKG negative for ischemia, noraml study  . TRANSTHORACIC ECHOCARDIOGRAM  02/20/2012   EF 50-55%, mild-moderate concentric LVH, LA moderately dilated, moderate mitral regurg  . VASCULAR DOPPLER  03/03/2011   Nosignificant extracranial carotid artery stenosis   HPI:  80 y.o. male right hand PMHx CAD s/p CABG x 4; CVA (2015); HTN; and afib (rivaroxaban) presenting with acute L-sided weakness. The patient was walking with his walker and then noticed acute left arm weakness, shaking and some leg weakness.MRI brain showed focus of restricted diffusion consistent with acute/subacute infarct in the periventricular white matter adjacent to the body of the right lateral ventricle   Assessment / Plan / Recommendation Clinical  Impression  Pt presents with fluent, clear speech; good short-term and prospective memory; normal attention.  + basic problem solving, orientation, pragmatics. Follows multi-step commands. Oral reading WNL.  No acute SLP f/u is indicated.  Recommend SLP assessment on CIR to evaluate higher level cognitive processes/executive functioning related to managing finances.  Pt/wife agree with plan.  Acute SLP service will sign off.    SLP Assessment  SLP Recommendation/Assessment: All further Speech Lanaguage Pathology  needs can be addressed in the next venue of care SLP Visit Diagnosis: Cognitive communication deficit (R41.841)    Follow Up Recommendations  Inpatient Rehab    Frequency and Duration           SLP Evaluation Cognition  Overall Cognitive Status: Within Functional Limits for tasks assessed Arousal/Alertness: Awake/alert Orientation Level: Oriented X4 Attention: Selective Selective Attention: Appears intact Memory: Appears intact Awareness: Appears intact Problem Solving: Appears intact Safety/Judgment: Appears intact       Comprehension  Auditory Comprehension Overall Auditory Comprehension: Appears within functional limits for tasks assessed Reading Comprehension Reading Status: Within funtional limits    Expression Expression Primary Mode of Expression: Verbal Verbal Expression Overall Verbal Expression: Appears within functional limits for tasks assessed Written Expression Dominant Hand: Right   Oral / Motor  Oral Motor/Sensory Function Overall Oral Motor/Sensory Function: Within functional limits Motor Speech Overall Motor Speech: Appears within functional limits for tasks assessed   GO  Shane Espinoza 08/10/2020, 10:37 AM   [address]. Tivis Ringer, [place] Office number [phone] Pager [phone]

## 2020-08-10 NOTE — Progress Notes (Signed)
Inpatient Rehab Admissions Coordinator:   I do not have a CIR bed for patient today. I am hopeful that I can get him in later this week.  I will continue to follow for potential admit pending bed availability.   Clemens Catholic, [place], [place] Admissions Coordinator  [phone] ([place]) [phone] (office)

## 2020-08-11 ENCOUNTER — Encounter (HOSPITAL_COMMUNITY): Payer: Self-pay | Admitting: Physical Medicine & Rehabilitation

## 2020-08-11 ENCOUNTER — Inpatient Hospital Stay (HOSPITAL_COMMUNITY)
Admission: RE | Admit: 2020-08-11 | Discharge: 2020-08-26 | DRG: 057 | Disposition: A | Discharge status: Home / Self Care | Payer: Medicare HMO | Source: Intra-hospital | Attending: Physical Medicine & Rehabilitation | Admitting: Physical Medicine & Rehabilitation

## 2020-08-11 ENCOUNTER — Other Ambulatory Visit: Payer: Self-pay

## 2020-08-11 ENCOUNTER — Encounter (HOSPITAL_COMMUNITY): Payer: Self-pay | Admitting: Internal Medicine

## 2020-08-11 DIAGNOSIS — Z8249 Family history of ischemic heart disease and other diseases of the circulatory system: Secondary | ICD-10-CM | POA: Diagnosis not present

## 2020-08-11 DIAGNOSIS — Z7901 Long term (current) use of anticoagulants: Secondary | ICD-10-CM

## 2020-08-11 DIAGNOSIS — S82891A Other fracture of right lower leg, initial encounter for closed fracture: Secondary | ICD-10-CM

## 2020-08-11 DIAGNOSIS — E538 Deficiency of other specified B group vitamins: Secondary | ICD-10-CM | POA: Diagnosis present

## 2020-08-11 DIAGNOSIS — I639 Cerebral infarction, unspecified: Secondary | ICD-10-CM | POA: Diagnosis not present

## 2020-08-11 DIAGNOSIS — I10 Essential (primary) hypertension: Secondary | ICD-10-CM | POA: Diagnosis not present

## 2020-08-11 DIAGNOSIS — Z6830 Body mass index (BMI) 30.0-30.9, adult: Secondary | ICD-10-CM

## 2020-08-11 DIAGNOSIS — I6309 Cerebral infarction due to thrombosis of other precerebral artery: Secondary | ICD-10-CM | POA: Diagnosis not present

## 2020-08-11 DIAGNOSIS — I4891 Unspecified atrial fibrillation: Secondary | ICD-10-CM | POA: Diagnosis not present

## 2020-08-11 DIAGNOSIS — Z713 Dietary counseling and surveillance: Secondary | ICD-10-CM | POA: Diagnosis not present

## 2020-08-11 DIAGNOSIS — S82831A Other fracture of upper and lower end of right fibula, initial encounter for closed fracture: Secondary | ICD-10-CM | POA: Diagnosis not present

## 2020-08-11 DIAGNOSIS — Z823 Family history of stroke: Secondary | ICD-10-CM

## 2020-08-11 DIAGNOSIS — M109 Gout, unspecified: Secondary | ICD-10-CM | POA: Diagnosis present

## 2020-08-11 DIAGNOSIS — E669 Obesity, unspecified: Secondary | ICD-10-CM | POA: Diagnosis not present

## 2020-08-11 DIAGNOSIS — E119 Type 2 diabetes mellitus without complications: Secondary | ICD-10-CM | POA: Diagnosis present

## 2020-08-11 DIAGNOSIS — I2510 Atherosclerotic heart disease of native coronary artery without angina pectoris: Secondary | ICD-10-CM | POA: Diagnosis present

## 2020-08-11 DIAGNOSIS — F419 Anxiety disorder, unspecified: Secondary | ICD-10-CM | POA: Diagnosis present

## 2020-08-11 DIAGNOSIS — Z951 Presence of aortocoronary bypass graft: Secondary | ICD-10-CM

## 2020-08-11 DIAGNOSIS — I63031 Cerebral infarction due to thrombosis of right carotid artery: Secondary | ICD-10-CM | POA: Diagnosis not present

## 2020-08-11 DIAGNOSIS — Z79899 Other long term (current) drug therapy: Secondary | ICD-10-CM

## 2020-08-11 DIAGNOSIS — X58XXXD Exposure to other specified factors, subsequent encounter: Secondary | ICD-10-CM | POA: Diagnosis present

## 2020-08-11 DIAGNOSIS — I69354 Hemiplegia and hemiparesis following cerebral infarction affecting left non-dominant side: Secondary | ICD-10-CM | POA: Diagnosis not present

## 2020-08-11 DIAGNOSIS — I63231 Cerebral infarction due to unspecified occlusion or stenosis of right carotid arteries: Secondary | ICD-10-CM

## 2020-08-11 DIAGNOSIS — E1169 Type 2 diabetes mellitus with other specified complication: Secondary | ICD-10-CM | POA: Diagnosis not present

## 2020-08-11 DIAGNOSIS — E782 Mixed hyperlipidemia: Secondary | ICD-10-CM | POA: Diagnosis not present

## 2020-08-11 DIAGNOSIS — M1712 Unilateral primary osteoarthritis, left knee: Secondary | ICD-10-CM | POA: Diagnosis present

## 2020-08-11 DIAGNOSIS — K5900 Constipation, unspecified: Secondary | ICD-10-CM | POA: Diagnosis not present

## 2020-08-11 DIAGNOSIS — I340 Nonrheumatic mitral (valve) insufficiency: Secondary | ICD-10-CM | POA: Diagnosis present

## 2020-08-11 DIAGNOSIS — I480 Paroxysmal atrial fibrillation: Secondary | ICD-10-CM | POA: Diagnosis present

## 2020-08-11 DIAGNOSIS — G4700 Insomnia, unspecified: Secondary | ICD-10-CM | POA: Diagnosis present

## 2020-08-11 DIAGNOSIS — E1122 Type 2 diabetes mellitus with diabetic chronic kidney disease: Secondary | ICD-10-CM

## 2020-08-11 DIAGNOSIS — S82841D Displaced bimalleolar fracture of right lower leg, subsequent encounter for closed fracture with routine healing: Secondary | ICD-10-CM

## 2020-08-11 DIAGNOSIS — S82891S Other fracture of right lower leg, sequela: Secondary | ICD-10-CM | POA: Diagnosis not present

## 2020-08-11 DIAGNOSIS — M25561 Pain in right knee: Secondary | ICD-10-CM | POA: Diagnosis not present

## 2020-08-11 DIAGNOSIS — R69 Illness, unspecified: Secondary | ICD-10-CM | POA: Diagnosis not present

## 2020-08-11 DIAGNOSIS — M25569 Pain in unspecified knee: Secondary | ICD-10-CM

## 2020-08-11 DIAGNOSIS — I6350 Cerebral infarction due to unspecified occlusion or stenosis of unspecified cerebral artery: Secondary | ICD-10-CM | POA: Diagnosis not present

## 2020-08-11 DIAGNOSIS — N471 Phimosis: Secondary | ICD-10-CM | POA: Diagnosis present

## 2020-08-11 DIAGNOSIS — Z09 Encounter for follow-up examination after completed treatment for conditions other than malignant neoplasm: Secondary | ICD-10-CM

## 2020-08-11 LAB — GLUCOSE, CAPILLARY
Glucose-Capillary: 101 mg/dL — ABNORMAL HIGH (ref 70–99)
Glucose-Capillary: 129 mg/dL — ABNORMAL HIGH (ref 70–99)

## 2020-08-11 MED ORDER — INSULIN ASPART 100 UNIT/ML ~~LOC~~ SOLN
0.0000 [IU] | Freq: Three times a day (TID) | SUBCUTANEOUS | Status: DC
  Administered 2020-08-12 – 2020-08-15 (×5): 1 [IU] via SUBCUTANEOUS

## 2020-08-11 MED ORDER — METOPROLOL SUCCINATE ER 25 MG PO TB24
25.0000 mg | ORAL_TABLET | Freq: Every day | ORAL | Status: DC
  Administered 2020-08-11 – 2020-08-26 (×16): 25 mg via ORAL
  Filled 2020-08-11 (×16): qty 1

## 2020-08-11 MED ORDER — ATORVASTATIN CALCIUM 40 MG PO TABS
40.0000 mg | ORAL_TABLET | Freq: Every day | ORAL | Status: DC
  Administered 2020-08-11 – 2020-08-25 (×15): 40 mg via ORAL
  Filled 2020-08-11 (×15): qty 1

## 2020-08-11 MED ORDER — ATORVASTATIN CALCIUM 40 MG PO TABS: 40.0000 mg | ORAL_TABLET | Freq: Every day | ORAL | Status: DC

## 2020-08-11 MED ORDER — PROCHLORPERAZINE 25 MG RE SUPP: 12.5000 mg | Freq: Four times a day (QID) | RECTAL | Status: DC | PRN

## 2020-08-11 MED ORDER — VITAMIN B-12 1000 MCG PO TABS
1000.0000 ug | ORAL_TABLET | Freq: Every day | ORAL | Status: DC
  Administered 2020-08-12 – 2020-08-26 (×15): 1000 ug via ORAL
  Filled 2020-08-11 (×15): qty 1

## 2020-08-11 MED ORDER — PROSIGHT PO TABS
1.0000 | ORAL_TABLET | Freq: Every day | ORAL | Status: DC
  Administered 2020-08-12 – 2020-08-26 (×15): 1 via ORAL
  Filled 2020-08-11 (×15): qty 1

## 2020-08-11 MED ORDER — ALLOPURINOL 100 MG PO TABS
100.0000 mg | ORAL_TABLET | Freq: Every day | ORAL | Status: DC
  Administered 2020-08-12 – 2020-08-26 (×15): 100 mg via ORAL
  Filled 2020-08-11 (×16): qty 1

## 2020-08-11 MED ORDER — ACETAMINOPHEN 325 MG PO TABS
325.0000 mg | ORAL_TABLET | ORAL | Status: DC | PRN
  Administered 2020-08-17 – 2020-08-25 (×9): 650 mg via ORAL
  Filled 2020-08-11 (×10): qty 2

## 2020-08-11 MED ORDER — RIVAROXABAN 20 MG PO TABS
20.0000 mg | ORAL_TABLET | Freq: Every day | ORAL | Status: DC
  Administered 2020-08-11 – 2020-08-25 (×15): 20 mg via ORAL
  Filled 2020-08-11 (×15): qty 1

## 2020-08-11 MED ORDER — INSULIN ASPART 100 UNIT/ML ~~LOC~~ SOLN: 0.0000 [IU] | Freq: Every day | SUBCUTANEOUS | Status: DC

## 2020-08-11 MED ORDER — PROCHLORPERAZINE EDISYLATE 10 MG/2ML IJ SOLN: 5.0000 mg | Freq: Four times a day (QID) | INTRAMUSCULAR | Status: DC | PRN

## 2020-08-11 MED ORDER — VITAMIN D 25 MCG (1000 UNIT) PO TABS
5000.0000 [IU] | ORAL_TABLET | ORAL | Status: DC
  Administered 2020-08-13 – 2020-08-25 (×6): 5000 [IU] via ORAL
  Filled 2020-08-11 (×7): qty 5

## 2020-08-11 MED ORDER — POLYETHYLENE GLYCOL 3350 17 G PO PACK: 17.0000 g | PACK | Freq: Every day | ORAL | Status: DC | PRN

## 2020-08-11 MED ORDER — PROCHLORPERAZINE MALEATE 5 MG PO TABS: 5.0000 mg | ORAL_TABLET | Freq: Four times a day (QID) | ORAL | Status: DC | PRN

## 2020-08-11 MED ORDER — FLEET ENEMA 7-19 GM/118ML RE ENEM: 1.0000 | ENEMA | Freq: Once | RECTAL | Status: DC | PRN

## 2020-08-11 MED ORDER — OXYCODONE-ACETAMINOPHEN 5-325 MG PO TABS: 1.0000 | ORAL_TABLET | Freq: Four times a day (QID) | ORAL | Status: DC | PRN

## 2020-08-11 MED ORDER — TRAZODONE HCL 50 MG PO TABS
25.0000 mg | ORAL_TABLET | Freq: Every evening | ORAL | Status: DC | PRN
  Filled 2020-08-11 (×2): qty 1

## 2020-08-11 MED ORDER — ALUM & MAG HYDROXIDE-SIMETH 200-200-20 MG/5ML PO SUSP: 30.0000 mL | ORAL | Status: DC | PRN

## 2020-08-11 MED ORDER — DIPHENHYDRAMINE HCL 12.5 MG/5ML PO ELIX
12.5000 mg | ORAL_SOLUTION | Freq: Four times a day (QID) | ORAL | Status: DC | PRN
  Administered 2020-08-17: 25 mg via ORAL
  Filled 2020-08-11: qty 10

## 2020-08-11 MED ORDER — GUAIFENESIN-DM 100-10 MG/5ML PO SYRP: 5.0000 mL | ORAL_SOLUTION | Freq: Four times a day (QID) | ORAL | Status: DC | PRN

## 2020-08-11 MED ORDER — BISACODYL 10 MG RE SUPP: 10.0000 mg | Freq: Every day | RECTAL | Status: DC | PRN

## 2020-08-11 NOTE — Discharge Summary (Addendum)
Physician Discharge Summary  Hy Swiatek [facility] ZOX:[id number] DOB: [birth year] DOA: 08/05/2020  PCP: Josetta Huddle, MD  Admit date: 08/05/2020 Discharge date: 08/11/2020  Admitted From: Home Discharge disposition: CIR   Recommendations for Outpatient Follow-Up:   1. Follow with Dr. Stann Mainland for instructions regarding repeating x-ray and advancement of mobility   Discharge Diagnosis:   Active Problems:   Hypertension   S/P CABG x 4   Hyperlipidemia with target LDL less than 70   Atrial fibrillation (HCC)   Acute CVA (cerebrovascular accident) [facility])    Discharge Condition: Improved.  Diet recommendation: Low sodium, heart healthy.    Wound care: None.  Code status: Full.   History of Present Illness:   Shane Espinoza is a 80 y.o. male with medical history significant of CAD s/p CABG x 4; CVA (2015); HTN; and afib on Xarelto presenting with L-sided weakness. About 4 weeks ago, he injured his R ankle - fracture, no weight bearing.  He saw the doctor 2 days ago and was allowed to start weight bearing.  He is concerned that he did too much.  His left side gave way and he almost colalpased with L sided weakness of arm and leg.  He had some light-headedness as well.  He still feels a "presence" in his left arm but it is better, leg symptoms are resolved.  No dysarthria this time.  No dysphagia.   Hospital Course by Problem:   CVA -Patient presentedwith acute onset of L-sided weakness. -Neurology was consulted recommended stroke work-up. -MRI brain revealed acute and subacute infarcts. -Carotid doppler  Not significant stenosis. -MRA head is nonrevealing. MRA neck result is noted.  -Echo revealed EF of 55 to 60%, mild MR and mild to moderate aortic sclerosis/calcification without aortic stenosis. -HbA1c 6.5%. -Patient is awaiting CIR -Neurology consult appreciated: Continue Xarelto but patient needs to take with his largest meal of the day  HTN -Resume  blood pressure meds as needed starting with rate controlling meds: metoprolol  HLD -LDL is 45. -Total cholesterol is 101. -Continue statin  CAD s/p CABG - Remote CABG x 4v - Denies chest pain. - Continue all cardio protective meds.  persistent Afib -Rate controlled with Toprol -Continue Xarelto--Long discussion with patient regarding need to take Xarelto with his largest meal of the day (does not have to be dinner as he does not eat a large dinner)  Recent ankle fracture -Follows up with Dr. Stann Mainland -Due to have x-ray of ankle next week -Continue boot    Medical Consultants:    Neurology  Discharge Exam:   Vitals:   08/11/20 0048 08/11/20 0432  BP: 133/83 133/74  Pulse: 64 71  Resp: 18 18  Temp: 98.3 F (36.8 C) 97.8 F (36.6 C)  SpO2: 97% 95%   Vitals:   08/10/20 1202 08/10/20 1729 08/11/20 0048 08/11/20 0432  BP: 123/81 (!) 133/53 133/83 133/74  Pulse: 79 68 64 71  Resp: 18 17 18 18   Temp: 98.1 F (36.7 C) 98.1 F (36.7 C) 98.3 F (36.8 C) 97.8 F (36.6 C)  TempSrc: Oral Oral Oral Oral  SpO2: 99% 98% 97% 95%  Weight:      Height:        General exam: Appears calm and comfortable.     The results of significant diagnostics from this hospitalization (including imaging, microbiology, ancillary and laboratory) are listed below for reference.     Procedures and Diagnostic Studies:   CT HEAD WO CONTRAST  Result Date: 08/05/2020 CLINICAL DATA:  Transient ischemic attack, episode of LEFT-sided weakness and dizziness today lasting 1 hour, symptoms resolved EXAM: CT HEAD WITHOUT CONTRAST TECHNIQUE: Contiguous axial images were obtained from the base of the skull through the vertex without intravenous contrast. Sagittal and coronal MPR images reconstructed from axial data set. COMPARISON:  09/23/2013 FINDINGS: Brain: Generalized atrophy. Normal ventricular morphology. No midline shift or mass effect. Small vessel chronic ischemic changes of deep cerebral  white matter. No intracranial hemorrhage, mass lesion, evidence of acute infarction, or extra-axial fluid collection. Vascular: Atherosclerotic calcification of internal carotid and vertebral arteries at skull base Skull: Intact Sinuses/Orbits: Scattered mucosal thickening in ethmoid air cells and sphenoid sinus. Minimal fluid RIGHT maxillary sinus. Other: N/A IMPRESSION: Atrophy with small vessel chronic ischemic changes of deep cerebral white matter. No acute intracranial abnormalities. Electronically Signed   By: Lavonia Dana M.D.   On: 08/05/2020 11:19   MR ANGIO HEAD WO CONTRAST  Result Date: 08/05/2020 CLINICAL DATA:  Transient ischemic attack. EXAM: MRA HEAD WITHOUT CONTRAST TECHNIQUE: Angiographic images of the Circle of Willis were obtained using MRA technique without intravenous contrast. COMPARISON:  None. FINDINGS: The visualized portions of the distal cervical and intracranial internal carotid arteries are widely patent with normal flow related enhancement. The bilateral anterior cerebral arteries and middle cerebral arteries are widely patent with antegrade flow without high-grade flow-limiting stenosis or proximal branch occlusion. No intracranial aneurysm within the anterior circulation. The vertebral arteries are widely patent with antegrade flow. Vertebrobasilar junction and basilar artery are widely patent with antegrade flow without evidence of basilar stenosis or aneurysm. Posterior cerebral arteries are normal bilaterally. No intracranial aneurysm within the posterior circulation. IMPRESSION: Normal intracranial MRA. Electronically Signed   By: Pedro Earls M.D.   On: 08/05/2020 16:45   MR Angiogram Neck W or Wo Contrast  Result Date: 08/05/2020 CLINICAL DATA:  Stroke EXAM: MRA NECK WITHOUT AND WITH CONTRAST TECHNIQUE: Multiplanar and multiecho pulse sequences of the neck were obtained without and with intravenous contrast. Angiographic images of the neck were obtained  using MRA technique without and with intravenous contrast. CONTRAST:  9.24mL GADAVIST GADOBUTROL 1 MMOL/ML IV SOLN COMPARISON:  MRI head 08/05/2020 FINDINGS: Image quality degraded by extensive motion. In addition, there is suboptimal arterial enhancement Decreased signal at the carotid bifurcation bilaterally consistent with stenosis which is difficult to quantify. Possible severe stenosis at the left carotid bifurcation. Both vertebral arteries appear patent with antegrade flow. IMPRESSION: Examination limited by motion and suboptimal arterial opacification Probable bilateral carotid bifurcation stenosis left greater than right. Correlate with CTA or Doppler. Electronically Signed   By: Franchot Gallo M.D.   On: 08/05/2020 17:02   MR BRAIN WO CONTRAST  Result Date: 08/05/2020 CLINICAL DATA:  Transient ischemic attack. EXAM: MRI HEAD WITHOUT CONTRAST TECHNIQUE: Multiplanar, multiecho pulse sequences of the brain and surrounding structures were obtained without intravenous contrast. COMPARISON:  Head CT July 05, 2020. FINDINGS: Brain: Focus of restricted diffusion consistent with acute/subacute infarct in the periventricular white matter adjacent to the body of the right lateral ventricle. No hemorrhage, hydrocephalus, extra-axial collection or mass lesion. Scattered foci of T2 hyperintensity are seen in the white matter of the cerebral hemispheres, nonspecific. Moderate parenchymal volume loss. Vascular: Normal flow voids. Skull and upper cervical spine: Normal marrow signal. Sinuses/Orbits: Mucosal thickening throughout the paranasal sinuses with fluid level within the left sphenoid sinus. Right lens surgery. Other: Mild bilateral mastoid effusions, right greater than left. IMPRESSION: 1. Focus of  restricted diffusion consistent with acute/subacute infarct in the periventricular white matter adjacent to the body of the right lateral ventricle. 2. Scattered foci of T2 hyperintensity in the white matter of the  cerebral hemispheres, nonspecific but may represent chronic microvascular ischemic changes. 3. Moderate parenchymal volume loss. 4. Paranasal sinus disease with fluid level within the left sphenoid sinus. Correlate for acute sinusitis. 5. Mild bilateral mastoid effusions, right greater than left. These results were called by telephone at the time of interpretation on 08/05/2020 at 4:16 pm to Dr. Thomasenia Bottoms, Who verbally acknowledged these results. Electronically Signed   By: Pedro Earls M.D.   On: 08/05/2020 16:18   ECHOCARDIOGRAM COMPLETE  Result Date: 08/06/2020    ECHOCARDIOGRAM REPORT   Patient Name:   Shane Espinoza Date of Exam: 08/06/2020 Medical Rec #:  [id number]             Height:       69.0 in Accession #:    [id number]            Weight:       206.6 lb Date of Birth:  18-Oct-1940             BSA:          2.095 m Patient Age:    49 years              BP:           113/79 mmHg Patient Gender: M                     HR:           76 bpm. Exam Location:  Inpatient Procedure: 2D Echo, Cardiac Doppler and Color Doppler Indications:    TIA 435.9 / G45.9  History:        Patient has prior history of Echocardiogram examinations, most                 recent 04/27/2020. CAD, Prior CABG, Arrythmias:Atrial                 Fibrillation; Risk Factors:Hypertension and Non-Smoker.  Sonographer:    Vickie Epley RDCS Referring Phys: [place]  1. Left ventricular ejection fraction, by estimation, is 55 to 60%. The left ventricle has normal function. The left ventricle has no regional wall motion abnormalities. Left ventricular diastolic parameters are consistent with Grade I diastolic dysfunction (impaired relaxation).  2. Right ventricular systolic function is normal. The right ventricular size is normal. There is normal pulmonary artery systolic pressure.  3. Left atrial size was mildly dilated.  4. Right atrial size was mildly dilated.  5. The mitral valve is normal in  structure. Mild mitral valve regurgitation. No evidence of mitral stenosis.  6. The aortic valve is normal in structure. Aortic valve regurgitation is not visualized. Mild to moderate aortic valve sclerosis/calcification is present, without any evidence of aortic stenosis.  7. The inferior vena cava is normal in size with greater than 50% respiratory variability, suggesting right atrial pressure of 3 mmHg. Conclusion(s)/Recommendation(s): No intracardiac source of embolism detected on this transthoracic study. A transesophageal echocardiogram is recommended to exclude cardiac source of embolism if clinically indicated. FINDINGS  Left Ventricle: Left ventricular ejection fraction, by estimation, is 55 to 60%. The left ventricle has normal function. The left ventricle has no regional wall motion abnormalities. The left ventricular internal cavity size was normal in size. There is  no  left ventricular hypertrophy. Left ventricular diastolic parameters are consistent with Grade I diastolic dysfunction (impaired relaxation). Right Ventricle: The right ventricular size is normal. No increase in right ventricular wall thickness. Right ventricular systolic function is normal. There is normal pulmonary artery systolic pressure. The tricuspid regurgitant velocity is 2.09 m/s, and  with an assumed right atrial pressure of 3 mmHg, the estimated right ventricular systolic pressure is 40.0 mmHg. Left Atrium: Left atrial size was mildly dilated. Right Atrium: Right atrial size was mildly dilated. Pericardium: There is no evidence of pericardial effusion. Mitral Valve: The mitral valve is normal in structure. There is mild thickening of the mitral valve leaflet(s). Mild mitral valve regurgitation. No evidence of mitral valve stenosis. Tricuspid Valve: The tricuspid valve is normal in structure. Tricuspid valve regurgitation is mild . No evidence of tricuspid stenosis. Aortic Valve: The aortic valve is normal in structure. Aortic  valve regurgitation is not visualized. Mild to moderate aortic valve sclerosis/calcification is present, without any evidence of aortic stenosis. Pulmonic Valve: The pulmonic valve was normal in structure. Pulmonic valve regurgitation is trivial. No evidence of pulmonic stenosis. Aorta: The aortic root is normal in size and structure. Venous: The inferior vena cava is normal in size with greater than 50% respiratory variability, suggesting right atrial pressure of 3 mmHg. IAS/Shunts: No atrial level shunt detected by color flow Doppler.  LEFT VENTRICLE PLAX 2D LVIDd:         5.80 cm LVIDs:         3.70 cm LV PW:         0.90 cm LV IVS:        0.90 cm LVOT diam:     2.30 cm LV SV:         59 LV SV Index:   28 LVOT Area:     4.15 cm  LV Volumes (MOD) LV vol d, MOD A2C: 115.0 ml LV vol d, MOD A4C: 93.7 ml LV vol s, MOD A2C: 59.8 ml LV vol s, MOD A4C: 51.0 ml LV SV MOD A2C:     55.2 ml LV SV MOD A4C:     93.7 ml LV SV MOD BP:      49.2 ml RIGHT VENTRICLE TAPSE (M-mode): 1.0 cm LEFT ATRIUM             Index       RIGHT ATRIUM           Index LA diam:        5.80 cm 2.77 cm/m  RA Area:     25.00 cm LA Vol (A2C):   73.3 ml 35.00 ml/m RA Volume:   72.40 ml  34.57 ml/m LA Vol (A4C):   80.1 ml 38.24 ml/m LA Biplane Vol: 80.9 ml 38.62 ml/m  AORTIC VALVE LVOT Vmax:   85.60 cm/s LVOT Vmean:  53.300 cm/s LVOT VTI:    0.142 m  AORTA Ao Root diam: 3.40 cm MR Peak grad: 106.5 mmHg  TRICUSPID VALVE MR Vmax:      516.00 cm/s TR Peak grad:   17.5 mmHg                           TR Vmax:        209.00 cm/s                            SHUNTS  Systemic VTI:  0.14 m                           Systemic Diam: 2.30 cm Candee Furbish MD Electronically signed by Candee Furbish MD Signature Date/Time: 08/06/2020/1:37:52 PM    Final    VAS US CAROTID (at [facility] and [facility] only)  Result Date: 08/06/2020 Carotid Arterial Duplex Study Indications:       TIA. Risk Factors:      Hypertension, coronary artery disease. Comparison Study:   no prior Performing Technologist: Abram Sander RVS  Examination Guidelines: A complete evaluation includes B-mode imaging, spectral Doppler, color Doppler, and power Doppler as needed of all accessible portions of each vessel. Bilateral testing is considered an integral part of a complete examination. Limited examinations for reoccurring indications may be performed as noted.  Right Carotid Findings: +----------+--------+--------+--------+------------------+--------+           PSV cm/sEDV cm/sStenosisPlaque DescriptionComments +----------+--------+--------+--------+------------------+--------+ CCA Prox  55      11              heterogenous               +----------+--------+--------+--------+------------------+--------+ CCA Distal37      7               heterogenous               +----------+--------+--------+--------+------------------+--------+ ICA Prox  47      14      1-39%   heterogenous               +----------+--------+--------+--------+------------------+--------+ ICA Distal90      21                                         +----------+--------+--------+--------+------------------+--------+ ECA       55      4                                          +----------+--------+--------+--------+------------------+--------+ +----------+--------+-------+--------+-------------------+           PSV cm/sEDV cmsDescribeArm Pressure (mmHG) +----------+--------+-------+--------+-------------------+ Subclavian70                                         +----------+--------+-------+--------+-------------------+ +---------+--------+--+--------+--+---------+ VertebralPSV cm/s54EDV cm/s10Antegrade +---------+--------+--+--------+--+---------+  Left Carotid Findings: +----------+--------+--------+--------+------------------+---------+           PSV cm/sEDV cm/sStenosisPlaque DescriptionComments   +----------+--------+--------+--------+------------------+---------+ CCA Prox  86      13              heterogenous                +----------+--------+--------+--------+------------------+---------+ CCA Distal43      7               heterogenous                +----------+--------+--------+--------+------------------+---------+ ICA Prox  67      24      1-39%   heterogenous      Shadowing +----------+--------+--------+--------+------------------+---------+ ICA Distal52      13                                          +----------+--------+--------+--------+------------------+---------+  ECA       64      9                                           +----------+--------+--------+--------+------------------+---------+ +----------+--------+--------+--------+-------------------+           PSV cm/sEDV cm/sDescribeArm Pressure (mmHG) +----------+--------+--------+--------+-------------------+ [id number]                                          +----------+--------+--------+--------+-------------------+ +---------+--------+--+--------+--+---------+ VertebralPSV cm/s40EDV cm/s12Antegrade +---------+--------+--+--------+--+---------+   Summary: Right Carotid: Velocities in the right ICA are consistent with a 1-39% stenosis. Left Carotid: Velocities in the left ICA are consistent with a 1-39% stenosis. Vertebrals: Bilateral vertebral arteries demonstrate antegrade flow. *See table(s) above for measurements and observations.  Electronically signed by Antony Contras MD on 08/06/2020 at 12:16:27 PM.    Final      Labs:   Basic Metabolic Panel: Recent Labs  Lab 08/05/20 1033 08/05/20 1058 08/09/20 1035 08/10/20 0321  NA 138 136 139 138  K 3.8 5.4* 3.9 4.3  CL 103 104 106 106  CO2 24  --  21* 23  GLUCOSE 132* 130* 119* 113*  BUN 17 28* 14 16  CREATININE 1.16 1.00 0.96 0.96  CALCIUM 9.3  --  9.2 8.9  MG  --   --   --  2.0  PHOS  --   --   --  3.7    GFR Estimated Creatinine Clearance: 69.4 mL/min (by C-G formula based on SCr of 0.96 mg/dL). Liver Function Tests: Recent Labs  Lab 08/05/20 1033  AST 29  ALT 22  ALKPHOS 74  BILITOT 1.3*  PROT 7.1  ALBUMIN 3.3*   No results for input(s): LIPASE, AMYLASE in the last 168 hours. No results for input(s): AMMONIA in the last 168 hours. Coagulation profile Recent Labs  Lab 08/05/20 1033  INR 2.2*    CBC: Recent Labs  Lab 08/05/20 1033 08/05/20 1058 08/10/20 0321  WBC 8.7  --  9.1  NEUTROABS 6.0  --   --   HGB 15.4 17.0 14.4  HCT 48.8 50.0 44.4  MCV 93.8  --  93.1  PLT 194  --  179   Cardiac Enzymes: No results for input(s): CKTOTAL, CKMB, CKMBINDEX, TROPONINI in the last 168 hours. BNP: Invalid input(s): POCBNP CBG: Recent Labs  Lab 08/10/20 1130  GLUCAP 138*   D-Dimer No results for input(s): DDIMER in the last 72 hours. Hgb A1c No results for input(s): HGBA1C in the last 72 hours. Lipid Profile No results for input(s): CHOL, HDL, LDLCALC, TRIG, CHOLHDL, LDLDIRECT in the last 72 hours. Thyroid function studies No results for input(s): TSH, T4TOTAL, T3FREE, THYROIDAB in the last 72 hours.  Invalid input(s): FREET3 Anemia work up No results for input(s): VITAMINB12, FOLATE, FERRITIN, TIBC, IRON, RETICCTPCT in the last 72 hours. Microbiology Recent Results (from the past 240 hour(s))  SARS CORONAVIRUS 2 (TAT 6-24 HRS) Nasopharyngeal Nasopharyngeal Swab     Status: None   Collection Time: 08/05/20  1:15 PM   Specimen: Nasopharyngeal Swab  Result Value Ref Range Status   SARS Coronavirus 2 NEGATIVE NEGATIVE Final    Comment: (NOTE) SARS-CoV-2 target nucleic acids are NOT DETECTED.  The SARS-CoV-2 RNA is generally detectable in upper and lower respiratory specimens  during the acute phase of infection. Negative results do not preclude SARS-CoV-2 infection, do not rule out co-infections with other pathogens, and should not be used as the sole basis for  treatment or other patient management decisions. Negative results must be combined with clinical observations, patient history, and epidemiological information. The expected result is Negative.  Fact Sheet for Patients: SugarRoll.be  Fact Sheet for Healthcare Providers: https://www.woods-mathews.com/  This test is not yet approved or cleared by the [place] FDA and  has been authorized for detection and/or diagnosis of SARS-CoV-2 by FDA under an Emergency Use Authorization (EUA). This EUA will remain  in effect (meaning this test can be used) for the duration of the COVID-19 declaration under Se ction 564(b)(1) of the Act, 21 U.S.C. section 360bbb-3(b)(1), unless the authorization is terminated or revoked sooner.  Performed at [facility], [place] [address] [place], [place] [address]      Discharge Instructions:   Discharge Instructions    Diet - low sodium heart healthy   Complete by: As directed    Increase activity slowly   Complete by: As directed    No wound care   Complete by: As directed      Allergies as of 08/11/2020   No Known Allergies     Medication List    STOP taking these medications   amLODipine 5 MG tablet Commonly known as: NORVASC   HYDROcodone-acetaminophen 5-325 MG tablet Commonly known as: NORCO/VICODIN   simvastatin 20 MG tablet Commonly known as: ZOCOR   spironolactone 25 MG tablet Commonly known as: ALDACTONE   valsartan 320 MG tablet Commonly known as: DIOVAN     TAKE these medications   acetaminophen 500 MG tablet Commonly known as: TYLENOL Take 500 mg by mouth every 6 (six) hours as needed for moderate pain.   Align 4 MG Caps Take 1 capsule by mouth every other day.   allopurinol 100 MG tablet Commonly known as: ZYLOPRIM Take 1 tablet by mouth daily.   atorvastatin 40 MG tablet Commonly known as: LIPITOR Take 1 tablet (40 mg total) by mouth at bedtime.   beta carotene  w/minerals tablet Take 1 tablet by mouth daily.   fish oil-omega-3 fatty acids 1000 MG capsule Take 1 g by mouth 3 (three) times a week.   furosemide 20 MG tablet Commonly known as: LASIX TAKE 1 TABLET DAILY, TAKE 1EXTRA TABLET AS NEEDED FOR SWELLING   metoprolol succinate 25 MG 24 hr tablet Commonly known as: TOPROL-XL Take 1 tablet (25 mg total) by mouth daily.   oxyCODONE-acetaminophen 5-325 MG tablet Commonly known as: PERCOCET/ROXICET Take 1 tablet by mouth every 6 (six) hours as needed for moderate pain or severe pain.   vitamin B-12 1000 MCG tablet Commonly known as: CYANOCOBALAMIN Take 1,000 mcg by mouth every evening.   Vitamin D3 125 MCG (5000 UT) Caps Take by mouth. Monday, Wednesday, Friday   Xarelto 20 MG Tabs tablet Generic drug: rivaroxaban TAKE 1 TABLET DAILY WITH   SUPPER         Time coordinating discharge: 35 min  Signed:  Geradine Girt DO  [facility] 08/11/2020, 10:10 AM

## 2020-08-11 NOTE — Progress Notes (Signed)
Inpatient Rehabilitation  Patient information reviewed and entered into eRehab system by Nevea Spiewak M. Jimmy Stipes, M.A., CCC/SLP, PPS Coordinator.  Information including medical coding, functional ability and quality indicators will be reviewed and updated through discharge.    

## 2020-08-11 NOTE — H&P (Signed)
Physical Medicine and Rehabilitation Admission H&P    Chief Complaint  Patient presents with  . Stroke with functional deficits    HPI: Shane Espinoza is an 80 year old male with history of CAF, CAD s/p CABG 2012, CVA 2015, A fib- on Xarelto,  fall 07/08/20 with nondisplaced right lateral malleolus and adjacent avulsion fracture of medial malleolus who developed left sided weakness and numbness with fall. UDS negative. LLE symptoms resolved and MRI head showed focus of restricted diffusion with acute/subacute infarct in periventricular white matter adjacent to right ventricle and incidental moderate parenchymal volume loss.  2D echo showed EF 55-60%, mild to moderate aortic valve sclerosis and grade 1 DD. Carotid dopplers were negative for significant ICA stenosis. Dr. Leonie Man felt that stroke was due to small vessel disease and educated patient on importance of taking medications at same time with the largest meal of the day. Patient continues to require CAM boot on RLE--WBAT and now has resultant LUE weakness with instability, left lateral lean and delayed processing with decrease STM affecting ADLs and mobility. CIR recommended given left sided weakness and right sided ankle fracture.   Review of Systems  Constitutional: Negative for chills and fever.  Eyes: Negative for blurred vision and double vision.  Respiratory: Negative for cough and shortness of breath.   Cardiovascular: Negative for chest pain and palpitations.  Gastrointestinal: Negative for constipation, heartburn and nausea.  Genitourinary: Positive for frequency.       Goes to the BR every 1.5 hours  Musculoskeletal: Positive for joint pain. Negative for myalgias.  Skin: Negative for rash.  Neurological: Positive for weakness. Negative for dizziness and headaches.  Psychiatric/Behavioral: The patient has insomnia. The patient is not nervous/anxious.      Past Medical History:  Diagnosis Date  . Anxiety   .  Atrial fibrillation ([place])   . Biceps tendon tear    right  . CAD (coronary artery disease)    [name]  . Gout   . Hypertension   . 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 CARDIAC CATHETERIZATION  03/02/2011   Recommended CABG  . COLON SURGERY    . CORONARY ARTERY BYPASS GRAFT  03/07/2011   DR.VAN  TRIGHT  . EYE SURGERY    . LEXISCAN MYOVIEW  02/20/2012   EKG negative for ischemia, noraml study  . TRANSTHORACIC ECHOCARDIOGRAM  02/20/2012   EF 50-55%, mild-moderate concentric LVH, LA moderately dilated, moderate mitral regurg  . VASCULAR DOPPLER  03/03/2011   Nosignificant extracranial carotid artery stenosis    Family History  Problem Relation Age of Onset  . CVA Father   . Heart attack Maternal Grandmother   . Heart attack Maternal Grandfather   . Heart murmur Sister   . Hypertension Son     Social History: Married. Independent PTA--was using a walker for the past month. He  reports that he has never smoked. He has never used smokeless tobacco. He reports that he does not drink alcohol and does not use drugs.    Allergies: No Known Allergies    Medications Prior to Admission  Medication Sig Dispense Refill  . acetaminophen (TYLENOL) 500 MG tablet Take 500 mg by mouth every 6 (six) hours as needed for moderate pain.    Marland Kitchen allopurinol (ZYLOPRIM) 100 MG tablet Take 1 tablet by mouth daily.  2  . atorvastatin (LIPITOR) 40 MG tablet Take 1 tablet (40  mg total) by mouth at bedtime.    . beta carotene w/minerals (OCUVITE) tablet Take 1 tablet by mouth daily.    . Cholecalciferol (VITAMIN D3) 5000 UNITS CAPS Take by mouth. Monday, Wednesday, Friday    . fish oil-omega-3 fatty acids 1000 MG capsule Take 1 g by mouth 3 (three) times a week.    . furosemide (LASIX) 20 MG tablet TAKE 1 TABLET DAILY, TAKE 1EXTRA TABLET AS NEEDED FOR SWELLING 180 tablet 3  . metoprolol succinate (TOPROL-XL) 25 MG 24 hr tablet Take 1 tablet (25 mg  total) by mouth daily. 90 tablet 3  . oxyCODONE-acetaminophen (PERCOCET/ROXICET) 5-325 MG tablet Take 1 tablet by mouth every 6 (six) hours as needed for moderate pain or severe pain.    . Probiotic Product (ALIGN) 4 MG CAPS Take 1 capsule by mouth every other day.    . vitamin B-12 (CYANOCOBALAMIN) 1000 MCG tablet Take 1,000 mcg by mouth every evening.    Alveda Reasons 20 MG TABS tablet TAKE 1 TABLET DAILY WITH   SUPPER 90 tablet 1    Drug Regimen Review  Drug regimen was reviewed and remains appropriate with no significant issues identified     Home: Home Living Family/patient expects to be discharged to:: Private residence Living Arrangements: Spouse/significant other Available Help at Discharge: Available 24 hours/day Type of Home: House Home Access: Stairs to enter [employer] of Steps: 3 Entrance Stairs-Rails: Right,Left Home Layout: Two level Alternate Level Stairs-Number of Steps: 13 steps (Has Stair lift) Bathroom Shower/Tub: Multimedia programmer: Handicapped height Bathroom Accessibility: Yes Home Equipment: Environmental consultant - 2 wheels,Wheelchair - manual,Shower seat - built in,Grab bars - tub/shower,Grab bars - toilet,Bedside commode,Cane - single point,Walker - 4 wheels  Lives With: Spouse   Functional History: Prior Function Level of Independence: Independent with assistive device(s),Needs assistance (wife "watches") Gait / Transfers Assistance Needed: recent fall; states because of bad knee ADL's / Homemaking Assistance Needed: up until fracture he was completing his ADLs independently; wife has been Orthoptist / Swallowing Assistance Needed: speech different from baseline  Functional Status:  Mobility: Bed Mobility Overal bed mobility: Needs Assistance Bed Mobility: Rolling,Sidelying to Sit Rolling: Min guard Sidelying to sit: Min assist,HOB elevated Supine to sit: Mod assist,HOB elevated Sit to supine: Mod assist General bed mobility  comments: Able to get to EOB with Min A to steady once upright due to posterior lean. Increased time/effort due to difficulty scooting LLE out Transfers Overall transfer level: Needs assistance Equipment used: Rolling walker (2 wheeled) Transfer via Lift Equipment: Stedy Transfers: Sit to/from Stand Sit to Stand: Mod assist,Min assist,Min guard Stand pivot transfers: Max assist,+2 physical assistance Squat pivot transfers: Mod assist General transfer comment: Initially mod A to stand from EOB wtih cues for hand placement, use of momentum and cues for anterior weight shift as pt with retropulsion at times. Progressed to Min A and Min guard for a few reps from chair with cues for technique. Performed STS from chair x5 with focus on slow descent and proper technique to rise pushing through UEs. Ambulation/Gait Ambulation/Gait assistance: Min assist,+2 safety/equipment Gait Distance (Feet): 100 Feet Assistive device: Rolling walker (2 wheeled) Gait Pattern/deviations: Step-to pattern,Step-through pattern,Decreased stance time - left,Decreased step length - right,Trunk flexed General Gait Details: Slow, mildly unsteady gait with left knee instability and cues for RW proximity/management and for forward gaze. Gait velocity: decreased Gait velocity interpretation: <1.31 ft/sec, indicative of household ambulator  ADL: ADL Overall ADL's : Needs assistance/impaired Eating/Feeding: Set up Grooming:  Maximal assistance,Standing Grooming Details (indicate cue type and reason): pt with left lateral lean while washing hands at sink, required maxA to stabilize Upper Body Bathing: Minimal assistance,Sitting Lower Body Bathing: Maximal assistance,Sit to/from stand Upper Body Dressing : Moderate assistance,Sitting Lower Body Dressing: Maximal assistance,Sit to/from stand Toilet Transfer: Moderate assistance,RW Toilet Transfer Details (indicate cue type and reason): pt ambulated 10 feet with minimal  assistance initially then requiring moderate assistance due to fatigue, LLE weakness Toileting- Clothing Manipulation and Hygiene: Total assistance Toileting - Clothing Manipulation Details (indicate cue type and reason): incontinenet of urine/BM; states he is unsure when he has to go Functional mobility during ADLs: Rolling walker,Moderate assistance General ADL Comments: ambulated short distance in room;decreased activity tolerance and endurance;required seat at sink level and then squat pivot to return to recliner  Cognition: Cognition Overall Cognitive Status: Within Functional Limits for tasks assessed Arousal/Alertness: Awake/alert Orientation Level: Oriented X4 Attention: Selective Selective Attention: Appears intact Memory: Appears intact Awareness: Appears intact Problem Solving: Appears intact Safety/Judgment: Appears intact Cognition Arousal/Alertness: Awake/alert Behavior During Therapy: WFL for tasks assessed/performed Overall Cognitive Status: Within Functional Limits for tasks assessed Area of Impairment: Memory,Safety/judgement Current Attention Level: Selective Memory: Decreased short-term memory Safety/Judgement: Decreased awareness of safety,Decreased awareness of deficits Awareness: Emergent Problem Solving: Slow processing General Comments: Needs repetition at times to follow commands.     Blood pressure 131/81, pulse 79, temperature 98.4 F (36.9 C), temperature source Oral, resp. rate 18, height 5\' 9"  (1.753 m), weight 97.7 kg, SpO2 99 %. Physical Exam  General: Alert and oriented x 3, No apparent distress HEENT: Head is normocephalic, atraumatic, PERRLA, EOMI, sclera anicteric, oral mucosa pink and moist, dentition intact, ext ear canals clear,  Neck: Supple without JVD or lymphadenopathy Heart: Rhythm irregular.  Chest: CTA bilaterally without wheezes, rales, or rhonchi; no distress Abdomen: Soft, non-tender, non-distended, bowel sounds  positive. Extremities: No clubbing, cyanosis, or edema. Pulses are 2+ Psych: Pt's affect is appropriate. Pt is cooperative Skin: Clean and intact without signs of breakdown Neuro: Speech clear. Able to follow simple one step commands. Left sided weakness noted.  4/5 throughout left side. Right foot in boot.   Results for orders placed or performed during the hospital encounter of 08/11/20 (from the past 48 hour(s))  Glucose, capillary     Status: Abnormal   Collection Time: 08/11/20  5:13 PM  Result Value Ref Range   Glucose-Capillary 101 (H) 70 - 99 mg/dL    Comment: Glucose reference range applies only to samples taken after fasting for at least 8 hours.   No results found.     Medical Problem List and Plan: 1.  Impaired mobility and ADLs secondary to acute right sided CVA and right sided ankle fracture  -patient may shower  -ELOS/Goals: 10-14 days modI  2.  Antithrombotics: -DVT/anticoagulation:  Pharmaceutical: Xarelto  -antiplatelet therapy: N/A 3. Pain Management: Oxycodone prn 4. Mood: LCSW to follow for evaluation and support.   -antipsychotic agents: N/A 5. Neuropsych: This patient is capable of making decisions on his own behalf. 6. Skin/Wound Care: Routine pressure relief measures.  7. Fluids/Electrolytes/Nutrition: Monitor I/O. Check CMET in am.  8. Right ankle Fx: WBAT with CAM boot. Repeat XRs (wife will let us know which specifically his surgeon requests) and call surgeon to discuss results, hopefully he can be downgraded to aircast this week. Continue ankle range of motion and strengthening exercises.  9. CAD s/p CABG: On Lipitor  10 A fib: Monitor HR tid--continue Xarelto. Resume metoprolol  11. HTN: Monitor BP tid--has been controlled without medications. Avoid hypoperfusion--out of 5 day window.   --Will resume metoprolol. Continue to hold Lasix, Diovan and Amlodipine--low dose due to SE)  12.   H/o gout: Managed on allopurinol.  13. T2DM: Hgb A1c-6.5--> is  better per wife.    --Will monitor BS ac/hs. Add CM restrictions to diet. .  14. Vitamin B 12 deficiency: Resume supplement.  15. Left knee OA: Ice TID. Order neoprene knee sleeve for support. Quadriceps strengthening exercises.  16. Obesity (BMI 30.51): He will benefit from dietary counseling.  17. Chronic Phimosis: patient to continue to reduce nightly.   --Frequency managed with pads.  18. Disposition: plan is for d/c home with wife, who is a former Therapist, sports. He has home wheelchair, grab bar, chair lift is being installed   Reesa Chew, PA-C  I have personally performed a face to face diagnostic evaluation, including, but not limited to relevant history and physical exam findings, of this patient and developed relevant assessment and plan.  Additionally, I have reviewed and concur with the physician assistant's documentation above.  Leeroy Cha, MD

## 2020-08-11 NOTE — Progress Notes (Signed)
Inpatient Rehabilitation Medication Review by a Pharmacist  A complete drug regimen review was completed for this patient to identify any potential clinically significant medication issues.  Clinically significant medication issues were identified:  yes   Type of Medication Issue Identified Description of Issue Urgent (address now) Non-Urgent (address on AM team rounds) Plan   Drug Interaction(s) (clinically significant)       Duplicate Therapy       Allergy       No Medication Administration End Date       Incorrect Dose       Additional Drug Therapy Needed  Per DC summary, restart simvastatin 20mg  daily Home furosemide 20mg  daily PRN swelling not restarted  Non-urgent F/u with team in AM to restart simvastatin 20mg  daily  Restart furosemide 20mg  daily PRN if needed for edema   Other  Per DC summary, restart amlodipine 2.5mg  daily, valsartan 320mg  daily and spironolactone 12.5mg  daily as needed for BP control Non-urgent Restart BP meds as needed       For non-urgent medication issues to be resolved on team rounds tomorrow morning a [facility] Secure Chat Handoff was sent to:  AM Pharmacist    Time spent performing this drug regimen review (minutes):  [place], PharmD, [place], [facility] Clinical Pharmacist  Please check AMION for all [place] phone numbers After 10:00 PM, call [place]

## 2020-08-11 NOTE — Care Management Important Message (Signed)
Important Message  Patient Details  Name: Shane Espinoza MRN: [id number] Date of Birth: 1940-10-27   Medicare Important Message Given:  Yes     Shane Espinoza [name] 08/11/2020, 2:46 PM

## 2020-08-11 NOTE — Progress Notes (Signed)
Physical Therapy Treatment Patient Details Name: Shane Espinoza MRN: [id number] DOB: 08/21/1940 Today's Date: 08/11/2020    History of Present Illness 80 y.o. male right hand PMHx CAD s/p CABG x 4; CVA (2015); HTN; and afib (rivaroxaban) presenting with acute L-sided weakness. The patient was walking with his walker and then noticed acute left arm weakness, shaking and some leg weakness.MRI brain showed focus of restricted diffusion consistent with acute/subacute infarct in the periventricular white matter adjacent to the body of the right lateral ventricle    PT Comments    Focus of session was gait training as pt has been performing his HEP between therapy sessions, several times a day. Today, pt was able to perform transfers and ambulation with gross min assist and RW for support. Wife present throughout session and assisted with chair follow. Pt motivated and progressing well towards physical therapy goals. He anticipates d/c to CIR this afternoon.    Follow Up Recommendations  CIR;Supervision for mobility/OOB     Equipment Recommendations  None recommended by PT    Recommendations for Other Services Rehab consult     Precautions / Restrictions Precautions Precautions: Fall Precaution Comments: Rt [place] when OOB Required Braces or Orthoses: Other Brace Other Brace: fall in January resulting in R ankle fx Restrictions Weight Bearing Restrictions: Yes RLE Weight Bearing: Weight bearing as tolerated Other Position/Activity Restrictions: with [place]    Mobility  Bed Mobility               General bed mobility comments: Pt was received sitting up in the recliner with wife present  Transfers Overall transfer level: Needs assistance Equipment used: Rolling walker (2 wheeled) Transfers: Sit to/from Stand Sit to Stand: Min assist         General transfer comment: Assist to power-up to full standing position. Increased time to transition hands from arm rests  of chair to RW  Ambulation/Gait Ambulation/Gait assistance: Min assist;+2 safety/equipment Gait Distance (Feet): 200 Feet Assistive device: Rolling walker (2 wheeled) Gait Pattern/deviations: Step-to pattern;Step-through pattern;Decreased stance time - left;Decreased step length - right;Trunk flexed Gait velocity: decreased Gait velocity interpretation: <1.31 ft/sec, indicative of household ambulator General Gait Details: Slow, mildly unsteady gait with left knee instability and cues for RW proximity/management and for forward gaze. Pt did progress to a smoother step-through pattern this session with increased heel strike.   Stairs             Wheelchair Mobility    Modified Rankin (Stroke Patients Only) Modified Rankin (Stroke Patients Only) Pre-Morbid Rankin Score: Moderately severe disability Modified Rankin: Moderately severe disability     Balance Overall balance assessment: Needs assistance;History of Falls Sitting-balance support: Feet supported;Single extremity supported Sitting balance-Leahy Scale: Poor Sitting balance - Comments: Requires UE support sitting EOB, posterior LOB with dynamic activities EOB. Postural control: Posterior lean Standing balance support: During functional activity Standing balance-Leahy Scale: Poor Standing balance comment: Requires UE support in standing and cues for upright, posterior tendency.                            Cognition Arousal/Alertness: Awake/alert Behavior During Therapy: WFL for tasks assessed/performed Overall Cognitive Status: Within Functional Limits for tasks assessed Area of Impairment: Memory;Safety/judgement                   Current Attention Level: Selective Memory: Decreased short-term memory   Safety/Judgement: Decreased awareness of safety;Decreased awareness of deficits Awareness: Emergent Problem Solving:  Slow processing General Comments: Needs repetition at times to follow  commands.      Exercises      General Comments        Pertinent Vitals/Pain Pain Assessment: No/denies pain Pain Intervention(s): Monitored during session    Home Living                      Prior Function            PT Goals (current goals can now be found in the care plan section) Acute Rehab PT Goals Patient Stated Goal: to get better PT Goal Formulation: With patient/family Time For Goal Achievement: 08/20/20 Potential to Achieve Goals: Good Progress towards PT goals: Progressing toward goals    Frequency    Min 4X/week      PT Plan Current plan remains appropriate    Co-evaluation              AM-PAC PT "6 Clicks" Mobility   Outcome Measure  Help needed turning from your back to your side while in a flat bed without using bedrails?: A Little Help needed moving from lying on your back to sitting on the side of a flat bed without using bedrails?: A Lot Help needed moving to and from a bed to a chair (including a wheelchair)?: A Little Help needed standing up from a chair using your arms (e.g., wheelchair or bedside chair)?: A Lot Help needed to walk in hospital room?: A Little Help needed climbing 3-5 steps with a railing? : A Lot 6 Click Score: 15    End of Session Equipment Utilized During Treatment: Gait belt Activity Tolerance: Patient tolerated treatment well Patient left: in chair;with call bell/phone within reach;with chair alarm set;with family/visitor present Nurse Communication: Mobility status PT Visit Diagnosis: Hemiplegia and hemiparesis;Unsteadiness on feet (R26.81) Hemiplegia - Right/Left: Left Hemiplegia - dominant/non-dominant: Non-dominant Hemiplegia - caused by: Cerebral infarction     Time: 0086-7619 PT Time Calculation (min) (ACUTE ONLY): 37 min  Charges:  $Gait Training: 23-37 mins                     Rolinda Roan, PT, DPT Acute Rehabilitation Services Pager: [phone] Office: [phone]    Thelma Comp 08/11/2020, 2:30 PM

## 2020-08-11 NOTE — Progress Notes (Signed)
Physical Medicine and Rehabilitation Consult Reason for Consult: Left-sided weakness Referring Physician: [facility]   HPI: Shane Espinoza is a 80 y.o. right-handed male with history of anxiety, atrial fibrillation, CVA 2015, CAD with CABG 2012 maintained on Xarelto as well as recent right ankle nondisplaced fracture of the right lateral malleolus with additional findings as suggestive of a small avulsion fracture adjacent to the right medial malleolus maintained in a cam boot and weightbearing as tolerated.  Presented to 09/19/2020 with acute onset of left-sided weakness and lightheadedness.  CT/MRI showed focus of restricted diffusion consistent with acute subacute infarct in the periventricular white matter adjacent to the body of the right lateral ventricle.  MRA unremarkable.  Patient did not receive TPA.  Echocardiogram with ejection fraction of 55 to 60% no wall motion abnormalities grade 1 diastolic dysfunction..  Carotid Dopplers with no ICA stenosis.  Admission chemistries unremarkable except glucose 132, urinalysis positive nitrite.  Neurology consulted currently maintained on aspirin as well as Xarelto for CVA prophylaxis.  Tolerating a regular diet. Physical Medicine & Rehabilitation was consulted to assess candidacy for CIR given impaired mobility and ADLs.    Review of Systems  Constitutional: Negative for chills and fever.  HENT: Negative for hearing loss.   Eyes: Negative for blurred vision and double vision.  Respiratory: Negative for cough and shortness of breath.   Cardiovascular: Negative for chest pain, palpitations and leg swelling.  Gastrointestinal: Positive for constipation. Negative for heartburn, nausea and vomiting.  Genitourinary: Negative for flank pain and hematuria.  Musculoskeletal: Positive for myalgias.  Skin: Negative for rash.  Neurological: Positive for weakness.  Psychiatric/Behavioral:       Anxiety  All other systems reviewed and are  negative.      Past Medical History:  Diagnosis Date  . Anxiety   . Atrial fibrillation ([place])   . CAD (coronary artery disease)    [name]  . Gout   . Hypertension   . 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 CARDIAC CATHETERIZATION  03/02/2011   Recommended CABG  . COLON SURGERY    . CORONARY ARTERY BYPASS GRAFT  03/07/2011   DR.VAN  TRIGHT  . EYE SURGERY    . LEXISCAN MYOVIEW  02/20/2012   EKG negative for ischemia, noraml study  . TRANSTHORACIC ECHOCARDIOGRAM  02/20/2012   EF 50-55%, mild-moderate concentric LVH, LA moderately dilated, moderate mitral regurg  . VASCULAR DOPPLER  03/03/2011   Nosignificant extracranial carotid artery stenosis        Family History  Problem Relation Age of Onset  . CVA Father   . Heart attack Maternal Grandmother   . Heart attack Maternal Grandfather   . Heart murmur Sister   . Hypertension Son    Social History:  reports that he has never smoked. He has never used smokeless tobacco. He reports that he does not drink alcohol and does not use drugs. Allergies: No Known Allergies       Medications Prior to Admission  Medication Sig Dispense Refill  . acetaminophen (TYLENOL) 500 MG tablet Take 500 mg by mouth every 6 (six) hours as needed for moderate pain.    Marland Kitchen allopurinol (ZYLOPRIM) 100 MG tablet Take 1 tablet by mouth daily.  2  . amLODipine (NORVASC) 5 MG tablet Take 2.5 mg by mouth every evening.    . beta carotene w/minerals (OCUVITE) tablet  Take 1 tablet by mouth daily.    . Cholecalciferol (VITAMIN D3) 5000 UNITS CAPS Take by mouth. Monday, Wednesday, Friday    . fish oil-omega-3 fatty acids 1000 MG capsule Take 1 g by mouth 3 (three) times a week.    . furosemide (LASIX) 20 MG tablet TAKE 1 TABLET DAILY, TAKE 1EXTRA TABLET AS NEEDED FOR SWELLING 180 tablet 3  . HYDROcodone-acetaminophen (NORCO/VICODIN) 5-325 MG tablet Take 1 tablet  by mouth every 4 (four) hours as needed for moderate pain or severe pain.    . metoprolol succinate (TOPROL-XL) 25 MG 24 hr tablet Take 1 tablet (25 mg total) by mouth daily. 90 tablet 3  . oxyCODONE-acetaminophen (PERCOCET/ROXICET) 5-325 MG tablet Take 1 tablet by mouth every 6 (six) hours as needed for moderate pain or severe pain.    . Probiotic Product (ALIGN) 4 MG CAPS Take 1 capsule by mouth every other day.    . simvastatin (ZOCOR) 20 MG tablet Take 1 tablet (20 mg total) by mouth at bedtime. 90 tablet 3  . spironolactone (ALDACTONE) 25 MG tablet Take 0.5 tablets (12.5 mg total) by mouth daily. 45 tablet 3  . valsartan (DIOVAN) 320 MG tablet TAKE 1 TABLET DAILY        (DISCONTINUE IRBESARTAN DUETO BACK ORDER) 90 tablet 3  . vitamin B-12 (CYANOCOBALAMIN) 1000 MCG tablet Take 1,000 mcg by mouth every evening.    Alveda Reasons 20 MG TABS tablet TAKE 1 TABLET DAILY WITH   SUPPER 90 tablet 1    Home: Home Living Family/patient expects to be discharged to:: Private residence Living Arrangements: Spouse/significant other Available Help at Discharge: Available 24 hours/day Type of Home: House Home Access: Stairs to enter [employer] of Steps: 3 Entrance Stairs-Rails: Right,Left Home Layout: Two level Alternate Level Stairs-Number of Steps: 13 steps (Has Stair lift) Bathroom Shower/Tub: Multimedia programmer: Handicapped height Bathroom Accessibility: Yes Home Equipment: Environmental consultant - 2 wheels,Wheelchair - manual,Shower seat - built in,Grab bars - tub/shower,Grab bars - toilet,Bedside commode,Cane - single point,Walker - 4 wheels  Functional History: Prior Function Level of Independence: Independent with assistive device(s),Needs assistance (wife "watches") Gait / Transfers Assistance Needed: recent fall; states because of bad knee ADL's / Homemaking Assistance Needed: up until fracture he was completing his ADLs independently; wife has been Orthoptist /  Swallowing Assistance Needed: speech different from baseline Functional Status:  Mobility: Bed Mobility Overal bed mobility: Needs Assistance Bed Mobility: Sit to Supine Supine to sit: Mod assist,HOB elevated Sit to supine: Mod assist General bed mobility comments: A to lift BLE onto bed Transfers Overall transfer level: Needs assistance Equipment used: 2 person hand held assist Transfers: Sit to/from [employer] Sit to Stand: Max assist Stand pivot transfers: Max assist,+2 physical assistance Squat pivot transfers: +2 safety/equipment,Mod assist General transfer comment: difficulty advancing/stepping LLE; R knee pain/difficulty wtih terminal extension  ADL: ADL Overall ADL's : Needs assistance/impaired Eating/Feeding: Set up Grooming: Supervision/safety,Set up,Sitting Upper Body Bathing: Minimal assistance,Sitting Lower Body Bathing: Maximal assistance,Sit to/from stand Upper Body Dressing : Moderate assistance,Sitting Lower Body Dressing: Maximal assistance,Sit to/from stand Toilet Transfer: Maximal assistance,+2 for safety/equipment Toileting- Clothing Manipulation and Hygiene: Total assistance Toileting - Clothing Manipulation Details (indicate cue type and reason): incontinenet of urine/BM; states he is unsure when he has to go Functional mobility during ADLs: Maximal assistance,+2 for safety/equipment,Rolling walker,Cueing for safety,Cueing for sequencing General ADL Comments: RW used to stand. Unable to safely trnasfer with RW due to L knee buckling and R knee  weakness  Cognition: Cognition Overall Cognitive Status: Impaired/Different from baseline Orientation Level: Oriented X4 Cognition Arousal/Alertness: Awake/alert Behavior During Therapy: WFL for tasks assessed/performed Overall Cognitive Status: Impaired/Different from baseline Area of Impairment: Attention,Memory,Safety/judgement,Awareness,Problem solving Current Attention Level:  Selective Memory: Decreased short-term memory Safety/Judgement: Decreased awareness of safety,Decreased awareness of deficits Awareness: Emergent Problem Solving: Slow processing General Comments: Wife states this is baseline however pt appears impaired. Unaware of falling toward L; VC required to teach pt how to move to EBO; will further assess  Blood pressure 113/79, pulse 73, temperature 98.1 F (36.7 C), temperature source Oral, resp. rate 18, height 5\' 9"  (1.753 m), weight 93.7 kg, SpO2 98 %. Physical Exam General: Alert and oriented x 3, No apparent distress HEENT: Head is normocephalic, atraumatic, PERRLA, EOMI, sclera anicteric, oral mucosa pink and moist, dentition intact, ext ear canals clear,  Neck: Supple without JVD or lymphadenopathy Heart: Reg rate and rhythm. No murmurs rubs or gallops Chest: CTA bilaterally without wheezes, rales, or rhonchi; no distress Abdomen: Soft, non-tender, non-distended, bowel sounds positive. Extremities: No clubbing, cyanosis, or edema. Pulses are 2+ Psych: Pt's affect is appropriate. Pt is cooperative Skin: Clean and intact without signs of breakdown Neurological:     Comments: Patient is alert in no acute distress oriented x3 and follows commands. Left sided strength 4/5, right sided 5/5 with the exceptio of right ankle- in boot.    Lab Results Last 24 Hours       Results for orders placed or performed during the hospital encounter of 08/05/20 (from the past 24 hour(s))  Ethanol     Status: None   Collection Time: 08/05/20 10:33 AM  Result Value Ref Range   Alcohol, Ethyl (B) <10 <10 mg/dL  Protime-INR     Status: Abnormal   Collection Time: 08/05/20 10:33 AM  Result Value Ref Range   Prothrombin Time 23.5 (H) 11.4 - 15.2 seconds   INR 2.2 (H) 0.8 - 1.2  APTT     Status: Abnormal   Collection Time: 08/05/20 10:33 AM  Result Value Ref Range   aPTT 48 (H) 24 - 36 seconds  CBC     Status: None   Collection Time: 08/05/20  10:33 AM  Result Value Ref Range   WBC 8.7 4.0 - 10.5 K/uL   RBC 5.20 4.22 - 5.81 MIL/uL   Hemoglobin 15.4 13.0 - 17.0 g/dL   HCT 48.8 39.0 - 52.0 %   MCV 93.8 80.0 - 100.0 fL   MCH 29.6 26.0 - 34.0 pg   MCHC 31.6 30.0 - 36.0 g/dL   RDW 13.4 11.5 - 15.5 %   Platelets 194 150 - 400 K/uL   nRBC 0.0 0.0 - 0.2 %  Differential     Status: None   Collection Time: 08/05/20 10:33 AM  Result Value Ref Range   Neutrophils Relative % 69 %   Neutro Abs 6.0 1.7 - 7.7 K/uL   Lymphocytes Relative 20 %   Lymphs Abs 1.8 0.7 - 4.0 K/uL   Monocytes Relative 8 %   Monocytes Absolute 0.7 0.1 - 1.0 K/uL   Eosinophils Relative 2 %   Eosinophils Absolute 0.1 0.0 - 0.5 K/uL   Basophils Relative 1 %   Basophils Absolute 0.1 0.0 - 0.1 K/uL   Immature Granulocytes 0 %   Abs Immature Granulocytes 0.03 0.00 - 0.07 K/uL  Comprehensive metabolic panel     Status: Abnormal   Collection Time: 08/05/20 10:33 AM  Result Value Ref Range  Sodium 138 135 - 145 mmol/L   Potassium 3.8 3.5 - 5.1 mmol/L   Chloride 103 98 - 111 mmol/L   CO2 24 22 - 32 mmol/L   Glucose, Bld 132 (H) 70 - 99 mg/dL   BUN 17 8 - 23 mg/dL   Creatinine, Ser 1.16 0.61 - 1.24 mg/dL   Calcium 9.3 8.9 - 10.3 mg/dL   Total Protein 7.1 6.5 - 8.1 g/dL   Albumin 3.3 (L) 3.5 - 5.0 g/dL   AST 29 15 - 41 U/L   ALT 22 0 - 44 U/L   Alkaline Phosphatase 74 38 - 126 U/L   Total Bilirubin 1.3 (H) 0.3 - 1.2 mg/dL   GFR, Estimated >60 >60 mL/min   Anion gap 11 5 - 15  Urine rapid drug screen (hosp performed)     Status: None   Collection Time: 08/05/20 10:33 AM  Result Value Ref Range   Opiates NONE DETECTED NONE DETECTED   Cocaine NONE DETECTED NONE DETECTED   Benzodiazepines NONE DETECTED NONE DETECTED   Amphetamines NONE DETECTED NONE DETECTED   Tetrahydrocannabinol NONE DETECTED NONE DETECTED   Barbiturates NONE DETECTED NONE DETECTED  Urinalysis, Routine w reflex microscopic Urine, Clean Catch      Status: None   Collection Time: 08/05/20 10:33 AM  Result Value Ref Range   Color, Urine YELLOW YELLOW   APPearance CLEAR CLEAR   Specific Gravity, Urine 1.006 1.005 - 1.030   pH 6.0 5.0 - 8.0   Glucose, UA NEGATIVE NEGATIVE mg/dL   Hgb urine dipstick NEGATIVE NEGATIVE   Bilirubin Urine NEGATIVE NEGATIVE   Ketones, ur NEGATIVE NEGATIVE mg/dL   Protein, ur NEGATIVE NEGATIVE mg/dL   Nitrite NEGATIVE NEGATIVE   Leukocytes,Ua NEGATIVE NEGATIVE  I-stat chem 8, ED     Status: Abnormal   Collection Time: 08/05/20 10:58 AM  Result Value Ref Range   Sodium 136 135 - 145 mmol/L   Potassium 5.4 (H) 3.5 - 5.1 mmol/L   Chloride 104 98 - 111 mmol/L   BUN 28 (H) 8 - 23 mg/dL   Creatinine, Ser 1.00 0.61 - 1.24 mg/dL   Glucose, Bld 130 (H) 70 - 99 mg/dL   Calcium, Ion 1.04 (L) 1.15 - 1.40 mmol/L   TCO2 25 22 - 32 mmol/L   Hemoglobin 17.0 13.0 - 17.0 g/dL   HCT 50.0 39.0 - 52.0 %  SARS CORONAVIRUS 2 (TAT 6-24 HRS) Nasopharyngeal Nasopharyngeal Swab     Status: None   Collection Time: 08/05/20  1:15 PM   Specimen: Nasopharyngeal Swab  Result Value Ref Range   SARS Coronavirus 2 NEGATIVE NEGATIVE  TSH     Status: None   Collection Time: 08/05/20  6:45 PM  Result Value Ref Range   TSH 2.493 0.350 - 4.500 uIU/mL  Hemoglobin A1c     Status: Abnormal   Collection Time: 08/06/20  6:04 AM  Result Value Ref Range   Hgb A1c MFr Bld 6.5 (H) 4.8 - 5.6 %   Mean Plasma Glucose 139.85 mg/dL  Lipid panel     Status: Abnormal   Collection Time: 08/06/20  6:04 AM  Result Value Ref Range   Cholesterol 101 0 - 200 mg/dL   Triglycerides 102 <150 mg/dL   HDL 36 (L) >40 mg/dL   Total CHOL/HDL Ratio 2.8 RATIO   VLDL 20 0 - 40 mg/dL   LDL Cholesterol 45 0 - 99 mg/dL      Imaging Results (Last 48 hours)  CT HEAD  WO CONTRAST  Result Date: 08/05/2020 CLINICAL DATA:  Transient ischemic attack, episode of LEFT-sided weakness and dizziness today lasting 1 hour,  symptoms resolved EXAM: CT HEAD WITHOUT CONTRAST TECHNIQUE: Contiguous axial images were obtained from the base of the skull through the vertex without intravenous contrast. Sagittal and coronal MPR images reconstructed from axial data set. COMPARISON:  09/23/2013 FINDINGS: Brain: Generalized atrophy. Normal ventricular morphology. No midline shift or mass effect. Small vessel chronic ischemic changes of deep cerebral white matter. No intracranial hemorrhage, mass lesion, evidence of acute infarction, or extra-axial fluid collection. Vascular: Atherosclerotic calcification of internal carotid and vertebral arteries at skull base Skull: Intact Sinuses/Orbits: Scattered mucosal thickening in ethmoid air cells and sphenoid sinus. Minimal fluid RIGHT maxillary sinus. Other: N/A IMPRESSION: Atrophy with small vessel chronic ischemic changes of deep cerebral white matter. No acute intracranial abnormalities. Electronically Signed   By: Lavonia Dana M.D.   On: 08/05/2020 11:19   MR ANGIO HEAD WO CONTRAST  Result Date: 08/05/2020 CLINICAL DATA:  Transient ischemic attack. EXAM: MRA HEAD WITHOUT CONTRAST TECHNIQUE: Angiographic images of the Circle of Willis were obtained using MRA technique without intravenous contrast. COMPARISON:  None. FINDINGS: The visualized portions of the distal cervical and intracranial internal carotid arteries are widely patent with normal flow related enhancement. The bilateral anterior cerebral arteries and middle cerebral arteries are widely patent with antegrade flow without high-grade flow-limiting stenosis or proximal branch occlusion. No intracranial aneurysm within the anterior circulation. The vertebral arteries are widely patent with antegrade flow. Vertebrobasilar junction and basilar artery are widely patent with antegrade flow without evidence of basilar stenosis or aneurysm. Posterior cerebral arteries are normal bilaterally. No intracranial aneurysm within the posterior  circulation. IMPRESSION: Normal intracranial MRA. Electronically Signed   By: Pedro Earls M.D.   On: 08/05/2020 16:45   MR Angiogram Neck W or Wo Contrast  Result Date: 08/05/2020 CLINICAL DATA:  Stroke EXAM: MRA NECK WITHOUT AND WITH CONTRAST TECHNIQUE: Multiplanar and multiecho pulse sequences of the neck were obtained without and with intravenous contrast. Angiographic images of the neck were obtained using MRA technique without and with intravenous contrast. CONTRAST:  9.29mL GADAVIST GADOBUTROL 1 MMOL/ML IV SOLN COMPARISON:  MRI head 08/05/2020 FINDINGS: Image quality degraded by extensive motion. In addition, there is suboptimal arterial enhancement Decreased signal at the carotid bifurcation bilaterally consistent with stenosis which is difficult to quantify. Possible severe stenosis at the left carotid bifurcation. Both vertebral arteries appear patent with antegrade flow. IMPRESSION: Examination limited by motion and suboptimal arterial opacification Probable bilateral carotid bifurcation stenosis left greater than right. Correlate with CTA or Doppler. Electronically Signed   By: Franchot Gallo M.D.   On: 08/05/2020 17:02   MR BRAIN WO CONTRAST  Result Date: 08/05/2020 CLINICAL DATA:  Transient ischemic attack. EXAM: MRI HEAD WITHOUT CONTRAST TECHNIQUE: Multiplanar, multiecho pulse sequences of the brain and surrounding structures were obtained without intravenous contrast. COMPARISON:  Head CT July 05, 2020. FINDINGS: Brain: Focus of restricted diffusion consistent with acute/subacute infarct in the periventricular white matter adjacent to the body of the right lateral ventricle. No hemorrhage, hydrocephalus, extra-axial collection or mass lesion. Scattered foci of T2 hyperintensity are seen in the white matter of the cerebral hemispheres, nonspecific. Moderate parenchymal volume loss. Vascular: Normal flow voids. Skull and upper cervical spine: Normal marrow signal.  Sinuses/Orbits: Mucosal thickening throughout the paranasal sinuses with fluid level within the left sphenoid sinus. Right lens surgery. Other: Mild bilateral mastoid effusions, right greater than left.  IMPRESSION: 1. Focus of restricted diffusion consistent with acute/subacute infarct in the periventricular white matter adjacent to the body of the right lateral ventricle. 2. Scattered foci of T2 hyperintensity in the white matter of the cerebral hemispheres, nonspecific but may represent chronic microvascular ischemic changes. 3. Moderate parenchymal volume loss. 4. Paranasal sinus disease with fluid level within the left sphenoid sinus. Correlate for acute sinusitis. 5. Mild bilateral mastoid effusions, right greater than left. These results were called by telephone at the time of interpretation on 08/05/2020 at 4:16 pm to Dr. Thomasenia Bottoms, Who verbally acknowledged these results. Electronically Signed   By: Pedro Earls M.D.   On: 08/05/2020 16:18   VAS US CAROTID (at [facility] and [facility] only)  Result Date: 08/06/2020 Carotid Arterial Duplex Study Indications:       TIA. Risk Factors:      Hypertension, coronary artery disease. Comparison Study:  no prior Performing Technologist: Abram Sander RVS  Examination Guidelines: A complete evaluation includes B-mode imaging, spectral Doppler, color Doppler, and power Doppler as needed of all accessible portions of each vessel. Bilateral testing is considered an integral part of a complete examination. Limited examinations for reoccurring indications may be performed as noted.  Right Carotid Findings: +----------+--------+--------+--------+------------------+--------+           PSV cm/sEDV cm/sStenosisPlaque DescriptionComments +----------+--------+--------+--------+------------------+--------+ CCA Prox  55      11              heterogenous               +----------+--------+--------+--------+------------------+--------+ CCA Distal37      7                heterogenous               +----------+--------+--------+--------+------------------+--------+ ICA Prox  47      14      1-39%   heterogenous               +----------+--------+--------+--------+------------------+--------+ ICA Distal90      21                                         +----------+--------+--------+--------+------------------+--------+ ECA       55      4                                          +----------+--------+--------+--------+------------------+--------+ +----------+--------+-------+--------+-------------------+           PSV cm/sEDV cmsDescribeArm Pressure (mmHG) +----------+--------+-------+--------+-------------------+ Subclavian70                                         +----------+--------+-------+--------+-------------------+ +---------+--------+--+--------+--+---------+ VertebralPSV cm/s54EDV cm/s10Antegrade +---------+--------+--+--------+--+---------+  Left Carotid Findings: +----------+--------+--------+--------+------------------+---------+           PSV cm/sEDV cm/sStenosisPlaque DescriptionComments  +----------+--------+--------+--------+------------------+---------+ CCA Prox  86      13              heterogenous                +----------+--------+--------+--------+------------------+---------+ CCA Distal43      7               heterogenous                +----------+--------+--------+--------+------------------+---------+  ICA Prox  67      24      1-39%   heterogenous      Shadowing +----------+--------+--------+--------+------------------+---------+ ICA Distal52      13                                          +----------+--------+--------+--------+------------------+---------+ ECA       64      9                                           +----------+--------+--------+--------+------------------+---------+ +----------+--------+--------+--------+-------------------+           PSV  cm/sEDV cm/sDescribeArm Pressure (mmHG) +----------+--------+--------+--------+-------------------+ [id number]                                          +----------+--------+--------+--------+-------------------+ +---------+--------+--+--------+--+---------+ VertebralPSV cm/s40EDV cm/s12Antegrade +---------+--------+--+--------+--+---------+   Summary: Right Carotid: Velocities in the right ICA are consistent with a 1-39% stenosis. Left Carotid: Velocities in the left ICA are consistent with a 1-39% stenosis. Vertebrals: Bilateral vertebral arteries demonstrate antegrade flow. *See table(s) above for measurements and observations.     Preliminary       Assessment/Plan: Diagnosis: Right sided infarct in the periventricular white matter 1. Does the need for close, 24 hr/day medical supervision in concert with the patient's rehab needs make it unreasonable for this patient to be served in a less intensive setting? Yes 2. Co-Morbidities requiring supervision/potential complications:  1. Atrial fibrillation: HR will be monitored TID 2. Right ankle fracture: XRs to be repeated in 1 week (6 weeks from fracture). We can discuss results with his orthopedic surgeon and hopefully downgrade him to aircast if healing well.  3. Right knee severe OA: continue ICE BID. May benefit from  Neoprene knee sleeve for buckling. Will benefit from quadriceps strengthening.  4. Obesity (BMI 30.51): will benefit from nutrition/exercise counseling 5. HTN: Monitor BP TID 3. Due to bladder management, bowel management, safety, skin/wound care, disease management, medication administration, pain management and patient education, does the patient require 24 hr/day rehab nursing? Yes 4. Does the patient require coordinated care of a physician, rehab nurse, therapy disciplines of PT, OT to address physical and functional deficits in the context of the above medical diagnosis(es)? Yes Addressing deficits in the  following areas: balance, endurance, locomotion, strength, transferring, bowel/bladder control, bathing, dressing, feeding, grooming, toileting, cognition and psychosocial support 5. Can the patient actively participate in an intensive therapy program of at least 3 hrs of therapy per day at least 5 days per week? Yes 6. The potential for patient to make measurable gains while on inpatient rehab is excellent 7. Anticipated functional outcomes upon discharge from inpatient rehab are modified independent  with PT, modified independent with OT, independent with SLP. 8. Estimated rehab length of stay to reach the above functional goals is: 10-14 days 9. Anticipated discharge destination: Home 10. Overall Rehab/Functional Prognosis: excellent  RECOMMENDATIONS: This patient's condition is appropriate for continued rehabilitative care in the following setting: CIR Patient has agreed to participate in recommended program. Yes Note that insurance prior authorization may be required for reimbursement for recommended care.  Comment: Thank you for this consult. Admission coordinator to follow.  I have personally performed a face to face diagnostic evaluation, including, but not limited to relevant history and physical exam findings, of this patient and developed relevant assessment and plan.  Additionally, I have reviewed and concur with the physician assistant's documentation above.  Leeroy Cha, MD  Lavon Paganini Angiulli, PA-C 08/06/2020          Revision History

## 2020-08-11 NOTE — Progress Notes (Signed)
Report given to [facility] at [facility]. Patient to go to room 4M12.

## 2020-08-11 NOTE — Progress Notes (Signed)
Orthopedic Tech Progress Note Patient Details:  Shane Espinoza [facility] 05/02/1941 [id number]  Ortho Devices Type of Ortho Device: Knee Sleeve Ortho Device/Splint Location: RLE Ortho Device/Splint Interventions: Ordered,Adjustment,Application   Post Interventions Patient Tolerated: Well Instructions Provided: Care of [place] 08/11/2020, 4:52 PM

## 2020-08-11 NOTE — H&P (Signed)
Physical Medicine and Rehabilitation Admission H&P    Chief Complaint  Patient presents with  . Stroke with functional deficits    HPI: Shane Espinoza is an 80 year old male with history of CAF, CAD s/p CABG 2012, CVA 2015, A fib- on Xarelto,  fall 07/08/20 with nondisplaced right lateral malleolus and adjacent avulsion fracture of medial malleolus who developed left sided weakness and numbness with fall. UDS negative. LLE symptoms resolved and MRI head showed focus of restricted diffusion with acute/subacute infarct in periventricular white matter adjacent to right ventricle and incidental moderate parenchymal volume loss.  2D echo showed EF 55-60%, mild to moderate aortic valve sclerosis and grade 1 DD. Carotid dopplers were negative for significant ICA stenosis. Dr. Leonie Man felt that stroke was due to small vessel disease and educated patient on importance of taking medications at same time with the largest meal of the day. Patient continues to require CAM boot on RLE--WBAT and now has resultant LUE weakness with instability, left lateral lean and delayed processing with decrease STM affecting ADLs and mobility. CIR recommended due to functional decline.     Review of Systems  Constitutional: Negative for chills and fever.  Eyes: Negative for blurred vision and double vision.  Respiratory: Negative for cough and shortness of breath.   Cardiovascular: Negative for chest pain and palpitations.  Gastrointestinal: Negative for constipation, heartburn and nausea.  Genitourinary: Positive for frequency.       Goes to the BR every 1.5 hours  Musculoskeletal: Positive for joint pain. Negative for myalgias.  Skin: Negative for rash.  Neurological: Positive for weakness. Negative for dizziness and headaches.  Psychiatric/Behavioral: The patient has insomnia. The patient is not nervous/anxious.      Past Medical History:  Diagnosis Date  . Anxiety   . Atrial fibrillation ([place])   .  CAD (coronary artery disease)    [name]  . Gout   . Hypertension   . 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 CARDIAC CATHETERIZATION  03/02/2011   Recommended CABG  . COLON SURGERY    . CORONARY ARTERY BYPASS GRAFT  03/07/2011   DR.VAN  TRIGHT  . EYE SURGERY    . LEXISCAN MYOVIEW  02/20/2012   EKG negative for ischemia, noraml study  . TRANSTHORACIC ECHOCARDIOGRAM  02/20/2012   EF 50-55%, mild-moderate concentric LVH, LA moderately dilated, moderate mitral regurg  . VASCULAR DOPPLER  03/03/2011   Nosignificant extracranial carotid artery stenosis    Family History  Problem Relation Age of Onset  . CVA Father   . Heart attack Maternal Grandmother   . Heart attack Maternal Grandfather   . Heart murmur Sister   . Hypertension Son     Social History: Married. Independent PTA--was using a walker for the past month. He  reports that he has never smoked. He has never used smokeless tobacco. He reports that he does not drink alcohol and does not use drugs.    Allergies: No Known Allergies    Medications Prior to Admission  Medication Sig Dispense Refill  . acetaminophen (TYLENOL) 500 MG tablet Take 500 mg by mouth every 6 (six) hours as needed for moderate pain.    Marland Kitchen allopurinol (ZYLOPRIM) 100 MG tablet Take 1 tablet by mouth daily.  2  . amLODipine (NORVASC) 5 MG tablet Take 2.5 mg by mouth every evening.    . beta carotene w/minerals (OCUVITE) tablet  Take 1 tablet by mouth daily.    . Cholecalciferol (VITAMIN D3) 5000 UNITS CAPS Take by mouth. Monday, Wednesday, Friday    . fish oil-omega-3 fatty acids 1000 MG capsule Take 1 g by mouth 3 (three) times a week.    . furosemide (LASIX) 20 MG tablet TAKE 1 TABLET DAILY, TAKE 1EXTRA TABLET AS NEEDED FOR SWELLING 180 tablet 3  . HYDROcodone-acetaminophen (NORCO/VICODIN) 5-325 MG tablet Take 1 tablet by mouth every 4 (four) hours as needed for moderate pain or severe pain.    .  metoprolol succinate (TOPROL-XL) 25 MG 24 hr tablet Take 1 tablet (25 mg total) by mouth daily. 90 tablet 3  . oxyCODONE-acetaminophen (PERCOCET/ROXICET) 5-325 MG tablet Take 1 tablet by mouth every 6 (six) hours as needed for moderate pain or severe pain.    . Probiotic Product (ALIGN) 4 MG CAPS Take 1 capsule by mouth every other day.    . simvastatin (ZOCOR) 20 MG tablet Take 1 tablet (20 mg total) by mouth at bedtime. 90 tablet 3  . spironolactone (ALDACTONE) 25 MG tablet Take 0.5 tablets (12.5 mg total) by mouth daily. 45 tablet 3  . valsartan (DIOVAN) 320 MG tablet TAKE 1 TABLET DAILY        (DISCONTINUE IRBESARTAN DUETO BACK ORDER) 90 tablet 3  . vitamin B-12 (CYANOCOBALAMIN) 1000 MCG tablet Take 1,000 mcg by mouth every evening.    Alveda Reasons 20 MG TABS tablet TAKE 1 TABLET DAILY WITH   SUPPER 90 tablet 1    Drug Regimen Review  Drug regimen was reviewed and remains appropriate with no significant issues identified  Home: Home Living Family/patient expects to be discharged to:: Private residence Living Arrangements: Spouse/significant other Available Help at Discharge: Available 24 hours/day Type of Home: House Home Access: Stairs to enter [employer] of Steps: 3 Entrance Stairs-Rails: Right,Left Home Layout: Two level Alternate Level Stairs-Number of Steps: 13 steps (Has Stair lift) Bathroom Shower/Tub: Multimedia programmer: Handicapped height Bathroom Accessibility: Yes Home Equipment: Environmental consultant - 2 wheels,Wheelchair - manual,Shower seat - built in,Grab bars - tub/shower,Grab bars - toilet,Bedside commode,Cane - single point,Walker - 4 wheels  Lives With: Spouse   Functional History: Prior Function Level of Independence: Independent with assistive device(s),Needs assistance (wife "watches") Gait / Transfers Assistance Needed: recent fall; states because of bad knee ADL's / Homemaking Assistance Needed: up until fracture he was completing his ADLs  independently; wife has been Orthoptist / Swallowing Assistance Needed: speech different from baseline  Functional Status:  Mobility: Bed Mobility Overal bed mobility: Needs Assistance Bed Mobility: Rolling,Sidelying to Sit Rolling: Min guard Sidelying to sit: Min assist,HOB elevated Supine to sit: Mod assist,HOB elevated Sit to supine: Mod assist General bed mobility comments: Able to get to EOB with Min A to steady once upright due to posterior lean. Increased time/effort due to difficulty scooting LLE out Transfers Overall transfer level: Needs assistance Equipment used: Rolling walker (2 wheeled) Transfer via Lift Equipment: Stedy Transfers: Sit to/from Stand Sit to Stand: Mod assist,Min assist,Min guard Stand pivot transfers: Max assist,+2 physical assistance Squat pivot transfers: Mod assist General transfer comment: Initially mod A to stand from EOB wtih cues for hand placement, use of momentum and cues for anterior weight shift as pt with retropulsion at times. Progressed to Min A and Min guard for a few reps from chair with cues for technique. Performed STS from chair x5 with focus on slow descent and proper technique to rise pushing through UEs. Ambulation/Gait Ambulation/Gait  assistance: Min assist,+2 safety/equipment Gait Distance (Feet): 100 Feet Assistive device: Rolling walker (2 wheeled) Gait Pattern/deviations: Step-to pattern,Step-through pattern,Decreased stance time - left,Decreased step length - right,Trunk flexed General Gait Details: Slow, mildly unsteady gait with left knee instability and cues for RW proximity/management and for forward gaze. Gait velocity: decreased Gait velocity interpretation: <1.31 ft/sec, indicative of household ambulator    ADL: ADL Overall ADL's : Needs assistance/impaired Eating/Feeding: Set up Grooming: Maximal assistance,Standing Grooming Details (indicate cue type and reason): pt with left lateral lean while  washing hands at sink, required maxA to stabilize Upper Body Bathing: Minimal assistance,Sitting Lower Body Bathing: Maximal assistance,Sit to/from stand Upper Body Dressing : Moderate assistance,Sitting Lower Body Dressing: Maximal assistance,Sit to/from stand Toilet Transfer: Moderate assistance,RW Toilet Transfer Details (indicate cue type and reason): pt ambulated 10 feet with minimal assistance initially then requiring moderate assistance due to fatigue, LLE weakness Toileting- Clothing Manipulation and Hygiene: Total assistance Toileting - Clothing Manipulation Details (indicate cue type and reason): incontinenet of urine/BM; states he is unsure when he has to go Functional mobility during ADLs: Rolling walker,Moderate assistance General ADL Comments: ambulated short distance in room;decreased activity tolerance and endurance;required seat at sink level and then squat pivot to return to recliner  Cognition: Cognition Overall Cognitive Status: Within Functional Limits for tasks assessed Arousal/Alertness: Awake/alert Orientation Level: Oriented X4 Attention: Selective Selective Attention: Appears intact Memory: Appears intact Awareness: Appears intact Problem Solving: Appears intact Safety/Judgment: Appears intact Cognition Arousal/Alertness: Awake/alert Behavior During Therapy: WFL for tasks assessed/performed Overall Cognitive Status: Within Functional Limits for tasks assessed Area of Impairment: Memory,Safety/judgement Current Attention Level: Selective Memory: Decreased short-term memory Safety/Judgement: Decreased awareness of safety,Decreased awareness of deficits Awareness: Emergent Problem Solving: Slow processing General Comments: Needs repetition at times to follow commands.   Blood pressure 133/74, pulse 71, temperature 97.8 F (36.6 C), temperature source Oral, resp. rate 18, height 5\' 9"  (1.753 m), weight 93.7 kg, SpO2 95 %. Physical Exam Vitals and nursing  note reviewed.  Constitutional:      Appearance: Normal appearance.  Cardiovascular:     Rate and Rhythm: Rhythm irregular.  Neurological:     Mental Status: He is alert and oriented to person, place, and time.     Comments: Speech clear. Able to follow simple one step commands. Left sided weakness noted.      Results for orders placed or performed during the hospital encounter of 08/05/20 (from the past 48 hour(s))  CBC     Status: None   Collection Time: 08/10/20  3:21 AM  Result Value Ref Range   WBC 9.1 4.0 - 10.5 K/uL   RBC 4.77 4.22 - 5.81 MIL/uL   Hemoglobin 14.4 13.0 - 17.0 g/dL   HCT 44.4 39.0 - 52.0 %   MCV 93.1 80.0 - 100.0 fL   MCH 30.2 26.0 - 34.0 pg   MCHC 32.4 30.0 - 36.0 g/dL   RDW 13.2 11.5 - 15.5 %   Platelets 179 150 - 400 K/uL   nRBC 0.0 0.0 - 0.2 %    Comment: Performed at [facility], [place] [address], [place], [place] [address]  Magnesium     Status: None   Collection Time: 08/10/20  3:21 AM  Result Value Ref Range   Magnesium 2.0 1.7 - 2.4 mg/dL    Comment: Performed at [facility] [address] [place], [place] [address]  Phosphorus     Status: None   Collection Time: 08/10/20  3:21 AM  Result Value Ref Range   Phosphorus 3.7 2.5 - 4.6 mg/dL    Comment: Performed at [facility], [place] [address], [place], [place] [address]  Basic metabolic panel     Status: Abnormal   Collection Time: 08/10/20  3:21 AM  Result Value Ref Range   Sodium 138 135 - 145 mmol/L   Potassium 4.3 3.5 - 5.1 mmol/L   Chloride 106 98 - 111 mmol/L   CO2 23 22 - 32 mmol/L   Glucose, Bld 113 (H) 70 - 99 mg/dL    Comment: Glucose reference range applies only to samples taken after fasting for at least 8 hours.   BUN 16 8 - 23 mg/dL   Creatinine, Ser 0.96 0.61 - 1.24 mg/dL   Calcium 8.9 8.9 - 10.3 mg/dL   GFR, Estimated >60 >60 mL/min    Comment: (NOTE) Calculated using the CKD-EPI Creatinine Equation (2021)    Anion gap 9 5 - 15    Comment: Performed  at [facility] [address], [place], [place] [address]  Glucose, capillary     Status: Abnormal   Collection Time: 08/10/20 11:30 AM  Result Value Ref Range   Glucose-Capillary 138 (H) 70 - 99 mg/dL    Comment: Glucose reference range applies only to samples taken after fasting for at least 8 hours.   No results found.     Medical Problem List and Plan: 1.  Impaired mobility and ADLs secondary to acute CVA and right sided ankle fracture  -patient may shower  -ELOS/Goals: 10-14 days modI  2.  Antithrombotics: -DVT/anticoagulation:  Pharmaceutical: Xarelto  -antiplatelet therapy: N/A 3. Pain Management: Oxycodone prn 4. Mood: LCSW to follow for evaluation and support.   -antipsychotic agents: N/A 5. Neuropsych: This patient is capable of making decisions on his own behalf. 6. Skin/Wound Care: Routine pressure relief measures.  7. Fluids/Electrolytes/Nutrition: Monitor I/O. Check CMET in am.  8. Right ankle Fx: WBAT with CAM boot. Repeat XRs (wife will let us know which specifically his surgeon requests) and call surgeon to discuss results, hopefully he can be downgraded to aircast this week. Continue ankle range of motion and strengthening exercises.  9. CAD s/p CABG: On Lipitor  10 A fib: Monitor HR tid--continue Xarelto. Resume metoprolol  11. HTN: Monitor BP tid--has been controlled without medications. Avoid hypoperfusion--out of 5 day window.   --Will resume metoprolol. Continue to hold Lasix, Diovan and Amlodipine--low dose due to SE)  12.   H/o gout: Managed on allopurinol.  13. T2DM: Hgb A1c-6.5--> is better per wife.    --Will monitor BS ac/hs. Add CM restrictions to diet. .  14. Vitamin B 12 deficiency: Resume supplement.  15. Left knee OA: Ice TID. Order neoprene knee sleeve for support. Quadriceps strengthening exercises.  16. Obesity (BMI 30.51): He will benefit from dietary counseling.  17. Chronic Phimosis: patient to continue to reduce nightly.    --Frequency managed with pads.  18. Disposition: plan is for d/c home with wife, who is a formed Therapist, sports. He has home wheelchair, grab bar, chair lift is being installed   Bary Leriche, PA-C 08/11/2020

## 2020-08-11 NOTE — Progress Notes (Signed)
Danise Edge  Rehab Admission Coordinator  Physical Medicine and Rehabilitation  PMR Pre-admission     Signed  Date of Service:  08/08/2020 1:33 PM      Related encounter: ED to Hosp-Admission (Discharged) from 08/05/2020 in [place]          Show:Clear all _0 Manual_1 Template_2 Copied  Added by: _3 Genella Mech, CCC-SLP   _4 Hover for details  PMR Admission Coordinator Pre-Admission Assessment  Patient: Shane Espinoza is an 80 y.o., male MRN: [id number] DOB: 1941/06/06 Height: _5  (175.3 cm) Weight: 93.7 kg                                                                                                                                                  Insurance Information HMO:     PPO: yes     PCP:      IPA:      80/20:      OTHER:  PRIMARY: [employer] Medicare       Policy#: [id number]      Subscriber: Pt.  CM Name: Beckie Busing      Phone#:  [phone]    Fax#: [phone]  I received authorization from [place] at [facility] on 2/8 for admission 2/7. Clinical updates are due 2/14. Pre-Cert#: [id number]      Employer:  Benefits:  Phone #: [phone]     Name:  Irene Shipper Date: 07/03/2020 - still active Deductible: does not have OOP Max: $4,500 ($221.96 met) CIR: $295/day co-pay with a max co-pay of $1,770/admission (6 days) SNF: 100% coverage Outpatient:  $35/visit co-pay; Home Health:  100% coverage DME: 80% coverage; 20% co-Insurance  SECONDARY: None      Policy#:       Phone#:   Financial Counselor:       Phone#:   The Therapist, art Information Summary" for patients in Inpatient Rehabilitation Facilities with attached "Privacy Act [place] Records" was provided and verbally reviewed with: Patient  Emergency Contact Information         Contact Information    Name Relation Home Work [place]el[place] Spouse [phone]  [phone]   Latrell, [place]em[place]  [phone]     Smith,Celeste Daughter [phone]       Current Medical History  Patient Admitting Diagnosis: CVA History of Present Illness: Shane Espinoza a 79 y.o.right-handed malewith history of anxiety, atrial fibrillation,CVA 2015,CAD with 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 Presented to 09/19/2020 with acute onset of left-sided weakness and lightheadedness. CT/MRI showed focus of restricted diffusion consistent with acute subacute infarct in the periventricular white matter adjacent  to the body of the right lateral ventricle. MRA unremarkable. Patient did not receive TPA. Echocardiogram with ejection fraction of 55 to 60% no wall motion abnormalities grade 1 diastolic dysfunction.. Carotid Dopplers with no ICA stenosis. Admission chemistries unremarkable except glucose 132, urinalysis positive nitrite. Neurology consulted currently maintained on aspirinas well as Xareltofor CVA prophylaxis. Tolerating a regular diet. Physical Medicine & Rehabilitation was consulted to assess candidacy for CIRgiven impaired mobility and ADLs.   Complete NIHSS TOTAL: 0 Glasgow Coma Scale Score: 15  Past Medical History      Past Medical History:  Diagnosis Date  . Anxiety   . Atrial fibrillation ([place])   . CAD (coronary artery disease)    [name]  . Gout   . Hypertension   . 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  Prior Rehab/Hospitalizations:  Has the patient had prior rehab or hospitalizations prior to admission? Yes  Has the  patient had major surgery during 100 days prior to admission? No  Current Medications   Current Facility-Administered Medications:  .   stroke: mapping our early stages of recovery book, , Does not apply, Once, Karmen Bongo, MD .  acetaminophen (TYLENOL) tablet 650 mg, 650 mg, Oral, Q4H PRN **OR** acetaminophen (TYLENOL) 160 MG/5ML solution 650 mg, 650 mg, Per Tube, Q4H PRN **OR** acetaminophen (TYLENOL) suppository 650 mg, 650 mg, Rectal, Q4H PRN, Karmen Bongo, MD .  allopurinol (ZYLOPRIM) tablet 100 mg, 100 mg, Oral, Daily, Karmen Bongo, MD, 100 mg at 08/10/20 0940 .  aspirin suppository 300 mg, 300 mg, Rectal, Daily **OR** aspirin tablet 325 mg, 325 mg, Oral, Daily, Karmen Bongo, MD, 325 mg at 08/10/20 0940 .  atorvastatin (LIPITOR) tablet 40 mg, 40 mg, Oral, QHS, Karmen Bongo, MD, 40 mg at 08/09/20 2222 .  cholecalciferol (VITAMIN D3) tablet 5,000 Units, 5,000 Units, Oral, Once per day on Mon Wed Fri, [place], Sylvester I, MD, 5,000 Units at 08/09/20 [phone] .  multivitamin (PROSIGHT) tablet 1 tablet, 1 tablet, Oral, Daily, Karmen Bongo, MD, 1 tablet at 08/10/20 0940 .  oxyCODONE-acetaminophen (PERCOCET/ROXICET) 5-325 MG per tablet 1 tablet, 1 tablet, Oral, Q6H PRN, Karmen Bongo, MD .  rivaroxaban Alveda Reasons) tablet 20 mg, 20 mg, Oral, Q supper, Karmen Bongo, MD, 20 mg at 08/09/20 1857 .  senna-docusate (Senokot-S) tablet 1 tablet, 1 tablet, Oral, QHS PRN, Karmen Bongo, MD .  vitamin B-12 (CYANOCOBALAMIN) tablet 1,000 mcg, 1,000 mcg, Oral, Daily, Dana Allan I, MD, 1,000 mcg at 08/10/20 0940  Patients Current Diet:     Diet Order                  Diet Heart Room service appropriate? Yes; Fluid consistency: Thin  Diet effective ____                  Precautions / Restrictions Precautions Precautions: Fall Precaution Comments: Rt [place] when OOB Other Brace: fall in January resulting in R ankle fx Restrictions Weight Bearing Restrictions:  Yes RLE Weight Bearing: Weight bearing as tolerated Other Position/Activity Restrictions: with [place]   Has the patient had 2 or more falls or a fall with injury in the past year?Yes  Prior Activity Level Community (5-7x/wk): Pt. was active int he community PTA  Prior Functional Level Prior Function Level of Independence: Independent with assistive device(s),Needs assistance (  wife "watches") Gait / Transfers Assistance Needed: recent fall; states because of bad knee ADL's / Homemaking Assistance Needed: up until fracture he was completing his ADLs independently; wife has been Orthoptist / Swallowing Assistance Needed: speech different from baseline  Self Care: Did the patient need help bathing, dressing, using the toilet or eating?  Independent  Indoor Mobility: Did the patient need assistance with walking from room to room (with or without device)? Independent  Stairs: Did the patient need assistance with internal or external stairs (with or without device)? Independent  Functional Cognition: Did the patient need help planning regular tasks such as shopping or remembering to take medications? [place] / Equipment Home Assistive Devices/Equipment: Environmental consultant (specify type) Home Equipment: Walker - 2 wheels,Wheelchair - manual,Shower seat - built in,Grab bars - tub/shower,Grab bars - toilet,Bedside commode,Cane - single point,Walker - 4 wheels  Prior Device Use: Indicate devices/aids used by the patient prior to current illness, exacerbation or injury? Walker  Current Functional Level Cognition  Arousal/Alertness: Awake/alert Overall Cognitive Status: Within Functional Limits for tasks assessed Current Attention Level: Selective Orientation Level: Oriented X4 Safety/Judgement: Decreased awareness of safety,Decreased awareness of deficits General Comments: Needs repetition at times to follow commands. Attention: Selective Selective  Attention: Appears intact Memory: Appears intact Awareness: Appears intact Problem Solving: Appears intact Safety/Judgment: Appears intact    Extremity Assessment (includes Sensation/Coordination)  Upper Extremity Assessment: LUE deficits/detail LUE Deficits / Details: decreased speed of rapid alternating movements: strength 4/5, decreased fine motor and gross motor coordination LUE Sensation: WNL LUE Coordination: decreased fine motor,decreased gross motor  Lower Extremity Assessment: RLE deficits/detail RLE Deficits / Details: Prior fracture - WBAT in [place]. Gross AROM is WFL at hip and knee. Gross strength 4+/5 in quads, hamstrings, hip flexors. RLE Sensation: WNL LLE Deficits / Details: Gross strength 4- to 4/5 in quads, hamstrings, and hip flexors. LLE Sensation: WNL    ADLs  Overall ADL's : Needs assistance/impaired Eating/Feeding: Set up Grooming: Maximal assistance,Standing Grooming Details (indicate cue type and reason): pt with left lateral lean while washing hands at sink, required maxA to stabilize Upper Body Bathing: Minimal assistance,Sitting Lower Body Bathing: Maximal assistance,Sit to/from stand Upper Body Dressing : Moderate assistance,Sitting Lower Body Dressing: Maximal assistance,Sit to/from stand Toilet Transfer: Moderate assistance,RW Toilet Transfer Details (indicate cue type and reason): pt ambulated 10 feet with minimal assistance initially then requiring moderate assistance due to fatigue, LLE weakness Toileting- Clothing Manipulation and Hygiene: Total assistance Toileting - Clothing Manipulation Details (indicate cue type and reason): incontinenet of urine/BM; states he is unsure when he has to go Functional mobility during ADLs: Rolling walker,Moderate assistance General ADL Comments: ambulated short distance in room;decreased activity tolerance and endurance;required seat at sink level and then squat pivot to return to recliner    Mobility   Overal bed mobility: Needs Assistance Bed Mobility: Rolling,Sidelying to Sit Rolling: Min guard Sidelying to sit: Min assist,HOB elevated Supine to sit: Mod assist,HOB elevated Sit to supine: Mod assist General bed mobility comments: Able to get to EOB with Min A to steady once upright due to posterior lean. Increased time/effort due to difficulty scooting LLE out    Transfers  Overall transfer level: Needs assistance Equipment used: Rolling walker (2 wheeled) Transfer via Lift Equipment: Stedy Transfers: Sit to/from Stand Sit to Stand: Mod assist,Min assist,Min guard Stand pivot transfers: Max assist,+2 physical assistance Squat pivot transfers: Mod assist General transfer comment: Initially mod A to stand from EOB wtih cues for hand placement,  use of momentum and cues for anterior weight shift as pt with retropulsion at times. Progressed to Min A and Min guard for a few reps from chair with cues for technique. Performed STS from chair x5 with focus on slow descent and proper technique to rise pushing through UEs.    Ambulation / Gait / Stairs / Wheelchair Mobility  Ambulation/Gait Ambulation/Gait assistance: Min assist,+2 safety/equipment Gait Distance (Feet): 100 Feet Assistive device: Rolling walker (2 wheeled) Gait Pattern/deviations: Step-to pattern,Step-through pattern,Decreased stance time - left,Decreased step length - right,Trunk flexed General Gait Details: Slow, mildly unsteady gait with left knee instability and cues for RW proximity/management and for forward gaze. Gait velocity: decreased Gait velocity interpretation: <1.31 ft/sec, indicative of household ambulator    Posture / Balance Dynamic Sitting Balance Sitting balance - Comments: Requires UE support sitting EOB, posterior lOB with dynamic activities EOB. Balance Overall balance assessment: Needs assistance,History of Falls Sitting-balance support: Feet supported,Single extremity supported Sitting  balance-Leahy Scale: Poor Sitting balance - Comments: Requires UE support sitting EOB, posterior lOB with dynamic activities EOB. Postural control: Posterior lean Standing balance support: During functional activity Standing balance-Leahy Scale: Poor Standing balance comment: Requires UE support in standing and cues for upright, posterior tendency.    Special needs/care consideration  Designated visitor [place] (from acute therapy documentation) Living Arrangements: Spouse/significant other  Lives With: Spouse Available Help at Discharge: Available 24 hours/day Type of Home: House Home Layout: Two level Alternate Level Stairs-Number of Steps: 13 steps (Has Stair lift) Home Access: Stairs to enter Entrance Stairs-Rails: Surveyor, mining of Steps: 3 Bathroom Shower/Tub: Multimedia programmer: Handicapped height Bathroom Accessibility: Yes How Accessible: Accessible via walker [place]: No  Discharge Living Setting Plans for Discharge Living Setting: House,Patient's home Type of Home at Discharge: House Discharge Home Layout: Other (Comment) (Has chair lift) Discharge Home Access: Stairs to enter Entrance Stairs-Rails: Right,Left Entrance Stairs-Number of Steps: 3 Discharge Bathroom Shower/Tub: Tub/shower unit Discharge Bathroom Toilet: Handicapped height Discharge Bathroom Accessibility: Yes How Accessible: Accessible via walker Does the patient have any problems obtaining your medications?: No  Social/Family/Support Systems Patient Roles: Spouse Contact Information: [phone] Anticipated Caregiver: Jermiah Soderman Anticipated Caregiver's Contact Information: [phone] Ability/Limitations of Caregiver: Can provide Min A Caregiver Availability: 24/7 Discharge Plan Discussed with Primary Caregiver: Yes Is Caregiver In Agreement with Plan?: Yes Does Caregiver/Family have Issues with  Lodging/Transportation while Pt is in Rehab?: No   Goals Patient/Family Goal for Rehab: PT/OT/SLP Supervision Expected length of stay: 12-14 days Pt/Family Agrees to Admission and willing to participate: Yes Program Orientation Provided & Reviewed with Pt/Caregiver Including Roles  & Responsibilities: Yes   Decrease burden of Care through IP rehab admission: Specialzed equipment needs, Decrease number of caregivers, Bowel and bladder program and Patient/family education   Possible need for SNF placement upon discharge:not anticipated   Patient Condition: This patient's condition remains as documented in the consult dated 08/09/2020, in which the Rehabilitation Physician determined and documented that the patient's condition is appropriate for intensive rehabilitative care in an inpatient rehabilitation facility. Will admit to inpatient rehab today.  Preadmission Screen Completed By:  Genella Mech, CCC-SLP, 08/10/2020 2:31 PM ______________________________________________________________________   Discussed status with Dr. Ranell Patrick on 08/11/20 at 9:30 am and received approval for admission today.  Admission Coordinator:  Genella Mech, time 1043 Sud[address] Grumbling 08/11/20

## 2020-08-11 NOTE — Progress Notes (Signed)
Inpatient Rehab Admissions Coordinator:   I have a bed on CIR for this Pt today and plan to admit  Him. RN may call report to [phone].  Clemens Catholic, [place], [place] Admissions Coordinator  [phone] ([place]) [phone] (office)

## 2020-08-12 DIAGNOSIS — I63231 Cerebral infarction due to unspecified occlusion or stenosis of right carotid arteries: Secondary | ICD-10-CM | POA: Diagnosis not present

## 2020-08-12 LAB — GLUCOSE, CAPILLARY
Glucose-Capillary: 120 mg/dL — ABNORMAL HIGH (ref 70–99)
Glucose-Capillary: 122 mg/dL — ABNORMAL HIGH (ref 70–99)
Glucose-Capillary: 113 mg/dL — ABNORMAL HIGH (ref 70–99)
Glucose-Capillary: 95 mg/dL (ref 70–99)

## 2020-08-12 LAB — CBC WITH DIFFERENTIAL/PLATELET
Abs Immature Granulocytes: 0.03 10*3/uL (ref 0.00–0.07)
Basophils Absolute: 0.1 10*3/uL (ref 0.0–0.1)
Basophils Relative: 1 %
Eosinophils Absolute: 0.6 10*3/uL — ABNORMAL HIGH (ref 0.0–0.5)
Eosinophils Relative: 7 %
HCT: 44.7 % (ref 39.0–52.0)
Hemoglobin: 14.3 g/dL (ref 13.0–17.0)
Immature Granulocytes: 0 %
Lymphocytes Relative: 34 %
Lymphs Abs: 3.1 10*3/uL (ref 0.7–4.0)
MCH: 29.9 pg (ref 26.0–34.0)
MCHC: 32 g/dL (ref 30.0–36.0)
MCV: 93.5 fL (ref 80.0–100.0)
Monocytes Absolute: 1 10*3/uL (ref 0.1–1.0)
Monocytes Relative: 11 %
Neutro Abs: 4.2 10*3/uL (ref 1.7–7.7)
Neutrophils Relative %: 47 %
Platelets: 180 10*3/uL (ref 150–400)
RBC: 4.78 MIL/uL (ref 4.22–5.81)
RDW: 13.3 % (ref 11.5–15.5)
WBC: 9 10*3/uL (ref 4.0–10.5)
nRBC: 0 % (ref 0.0–0.2)

## 2020-08-12 LAB — COMPREHENSIVE METABOLIC PANEL
ALT: 20 U/L (ref 0–44)
AST: 25 U/L (ref 15–41)
Albumin: 2.9 g/dL — ABNORMAL LOW (ref 3.5–5.0)
Alkaline Phosphatase: 64 U/L (ref 38–126)
Anion gap: 8 (ref 5–15)
BUN: 17 mg/dL (ref 8–23)
CO2: 25 mmol/L (ref 22–32)
Calcium: 9.1 mg/dL (ref 8.9–10.3)
Chloride: 105 mmol/L (ref 98–111)
Creatinine, Ser: 1.24 mg/dL (ref 0.61–1.24)
GFR, Estimated: 59 mL/min — ABNORMAL LOW (ref >60)
Glucose, Bld: 127 mg/dL — ABNORMAL HIGH (ref 70–99)
Potassium: 4.4 mmol/L (ref 3.5–5.1)
Sodium: 138 mmol/L (ref 135–145)
Total Bilirubin: 1.1 mg/dL (ref 0.3–1.2)
Total Protein: 6.1 g/dL — ABNORMAL LOW (ref 6.5–8.1)

## 2020-08-12 NOTE — Evaluation (Signed)
Physical Therapy Assessment and Plan  Patient Details  Name: Shane Espinoza MRN: [id number] Date of Birth: March 18, 1941  PT Diagnosis: Abnormal posture, Abnormality of gait, Coordination disorder, Difficulty walking, Hemiplegia non-dominant and Pain in joint Rehab Potential: Good ELOS: 10-14 days   Today's Date: 08/12/2020 PT Individual Time: 0915-1030 PT Individual Time Calculation (min): 75 min    Hospital Problem: Principal Problem:   Stroke (cerebrum) [facility])   Past Medical History:  Past Medical History:  Diagnosis Date  . Anxiety   . Atrial fibrillation ([place])   . Biceps tendon tear    right  . CAD (coronary artery disease)    [name]  . Gout   . Hypertension   . 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 CARDIAC CATHETERIZATION  03/02/2011   Recommended CABG  . COLON SURGERY    . CORONARY ARTERY BYPASS GRAFT  03/07/2011   DR.VAN  TRIGHT  . EYE SURGERY    . LEXISCAN MYOVIEW  02/20/2012   EKG negative for ischemia, noraml study  . TRANSTHORACIC ECHOCARDIOGRAM  02/20/2012   EF 50-55%, mild-moderate concentric LVH, LA moderately dilated, moderate mitral regurg  . VASCULAR DOPPLER  03/03/2011   Nosignificant extracranial carotid artery stenosis    Assessment & Plan Clinical Impression: Patient is a 80 y.o. year old male withhistory of CAF, CAD s/p CABG 2012, CVA 2015, A fib- on Xarelto,  fall 07/08/20 with nondisplaced right lateral malleolus and adjacent avulsion fracture of medial malleolus who developed left sided weakness and numbness with fall. UDS negative. LLE symptoms resolved and MRI head showed focus of restricted diffusion with acute/subacute infarct in periventricular white matter adjacent to right ventricle and incidental moderate parenchymal volume loss.  2D echo showed EF 55-60%, mild to moderate aortic valve sclerosis and grade 1 DD. Carotid dopplers were negative for significant  ICA stenosis. Dr. Leonie Man felt that stroke was due to small vessel disease and educated patient on importance of taking medications at same time with the largest meal of the day. Patient continues to require CAM boot on RLE--WBAT and now has resultant LUE weakness with instability, left lateral lean and delayed processing with decrease STM affecting ADLs and mobility. CIR recommended given left sided weakness and right sided ankle fracture..   Patient currently requires mod with mobility secondary to muscle weakness, decreased cardiorespiratoy endurance and decreased sitting balance, decreased standing balance, decreased postural control, hemiplegia and decreased balance strategies.  Prior to hospitalization, patient was modified independent  with mobility and lived with Spouse in a House home.  Home access is 3Stairs to enter.  Patient will benefit from skilled PT intervention to maximize safe functional mobility, minimize fall risk and decrease caregiver burden for planned discharge home with 24 hour assist.  Anticipate patient will benefit from follow up [facility] at discharge.  PT - End of Session Activity Tolerance: Tolerates 30+ min activity with multiple rests Endurance Deficit: Yes Endurance Deficit Description: req seated rest break and increased time to complete ADL PT Assessment Rehab Potential (ACUTE/IP ONLY): Good PT Barriers to Discharge: Home environment access/layout;Other (comments) PT Barriers to Discharge Comments: ankle fx + CVA PT Patient demonstrates impairments in the following area(s): Balance;Edema;Endurance;Motor;Pain;Safety;Sensory;Skin Integrity PT Transfers Functional Problem(s): Bed Mobility;Bed to Chair;Car;Furniture PT Locomotion Functional Problem(s): Ambulation;Stairs PT Plan PT Intensity: Minimum of 1-2 x/day ,45 to 90 minutes PT Frequency: 5 out of 7 days PT Duration Estimated Length of  Stay: 10-14 days PT Treatment/Interventions: Ambulation/gait training;Discharge  planning;Functional mobility training;Psychosocial support;Therapeutic Activities;Visual/perceptual remediation/compensation;Balance/vestibular training;Disease management/prevention;Neuromuscular re-education;Skin care/wound management;Therapeutic Exercise;Wheelchair propulsion/positioning;Cognitive remediation/compensation;DME/adaptive equipment instruction;Pain management;Splinting/orthotics;UE/LE Strength taining/ROM;Community reintegration;Functional electrical stimulation;Stair training;Patient/family education;UE/LE Coordination activities PT Transfers Anticipated Outcome(s): SPV PT Locomotion Anticipated Outcome(s): CGA PT Recommendation Recommendations for Other Services: Therapeutic Recreation consult Therapeutic Recreation Interventions: Stress management Follow Up Recommendations: Home health PT;Outpatient PT Patient destination: Home Equipment Details: owns  RW   PT Evaluation Precautions/Restrictions Precautions Precautions: Fall Precaution Comments: Rt [place] when OOB, RLE WBAT, R knee sleeve, L hemiparesis/lean Required Braces or Orthoses: Other Brace Other Brace: fall in January resulting in R ankle fx; Patient reports receiving "knee sleeve" to R knee to "help prevent knee buckling" Restrictions Weight Bearing Restrictions: Yes RLE Weight Bearing: Weight bearing as tolerated Other Position/Activity Restrictions: with [place] Home Living/Prior Functioning Home Living Living Arrangements: Spouse/significant other Available Help at Discharge: Available 24 hours/day Type of Home: House Home Access: Stairs to enter [employer] of Steps: 3 Entrance Stairs-Rails: Right;Left Home Layout: Two level Alternate Level Stairs-Number of Steps: 13 steps (chair lift) Bathroom Shower/Tub: Walk-in shower (with 3-4 inch lip) Bathroom Toilet: Handicapped height Bathroom Accessibility: Yes  Lives With: Spouse Prior Function Level of Independence: Independent with basic  ADLs;Independent with homemaking with ambulation;Requires assistive device for independence;Independent with transfers;Independent with gait  Able to Take Stairs?: Yes Driving: Yes 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 Praxis: Impaired Praxis Impairment Details: Engineer, production Overall Cognitive Status: Within Functional Limits for tasks assessed Arousal/Alertness: Awake/alert Orientation Level: Oriented X4 Attention: Sustained Sustained Attention: Appears intact Selective Attention: Appears intact Memory: Appears intact Immediate Memory Recall: Bed;Blue;Sock Memory Recall Sock: Without Cue Memory Recall Blue: Without Cue Memory Recall Bed: Without Cue Awareness: Appears intact Problem Solving: Appears intact Safety/Judgment: Appears intact Sensation Sensation Light Touch: Appears Intact Hot/Cold: Appears Intact Proprioception: Appears Intact Stereognosis: Appears Intact Coordination Gross Motor Movements are Fluid and Coordinated: No Fine Motor Movements are Fluid and Coordinated: No Coordination and Movement Description: Decreased time to complete L thumb-digit opposition, overshoots target Finger Nose Finger Test: Decreased time to complete Heel Shin Test: limited ROM/coordination R LE 9 Hole Peg Test: L hand: 47 secs, R hand: 37 secs Motor  Motor Motor: Hemiplegia;Abnormal tone;Abnormal postural alignment and control Motor - Skilled Clinical Observations: slight hemiparesis L   Trunk/Postural Assessment  Cervical Assessment Cervical Assessment: Within Functional Limits Thoracic Assessment Thoracic Assessment: Exceptions to [facility] (increased kyphosis) Lumbar Assessment Lumbar Assessment: Exceptions to [facility] (posterior pelvic tilt) Postural Control Postural Control: Deficits on evaluation Righting Reactions: delayed and inadequate Protective Responses: delayed and inadequate  Balance Balance Balance  Assessed: Yes Standardized Balance Assessment Standardized Balance Assessment: Berg Balance Test Berg Balance Test Sit to Stand: Needs moderate or maximal assist to stand Standing Unsupported: Unable to stand 30 seconds unassisted Sitting with Back Unsupported but Feet Supported on Floor or Stool: Able to sit 2 minutes under supervision Stand to Sit: Needs assistance to sit Transfers: Needs one person to assist Standing Unsupported with Eyes Closed: Needs help to keep from falling Standing Ubsupported with Feet Together: Needs help to attain position and unable to hold for 15 seconds From Standing, Reach Forward with Outstretched Arm: Loses balance while trying/requires external support From Standing Position, Pick up Object from Floor: Unable to try/needs assist to keep balance From Standing Position, Turn to Look Behind Over each Shoulder: Needs assist to keep from losing balance and falling Turn 360 Degrees: Needs assistance while turning Standing  Unsupported, Alternately Place Feet on Step/Stool: Needs assistance to keep from falling or unable to try Standing Unsupported, One Foot in Front: Loses balance while stepping or standing Standing on One Leg: Unable to try or needs assist to prevent fall Total Score: 4 Static Sitting Balance Static Sitting - Balance Support: Feet supported Static Sitting - Level of Assistance: 5: Stand by assistance Dynamic Sitting Balance Dynamic Sitting - Balance Support: Feet supported;During functional activity Dynamic Sitting - Level of Assistance: 4: Min assist Static Standing Balance Static Standing - Balance Support: Bilateral upper extremity supported Static Standing - Level of Assistance: 3: Mod assist Dynamic Standing Balance Dynamic Standing - Balance Support: Bilateral upper extremity supported;During functional activity Dynamic Standing - Level of Assistance: 3: Mod assist Extremity Assessment      RLE Assessment RLE Assessment:  Exceptions to [facility] Passive Range of Motion (PROM) Comments: limited in all 4 quadrants, but AROM noted Active Range of Motion (AROM) Comments: limited in all 4 quadrants, but AROM noted RLE Strength Right Hip Flexion: 3-/5 Right Hip ABduction: 3/5 Right Hip ADduction: 3+/5 Right Knee Flexion: 3-/5 Right Knee Extension: 3-/5 Right Ankle Dorsiflexion:  (deferred d/t ankle fx) Right Ankle Plantar Flexion:  (deferred d/t ankle fx) LLE Assessment LLE Assessment: Exceptions to [facility] LLE Strength Left Hip Flexion: 3-/5 Left Hip ABduction: 3/5 Left Hip ADduction: 3/5 Left Knee Flexion: 3-/5 Left Knee Extension: 3-/5 Left Ankle Dorsiflexion: 3-/5 Left Ankle Plantar Flexion: 3-/5  Care Tool Care Tool Bed Mobility Roll left and right activity   Roll left and right assist level: Minimal Assistance - Patient > 75%    Sit to lying activity   Sit to lying assist level: Moderate Assistance - Patient 50 - 74%    Lying to sitting edge of bed activity   Lying to sitting edge of bed assist level: Minimal Assistance - Patient > 75%     Care Tool Transfers Sit to stand transfer   Sit to stand assist level: Moderate Assistance - Patient 50 - 74%    Chair/bed transfer   Chair/bed transfer assist level: Moderate Assistance - Patient 50 - 74%     Toilet transfer   Assist Level: Moderate Assistance - Patient 50 - 74%    Car transfer   Car transfer assist level: Moderate Assistance - Patient 50 - 74%      Care Tool Locomotion Ambulation   Assist level: Moderate Assistance - Patient 50 - 74% Assistive device: Walker-rolling Max distance: 75  Walk 10 feet activity   Assist level: Moderate Assistance - Patient - 50 - 74%     Walk 50 feet with 2 turns activity   Assist level: Moderate Assistance - Patient - 50 - 74%    Walk 150 feet activity Walk 150 feet activity did not occur: Safety/medical concerns      Walk 10 feet on uneven surfaces activity Walk 10 feet on uneven surfaces activity  did not occur: Safety/medical concerns      Stairs   Assist level: Moderate Assistance - Patient - 50 - 74% Stairs assistive device: 1 hand rail Max number of stairs: 4  Walk up/down 1 step activity   Walk up/down 1 step (curb) assist level: Moderate Assistance - Patient - 50 - 74% Walk up/down 1 step or curb assistive device: 1 hand rail    Walk up/down 4 steps activity Walk up/down 4 steps assist level: Moderate Assistance - Patient - 50 - 74%    Walk up/down 12 steps activity  Walk up/down 12 steps activity did not occur: Safety/medical concerns      Pick up small objects from floor Pick up small object from the floor (from standing position) activity did not occur: Safety/medical concerns      Wheelchair Will patient use wheelchair at discharge?: No          Wheel 50 feet with 2 turns activity      Wheel 150 feet activity        Refer to Care Plan for Long Term Goals  SHORT TERM GOAL WEEK 1 PT Short Term Goal 1 (Week 1): Patient will completed sit <> supine transition with no more than CGA consistently PT Short Term Goal 2 (Week 1): Patient will complete sit <> stand with LRAD and no more than MinA consistently PT Short Term Goal 3 (Week 1): Patient will ambulate >75 with LRAD and MinA PT Short Term Goal 4 (Week 1): Patient will negotiate at least 3 steps with U HR use and MinA  Recommendations for other services: Therapeutic Recreation  Stress management  Skilled Therapeutic Intervention Mobility Bed Mobility Bed Mobility: Rolling Right;Rolling Left;Supine to Sit Rolling Right: Minimal Assistance - Patient > 75% Rolling Left: Minimal Assistance - Patient > 75% Supine to Sit: Minimal Assistance - Patient > 75% Transfers Transfers: Sit to Stand;Stand to Sit;Stand Pivot Transfers Sit to Stand: Moderate Assistance - Patient 50-74% Stand to Sit: Moderate Assistance - Patient 50-74% Stand Pivot Transfers: Moderate Assistance - Patient 50 - 74% Stand Pivot Transfer  Details: Verbal cues for sequencing;Verbal cues for technique;Verbal cues for precautions/safety;Tactile cues for posture;Tactile cues for weight shifting Transfer (Assistive device): Rolling walker Locomotion  Gait Ambulation: Yes Gait Assistance: Moderate Assistance - Patient 50-74% Gait Distance (Feet): 75 Feet Assistive device: Rolling walker Gait Assistance Details: Verbal cues for safe use of DME/AE;Manual facilitation for weight shifting;Verbal cues for precautions/safety;Verbal cues for technique;Verbal cues for sequencing Gait Gait: Yes Gait Pattern: Impaired Gait Pattern: Right circumduction;Step-to pattern;Decreased stance time - right;Trunk flexed;Narrow base of support;Poor foot clearance - right;Poor foot clearance - left;Decreased step length - left Gait velocity: decreased Stairs / Additional Locomotion Stairs: Yes Stairs Assistance: Moderate Assistance - Patient 50 - 74% Stair Management Technique: One rail Right Number of Stairs: 4 Height of Stairs: 6 Curb: Moderate Assistance - Patient 50 - 74% Wheelchair Mobility Wheelchair Mobility: No  Patient received supine in bed, agreeable to PT eval. Wife at bedside. He denies pain when asked. Patient requires grossly ModA for all functional mobility due to weakness related to CVA and deficits related to previous bimalleolar fx to R ankle. Patient with multiple LOB posteriorly when sitting edge of bed during MMT assessment with very little attempt at correcting balance independently. Patient with strong posterior bias during sit <>stand transition despite multimodal cues, which is consistent with both patients dx as well as heavy reliance on home lift-chair. Patient remaining up in wc at end of eval with seatbelt alarm on, call light within reach, wife at bedside.   Discharge Criteria: Patient will be discharged from PT if patient refuses treatment 3 consecutive times without medical reason, if treatment goals not met, if there is  a change in medical status, if patient makes no progress towards goals or if patient is discharged from hospital.  The above assessment, treatment plan, treatment alternatives and goals were discussed and mutually agreed upon: by patient and by family  Debbora Dus 08/12/2020, 12:59 PM

## 2020-08-12 NOTE — Progress Notes (Signed)
[facility] Individual Statement of Services  Patient Name:  Shane Espinoza  Date:  08/12/2020  Welcome to the [facility].  Our goal is to provide you with an individualized program based on your diagnosis and situation, designed to meet your specific needs.  With this comprehensive rehabilitation program, you will be expected to participate in at least 3 hours of rehabilitation therapies Monday-Friday, with modified therapy programming on the weekends.  Your rehabilitation program will include the following services:  Physical Therapy (PT), Occupational Therapy (OT), Speech Therapy (ST), 24 hour per day rehabilitation nursing, Therapeutic Recreaction (TR), Neuropsychology, Care Coordinator, Rehabilitation Medicine, Nutrition Services, Pharmacy Services and Other  Weekly team conferences will be held on Wednesdays to discuss your progress.  Your Inpatient Rehabilitation Care Coordinator will talk with you frequently to get your input and to update you on team discussions.  Team conferences with you and your family in attendance may also be held.  Expected length of stay: 10-12 Days  Overall anticipated outcome: Supervision  Depending on your progress and recovery, your program may change. Your Inpatient Rehabilitation Care Coordinator will coordinate services and will keep you informed of any changes. Your Inpatient Rehabilitation Care Coordinator's name and contact numbers are listed  below.  The following services may also be recommended but are not provided by the [facility]:    [place] will be made to provide these services after discharge if needed.  Arrangements include referral to agencies that provide these services.  Your insurance has been verified to be:  [employer] Your primary doctor is:  Josetta Huddle, MD  Pertinent  information will be shared with your doctor and your insurance company.  Inpatient Rehabilitation Care Coordinator:  Erlene Quan, [place] or [phone]  Information discussed with and copy given to patient by: [name], 08/12/2020, 9:57 AM

## 2020-08-12 NOTE — Evaluation (Signed)
Speech Language Pathology Assessment and Plan  Patient Details  Name: Shane Espinoza MRN: [id number] Date of Birth: October 11, 1940  SLP Diagnosis: Cognitive Impairments  Rehab Potential:   ELOS: N/A for ST    Today's Date: 08/12/2020 SLP Individual Time: 1400-1500 SLP Individual Time Calculation (min): 60 min   Hospital Problem: Principal Problem:   Stroke (cerebrum) [facility])  Past Medical History:  Past Medical History:  Diagnosis Date  . Anxiety   . Atrial fibrillation ([place])   . Biceps tendon tear    right  . CAD (coronary artery disease)    [name]  . Gout   . Hypertension   . 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 CARDIAC CATHETERIZATION  03/02/2011   Recommended CABG  . COLON SURGERY    . CORONARY ARTERY BYPASS GRAFT  03/07/2011   DR.VAN  TRIGHT  . EYE SURGERY    . LEXISCAN MYOVIEW  02/20/2012   EKG negative for ischemia, noraml study  . TRANSTHORACIC ECHOCARDIOGRAM  02/20/2012   EF 50-55%, mild-moderate concentric LVH, LA moderately dilated, moderate mitral regurg  . VASCULAR DOPPLER  03/03/2011   Nosignificant extracranial carotid artery stenosis    Assessment / Plan / Recommendation  Patient is a 80 y.o. year old male with history of CAF, CAD s/p CABG 2012, CVA 2015, A fib- on Xarelto, fall 07/08/20 with nondisplaced right lateral malleolus and adjacent avulsion fracture of medial malleolus who developed left sided weakness and numbness with fall. UDS negative. LLE symptoms resolved and MRI head showed focus of restricted diffusion with acute/subacute infarct in periventricular white matter adjacent to right ventricle and incidental moderate parenchymal volume loss. 2D echo showed EF 55-60%, mild to moderate aortic valve sclerosis and grade 1 DD. Carotid dopplers were negative for significant ICA stenosis. Dr. Leonie Man felt that stroke was due to small vessel disease and educated  patient on importance of taking medications at same time with the largest meal of the day. Patient continues to require CAM boot on RLE--WBAT and now has resultant LUE weakness with instability, left lateral lean and delayed processing with decrease STM affecting ADLs and mobility. CIR recommended given left sided weakness and right sided ankle fracture.Shane Espinoza is an 80 year old male with history of CAF, CAD s/p CABG 2012, CVA 2015, A fib- on Xarelto, fall 07/08/20 with nondisplaced right lateral malleolus and adjacent avulsion fracture of medial malleolus who developed left sided weakness and numbness with fall. UDS negative. LLE symptoms resolved and MRI head showed focus of restricted diffusion with acute/subacute infarct in periventricular white matter adjacent to right ventricle and incidental moderate parenchymal volume loss. 2D echo showed EF 55-60%, mild to moderate aortic valve sclerosis and grade 1 DD. Carotid dopplers were negative for significant ICA stenosis. Dr. Leonie Man felt that stroke was due to small vessel disease and educated patient on importance of taking medications at same time with the largest meal of the day. Patient continues to require CAM boot on RLE--WBAT and now has resultant LUE weakness with instability, left lateral lean and delayed processing with decrease STM affecting ADLs and mobility. CIR recommended given left sided weakness and right sided ankle fracture.  Patient transferred to CIR on 08/11/2020 .    Clinical Impression Patient presents with cognitive-linguistic abilities that are WFL-WNL. He received scores of 90% accuracy for ALFA (Assessment of Language Related Functional Activities) for subtests: Solving Daily Math  Problems and Reading Instructions and 100% accuracy for Writing a Check and Balancing a Checkbook. Awareness, reasoning, and memory all appeared WNL. SLP is not recommending formal ST services at this time.    Skilled Therapeutic Interventions           ALFA, cognitive-linguistic testing  SLP Assessment  Patient does not need any further Speech [place] Pathology Services    Recommendations  Patient destination: Home Follow up Recommendations: None Equipment Recommended: None recommended by SLP    SLP Frequency   N/A  SLP Duration  SLP Intensity  SLP Treatment/Interventions N/A for ST N/A    N/A    N/A   Pain Pain Assessment Pain Scale: 0-10 Pain Score: 0-No pain  Prior Functioning Cognitive/Linguistic Baseline: Within functional limits Type of Home: House  Lives With: Spouse Available Help at Discharge: Available 24 hours/day Education: 4 years college, 2 years law school Vocation: Retired  Programmer, systems Overall Cognitive Status: Within Functional Limits for tasks assessed  Comprehension Auditory Comprehension Overall Auditory Comprehension: Appears within functional limits for tasks assessed Expression Expression Primary Mode of Expression: Verbal Verbal Expression Overall Verbal Expression: Appears within functional limits for tasks assessed Oral Motor Oral Motor/Sensory Function Overall Oral Motor/Sensory Function: Within functional limits Motor Speech Overall Motor Speech: Appears within functional limits for tasks assessed  Care Tool Care Tool Cognition Expression of Ideas and Wants Expression of Ideas and Wants: Without difficulty (complex and basic) - expresses complex messages without difficulty and with speech that is clear and easy to understand   Understanding Verbal and Non-Verbal Content Understanding Verbal and Non-Verbal Content: Understands (complex and basic) - clear comprehension without cues or repetitions   Memory/Recall Ability *first 3 days only Memory/Recall Ability *first 3 days only: Current season;Location of own room;Staff names and faces;That he or she is in a hospital/hospital unit         Short Term Goals: No short term goals set  Refer to Care Plan  for Long Term Goals  Recommendations for other services: None   Discharge Criteria: Patient will be discharged from SLP if patient refuses treatment 3 consecutive times without medical reason, if treatment goals not met, if there is a change in medical status, if patient makes no progress towards goals or if patient is discharged from hospital.  The above assessment, treatment plan, treatment alternatives and goals were discussed and mutually agreed upon: by patient and by family  Sonia Baller, MA, CCC-SLP Speech Therapy

## 2020-08-12 NOTE — Progress Notes (Signed)
Inpatient Rehabilitation Care Coordinator Assessment and Plan Patient Details  Name: Shane Espinoza MRN: [id number] Date of Birth: January 15, 1941  Today's Date: 08/12/2020  Hospital Problems: Principal Problem:   Stroke (cerebrum) [facility])  Past Medical History:  Past Medical History:  Diagnosis Date  . Anxiety   . Atrial fibrillation ([place])   . Biceps tendon tear    right  . CAD (coronary artery disease)    [name]  . Gout   . Hypertension   . 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 CARDIAC CATHETERIZATION  03/02/2011   Recommended CABG  . COLON SURGERY    . CORONARY ARTERY BYPASS GRAFT  03/07/2011   DR.VAN  TRIGHT  . EYE SURGERY    . LEXISCAN MYOVIEW  02/20/2012   EKG negative for ischemia, noraml study  . TRANSTHORACIC ECHOCARDIOGRAM  02/20/2012   EF 50-55%, mild-moderate concentric LVH, LA moderately dilated, moderate mitral regurg  . VASCULAR DOPPLER  03/03/2011   Nosignificant extracranial carotid artery stenosis   Social History:  reports that he has never smoked. He has never used smokeless tobacco. He reports that he does not drink alcohol and does not use drugs.  Family / Support Systems Patient Roles: Spouse Spouse/Significant Other: [name] Children: Doctor, general practice (son), Public librarian (Daughter) Anticipated Caregiver: Matilde Sprang Ability/Limitations of Caregiver: Can Provide Min A Caregiver Availability: 24/7  Social History Preferred language: English Religion: Catholic Read: Yes Write: Yes Employment Status: Retired Insurance account manager: N/A   Abuse/Neglect Abuse/Neglect Assessment Can Be Completed: Yes Physical Abuse: Denies Verbal Abuse: Denies Sexual Abuse: Denies Exploitation of patient/patient's resources: Denies Self-Neglect: Denies  Emotional Status Pt's affect, behavior and adjustment status: no Recent Psychosocial Issues: no Psychiatric History: no Substance Abuse  History: no  Patient / Family Perceptions, Expectations & Goals Pt/Family understanding of illness & functional limitations: yes Premorbid pt/family roles/activities: Patient active in commmunity PTA. Independent W/ AD, Supervision from spouse. Since fall requiring assistance Anticipated changes in roles/activities/participation: Some assistance Pt/family expectations/goals: Supervision  [place] Agencies: None Premorbid Home Care/DME Agencies: Other (Comment) Librarian, academic, Radio broadcast assistant, Civil engineer, contracting, Education officer, environmental (tub/shower), [employer], [employer], Marketing executive) Transportation available at discharge: Family able to [employer] referrals recommended: Neuropsychology (Coping/hx of anxiety)  Discharge Planning Living Arrangements: Spouse/significant other Support Systems: Spouse/significant other,Children Type of Residence: Private residence (2 level home, 3 steps to enter. 13 steps to 2nd level (Has Market researcher)) Insurance Resources: Multimedia programmer (specify) Doctor, general practice) Financial Resources: Social Security Financial Screen Referred: No Living Expenses: Own Money Management: Patient,Spouse Does the patient have any problems obtaining your medications?: No Home Management: Previosuly independent Patient/Family Preliminary Plans: Assistance from spouse Care Coordinator Anticipated Follow Up Needs: HH/OP DC Planning Additional Notes/Comments: [place] when OOB, fall in January, weight barring as tolerated Expected length of stay: 12/14 Days  Clinical Impression Sw met with patient and spouse in room. Spouse retired Therapist, sports. Introduced self, explained role and addressed questions and concerns. SW will continue to follow up.   [name] 08/12/2020, 12:32 PM

## 2020-08-12 NOTE — Plan of Care (Signed)
Problem: RH Balance Goal: LTG: Patient will maintain dynamic sitting balance (OT) Description: LTG:  Patient will maintain dynamic sitting balance with assistance during activities of daily living (OT) Flowsheets (Taken 08/12/2020 1311) LTG: Pt will maintain dynamic sitting balance during ADLs with: Independent with assistive device Goal: LTG Patient will maintain dynamic standing with ADLs (OT) Description: LTG:  Patient will maintain dynamic standing balance with assist during activities of daily living (OT)  Flowsheets (Taken 08/12/2020 1311) LTG: Pt will maintain dynamic standing balance during ADLs with: Supervision/Verbal cueing   Problem: Sit to Stand Goal: LTG:  Patient will perform sit to stand in prep for activites of daily living with assistance level (OT) Description: LTG:  Patient will perform sit to stand in prep for activites of daily living with assistance level (OT) Flowsheets (Taken 08/12/2020 1311) LTG: PT will perform sit to stand in prep for activites of daily living with assistance level: Supervision/Verbal cueing   Problem: RH Eating Goal: LTG Patient will perform eating w/assist, cues/equip (OT) Description: LTG: Patient will perform eating with assist, with/without cues using equipment (OT) Flowsheets (Taken 08/12/2020 1311) LTG: Pt will perform eating with assistance level of: Independent   Problem: RH Grooming Goal: LTG Patient will perform grooming w/assist,cues/equip (OT) Description: LTG: Patient will perform grooming with assist, with/without cues using equipment (OT) Flowsheets (Taken 08/12/2020 1311) LTG: Pt will perform grooming with assistance level of: Independent with assistive device    Problem: RH Bathing Goal: LTG Patient will bathe all body parts with assist levels (OT) Description: LTG: Patient will bathe all body parts with assist levels (OT) Flowsheets (Taken 08/12/2020 1311) LTG: Pt will perform bathing with assistance level/cueing:  Supervision/Verbal cueing   Problem: RH Dressing Goal: LTG Patient will perform upper body dressing (OT) Description: LTG Patient will perform upper body dressing with assist, with/without cues (OT). Flowsheets (Taken 08/12/2020 1311) LTG: Pt will perform upper body dressing with assistance level of: Independent with assistive device Goal: LTG Patient will perform lower body dressing w/assist (OT) Description: LTG: Patient will perform lower body dressing with assist, with/without cues in positioning using equipment (OT) Flowsheets (Taken 08/12/2020 1311) LTG: Pt will perform lower body dressing with assistance level of: Minimal Assistance - Patient > 75%   Problem: RH Toileting Goal: LTG Patient will perform toileting task (3/3 steps) with assistance level (OT) Description: LTG: Patient will perform toileting task (3/3 steps) with assistance level (OT)  Flowsheets (Taken 08/12/2020 1311) LTG: Pt will perform toileting task (3/3 steps) with assistance level: Supervision/Verbal cueing   Problem: RH Functional Use of Upper Extremity Goal: LTG Patient will use RT/LT upper extremity as a (OT) Description: LTG: Patient will use right/left upper extremity as a stabilizer/gross assist/diminished/nondominant/dominant level with assist, with/without cues during functional activity (OT) Flowsheets (Taken 08/12/2020 1311) LTG: Use of upper extremity in functional activities: LUE as nondominant level LTG: Pt will use upper extremity in functional activity with assistance level of: Independent with assistive device   Problem: RH Toilet Transfers Goal: LTG Patient will perform toilet transfers w/assist (OT) Description: LTG: Patient will perform toilet transfers with assist, with/without cues using equipment (OT) Flowsheets (Taken 08/12/2020 1311) LTG: Pt will perform toilet transfers with assistance level of: Supervision/Verbal cueing   Problem: RH Tub/Shower Transfers Goal: LTG Patient will perform  tub/shower transfers w/assist (OT) Description: LTG: Patient will perform tub/shower transfers with assist, with/without cues using equipment (OT) Flowsheets (Taken 08/12/2020 1311) LTG: Pt will perform tub/shower stall transfers with assistance level of: Supervision/Verbal cueing

## 2020-08-12 NOTE — Evaluation (Signed)
Occupational Therapy Assessment and Plan  Patient Details  Name: KIPPY GOHMAN MRN: [id number] Date of Birth: 13-Dec-1940  OT Diagnosis: abnormal posture, hemiplegia affecting non-dominant side, muscle weakness (generalized) and decreased activity tolerance, impaired motor planning Rehab Potential: Rehab Potential (ACUTE ONLY): Good ELOS: 10 to 13 days   Today's Date: 08/12/2020 OT Individual Time: 6433-2951 OT Individual Time Calculation (min): 55 min     Hospital Problem: Principal Problem:   Stroke (cerebrum) [facility])   Past Medical History:  Past Medical History:  Diagnosis Date  . Anxiety   . Atrial fibrillation ([place])   . Biceps tendon tear    right  . CAD (coronary artery disease)    [name]  . Gout   . Hypertension   . 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 CARDIAC CATHETERIZATION  03/02/2011   Recommended CABG  . COLON SURGERY    . CORONARY ARTERY BYPASS GRAFT  03/07/2011   DR.VAN  TRIGHT  . EYE SURGERY    . LEXISCAN MYOVIEW  02/20/2012   EKG negative for ischemia, noraml study  . TRANSTHORACIC ECHOCARDIOGRAM  02/20/2012   EF 50-55%, mild-moderate concentric LVH, LA moderately dilated, moderate mitral regurg  . VASCULAR DOPPLER  03/03/2011   Nosignificant extracranial carotid artery stenosis    Assessment & Plan Clinical Impression: Patient is a 80 y.o. year old male with history of CAF, CAD s/p CABG 2012, CVA 2015, A fib- on Xarelto,  fall 07/08/20 with nondisplaced right lateral malleolus and adjacent avulsion fracture of medial malleolus who developed left sided weakness and numbness with fall. UDS negative. LLE symptoms resolved and MRI head showed focus of restricted diffusion with acute/subacute infarct in periventricular white matter adjacent to right ventricle and incidental moderate parenchymal volume loss.  2D echo showed EF 55-60%, mild to moderate aortic valve sclerosis  and grade 1 DD. Carotid dopplers were negative for significant ICA stenosis. Dr. Leonie Man felt that stroke was due to small vessel disease and educated patient on importance of taking medications at same time with the largest meal of the day. Patient continues to require CAM boot on RLE--WBAT and now has resultant LUE weakness with instability, left lateral lean and delayed processing with decrease STM affecting ADLs and mobility. CIR recommended given left sided weakness and right sided ankle fracture. Gavinn F. Staples is an 80 year old male with history of CAF, CAD s/p CABG 2012, CVA 2015, A fib- on Xarelto,  fall 07/08/20 with nondisplaced right lateral malleolus and adjacent avulsion fracture of medial malleolus who developed left sided weakness and numbness with fall. UDS negative. LLE symptoms resolved and MRI head showed focus of restricted diffusion with acute/subacute infarct in periventricular white matter adjacent to right ventricle and incidental moderate parenchymal volume loss.  2D echo showed EF 55-60%, mild to moderate aortic valve sclerosis and grade 1 DD. Carotid dopplers were negative for significant ICA stenosis. Dr. Leonie Man felt that stroke was due to small vessel disease and educated patient on importance of taking medications at same time with the largest meal of the day. Patient continues to require CAM boot on RLE--WBAT and now has resultant LUE weakness with instability, left lateral lean and delayed processing with decrease STM affecting ADLs and mobility. CIR recommended given left sided weakness and right sided ankle fracture.  Patient transferred to CIR on 08/11/2020 .    Patient currently requires mod with basic self-care  skills secondary to muscle weakness, decreased cardiorespiratoy endurance and impaired timing and sequencing, decreased coordination and decreased motor planning.  Prior to hospitalization, patient could complete ADL with independent .  Patient will benefit from  skilled intervention to increase independence with basic self-care skills prior to discharge home with care partner.  Anticipate patient will require intermittent supervision and minimal physical assistance and follow up home health.  OT - End of Session Activity Tolerance: Tolerates 10 - 20 min activity with multiple rests Endurance Deficit: Yes Endurance Deficit Description: req seated rest break and increased time to complete ADL OT Assessment Rehab Potential (ACUTE ONLY): Good OT Patient demonstrates impairments in the following area(s): Balance;Endurance;Motor OT Basic ADL's Functional Problem(s): Eating;Grooming;Bathing;Dressing;Toileting OT Transfers Functional Problem(s): Toilet;Tub/Shower OT Additional Impairment(s): Fuctional Use of Upper Extremity OT Plan OT Intensity: Minimum of 1-2 x/day, 45 to 90 minutes OT Frequency: 5 out of 7 days OT Duration/Estimated Length of Stay: 10 to 13 days OT Treatment/Interventions: Balance/vestibular training;Disease mangement/prevention;Neuromuscular re-education;Self Care/advanced ADL retraining;Therapeutic Exercise;Wheelchair propulsion/positioning;DME/adaptive equipment instruction;Pain management;Skin care/wound managment;UE/LE Strength taining/ROM;Patient/family education;Community reintegration;UE/LE Coordination activities;Discharge planning;Functional mobility training;Psychosocial support;Therapeutic Activities OT Self Feeding Anticipated Outcome(s): ind OT Basic Self-Care Anticipated Outcome(s): mod I OT Toileting Anticipated Outcome(s): mod I OT Bathroom Transfers Anticipated Outcome(s): close S OT Recommendation Patient destination: Home Follow Up Recommendations: Home health OT Equipment Recommended: To be determined   OT Evaluation Precautions/Restrictions  Precautions Precautions: Fall Precaution Comments: Rt [place] when OOB, RLE WBAT, R knee sleeve, L hemiparesis/lean Required Braces or Orthoses: Other Brace Other Brace:  fall in January resulting in R ankle fx; Patient reports receiving "knee sleeve" to R knee to "help prevent knee buckling" Restrictions Weight Bearing Restrictions: Yes RLE Weight Bearing: Weight bearing as tolerated Other Position/Activity Restrictions: with [place] Pain Pain Assessment Pain Scale: 0-10 Pain Score: 0-No pain Home Living/Prior Functioning Home Living Family/patient expects to be discharged to:: Private residence Living Arrangements: Spouse/significant other Available Help at Discharge: Available 24 hours/day Type of Home: House Home Access: Stairs to enter Technical brewer of Steps: 3 Entrance Stairs-Rails: Right,Left Home Layout: Two level Alternate Level Stairs-Number of Steps: 13 steps (chair lift) Bathroom Shower/Tub: Walk-in shower (with 3-4 inch lip) Bathroom Toilet: Handicapped height Bathroom Accessibility: Yes  Lives With: Spouse IADL History Homemaking Responsibilities: No Current License: Yes (chooses not to drive) Occupation: Retired Prior Function Level of Independence: Independent with basic ADLs,Independent with homemaking with ambulation,Requires assistive device for independence,Independent with transfers,Independent with gait  Able to Take Stairs?: Yes Driving: Yes Vocation: Retired Surveyor, mining Baseline Vision/History: Wears glasses Wears Glasses: Reading only Patient Visual Report: No change from baseline Vision Assessment?: No apparent visual deficits Perception  Perception: Within Functional Limits Praxis Praxis: Impaired Praxis Impairment Details: Motor planning Cognition Overall Cognitive Status: Within Functional Limits for tasks assessed Arousal/Alertness: Awake/alert Orientation Level: Person;Place;Situation Person: Oriented Place: Oriented Situation: Oriented Year: 2022 Month: February Day of Week: Correct Memory: Appears intact Immediate Memory Recall: Bed;Blue;Sock Memory Recall Sock: Without Cue Memory Recall  Blue: Without Cue Memory Recall Bed: Without Cue Attention: Sustained Sustained Attention: Appears intact Selective Attention: Appears intact Awareness: Appears intact Problem Solving: Appears intact Safety/Judgment: Appears intact Sensation Sensation Light Touch: Appears Intact Hot/Cold: Appears Intact Proprioception: Appears Intact Stereognosis: Appears Intact Coordination Gross Motor Movements are Fluid and Coordinated: No Fine Motor Movements are Fluid and Coordinated: No Coordination and Movement Description: Decreased time to complete L thumb-digit opposition, overshoots target Finger Nose Finger Test: Decreased time to complete Heel Shin Test: limited ROM/coordination R LE 9 Hole  Peg Test: L hand: 47 secs, R hand: 37 secs Motor  Motor Motor: Hemiplegia;Abnormal tone;Abnormal postural alignment and control Motor - Skilled Clinical Observations: slight hemiparesis L  Trunk/Postural Assessment  Cervical Assessment Cervical Assessment: Within Functional Limits Thoracic Assessment Thoracic Assessment: Exceptions to [facility] (rounded shoulders) Lumbar Assessment Lumbar Assessment: Exceptions to [facility] (posterior pelvic tilt) Postural Control Postural Control: Deficits on evaluation (dcreased in static standing)  Balance Balance Balance Assessed: Yes Static Sitting Balance Static Sitting - Balance Support: Feet supported Static Sitting - Level of Assistance: 5: Stand by assistance (distant S, supported sitting) Dynamic Sitting Balance Dynamic Sitting - Balance Support: Feet supported Dynamic Sitting - Level of Assistance: 5: Stand by assistance (close S, supported) Static Standing Balance Static Standing - Balance Support: Bilateral upper extremity supported Static Standing - Level of Assistance: 3: Mod assist Dynamic Standing Balance Dynamic Standing - Balance Support: Bilateral upper extremity supported Dynamic Standing - Level of Assistance: 3: Mod assist Extremity/Trunk  Assessment RUE Assessment RUE Assessment: Within Functional Limits Active Range of Motion (AROM) Comments: 3/4 to full in shoulder flexion General Strength Comments: 5/5 in shoulder flexion, 59 lbs grip strength LUE Assessment LUE Assessment: Exceptions to [facility] Active Range of Motion (AROM) Comments: 3/4 to full shoulder flexion General Strength Comments: mild hemi paresis, 4+/5 in shoulder flexion, 44 lb grip strenth LUE Body System: Neuro Brunstrum levels for arm and hand: Arm;Hand Brunstrum level for arm: Stage V Relative Independence from Synergy Brunstrum level for hand: Stage V Independence from basic synergies  Care Tool Care Tool Self Care Eating   Eating Assist Level: Set up assist    Oral Care    Oral Care Assist Level: Moderate Assistance - Patient 50 - 74% (standing)    Bathing   Body parts bathed by patient: Right arm;Left arm;Front perineal area;Abdomen;Chest;Right upper leg;Left upper leg Body parts bathed by helper: Buttocks;Right lower leg;Left lower leg   Assist Level: Moderate Assistance - Patient 50 - 74%    Upper Body Dressing(including orthotics)   What is the patient wearing?: Pull over shirt   Assist Level: Supervision/Verbal cueing    Lower Body Dressing (excluding footwear)   What is the patient wearing?: Pants Assist for lower body dressing: Maximal Assistance - Patient 25 - 49%    Putting on/Taking off footwear   What is the patient wearing?: Shoes;Socks Assist for footwear: Total Assistance - Patient < 25%       Care Tool Toileting Toileting activity   Assist for toileting: Minimal Assistance - Patient > 75%     Care Tool Bed Mobility Roll left and right activity   Roll left and right assist level: Minimal Assistance - Patient > 75%    Sit to lying activity   Sit to lying assist level: Moderate Assistance - Patient 50 - 74%    Lying to sitting edge of bed activity   Lying to sitting edge of bed assist level: Minimal Assistance -  Patient > 75%     Care Tool Transfers Sit to stand transfer   Sit to stand assist level: Moderate Assistance - Patient 50 - 74%    Chair/bed transfer   Chair/bed transfer assist level: Moderate Assistance - Patient 50 - 74%     Toilet transfer   Assist Level: Moderate Assistance - Patient 50 - 74%     Care Tool Cognition Expression of Ideas and Wants Expression of Ideas and Wants: Without difficulty (complex and basic) - expresses complex messages without difficulty and with speech  that is clear and easy to understand   Understanding Verbal and Non-Verbal Content Understanding Verbal and Non-Verbal Content: Understands (complex and basic) - clear comprehension without cues or repetitions   Memory/Recall Ability *first 3 days only Memory/Recall Ability *first 3 days only: Current season;Staff names and faces;That he or she is in a hospital/hospital unit    Refer to Care Plan for [place] 1 OT Short Term Goal 1 (Week 1): Pt will complete toilet transfer with CGA + AE PRN OT Short Term Goal 2 (Week 1): Pt will don pants with min A + AE PRN. OT Short Term Goal 3 (Week 1): Pt will stand to complete grooming task for at least 2 min with min A  Recommendations for other services: None    Skilled Therapeutic Intervention ADL ADL Eating: Set up Grooming: Moderate assistance (standing) Where Assessed-Grooming: Standing at sink Upper Body Bathing: Supervision/safety Where Assessed-Upper Body Bathing: Wheelchair Lower Body Bathing: Moderate assistance Where Assessed-Lower Body Bathing: Wheelchair Upper Body Dressing: Supervision/safety Where Assessed-Upper Body Dressing: Wheelchair Lower Body Dressing: Maximal assistance Where Assessed-Lower Body Dressing: Wheelchair Toileting: Minimal assistance Where Assessed-Toileting: Glass blower/designer: Moderate assistance Toilet Transfer Method: Stand pivot Toilet Transfer Equipment: Bedside commode;Grab  bars Social research officer, government: Moderate assistance Social research officer, government Method: Radiographer, therapeutic: Transfer tub bench;Grab bars Mobility  Transfers Sit to Stand: Moderate Assistance - Patient 50-74% Stand to Sit: Moderate Assistance - Patient 50-74%   Discharge Criteria: Patient will be discharged from OT if patient refuses treatment 3 consecutive times without medical reason, if treatment goals not met, if there is a change in medical status, if patient makes no progress towards goals or if patient is discharged from hospital.  The above assessment, treatment plan, treatment alternatives and goals were discussed and mutually agreed upon: by patient and by family   Session Note: Pt received in w/c with wife present, agreeable to OT eval. Denies pain this session. Reviewed role of CIR OT, evaluation process, ADL/func mobility retraining, goals for therapy, and safety plan. Evaluation completed as documented above with session focus on dressing, simulated bathing/toileting, and toilet transfer. Pt initially attempting STS from w/c with RW, unable to come into fully erect posture 2/2 limited forward trunk flexion, req mod A from therapist with no AD. Pt reports he primarily sits in his lift chair throughout the day. Pt reports that his wife already assisted him with a sponge bath and dressing this date, agreeable to simulate/re-do tasks. Doffed/donned shirt with close S. Doffed/donned pants with max A to thread BLE, pull over B hips in standing. Total A at baseline to don/doff shoes. Req mod A during bathing simulation to bathe BLE, close S to bathe UB. Toilet transfer from w/c to [facility] over toilet with overall mod A to complete STS + use of R grab bar. Pt req min A to pull up pants during LB clothing management. Administered 9HPT and hand dynameter as documented above. Pt left in w/c with safety belt alarm engaged, wife present, call bell in reach, and all immediate needs met.    Volanda Napoleon MS, OTR/L  08/12/2020, 1:10 PM

## 2020-08-12 NOTE — Progress Notes (Addendum)
PHYSICAL MEDICINE & REHABILITATION PROGRESS NOTE   Subjective/Complaints: Received neoprene knee sleeve for right knee. Discussed benefits of weight loss for less knee buckling and dietary changes he can make.  Denies right ankle pain  ROS: + right knee pain  Objective:   No results found. Recent Labs    08/10/20 0321 08/12/20 0528  WBC 9.1 9.0  HGB 14.4 14.3  HCT 44.4 44.7  PLT 179 180   Recent Labs    08/10/20 0321 08/12/20 0528  NA 138 138  K 4.3 4.4  CL 106 105  CO2 23 25  GLUCOSE 113* 127*  BUN 16 17  CREATININE 0.96 1.24  CALCIUM 8.9 9.1    Intake/Output Summary (Last 24 hours) at 08/12/2020 1033 Last data filed at 08/12/2020 1610 Gross per 24 hour  Intake 220 ml  Output 425 ml  Net -205 ml        Physical Exam: Vital Signs Blood pressure 132/70, pulse 67, temperature 98.6 F (37 C), temperature source Oral, resp. rate 16, height 5\' 9"  (1.753 m), weight 97.7 kg, SpO2 96 %. Gen: no distress, normal appearing HEENT: oral mucosa pink and moist, NCAT Cardio: Rhythm irregular.  Chest: normal effort, normal rate of breathing Abd: soft, non-distended Ext: no edema Psych: pleasant, normal affect Skin: intact Neuro: Speech clear. Able to follow simple one step commands. Left sided weakness noted.  4/5 throughout left side. Right foot in boot.   Assessment/Plan: 1. Functional deficits which require 3+ hours per day of interdisciplinary therapy in a comprehensive inpatient rehab setting.  Physiatrist is providing close team supervision and 24 hour management of active medical problems listed below.  Physiatrist and rehab team continue to assess barriers to discharge/monitor patient progress toward functional and medical goals  Care Tool:  Bathing              Bathing assist       Upper Body Dressing/Undressing Upper body dressing   What is the patient wearing?: Hospital gown only    Upper body assist Assist Level: Moderate  Assistance - Patient 50 - 74%    Lower Body Dressing/Undressing Lower body dressing      What is the patient wearing?: Incontinence brief     Lower body assist Assist for lower body dressing: Moderate Assistance - Patient 50 - 74%     Toileting Toileting    Toileting assist Assist for toileting: Minimal Assistance - Patient > 75% (urinal)     Transfers Chair/bed transfer  Transfers assist           Locomotion Ambulation   Ambulation assist              Walk 10 feet activity   Assist           Walk 50 feet activity   Assist           Walk 150 feet activity   Assist           Walk 10 feet on uneven surface  activity   Assist           Wheelchair     Assist               Wheelchair 50 feet with 2 turns activity    Assist            Wheelchair 150 feet activity     Assist          Blood pressure 132/70, pulse 67, temperature  98.6 F (37 C), temperature source Oral, resp. rate 16, height 5\' 9"  (1.753 m), weight 97.7 kg, SpO2 96 %.  Medical Problem List and Plan: 1.  Impaired mobility and ADLs secondary to right sided acute CVA and right sided ankle fracture             -patient may shower             -ELOS/Goals: 10-14 days modI   -Initial CIR evals today.  2.  Antithrombotics: -DVT/anticoagulation:  Pharmaceutical: Xarelto             -antiplatelet therapy: N/A 3. Pain Management: Oxycodone prn. Not using: discuss d/cing.  4. Mood: LCSW to follow for evaluation and support.              -antipsychotic agents: N/A 5. Neuropsych: This patient is capable of making decisions on his own behalf. 6. Skin/Wound Care: Routine pressure relief measures.  7. Fluids/Electrolytes/Nutrition: Monitor I/O. CMET reviewed and stable 8. Right ankle Fx: WBAT with CAM boot. Discussed wife, his surgeon is Dr. Stann Mainland. He is out of office until Monday. We can call him on Monday to discuss plan- he is currently pain  free and continuing strengthening exercises. Injury was on Jan 5th.  9. CAD s/p CABG: On Lipitor  10 A fib: Monitor HR tid--continue Xarelto. Resume metoprolol  11. HTN: Monitor BP tid--has been controlled without medications. Avoid hypoperfusion--out of 5 day window.              --Well controlled: continue metoprolol. Continue to hold Lasix, Diovan and Amlodipine--low dose due to SE)  12.   H/o gout: Managed on allopurinol.  13. T2DM: Hgb A1c-6.5--> is better per wife.               --Will monitor BS ac/hs. Add CM restrictions to diet. .  14. Vitamin B 12 deficiency: Resume supplement.  15. Left knee OA: Ice TID. He has received neoprene knee sleeve.  Quadriceps strengthening exercises.  16. Obesity (BMI 30.51): He will benefit from dietary counseling.  17. Chronic Phimosis: patient to continue to reduce nightly.              --Frequency managed with pads.  18. Disposition: plan is for d/c home with wife, who is a former Therapist, sports. He has home wheelchair, grab bar, chair lift is being installed    LOS: 1 days [place] 08/12/2020, 10:33 AM

## 2020-08-13 LAB — GLUCOSE, CAPILLARY
Glucose-Capillary: 113 mg/dL — ABNORMAL HIGH (ref 70–99)
Glucose-Capillary: 121 mg/dL — ABNORMAL HIGH (ref 70–99)
Glucose-Capillary: 98 mg/dL (ref 70–99)
Glucose-Capillary: 157 mg/dL — ABNORMAL HIGH (ref 70–99)

## 2020-08-13 MED ORDER — METFORMIN HCL 500 MG PO TABS
250.0000 mg | ORAL_TABLET | Freq: Every day | ORAL | Status: DC
  Administered 2020-08-14 – 2020-08-26 (×13): 250 mg via ORAL
  Filled 2020-08-13 (×13): qty 1

## 2020-08-13 MED ORDER — LIVING WELL WITH DIABETES BOOK
Freq: Once | Status: AC
  Administered 2020-08-13: 1
  Filled 2020-08-13: qty 1

## 2020-08-13 MED ORDER — EXERCISE FOR HEART AND HEALTH BOOK
Freq: Once | Status: AC
  Administered 2020-08-13: 1
  Filled 2020-08-13: qty 1

## 2020-08-13 NOTE — Progress Notes (Signed)
Patient ID: Shane Espinoza, male   DOB: 05/25/1941, 79 y.o.   MRN: [id number] Met with the patient and wife to review role of the nurse CM and address educational needs related to secondary stroke risks including HTN, T2DM and CAD/A-fib. Discussed DASH and CMM diet and exercise. Given handouts on DASH diet, SVD, A-fib and relationship with strokes along with tips for diet modifications. Continue to follow along to discharge to address questions/concerns. Sharp, Deborah B  

## 2020-08-13 NOTE — Progress Notes (Addendum)
[place] PHYSICAL MEDICINE & REHABILITATION PROGRESS NOTE   Subjective/Complaints: No complaints this morning. He felt therapy went well yesterday.  Icing right knee. Discussed HbA1c from 2/4 and dietary recommendations  ROS: + right knee pain  Objective:   No results found. Recent Labs    08/12/20 0528  WBC 9.0  HGB 14.3  HCT 44.7  PLT 180   Recent Labs    08/12/20 0528  NA 138  K 4.4  CL 105  CO2 25  GLUCOSE 127*  BUN 17  CREATININE 1.24  CALCIUM 9.1    Intake/Output Summary (Last 24 hours) at 08/13/2020 8937 Last data filed at 08/12/2020 2129 Gross per 24 hour  Intake 360 ml  Output 450 ml  Net -90 ml        Physical Exam: Vital Signs Blood pressure 135/88, pulse 78, temperature 98 F (36.7 C), temperature source Oral, resp. rate 18, height 5\' 9"  (1.753 m), weight 97.7 kg, SpO2 95 %.  Gen: no distress, normal appearing HEENT: oral mucosa pink and moist, NCAT Cardio: Rhythm irregular. Chest: normal effort, normal rate of breathing Abd: soft, non-distended Ext: no edema Psych: pleasant, normal affect Skin: intact Neuro: Speech clear. Able to follow simple one step commands. Left sided weakness noted.  4/5 throughout left leg, strength is otherwise 5/5, including DF/PF/eversion/inversion of right foot (with ankle fracture).    Assessment/Plan: 1. Functional deficits which require 3+ hours per day of interdisciplinary therapy in a comprehensive inpatient rehab setting.  Physiatrist is providing close team supervision and 24 hour management of active medical problems listed below.  Physiatrist and rehab team continue to assess barriers to discharge/monitor patient progress toward functional and medical goals  Care Tool:  Bathing    Body parts bathed by patient: Right arm,Left arm,Front perineal area,Abdomen,Chest,Right upper leg,Left upper leg   Body parts bathed by helper: Buttocks,Right lower leg,Left lower leg     Bathing assist Assist  Level: Moderate Assistance - Patient 50 - 74%     Upper Body Dressing/Undressing Upper body dressing   What is the patient wearing?: Pull over shirt    Upper body assist Assist Level: Supervision/Verbal cueing    Lower Body Dressing/Undressing Lower body dressing      What is the patient wearing?: Pants     Lower body assist Assist for lower body dressing: Maximal Assistance - Patient 25 - 49%     Toileting Toileting    Toileting assist Assist for toileting: Set up assist     Transfers Chair/bed transfer  Transfers assist     Chair/bed transfer assist level: Moderate Assistance - Patient 50 - 74%     Locomotion Ambulation   Ambulation assist      Assist level: Moderate Assistance - Patient 50 - 74% Assistive device: Walker-rolling Max distance: 75   Walk 10 feet activity   Assist     Assist level: Moderate Assistance - Patient - 50 - 74% Assistive device: Walker-rolling   Walk 50 feet activity   Assist    Assist level: Moderate Assistance - Patient - 50 - 74% Assistive device: Walker-rolling    Walk 150 feet activity   Assist Walk 150 feet activity did not occur: Safety/medical concerns         Walk 10 feet on uneven surface  activity   Assist Walk 10 feet on uneven surfaces activity did not occur: Safety/medical concerns         Wheelchair     Assist Will patient use  wheelchair at discharge?: No             Wheelchair 50 feet with 2 turns activity    Assist            Wheelchair 150 feet activity     Assist          Blood pressure 135/88, pulse 78, temperature 98 F (36.7 C), temperature source Oral, resp. rate 18, height 5\' 9"  (1.753 m), weight 97.7 kg, SpO2 95 %.  Medical Problem List and Plan: 1.  Impaired mobility and ADLs secondary to right sided acute CVA and right sided ankle fracture             -patient may shower             -ELOS/Goals: 10-14 days modI   Continue CIR 2.   Antithrombotics: -DVT/anticoagulation:  Pharmaceutical: Xarelto             -antiplatelet therapy: N/A 3. Pain Management: Continue icing right knee. D/c oxycodone- not requiring 4. Mood: LCSW to follow for evaluation and support.              -antipsychotic agents: N/A 5. Neuropsych: This patient is capable of making decisions on his own behalf. 6. Skin/Wound Care: Routine pressure relief measures.  7. Fluids/Electrolytes/Nutrition: Monitor I/O. CMET reviewed and stable 8. Right ankle Fx: WBAT with CAM boot. Discussed wife, his surgeon is Dr. Stann Mainland. He is out of office until Monday. We can call him on Monday to discuss plan- he is currently pain free and continuing strengthening exercises. Injury was on Jan 5th.  9. CAD s/p CABG: On Lipitor  10 A fib: Monitor HR tid--continue Xarelto. Resume metoprolol  11. HTN: Monitor BP tid--has been controlled without medications. Avoid hypoperfusion--out of 5 day window.              --Well controlled: continue metoprolol. Continue to hold Lasix, Diovan and Amlodipine--low dose due to SE)  12.   H/o gout: Managed on allopurinol.  13. T2DM: Hgb A1c-6.5--> is better per wife. Discussed result with patient and wife and recommend repeating in 3 months. Provided dietary counseling and start metformin 250mg  daily (Cr normal). Discussed potential side effects and benefits.               --Will monitor BS ac/hs. Add CM restrictions to diet. .  14. Vitamin B 12 deficiency: Resume supplement.  15. Left knee OA: Ice TID. He has received neoprene knee sleeve.  Quadriceps strengthening exercises.  16. Obesity (BMI 30.51): He will benefit from dietary counseling.  17. Chronic Phimosis: patient to continue to reduce nightly.              --Frequency managed with pads.  18. Disposition: plan is for d/c home with wife, who is a former Therapist, sports. He has home wheelchair, grab bar, chair lift is being installed    LOS: 2 days [place] 08/13/2020, 9:39 AM

## 2020-08-13 NOTE — IPOC Note (Signed)
Overall Plan of Care [facility]) Patient Details Name: Shane Espinoza MRN: [id number] DOB: 1940/07/06  Admitting Diagnosis: Stroke (cerebrum) [facility])  Hospital Problems: Principal Problem:   Stroke (cerebrum) [facility])     Functional Problem List: Nursing Behavior,Bladder,Bowel,Edema,Endurance,Medication Management,Pain,Safety,Skin Integrity  PT Balance,Edema,Endurance,Motor,Pain,Safety,Sensory,Skin Integrity  OT Balance,Endurance,Motor  SLP    TR         Basic ADL's: OT Eating,Grooming,Bathing,Dressing,Toileting     Advanced  ADL's: OT       Transfers: PT Bed Mobility,Bed to Chair,Car,Furniture  OT Toilet,Tub/Shower     Locomotion: PT Ambulation,Stairs     Additional Impairments: OT Fuctional Use of Upper Extremity  SLP None      TR      Anticipated Outcomes Item Anticipated Outcome  Self Feeding ind  Swallowing      Basic self-care  mod I  Toileting  mod I   Bathroom Transfers close S  Bowel/Bladder  Supervision  Transfers  SPV  Locomotion  CGA  Communication     Cognition  Patient presents with cognitive-linguistic abilities which are WFL-WNL as per assessment. He was 90% accurate for ALFA (Assessment of Language Related Functional Activities) for subtests: Solving Daily Math Problems and Reading Instructions and 100% for Writing a Check and Balancing a Checkbook. He demonstrates very good awareness, reasoning and short term memory/orientation. Patient and wife both report that he appears at his baseline for cognitive-linguistic function. Formal speech-language therapy not indicated at this time.  Pain  <4  Safety/Judgment  Supervision   Therapy Plan: PT Intensity: Minimum of 1-2 x/day ,45 to 90 minutes PT Frequency: 5 out of 7 days PT Duration Estimated Length of Stay: 10-14 days OT Intensity: Minimum of 1-2 x/day, 45 to 90 minutes OT Frequency: 5 out of 7 days OT Duration/Estimated Length of Stay: 10 to 13 days SLP Duration/Estimated Length of  Stay: N/A for ST   Due to the current state of emergency, patients may not be receiving their 3-hours of Medicare-mandated therapy.   Team Interventions: Nursing Interventions Patient/Family Education,Bladder Management,Bowel Management,Disease Management/Prevention,Pain Management,Medication Management,Skin Care/Wound Management,Discharge Planning,Psychosocial Support  PT interventions Ambulation/gait training,Discharge planning,Functional mobility training,Psychosocial support,Therapeutic Activities,Visual/perceptual remediation/compensation,Balance/vestibular training,Disease management/prevention,Neuromuscular re-education,Skin care/wound Heritage manager propulsion/positioning,Cognitive remediation/compensation,DME/adaptive equipment instruction,Pain management,Splinting/orthotics,UE/LE Strength taining/ROM,Community reintegration,Functional electrical stimulation,Stair training,Patient/family education,UE/LE Coordination activities  OT Interventions Balance/vestibular training,Disease mangement/prevention,Neuromuscular re-education,Self Care/advanced ADL retraining,Therapeutic Exercise,Wheelchair propulsion/positioning,DME/adaptive equipment instruction,Pain management,Skin care/wound managment,UE/LE Strength taining/ROM,Patient/family education,Community reintegration,UE/LE Coordination activities,Discharge planning,Functional mobility training,Psychosocial support,Therapeutic Activities  SLP Interventions    TR Interventions    SW/CM Interventions Discharge Planning,Psychosocial Support,Patient/Family Education,Disease Management/Prevention   Barriers to Discharge MD  Medical stability  Nursing Inaccessible home environment,Decreased caregiver support,Home environment access/layout,Incontinence,Wound Care,Lack of/limited family support,Weight bearing restrictions,Medication compliance,Behavior    PT Home environment access/layout,Other (comments) ankle fx + CVA  OT       SLP      SW       Team Discharge Planning: Destination: PT-Home ,OT- Home , SLP-Home Projected Follow-up: PT-Home health PT,Outpatient PT, OT-  Home health OT, SLP-None Projected Equipment Needs: PT- , OT- To be determined, SLP-None recommended by SLP Equipment Details: PT-owns  RW, OT-  Patient/family involved in discharge planning: PT- Patient,Family member/caregiver,  OT-Patient,Family member/caregiver, SLP-Family member/caregiver,Patient  MD ELOS: 10-14 days modI Medical Rehab Prognosis:  Excellent Assessment: Shane Espinoza is an 80 year old man who is admitted to CIR with right sided CVA and right sided ankle fracture. He also has right knee osteoarthritis with associated buckling which resulted in the fall that caused his ankle fracture. His  knee is being iced TID and a neoprene knee sleeve has been delivered to him. HgbA1c was found to be 6.5 and dietary education was provided and Metformin 250mg  was started.     See Team Conference Notes for weekly updates to the plan of care

## 2020-08-13 NOTE — Progress Notes (Signed)
Occupational Therapy Session Note  Patient Details  Name: Shane Espinoza MRN: [id number] Date of Birth: Aug 28, 1940  Today's Date: 08/13/2020 OT Individual Time: 3662-9476 OT Individual Time Calculation (min): 60 min    Short Term Goals: Week 1:  OT Short Term Goal 1 (Week 1): Pt will complete toilet transfer with CGA + AE PRN OT Short Term Goal 2 (Week 1): Pt will don pants with min A + AE PRN. OT Short Term Goal 3 (Week 1): Pt will stand to complete grooming task for at least 2 min with min A  Skilled Therapeutic Interventions/Progress Updates:    Session 1: ([id number])  Pt worked on bathing and dressing with spouse present.  He was able to transfer to the EOB with min assist.  Therapist donned cam boot on the right foot as well as gripper sock on the left.  He completed sit to stand with mod assist and then ambulated to the shower bench with min assist.  He was able to complete shower sit to stand with mod assist and required assist for washing his lower legs and feet.  He dried off and required total assist for donning the right cam boot to walk back out to the EOB for dressing.  Had him work on dressing with mod assist for threading brief and pants over his feet.  He was able to then stand with mod assist for pulling items up over his hips.  Therapist ace wrapped his RLE before putting on the cam boot as he states this is more comfortable for him.  Knee sleeve was also donned on the right knee for support secondary to history of weakness and buckling.  He was then able to apply deodorant and donn pullover shirt with supervision.  Finished session with transfer from the EOB to the recliner to wait for next session with PT.  Pt's spouse in the room and call button and phone in reach with safety belt in place.    Session 2: [phone])  Pt up in wheelchair to start session, transported to the ortho gym by therapist.  He then worked on sit to stand transitions, standing balance, and LUE  coordination with use of the BITs.  He completed visual scanning, visual pursuit rotator, and memory task.  He was able to use the LUE for all tasks with average reaction time for scanning from 1.5-1.74 seconds.  He was accurate at 93% with hitting the circle accurately.  With visual pursuit rotator he completed 1-30 numbers in 1:12 seconds reaction time, when the numbers would vanish off of the screen.  He then took approximately 6 seconds to locate them and press them when the numbers did not remove from the screen.  With memory task he was able to recall a 5 step word sequence but did need min instructional cueing to locate items in the left lower quadrant of the screen.  Standing balance initially with min assist on the first interval secondary to LOB to the left.  On all other intervals, he was able to stand with min guard and no LOB.  Finished session with return to the room and transfer to the recliner with the call button and phone in reach.    Therapy Documentation Precautions:  Precautions Precautions: Fall Precaution Comments: Rt [place] when OOB, RLE WBAT, R knee sleeve, L hemiparesis/lean Required Braces or Orthoses: Other Brace Other Brace: fall in January resulting in R ankle fx; Patient reports receiving "knee sleeve" to R knee to "help prevent  knee buckling" Restrictions Weight Bearing Restrictions: No RLE Weight Bearing: Weight bearing as tolerated Other Position/Activity Restrictions: with [place]   Pain: Pain Assessment Pain Scale: Faces Pain Score: 2  Faces Pain Scale: No hurt Pain Type: Acute pain Pain Location: Ankle Pain Intervention(s): Cold applied;Repositioned Multiple Pain Sites: No ADL: See Care Tool Section for some details of mobility and selfcare  Therapy/Group: Individual Therapy  Reta Norgren OTR/L 08/13/2020, 10:55 AM

## 2020-08-13 NOTE — Progress Notes (Signed)
Physical Therapy Session Note  Patient Details  Name: Shane Espinoza MRN: [id number] Date of Birth: [birth year]  Today's Date: 08/13/2020 PT Individual Time: 1030-1130; 2595-6387 PT Individual Time Calculation (min): 60 min and 45 mins  Short Term Goals: Week 1:  PT Short Term Goal 1 (Week 1): Patient will completed sit <> supine transition with no more than CGA consistently PT Short Term Goal 2 (Week 1): Patient will complete sit <> stand with LRAD and no more than MinA consistently PT Short Term Goal 3 (Week 1): Patient will ambulate >75 with LRAD and MinA PT Short Term Goal 4 (Week 1): Patient will negotiate at least 3 steps with U HR use and MinA  Skilled Therapeutic Interventions/Progress Updates:    Session 1: Patient received sitting up in wc, agreeable to PT. He denies pain. PT propelling patient in wc to therapy gym for time management and energy conservation. He completed seated R ankle circles on air cushion with emphasis on full ROM available. Ankle alphabet completed R LE with consistent verbal cues to not compensate for limited ROM with excessive knee/hip movement. PA clearing patient for light resistance band work to R ankle. 4-way ankle completed 3x12. Anterior ball roll out 2x20 to encourage full anterior weight shift for carryover to sit <>stand transfers. Sit <> stand 2x4 with MinA and noted excellent carryover from anterior roll out task. Patient ambulating back to his room x129f with RW and MInA. Verbal cues for larger stride length B and to maintain RW on the ground. Patient intermittently responsive to multimodal cues to complete the above. Patient remaining up in wc, seatbelt alarm on, call light within reach.    Session 2: Patient received leaving bathroom with wife and NT present, agreeable to PT. He denies pain. PT propelling patient in wc to therapy gym for time management and energy conservation. He was able to complete sit <> stand with RW and MinA x3 with noted  excellent carryover of anterior weight shift from AM session. Patient does shift weight, minimally, to the L in the standing, but this could be due to CAM boot. Patient completing reciprocating toe taps onto 6" box with B UE support and MinA. Noted appropriate foot clearance and adequate weight shifting. Patient does rely heavily on B UE support, however. Prolonged standing task and bimanual fine motor task completed. He required MinA to remain standing, but with fatigue, would develop L lateral drift. Patient responsive to multimodal cues to return to midline. Patient ambulating ~1046fwith RW and CGA/MinA. Step-to pattern with L LE intermittently with good response to cues to take reciprocating steps. At times, patient attempting to lift RW when stepping to advance it. PT noting blanchable red spots to R medial hallux and L lateral 5th met head, likely due to CAM boot. PT providing half-sock to assist with skin protection. RN, Josh aware. Patient remaining up in wc, seatbelt alarm on, call light within reach.   Therapy Documentation Precautions:  Precautions Precautions: Fall Precaution Comments: Rt [place] when OOB, RLE WBAT, R knee sleeve, L hemiparesis/lean Required Braces or Orthoses: Other Brace Other Brace: fall in January resulting in R ankle fx; Patient reports receiving "knee sleeve" to R knee to "help prevent knee buckling" Restrictions Weight Bearing Restrictions: Yes RLE Weight Bearing: Weight bearing as tolerated Other Position/Activity Restrictions: with [place]    Therapy/Group: Individual Therapy  JeKaroline CaldwellPT, DPT, CBIS  08/13/2020, 7:40 AM

## 2020-08-14 DIAGNOSIS — E1169 Type 2 diabetes mellitus with other specified complication: Secondary | ICD-10-CM

## 2020-08-14 DIAGNOSIS — E669 Obesity, unspecified: Secondary | ICD-10-CM

## 2020-08-14 DIAGNOSIS — S82891S Other fracture of right lower leg, sequela: Secondary | ICD-10-CM

## 2020-08-14 DIAGNOSIS — I6350 Cerebral infarction due to unspecified occlusion or stenosis of unspecified cerebral artery: Secondary | ICD-10-CM

## 2020-08-14 LAB — GLUCOSE, CAPILLARY
Glucose-Capillary: 127 mg/dL — ABNORMAL HIGH (ref 70–99)
Glucose-Capillary: 106 mg/dL — ABNORMAL HIGH (ref 70–99)
Glucose-Capillary: 127 mg/dL — ABNORMAL HIGH (ref 70–99)
Glucose-Capillary: 126 mg/dL — ABNORMAL HIGH (ref 70–99)

## 2020-08-14 NOTE — Progress Notes (Signed)
Nursing student conducted assessment I agree with student's assessment of patient

## 2020-08-14 NOTE — Progress Notes (Signed)
Occupational Therapy Session Note  Patient Details  Name: Shane Espinoza MRN: [id number] Date of Birth: Mar 19, 1941  Today's Date: 08/14/2020 OT Individual Time: 1115-1200 OT Individual Time Calculation (min): 45 min   And  2202-5427 06  Short Term Goals: Week 1:  OT Short Term Goal 1 (Week 1): Pt will complete toilet transfer with CGA + AE PRN OT Short Term Goal 2 (Week 1): Pt will don pants with min A + AE PRN. OT Short Term Goal 3 (Week 1): Pt will stand to complete grooming task for at least 2 min with min A  Skilled Therapeutic Interventions/Progress Updates:    1:1. Pt received in w/c agreeable to OT. Pt reporting need to toilet. Pt completes MOD A stand pivot with grab bar and MOD A to manage pants before and after toileting. Pt able to void bladder on toilet. In tx gym, pt ablt to doff/don shoe but demo poor ability to tie shoe. Installed elastic laces into L shoe. Pt and OT problem solve donning/doffing brace on R foot. From w/c, pt able to doff and don with MIN A only to hold boot when placing foot. Educated on having w/c locked and L foot on floor when R foot is elevated on LE rest to keep w/c from tipping forward. Exited session with pt seated in w/c, exit alarm on and call light in reach  Session 2: pt received in bed agreeable to OT. Pt completes 3x1 min beach ball volley with 1 min rest intervals to improve BUE strengthening, endurance and NMR of LUE with 4# dowel rod. Pt completes stand pivot transfers throughout session with RW and MIN A overall with VC for RW management and controlled decent to seat. Pt completes 2x10 each sit up to wedge, sit up with medicine ball, lateral flexion side taps for core strengthening/stability as well as trunk elongation and shortening. Pt stands with RW and min-CGA with VC for upright posture and midline orientation while matching cards on vertical board with 2 errors. Exited session with pt seated in recliner, exit alarm on and call light  in reach    Therapy Documentation Precautions:  Precautions Precautions: Fall Precaution Comments: Rt [place] when OOB, RLE WBAT, R knee sleeve, L hemiparesis/lean Required Braces or Orthoses: Other Brace Other Brace: fall in January resulting in R ankle fx; Patient reports receiving "knee sleeve" to R knee to "help prevent knee buckling" Restrictions Weight Bearing Restrictions: No RLE Weight Bearing: Weight bearing as tolerated Other Position/Activity Restrictions: with [place] General:   Vital Signs: Therapy Vitals Temp: 98.3 F (36.8 C) Temp Source: Oral Pulse Rate: 85 Resp: 20 BP: 117/61 Patient Position (if appropriate): Lying Oxygen Therapy SpO2: 98 % O2 Device: Room Air Pain: Pain Assessment Pain Scale: 0-10 Pain Score: 0-No pain ADL: ADL Eating: Set up Grooming: Moderate assistance (standing) Where Assessed-Grooming: Standing at sink Upper Body Bathing: Supervision/safety Where Assessed-Upper Body Bathing: Wheelchair Lower Body Bathing: Moderate assistance Where Assessed-Lower Body Bathing: Wheelchair Upper Body Dressing: Supervision/safety Where Assessed-Upper Body Dressing: Wheelchair Lower Body Dressing: Maximal assistance Where Assessed-Lower Body Dressing: Wheelchair Toileting: Minimal assistance Where Assessed-Toileting: Glass blower/designer: Moderate assistance Toilet Transfer Method: Stand pivot Toilet Transfer Equipment: Bedside commode,Grab bars Social research officer, government: Moderate assistance Social research officer, government Method: Radiographer, therapeutic: Engineer, maintenance (IT)    Praxis   Exercises:   Other Treatments:     Therapy/Group: Individual Therapy  Tonny Branch 08/14/2020, 10:33 AM

## 2020-08-14 NOTE — Progress Notes (Signed)
[facility] PHYSICAL MEDICINE & REHABILITATION PROGRESS NOTE   Subjective/Complaints: Doing well. No complaints. Had a good night's sleep.   ROS: Patient denies fever, rash, sore throat, blurred vision, nausea, vomiting, diarrhea, cough, shortness of breath or chest pain,   headache, or mood change.    Objective:   No results found. Recent Labs    08/12/20 0528  WBC 9.0  HGB 14.3  HCT 44.7  PLT 180   Recent Labs    08/12/20 0528  NA 138  K 4.4  CL 105  CO2 25  GLUCOSE 127*  BUN 17  CREATININE 1.24  CALCIUM 9.1    Intake/Output Summary (Last 24 hours) at 08/14/2020 1026 Last data filed at 08/14/2020 0800 Gross per 24 hour  Intake 360 ml  Output 650 ml  Net -290 ml        Physical Exam: Vital Signs Blood pressure 117/61, pulse 85, temperature 98.3 F (36.8 C), temperature source Oral, resp. rate 20, height 5\' 9"  (1.753 m), weight 97.7 kg, SpO2 98 %.  Constitutional: No distress . Vital signs reviewed. HEENT: EOMI, oral membranes moist Neck: supple Cardiovascular: RRR without murmur. No JVD    Respiratory/Chest: CTA Bilaterally without wheezes or rales. Normal effort    GI/Abdomen: BS +, non-tender, non-distended Ext: no clubbing, cyanosis, or edema Psych: pleasant and cooperative Skin: intact Neuro:Alert and oriented x 3. Normal insight and awareness. Intact Memory. Normal language and speech. Cranial nerve exam unremarkable  Speech clear. Able to follow simple one step commands. Left sided weakness noted.  4/5 throughout left arm/leg, RUE/RLE 5/5 Musc: right knee tender with ROM  Assessment/Plan: 1. Functional deficits which require 3+ hours per day of interdisciplinary therapy in a comprehensive inpatient rehab setting.  Physiatrist is providing close team supervision and 24 hour management of active medical problems listed below.  Physiatrist and rehab team continue to assess barriers to discharge/monitor patient progress toward functional and medical  goals  Care Tool:  Bathing    Body parts bathed by patient: Right arm,Left arm,Front perineal area,Abdomen,Chest,Right upper leg,Left upper leg,Face   Body parts bathed by helper: Left lower leg,Right lower leg,Buttocks     Bathing assist Assist Level: Moderate Assistance - Patient 50 - 74%     Upper Body Dressing/Undressing Upper body dressing   What is the patient wearing?: Pull over shirt    Upper body assist Assist Level: Supervision/Verbal cueing    Lower Body Dressing/Undressing Lower body dressing      What is the patient wearing?: Pants     Lower body assist Assist for lower body dressing: Maximal Assistance - Patient 25 - 49%     Toileting Toileting    Toileting assist Assist for toileting: Set up assist     Transfers Chair/bed transfer  Transfers assist     Chair/bed transfer assist level: Moderate Assistance - Patient 50 - 74%     Locomotion Ambulation   Ambulation assist      Assist level: Minimal Assistance - Patient > 75% Assistive device: Walker-rolling Max distance: 150   Walk 10 feet activity   Assist     Assist level: Minimal Assistance - Patient > 75% Assistive device: Walker-rolling   Walk 50 feet activity   Assist    Assist level: Minimal Assistance - Patient > 75% Assistive device: Walker-rolling    Walk 150 feet activity   Assist Walk 150 feet activity did not occur: Safety/medical concerns  Assist level: Minimal Assistance - Patient > 75% Assistive device:  Walker-rolling    Walk 10 feet on uneven surface  activity   Assist Walk 10 feet on uneven surfaces activity did not occur: Safety/medical concerns         Wheelchair     Assist Will patient use wheelchair at discharge?: No             Wheelchair 50 feet with 2 turns activity    Assist            Wheelchair 150 feet activity     Assist          Blood pressure 117/61, pulse 85, temperature 98.3 F (36.8 C),  temperature source Oral, resp. rate 20, height 5\' 9"  (1.753 m), weight 97.7 kg, SpO2 98 %.  Medical Problem List and Plan: 1.  Impaired mobility and ADLs secondary to right sided acute CVA and right sided ankle fracture             -patient may shower             -ELOS/Goals: 10-14 days modI   Continue CIR 2.  Antithrombotics: -DVT/anticoagulation:  Pharmaceutical: Xarelto             -antiplatelet therapy: N/A 3. Pain Management: Continue icing right knee. D/c'd oxycodone- not requiring 4. Mood: LCSW to follow for evaluation and support.              -antipsychotic agents: N/A 5. Neuropsych: This patient is capable of making decisions on his own behalf. 6. Skin/Wound Care: Routine pressure relief measures.  7. Fluids/Electrolytes/Nutrition: Monitor I/O. CMET reviewed and stable 8. Right ankle Fx: WBAT with CAM boot. Wife apparently interested in finding out what ortho plan is for right ankle ( Dr. Stann Mainland). He is out of office until Monday. We can call him on Monday to discuss plan-  Injury was on Jan 5th.  9. CAD s/p CABG: On Lipitor  10 A fib: Monitor HR tid--continue Xarelto. Resume metoprolol  11. HTN: Monitor BP tid--has been controlled without medications. Avoid hypoperfusion--out of 5 day window.              --Well controlled: continue metoprolol. Continue to hold Lasix, Diovan and Amlodipine--low dose due to SE)  12.   H/o gout: Managed on allopurinol.  13. T2DM: Hgb A1c-6.5--> is better per wife. Discussed result with patient and wife and recommend repeating in 3 months. Provided dietary counseling and start metformin 250mg  daily (Cr normal). Discussed potential side effects and benefits.               --Added CM restrictions to diet.   -CBG's fairly well controlled 2/12 14. Vitamin B 12 deficiency: Resume supplement.  15. Left knee OA: Ice TID. He has received neoprene knee sleeve.  Quadriceps strengthening exercises.  16. Obesity (BMI 30.51): He will benefit from dietary  counseling.  17. Chronic Phimosis: patient to continue to reduce nightly.              --Frequency managed with pads.  18. Disposition: plan is for d/c home with wife, who is a former Therapist, sports. He has home wheelchair, grab bar, chair lift is being installed    LOS: 3 days [place] 08/14/2020, 10:26 AM

## 2020-08-14 NOTE — Progress Notes (Signed)
Physical Therapy Session Note  Patient Details  Name: Shane Espinoza MRN: [id number] Date of Birth: [birth year]  Today's Date: 08/14/2020 PT Individual Time: 0800-0900; 1345-1415 PT Individual Time Calculation (min): 60 min and 30 mins  Short Term Goals: Week 1:  PT Short Term Goal 1 (Week 1): Patient will completed sit <> supine transition with no more than CGA consistently PT Short Term Goal 2 (Week 1): Patient will complete sit <> stand with LRAD and no more than MinA consistently PT Short Term Goal 3 (Week 1): Patient will ambulate >75 with LRAD and MinA PT Short Term Goal 4 (Week 1): Patient will negotiate at least 3 steps with U HR use and MinA  Skilled Therapeutic Interventions/Progress Updates:    Session 1: Patient received supine in bed, wife at bedside, agreeable to PT. He denies pain. Patient mostly dressed with wifes assist this AM. He was able to come sit edge of bed with CGA and HOB slightly elevated. CAM boot already donned. ModA to come to stand with verbal cues for anterior weight shift and hand positioning. ModA to pull pants up remainder of the way. He was able to ambulate ~69ft to wc with MinA and RW. PT propelling patient in wc to therapy gym for time management and energy conservation. Patient transferring to therapy mat with MinA and RW. Seated therex as follows: LAQ, [place], marches, hip abd/add 3x12. Patient completing standing balance/tolerance task with U UE support completing fine motor UE task. Patient with L lateral drift with fatigue, but able to correct with midline with multimodal cues. Patient ambulating back to room with RW and CGA/MinA. He continues to require verbal cues for upright posture and larger reciprocating steps. Patient remaining up in wc, seatbelt alarm on, call light within reach.    Session 2: Patient received sitting up in wc, agreeable to PT. He denies pain. Wife requesting update on patients progress with functional mobility- PT providing.  Patient requested to use bathroom. He was able to ambulate into bathroom with RW and CGA. He remains needing MinA to stand up. Patient continent of bowel and independent with perihygiene while seated. Patient returning to wc. Sit <> stand x4 with RW and MinA. Verbal cues for anterior weight shift and posterior pelvic tilt upon standing to assist with fully upright posture. Patient remaining up in chair, seatbelt alarm on, call light within reach.    Therapy Documentation Precautions:  Precautions Precautions: Fall Precaution Comments: Rt [place] when OOB, RLE WBAT, R knee sleeve, L hemiparesis/lean Required Braces or Orthoses: Other Brace Other Brace: fall in January resulting in R ankle fx; Patient reports receiving "knee sleeve" to R knee to "help prevent knee buckling" Restrictions Weight Bearing Restrictions: No RLE Weight Bearing: Weight bearing as tolerated Other Position/Activity Restrictions: with [place]    Therapy/Group: Individual Therapy  Karoline Caldwell, PT, DPT, CBIS  08/14/2020, 7:36 AM

## 2020-08-15 LAB — GLUCOSE, CAPILLARY
Glucose-Capillary: 98 mg/dL (ref 70–99)
Glucose-Capillary: 97 mg/dL (ref 70–99)
Glucose-Capillary: 131 mg/dL — ABNORMAL HIGH (ref 70–99)
Glucose-Capillary: 104 mg/dL — ABNORMAL HIGH (ref 70–99)

## 2020-08-16 ENCOUNTER — Inpatient Hospital Stay (HOSPITAL_COMMUNITY): Payer: Medicare HMO

## 2020-08-16 DIAGNOSIS — I639 Cerebral infarction, unspecified: Secondary | ICD-10-CM

## 2020-08-16 DIAGNOSIS — I63031 Cerebral infarction due to thrombosis of right carotid artery: Secondary | ICD-10-CM

## 2020-08-16 LAB — CBC
HCT: 43.6 % (ref 39.0–52.0)
Hemoglobin: 14.3 g/dL (ref 13.0–17.0)
MCH: 30 pg (ref 26.0–34.0)
MCHC: 32.8 g/dL (ref 30.0–36.0)
MCV: 91.6 fL (ref 80.0–100.0)
Platelets: 179 10*3/uL (ref 150–400)
RBC: 4.76 MIL/uL (ref 4.22–5.81)
RDW: 13.2 % (ref 11.5–15.5)
WBC: 8.3 10*3/uL (ref 4.0–10.5)
nRBC: 0 % (ref 0.0–0.2)

## 2020-08-16 LAB — BASIC METABOLIC PANEL
Anion gap: 10 (ref 5–15)
BUN: 21 mg/dL (ref 8–23)
CO2: 23 mmol/L (ref 22–32)
Calcium: 8.9 mg/dL (ref 8.9–10.3)
Chloride: 104 mmol/L (ref 98–111)
Creatinine, Ser: 1.15 mg/dL (ref 0.61–1.24)
GFR, Estimated: >60 mL/min (ref >60)
Glucose, Bld: 111 mg/dL — ABNORMAL HIGH (ref 70–99)
Potassium: 3.8 mmol/L (ref 3.5–5.1)
Sodium: 137 mmol/L (ref 135–145)

## 2020-08-16 LAB — GLUCOSE, CAPILLARY
Glucose-Capillary: 106 mg/dL — ABNORMAL HIGH (ref 70–99)
Glucose-Capillary: 115 mg/dL — ABNORMAL HIGH (ref 70–99)
Glucose-Capillary: 108 mg/dL — ABNORMAL HIGH (ref 70–99)
Glucose-Capillary: 105 mg/dL — ABNORMAL HIGH (ref 70–99)

## 2020-08-16 NOTE — Progress Notes (Signed)
Occupational Therapy Session Note  Patient Details  Name: Shane Espinoza MRN: [id number] Date of Birth: 04-19-1941  Today's Date: 08/16/2020 OT Individual Time: 1120-1202 OT Individual Time Calculation (min): 42 min    Short Term Goals: Week 1:  OT Short Term Goal 1 (Week 1): Pt will complete toilet transfer with CGA + AE PRN OT Short Term Goal 2 (Week 1): Pt will don pants with min A + AE PRN. OT Short Term Goal 3 (Week 1): Pt will stand to complete grooming task for at least 2 min with min A  Skilled Therapeutic Interventions/Progress Updates:    Session 1: (4970-2637)  Pt on the toilet to start session with spouse and NT in the room.  Pt agreed to complete transfer to the shower secondary to having urine spill onto the floor and onto some of his clothing with gap between bottom of the 3:1 and actual toilet.  He was able to complete transfer over to the tub seat with mod assist using the RW and trying to keep weight off of the RLE secondary to having removed boot in order to remove clothing.  He was able to complete showering with supervision for UB and mod assist for LB secondary to sit to stand.  He was able to complete washing his back as well as lower legs and feet with use of the LH sponge.  He was able to dry them with use of the reacher and a towel as well.  Next, he transferred stand pivot with mod assist using the grab bar to the wheelchair.  He then worked on dressing with supervision for UB and donned his underpants and pants with supervision over his feet with use of the reacher.  He needed min assist for standing with the RW to pull them up over his hips.  He needed total assist for ace wrapping the RLE and then donning his cam boot.  He donned his slip on shoe on the left foot with setup and elastic laces in place.  Finished session with the call button and phone in reach and spouse present.    Session 2: (1351-1450)  Pt completed transfer from the wheelchair to the therapy  mat with min assist using the RW for support.  He then worked on doffing and donning his left sock and shoe as well as doffing and donning his cam boot.  He was able to complete doffing and donning the left sock and left shoe with elastic laces with supervision.  He was then able to remove the cam boot and then donned it with setup and min instructional cueing as well.  Discussed availability of AE for helping to donn socks and had him demonstrate use of sockaide as well so when pt gets his cam boot off, he will be aware of equipment that is out there to help if needed.  Also educated on shoe funnel use for donning shoes instead of the traditional shoe horn.  Finished session with work on standing balance without an assistive device.  He needed mod assist for sit to stand from the lowered mat and mod assist to maintain standing balance without UE support.  He was able to complete transfer to the wheelchair with min assist using the RW to complete session.  Returned to the room where he was left with the call button and phone in reach and safety belt in place.    Therapy Documentation Precautions:  Precautions Precautions: Fall Precaution Comments: Rt [place] when OOB,  RLE WBAT, R knee sleeve, L hemiparesis/lean Required Braces or Orthoses: Other Brace Other Brace: fall in January resulting in R ankle fx; Patient reports receiving "knee sleeve" to R knee to "help prevent knee buckling" Restrictions Weight Bearing Restrictions: No RLE Weight Bearing: Weight bearing as tolerated Other Position/Activity Restrictions: with [place]  Pain: Pain Assessment Pain Scale: Faces Pain Score: 0-No pain ADL: See Care Tool Section for some details of mobility and selfcare  Therapy/Group: Individual Therapy  Tony Granquist  OTR/L 08/16/2020, 12:46 PM

## 2020-08-16 NOTE — Progress Notes (Signed)
Physical Therapy Session Note  Patient Details  Name: Shane Espinoza MRN: [id number] Date of Birth: 1940-12-25  Today's Date: 08/16/2020 PT Individual Time: 2355-7322 PT Individual Time Calculation (min): 74 min   Short Term Goals: Week 1:  PT Short Term Goal 1 (Week 1): Patient will completed sit <> supine transition with no more than CGA consistently PT Short Term Goal 2 (Week 1): Patient will complete sit <> stand with LRAD and no more than MinA consistently PT Short Term Goal 3 (Week 1): Patient will ambulate >75 with LRAD and MinA PT Short Term Goal 4 (Week 1): Patient will negotiate at least 3 steps with U HR use and MinA  Skilled Therapeutic Interventions/Progress Updates:    Patient received sitting up in wc, agreeable to PT. He denies pain. PT propelling patient in wc to therapy gym for time management and energy conservation. Patient able to demonstrate doffing/donning CAM boot with MinA and Max verbal cues. He reports that his wife can help him with this task, but would benefit from further practice to have him be as independent as possible. Patient complete ankle alphabet B and 4-way ankle with light resistance, R LE only. Sit <>stand x5 with RW and MinA. Good carryover of previous sessions noted for anterior weight shift and pelvic tuck. Patient completing fwd step ups in parallel bars onto 6" box and B UE support x12. Patient alternating feet and demonstrating increased difficulty when ascending with R LE. Patient able to complete lateral step ups onto 6"box leading with LLE, but unable to complete this task when leading with R LE due to weakness. Hooklying bridges with  LE x12, then with U LE x12 each noting equal buttock clearance. Patient ambulating 110ft with RW and CGA with verbal cues for larger step length B and smoother reciprocal stepping. Patient remaining up in chair, wife at bedside, call light within reach.  Therapy Documentation Precautions:   Precautions Precautions: Fall Precaution Comments: Rt [place] when OOB, RLE WBAT, R knee sleeve, L hemiparesis/lean Required Braces or Orthoses: Other Brace Other Brace: fall in January resulting in R ankle fx; Patient reports receiving "knee sleeve" to R knee to "help prevent knee buckling" Restrictions Weight Bearing Restrictions: No RLE Weight Bearing: Weight bearing as tolerated Other Position/Activity Restrictions: with [place]    Therapy/Group: Individual Therapy  Karoline Caldwell, PT, DPT, CBIS  08/16/2020, 7:35 AM

## 2020-08-16 NOTE — Progress Notes (Signed)
[facility] PHYSICAL MEDICINE & REHABILITATION PROGRESS NOTE   Subjective/Complaints:  Discussed CVA with pt and wife  Discussed BVI post void as well   ROS: Patient denies CP, SOB,  N/V/D  Objective:   No results found. Recent Labs    08/16/20 0547  WBC 8.3  HGB 14.3  HCT 43.6  PLT 179   Recent Labs    08/16/20 0547  NA 137  K 3.8  CL 104  CO2 23  GLUCOSE 111*  BUN 21  CREATININE 1.15  CALCIUM 8.9    Intake/Output Summary (Last 24 hours) at 08/16/2020 0802 Last data filed at 08/16/2020 0558 Gross per 24 hour  Intake 960 ml  Output 1575 ml  Net -615 ml        Physical Exam: Vital Signs Blood pressure 140/69, pulse 70, temperature 98.6 F (37 C), resp. rate 20, height 5\' 9"  (1.753 m), weight 97.7 kg, SpO2 98 %.   General: No acute distress Mood and affect are appropriate Heart: Regular rate and rhythm no rubs murmurs or extra sounds Lungs: Clear to auscultation, breathing unlabored, no rales or wheezes Abdomen: Positive bowel sounds, soft nontender to palpation, nondistended Extremities: No clubbing, cyanosis, or edema Skin: No evidence of breakdown, no evidence of rash  Neuro:Alert and oriented x 3. Normal insight and awareness. Intact Memory. Normal language and speech. Cranial nerve exam unremarkable  Speech clear. Able to follow simple one step commands. Left sided weakness noted.  4/5 throughout left arm/leg, RUE/RLE 5/5 except Righ tankle NT due to CAM walker  Musc: right knee tender with ROM  Assessment/Plan: 1. Functional deficits which require 3+ hours per day of interdisciplinary therapy in a comprehensive inpatient rehab setting.  Physiatrist is providing close team supervision and 24 hour management of active medical problems listed below.  Physiatrist and rehab team continue to assess barriers to discharge/monitor patient progress toward functional and medical goals  Care Tool:  Bathing    Body parts bathed by patient: Right arm,Left  arm,Front perineal area,Abdomen,Chest,Right upper leg,Left upper leg,Face   Body parts bathed by helper: Left lower leg,Right lower leg,Buttocks     Bathing assist Assist Level: Moderate Assistance - Patient 50 - 74%     Upper Body Dressing/Undressing Upper body dressing   What is the patient wearing?: Pull over shirt    Upper body assist Assist Level: Supervision/Verbal cueing    Lower Body Dressing/Undressing Lower body dressing      What is the patient wearing?: Pants     Lower body assist Assist for lower body dressing: Maximal Assistance - Patient 25 - 49%     Toileting Toileting    Toileting assist Assist for toileting: Set up assist     Transfers Chair/bed transfer  Transfers assist     Chair/bed transfer assist level: Minimal Assistance - Patient > 75%     Locomotion Ambulation   Ambulation assist      Assist level: Minimal Assistance - Patient > 75% Assistive device: Walker-rolling Max distance: 150   Walk 10 feet activity   Assist     Assist level: Minimal Assistance - Patient > 75% Assistive device: Walker-rolling   Walk 50 feet activity   Assist    Assist level: Minimal Assistance - Patient > 75% Assistive device: Walker-rolling    Walk 150 feet activity   Assist Walk 150 feet activity did not occur: Safety/medical concerns  Assist level: Minimal Assistance - Patient > 75% Assistive device: Walker-rolling    Walk 10  feet on uneven surface  activity   Assist Walk 10 feet on uneven surfaces activity did not occur: Safety/medical concerns         Wheelchair     Assist Will patient use wheelchair at discharge?: No             Wheelchair 50 feet with 2 turns activity    Assist            Wheelchair 150 feet activity     Assist          Blood pressure 140/69, pulse 70, temperature 98.6 F (37 C), resp. rate 20, height 5\' 9"  (1.753 m), weight 97.7 kg, SpO2 98 %.  Medical Problem List and  Plan: 1.  Impaired mobility and ADLs secondary to right sided acute subcortical  CVA and right sided ankle fracture             -patient may shower             -ELOS/Goals: 10-14 days modI   Continue CIR 2.  Antithrombotics: -DVT/anticoagulation:  Pharmaceutical: Xarelto             -antiplatelet therapy: N/A 3. Pain Management: Continue icing right knee. D/c'd oxycodone- not requiring 4. Mood: LCSW to follow for evaluation and support.              -antipsychotic agents: N/A 5. Neuropsych: This patient is capable of making decisions on his own behalf. 6. Skin/Wound Care: Routine pressure relief measures.  7. Fluids/Electrolytes/Nutrition: Monitor I/O. CMET reviewed and stable 8. Right ankle Fx: WBAT with CAM boot. Wife apparently interested in finding out what ortho plan is for right ankle ( Dr. Stann Mainland). He is out of office until Monday. We can call him on Monday to discuss plan-  Injury was on Jan 5th.  9. CAD s/p CABG: On Lipitor  10 A fib: Monitor HR tid--continue Xarelto. Resume metoprolol  11. HTN: Monitor BP tid--has been controlled without medications. Avoid hypoperfusion--out of 5 day window.              --Well controlled: continue metoprolol. Continue to hold Lasix, Diovan and Amlodipine--low dose due to SE)  Vitals:   08/15/20 2047 08/16/20 0330  BP: (!) 142/82 140/69  Pulse: 62 70  Resp: 17 20  Temp: 98.3 F (36.8 C) 98.6 F (37 C)  SpO2: 99% 98%   12.   H/o gout: Managed on allopurinol.  13  diabetes- on metformin low dose          follow up with PCP, Hgb A1C in 3 mo    - 14. Vitamin B 12 deficiency: Resume supplement.  15. Left knee OA: Ice TID. He has received neoprene knee sleeve.  Quadriceps strengthening exercises.  16. Obesity (BMI 30.51): He will benefit from dietary counseling.  17. Chronic Phimosis: patient to continue to reduce nightly.              --Frequency managed with pads.  18. Disposition: plan is for d/c home with wife, who is a former Therapist, sports. He has  home wheelchair, grab bar, chair lift is being installed    LOS: 5 days [place] 08/16/2020, 8:02 AM

## 2020-08-17 ENCOUNTER — Telehealth: Payer: Self-pay | Admitting: Cardiovascular Disease

## 2020-08-17 LAB — GLUCOSE, CAPILLARY: Glucose-Capillary: 107 mg/dL — ABNORMAL HIGH (ref 70–99)

## 2020-08-17 MED ORDER — CALCIUM CITRATE 950 (200 CA) MG PO TABS
1.0000 | ORAL_TABLET | Freq: Two times a day (BID) | ORAL | Status: DC
  Administered 2020-08-17 – 2020-08-26 (×19): 200 mg via ORAL
  Filled 2020-08-17 (×20): qty 1

## 2020-08-17 NOTE — Progress Notes (Signed)
Physical Therapy Session Note  Patient Details  Name: Shane Espinoza MRN: [id number] Date of Birth: 06/18/1941  Today's Date: 08/17/2020 PT Individual Time: 1100-1200 PT Individual Time Calculation (min): 60 min   Short Term Goals: Week 1:  PT Short Term Goal 1 (Week 1): Patient will completed sit <> supine transition with no more than CGA consistently PT Short Term Goal 2 (Week 1): Patient will complete sit <> stand with LRAD and no more than MinA consistently PT Short Term Goal 3 (Week 1): Patient will ambulate >75 with LRAD and MinA PT Short Term Goal 4 (Week 1): Patient will negotiate at least 3 steps with U HR use and MinA  Skilled Therapeutic Interventions/Progress Updates:   Patient received up in wc with wife present, agreeable to PT. He denies pain. Patient and wife with questions regarding new weight bearing orders since xray yesterday showed no evidence of healing. PT did not see new orders, nor were any mentioned in MD note for the day. PT reviewing 1st and 2nd xrays with patient and wife orienting to anatomy and location of fx sites. PT also reviewing stages of typical bone healing with patient and wife including factors that may extend time for healing, including age of patient. Patient and wife without further questions at this time. PT propelling patient in wc to therapy gym for time management and energy conservation. Therex as follows with 4# ankle weight: 3x10 LAQ, seated marches, ankle alphabet without resistance. Patient returning to room in wc requesting to use bathroom. Ambulating x31ft with RW and CGA/MinA. Patient remaining in bathroom, NT aware, pull string within reach.     Therapy Documentation Precautions:  Precautions Precautions: Fall Precaution Comments: Rt [place] when OOB, RLE WBAT, R knee sleeve, L hemiparesis/lean Required Braces or Orthoses: Other Brace Other Brace: fall in January resulting in R ankle fx; Patient reports receiving "knee sleeve" to  R knee to "help prevent knee buckling" Restrictions Weight Bearing Restrictions: No RLE Weight Bearing: Weight bearing as tolerated Other Position/Activity Restrictions: with [place]    Therapy/Group: Individual Therapy  Karoline Caldwell, PT, DPT, CBIS  08/17/2020, 7:41 AM

## 2020-08-17 NOTE — Progress Notes (Signed)
Occupational Therapy Session Note  Patient Details  Name: Shane Espinoza MRN: [id number] Date of Birth: [birth year]  Today's Date: 08/17/2020 OT Individual Time: 1005-1105 OT Individual Time Calculation (min): 60 min    Short Term Goals: Week 1:  OT Short Term Goal 1 (Week 1): Pt will complete toilet transfer with CGA + AE PRN OT Short Term Goal 2 (Week 1): Pt will don pants with min A + AE PRN. OT Short Term Goal 3 (Week 1): Pt will stand to complete grooming task for at least 2 min with min A  Skilled Therapeutic Interventions/Progress Updates:    Pt completed sit to stand transitions, standing balance, and functional mobility during session.  He was able to complete several sit to stand transitions from the wheelchair with min assist.  He then used the LUE to reach for bean bags in standing and toss them to the [employer].  He was able to stand with min guard assist when using the RW for support, but did demonstrate some increased lean to the left.  When standing without UE support he needed min assist to regain and maintain balance while reaching for bean bags.  If reaching to the right and weightshifting over the RLE, it would buckle requiring mod assist to regain balance.  Utilized the RW and the reacher for picking up the bean bags from the floor with min assist after each toss.  Rest breaks for 2-3 mins between each set.  Finished session with return to the room with use of the wheelchair.  He was left with the call button and phone in reach with spouse present.    Therapy Documentation Precautions:  Precautions Precautions: Fall Precaution Comments: Rt [place] when OOB, RLE WBAT, R knee sleeve, L hemiparesis/lean Required Braces or Orthoses: Other Brace Other Brace: fall in January resulting in R ankle fx; Patient reports receiving "knee sleeve" to R knee to "help prevent knee buckling" Restrictions Weight Bearing Restrictions: No RLE Weight Bearing: Weight bearing as  tolerated Other Position/Activity Restrictions: with [place]  Pain: Pain Assessment Pain Scale: Faces Pain Score: 0-No pain Pain Type: Acute pain Pain Location: Ankle ADL: See Care Tool Section for some details of mobility and selfcare  Therapy/Group: Individual Therapy  Raliegh Scobie OTR/L 08/17/2020, 12:45 PM

## 2020-08-17 NOTE — Telephone Encounter (Signed)
Returned the call to the patient and wife. She stated that the patient has been in the hospital since 08/05/20 due to a CVA. They were calling to update Dr. Claiborne Billings.

## 2020-08-17 NOTE — Telephone Encounter (Signed)
Patient's wife calling to inform Dr. Claiborne Billings that her husband was admitted to [facility] on 08/05/20 for a stroke. He is now currently in Rehab. She would like to know what steps they need to take in order to continue his care. Please advise.

## 2020-08-17 NOTE — Progress Notes (Signed)
Physical Therapy Session Note  Patient Details  Name: Shane Espinoza MRN: [id number] Date of Birth: October 05, 1940  Today's Date: 08/17/2020 PT Individual Time: 0908-1005 PT Individual Time Calculation (min): 57 min   Short Term Goals: Week 1:  PT Short Term Goal 1 (Week 1): Patient will completed sit <> supine transition with no more than CGA consistently PT Short Term Goal 2 (Week 1): Patient will complete sit <> stand with LRAD and no more than MinA consistently PT Short Term Goal 3 (Week 1): Patient will ambulate >75 with LRAD and MinA PT Short Term Goal 4 (Week 1): Patient will negotiate at least 3 steps with U HR use and MinA  Skilled Therapeutic Interventions/Progress Updates:    Pt received sitting in w/c with his wife, [name], present and pt agreeable to therapy session.  Transported to/from gym in w/c for time management and energy conservation. Pt already wearing CAM boot on R LE. Sit>stand w/c>RW min assist due to minor posterior LOB upon coming to stand - pushing up from w/c armrest. Gait training ~124ft using RW CGA for steadying - demos varying step-to leading with R LE vs reciprocal gait pattern but does not maintain continuous forward movement of AD in order to partially off-weight R LE during stance - increased trunk flexion - significantly decreased gait speed.  B LE strengthening via the following exercises in a circuit fashion: - repeated sit<>stands to/from EOM using RW with pt requiring significant use of UEs to come to stand and control descent 2x8 reps with min assist progressing to CGA - repeated R/L LE foot taps on 4" progressed to 6" step using UE support on RW 2x10reps each with cuing for improved upright posture and decreased WBing support through B UEs  Gait training ~20ft 2x to/from // bars using RW with CGA - demos same gait impairments as above with slower gait speed back due to fatigue and 1x R knee slightly giving way but not requiring any additional assist  to maintain balance and continue ambulating. Dynamic gait training in // bars via lateral side stepping - CGA for safety - demos compensatory WBing through B UEs to off weight R LE during stance - cuing for upright posture.  Doffed CAM boot without cuing.  Performed the following R ankle AROM exercises:  - ankle PF/DF 2x15reps - ankle inversion/eversion x15reps - large circles clockwise/counterclockwise x15reps each - capital letter alphabet 1x Pt with significant difficulty disassociating ankle movement from whole lower leg movement requiring max cuing and therapist manually stabilizing lower leg and cuing for improved ankle control. Donned CAM boot min assist. Stand pivot to w/c using RW with min assist. Transported back to room and hand-off to [place], [place].    Therapy Documentation Precautions:  Precautions Precautions: Fall Precaution Comments: Rt [place] when OOB, RLE WBAT, R knee sleeve, L hemiparesis/lean Required Braces or Orthoses: Other Brace Other Brace: fall in January resulting in R ankle fx; Patient reports receiving "knee sleeve" to R knee to "help prevent knee buckling" Restrictions Weight Bearing Restrictions: No RLE Weight Bearing: Weight bearing as tolerated Other Position/Activity Restrictions: with [place]  Pain: Reports primarily having "sensitivity" in R lateral/posterior knee with pt stating he feels it is going to "give way" - pt stating injured further during his fall but chronic issue of "bone on bone" - states he is interested in exploring knee brace options for stability, will continue to assess/monitor need for brace. Provided rest breaks for pain management.    Therapy/Group: Individual Therapy  Tawana Scale , PT, DPT, CSRS  08/17/2020, 7:52 AM

## 2020-08-17 NOTE — Progress Notes (Signed)
[facility] PHYSICAL MEDICINE & REHABILITATION PROGRESS NOTE   Subjective/Complaints:    ROS: Patient denies CP, SOB,  N/V/D  Objective:   DG Ankle Complete Right  Result Date: 08/16/2020 CLINICAL DATA:  Fracture. EXAM: RIGHT ANKLE - COMPLETE 3+ VIEW COMPARISON:  07/08/2020 FINDINGS: Two view exam again shows the oblique fracture of the distal fibula. No evidence for bridging bony callus. Ankle mortise is largely preserved. Age indeterminate avulsion injury noted medial malleolus. IMPRESSION: 1. Oblique fracture of the distal fibula is still visible without evidence for bridging bony callus. 2. Age indeterminate avulsion injury of the medial malleolus. Electronically Signed   By: Misty Stanley M.D.   On: 08/16/2020 16:15   Recent Labs    08/16/20 0547  WBC 8.3  HGB 14.3  HCT 43.6  PLT 179   Recent Labs    08/16/20 0547  NA 137  K 3.8  CL 104  CO2 23  GLUCOSE 111*  BUN 21  CREATININE 1.15  CALCIUM 8.9    Intake/Output Summary (Last 24 hours) at 08/17/2020 0753 Last data filed at 08/17/2020 0546 Gross per 24 hour  Intake 120 ml  Output 625 ml  Net -505 ml        Physical Exam: Vital Signs Blood pressure (!) 159/95, pulse 73, temperature 97.7 F (36.5 C), temperature source Oral, resp. rate 16, height 5\' 9"  (1.753 m), weight 97.7 kg, SpO2 99 %.   General: No acute distress Mood and affect are appropriate Heart: Regular rate and rhythm no rubs murmurs or extra sounds Lungs: Clear to auscultation, breathing unlabored, no rales or wheezes Abdomen: Positive bowel sounds, soft nontender to palpation, nondistended Extremities: No clubbing, cyanosis, or edema Skin: No evidence of breakdown, no evidence of rash   Neuro:Alert and oriented x 3. Normal insight and awareness. Intact Memory. Normal language and speech. Cranial nerve exam unremarkable  Speech clear. Able to follow simple one step commands. Left sided weakness noted.  4/5 throughout left arm/leg, RUE/RLE  5/5 except Right ankle NT due to CAM walker  Musc: right knee tender with ROM  Assessment/Plan: 1. Functional deficits which require 3+ hours per day of interdisciplinary therapy in a comprehensive inpatient rehab setting.  Physiatrist is providing close team supervision and 24 hour management of active medical problems listed below.  Physiatrist and rehab team continue to assess barriers to discharge/monitor patient progress toward functional and medical goals  Care Tool:  Bathing    Body parts bathed by patient: Right arm,Left arm,Chest,Abdomen,Front perineal area,Buttocks,Right upper leg,Left upper leg,Face,Left lower leg,Right lower leg   Body parts bathed by helper: Left lower leg,Right lower leg,Buttocks     Bathing assist Assist Level: Moderate Assistance - Patient 50 - 74%     Upper Body Dressing/Undressing Upper body dressing   What is the patient wearing?: Pull over shirt    Upper body assist Assist Level: Supervision/Verbal cueing    Lower Body Dressing/Undressing Lower body dressing      What is the patient wearing?: Pants,Underwear/pull up     Lower body assist Assist for lower body dressing: Minimal Assistance - Patient > 75%     Toileting Toileting    Toileting assist Assist for toileting: Set up assist     Transfers Chair/bed transfer  Transfers assist     Chair/bed transfer assist level: Minimal Assistance - Patient > 75%     Locomotion Ambulation   Ambulation assist      Assist level: Contact Guard/Touching assist Assistive device: Walker-rolling Max  distance: 100   Walk 10 feet activity   Assist     Assist level: Contact Guard/Touching assist Assistive device: Walker-rolling   Walk 50 feet activity   Assist    Assist level: Contact Guard/Touching assist Assistive device: Walker-rolling    Walk 150 feet activity   Assist Walk 150 feet activity did not occur: Safety/medical concerns  Assist level: Minimal  Assistance - Patient > 75% Assistive device: Walker-rolling    Walk 10 feet on uneven surface  activity   Assist Walk 10 feet on uneven surfaces activity did not occur: Safety/medical concerns         Wheelchair     Assist Will patient use wheelchair at discharge?: No             Wheelchair 50 feet with 2 turns activity    Assist            Wheelchair 150 feet activity     Assist          Blood pressure (!) 159/95, pulse 73, temperature 97.7 F (36.5 C), temperature source Oral, resp. rate 16, height 5\' 9"  (1.753 m), weight 97.7 kg, SpO2 99 %.  Medical Problem List and Plan: 1.  Impaired mobility and ADLs secondary to right sided acute subcortical  CVA and right sided ankle fracture             -patient may shower             -ELOS/Goals: 10-14 days modI   Continue CIR 2.  Antithrombotics: -DVT/anticoagulation:  Pharmaceutical: Xarelto             -antiplatelet therapy: N/A 3. Pain Management: Continue icing right knee. D/c'd oxycodone- not requiring 4. Mood: LCSW to follow for evaluation and support.              -antipsychotic agents: N/A 5. Neuropsych: This patient is capable of making decisions on his own behalf. 6. Skin/Wound Care: Routine pressure relief measures.  7. Fluids/Electrolytes/Nutrition: Monitor I/O. CMET reviewed and stable 8. Right ankle bimalleolar fx, lateral> medial Fx: WBAT with CAM boot. Wife apparently interested in finding out what ortho plan is for right ankle ( Dr. Stann Mainland).repeat xrays show no callus formation but good alignment -  Injury was on Jan 5th. On Vit D , add Ca++ 9. CAD s/p CABG: On Lipitor  10 A fib: Monitor HR tid--continue Xarelto. Resume metoprolol  11. HTN: Monitor BP tid--has been controlled without medications. Avoid hypoperfusion--out of 5 day window.              --Well controlled: continue metoprolol. Continue to hold Lasix, Diovan and Amlodipine--low dose due to SE)  Vitals:   08/16/20 1931  08/17/20 0542  BP: 132/76 (!) 159/95  Pulse: 60 73  Resp: 18 16  Temp: 98.3 F (36.8 C) 97.7 F (36.5 C)  SpO2: 99% 99%   12.   H/o gout: Managed on allopurinol.  13  diabetes- on metformin low dose          follow up with PCP, Hgb A1C in 3 mo    - CBG (last 3)  Recent Labs    08/16/20 1625 08/16/20 2141 08/17/20 0619  GLUCAP 108* 105* 107*  controlled   14. Vitamin B 12 deficiency: Resume supplement.  15. Left knee OA: Ice TID. He has received neoprene knee sleeve.  Quadriceps strengthening exercises.  16. Obesity (BMI 30.51): He will benefit from dietary counseling.  17. Chronic Phimosis: patient to continue  to reduce nightly.              --Frequency managed with pads.  18. Disposition: plan is for d/c home with wife, who is a former Therapist, sports. He has home wheelchair, grab bar, chair lift is being installed    LOS: 6 days [place] 08/17/2020, 7:53 AM

## 2020-08-18 MED ORDER — DOCUSATE SODIUM 100 MG PO CAPS
100.0000 mg | ORAL_CAPSULE | Freq: Every day | ORAL | Status: DC
  Administered 2020-08-18 – 2020-08-26 (×9): 100 mg via ORAL
  Filled 2020-08-18 (×9): qty 1

## 2020-08-18 MED ORDER — DICLOFENAC SODIUM 1 % EX GEL
2.0000 g | Freq: Four times a day (QID) | CUTANEOUS | Status: DC
  Administered 2020-08-18 – 2020-08-26 (×32): 2 g via TOPICAL
  Filled 2020-08-18: qty 100

## 2020-08-18 NOTE — Progress Notes (Signed)
Patient ID: Shane Espinoza, male   DOB: 09/22/1940, 80 y.o.   MRN: [id number]   Family education scheduled February 21st, 1-3 PM  [place], [place]

## 2020-08-18 NOTE — Progress Notes (Signed)
Occupational Therapy Session Note  Patient Details  Name: Shane Espinoza MRN: [id number] Date of Birth: 1941-05-26  Today's Date: 08/18/2020 OT Individual Time: 1113-1207 OT Individual Time Calculation (min): 54 min    Short Term Goals: Week 1:  OT Short Term Goal 1 (Week 1): Pt will complete toilet transfer with CGA + AE PRN OT Short Term Goal 2 (Week 1): Pt will don pants with min A + AE PRN. OT Short Term Goal 3 (Week 1): Pt will stand to complete grooming task for at least 2 min with min A  Skilled Therapeutic Interventions/Progress Updates:    Pt sitting up in the wheelchair with spouse present.  Began session with discussion of expected discharge date, which they were both in agreement.  Discussion was then made about follow-up recommendations and expected discharge level.  Recommended continued rehab at discharge at outpatient level at this time to continue working on ADL independence and LUE strengthening.  Began family education with spouse and had pt complete functional mobility to the toilet with use of the RW for support.  He was then able to complete toilet hygiene and clothing with min assist sit to stand.  His spouse assisted him with transfer from the bathroom door after sit to stand with min assist to the sink for washing his hands.  He was then able to complete transfer back to the wheelchair at min assist.  Finished session with transfer from the wheelchair to the bedside recliner with min assist.  Pt left with spouse in room and call button and phone in reach.    Therapy Documentation Precautions:  Precautions Precautions: Fall Precaution Comments: Rt [place] when OOB, RLE WBAT, R knee sleeve, L hemiparesis/lean Required Braces or Orthoses: Other Brace Other Brace: fall in January resulting in R ankle fx; Patient reports receiving "knee sleeve" to R knee to "help prevent knee buckling" Restrictions Weight Bearing Restrictions: No RLE Weight Bearing: Weight bearing  as tolerated Other Position/Activity Restrictions: with [place]  Pain: Pain Assessment Pain Scale: Faces Pain Score: 0-No pain ADL: See Care Tool Section for some details of mobility and selfcare  Therapy/Group: Individual Therapy  Vraj Denardo OTR/L 08/18/2020, 12:32 PM

## 2020-08-18 NOTE — Progress Notes (Signed)
Shane Espinoza PHYSICAL MEDICINE & REHABILITATION PROGRESS NOTE   Subjective/Complaints: Pt  C/o constipation but had BM this am, no abd pain RIght knee pain with steps, hx OA , may have twisted last month during fall Labs and xrays reviewed   ROS: Patient denies CP, SOB,  N/V/D  Objective:   DG Ankle Complete Right  Result Date: 08/16/2020 CLINICAL DATA:  Fracture. EXAM: RIGHT ANKLE - COMPLETE 3+ VIEW COMPARISON:  07/08/2020 FINDINGS: Two view exam again shows the oblique fracture of the distal fibula. No evidence for bridging bony callus. Ankle mortise is largely preserved. Age indeterminate avulsion injury noted medial malleolus. IMPRESSION: 1. Oblique fracture of the distal fibula is still visible without evidence for bridging bony callus. 2. Age indeterminate avulsion injury of the medial malleolus. Electronically Signed   By: Misty Stanley M.D.   On: 08/16/2020 16:15   Recent Labs    08/16/20 0547  WBC 8.3  HGB 14.3  HCT 43.6  PLT 179   Recent Labs    08/16/20 0547  NA 137  K 3.8  CL 104  CO2 23  GLUCOSE 111*  BUN 21  CREATININE 1.15  CALCIUM 8.9    Intake/Output Summary (Last 24 hours) at 08/18/2020 8413 Last data filed at 08/18/2020 0615 Gross per 24 hour  Intake 327 ml  Output 650 ml  Net -323 ml        Physical Exam: Vital Signs Blood pressure 123/84, pulse 63, temperature 98.1 F (36.7 C), resp. rate 18, height 5\' 9"  (1.753 m), weight 97.7 kg, SpO2 99 %.   General: No acute distress Mood and affect are appropriate Heart: Regular rate and rhythm no rubs murmurs or extra sounds Lungs: Clear to auscultation, breathing unlabored, no rales or wheezes Abdomen: Positive bowel sounds, soft nontender to palpation, nondistended Extremities: No clubbing, cyanosis, or edema Skin: No evidence of breakdown, no evidence of rash   Neuro:Alert and oriented x 3. Normal insight and awareness. Intact Memory. Normal language and speech. Cranial nerve exam unremarkable   Speech clear. Able to follow simple one step commands. Left sided weakness noted.  4/5 throughout left arm/leg, RUE/RLE 5/5 except Right ankle NT due to CAM walker  Musc: right knee tender with ROM  Assessment/Plan: 1. Functional deficits which require 3+ hours per day of interdisciplinary therapy in a comprehensive inpatient rehab setting.  Physiatrist is providing close team supervision and 24 hour management of active medical problems listed below.  Physiatrist and rehab team continue to assess barriers to discharge/monitor patient progress toward functional and medical goals  Care Tool:  Bathing    Body parts bathed by patient: Right arm,Left arm,Chest,Abdomen,Front perineal area,Buttocks,Right upper leg,Left upper leg,Face,Left lower leg,Right lower leg   Body parts bathed by helper: Left lower leg,Right lower leg,Buttocks     Bathing assist Assist Level: Moderate Assistance - Patient 50 - 74%     Upper Body Dressing/Undressing Upper body dressing   What is the patient wearing?: Pull over shirt    Upper body assist Assist Level: Supervision/Verbal cueing    Lower Body Dressing/Undressing Lower body dressing      What is the patient wearing?: Pants,Underwear/pull up     Lower body assist Assist for lower body dressing: Minimal Assistance - Patient > 75%     Toileting Toileting    Toileting assist Assist for toileting: Set up assist     Transfers Chair/bed transfer  Transfers assist     Chair/bed transfer assist level: Minimal Assistance - Patient >  75% Chair/bed transfer assistive device: Museum/gallery exhibitions officer assist      Assist level: Contact Guard/Touching assist Assistive device: Walker-rolling Max distance: 118ft   Walk 10 feet activity   Assist     Assist level: Contact Guard/Touching assist Assistive device: Walker-rolling   Walk 50 feet activity   Assist    Assist level: Contact Guard/Touching  assist Assistive device: Walker-rolling    Walk 150 feet activity   Assist Walk 150 feet activity did not occur: Safety/medical concerns  Assist level: Minimal Assistance - Patient > 75% Assistive device: Walker-rolling    Walk 10 feet on uneven surface  activity   Assist Walk 10 feet on uneven surfaces activity did not occur: Safety/medical concerns         Wheelchair     Assist Will patient use wheelchair at discharge?: No             Wheelchair 50 feet with 2 turns activity    Assist            Wheelchair 150 feet activity     Assist          Blood pressure 123/84, pulse 63, temperature 98.1 F (36.7 C), resp. rate 18, height 5\' 9"  (1.753 m), weight 97.7 kg, SpO2 99 %.  Medical Problem List and Plan: 1.  Impaired mobility and ADLs secondary to right sided acute subcortical  CVA and right sided ankle fracture             -patient may shower             -ELOS/Goals: 10-14 days modI   Continue CIR 2.  Antithrombotics: -DVT/anticoagulation:  Pharmaceutical: Xarelto             -antiplatelet therapy: N/A 3. Pain Management: Continue icing right knee. D/c'd oxycodone- not requiring Severe tricompartmental Right knee OA, voltaren gel  4. Mood: LCSW to follow for evaluation and support.              -antipsychotic agents: N/A 5. Neuropsych: This patient is capable of making decisions on his own behalf. 6. Skin/Wound Care: Routine pressure relief measures.  7. Fluids/Electrolytes/Nutrition: Monitor I/O. CMET reviewed and stable 8. Right ankle bimalleolar fx, lateral> medial Fx: WBAT with CAM boot. Wife apparently interested in finding out what ortho plan is for right ankle ( Dr. Stann Mainland).repeat xrays show no callus formation but good alignment -  Injury was on Jan 5th. On Vit D , add Ca++ 9. CAD s/p CABG: On Lipitor  10 A fib: Monitor HR tid--continue Xarelto. Resume metoprolol  11. HTN: Monitor BP tid--has been controlled without medications.  Avoid hypoperfusion--out of 5 day window.              --Well controlled: continue metoprolol. Continue to hold Lasix, Diovan and Amlodipine--low dose due to SE)  Vitals:   08/18/20 0535 08/18/20 0631  BP: (!) 135/104 123/84  Pulse: 86 63  Resp: 18   Temp: 98.1 F (36.7 C)   SpO2: 99%    12.   H/o gout: Managed on allopurinol.  13  diabetes- on metformin low dose          follow up with PCP, Hgb A1C in 3 mo    - CBG (last 3)  Recent Labs    08/16/20 1625 08/16/20 2141 08/17/20 0619  GLUCAP 108* 105* 107*  controlled 2/16  14. Vitamin B 12 deficiency: Resume supplement.  15. Left knee OA: Ice  TID. He has received neoprene knee sleeve.  Quadriceps strengthening exercises.  16. Obesity (BMI 30.51): He will benefit from dietary counseling.  17. Chronic Phimosis: patient to continue to reduce nightly.              --Frequency managed with pads.  18. Disposition: plan is for d/c home with wife, who is a former Therapist, sports. He has home wheelchair, grab bar, chair lift is being installed  19.  Constipation add colace  LOS: 7 days A FACE TO FACE EVALUATION WAS PERFORMED  Charlett Blake 08/18/2020, 8:28 AM

## 2020-08-18 NOTE — Progress Notes (Signed)
Physical Therapy Session Note  Patient Details  Name: Shane Espinoza MRN: [id number] Date of Birth: 04-12-1941  Today's Date: 08/18/2020 PT Individual Time: 0805-0922 PT Individual Time Calculation (min): 77 min   Short Term Goals: Week 1:  PT Short Term Goal 1 (Week 1): Patient will completed sit <> supine transition with no more than CGA consistently PT Short Term Goal 2 (Week 1): Patient will complete sit <> stand with LRAD and no more than MinA consistently PT Short Term Goal 3 (Week 1): Patient will ambulate >75 with LRAD and MinA PT Short Term Goal 4 (Week 1): Patient will negotiate at least 3 steps with U HR use and MinA  Skilled Therapeutic Interventions/Progress Updates:    Pt received siting in w/c with his wife present and pt agreeable to therapy session. Wearing RLE CAM boot. Discussed PT POC, ELOS, and follow-up therapy recommendations with pt and wife in preparation for team conference today. Pt reports feeling some "soreness" from therapy sessions yesterday. Transported to/from gym in w/c for time management and energy conservation. R stand pivot w/c>Nustep using RW CGA for steadying - using armrests to push up. Performed B LE reciprocal movement patterns on Nustep against level 2 resistance (increased to level 4 for last 30 seconds) for 5 min totaling 247 steps. L stand pivot Nustep>w/c using RW, cuing for proper UE placement when coming to stand, and CGA for steadying. Gait training 185ft using RW with CGA progressed towards min assist - starts with reciprocal stepping pattern progressed to step-to leading with R LE and pt off-weighting R LE during stance via increased WBing through UEs - progressively slower gait speed with decreased B LE step lengths and foot clearances due to fatigue. When going to sit in w/c demos poor eccentric control due to fatigue. Provided therapeutic rest break. Ascended/descended 4 steps 2x using R HR via side-stepping pattern - requires min assist  for lifting/lowering - demos significant compensation leaning heavily on HRs that improved during 2nd trial with cuing for increased LE hip/knee extension activation to step-up - would benefit from additional training with this. MD in/out for morning assessment. Throughout session today pt demos more delayed hip/knee extension when coming to stand - anticipate this may be due to fatigue. Stand pivot w/c<>EOM using RW CGA/min assist for steadying/lifting/lowering. Repeated sit<>stands to/from elevated EOM progressed from using UE pushing on mat and RW to B HHA to decrease UE support - level 3 theraband resistance around knees to promoted increased hip abductor activation - x8 reps CGA/min assist for balance - cuing for anterior trunk lean followed by increased hip extension/glute activation. Transported back to room - left sitting in w/c with wife present in preparation for next session.  Therapy Documentation Precautions:  Precautions Precautions: Fall Precaution Comments: Rt Camboot when OOB, RLE WBAT, R knee sleeve, L hemiparesis/lean Required Braces or Orthoses: Other Brace Other Brace: fall in January resulting in R ankle fx; Patient reports receiving "knee sleeve" to R knee to "help prevent knee buckling" Restrictions Weight Bearing Restrictions: No RLE Weight Bearing: Weight bearing as tolerated Other Position/Activity Restrictions: with [place]  Pain: Continues to report R lateral knee "sensitivity" feeling like going to "give way" - MD notified and assessed with pt denying pain to palpation - plan to continue addressing via strengthening for muscular support of joint as pt demos significant weakness.   Therapy/Group: Individual Therapy  Tawana Scale , PT, DPT, CSRS  08/18/2020, 7:48 AM

## 2020-08-18 NOTE — Consult Note (Signed)
Neuropsychological Consultation   Patient:   Shane Espinoza   DOB:   01/12/1941  MR Number:  [id number]  Location:  [facility] [address] CENTER [address] [place] [id number] [place] [address] Dept: Portal: [phone]           Date of Service:   08/18/2020  Start Time:   2 PM End Time:   3 PM  Provider/Observer:  Ilean Skill, Psy.D.       Clinical Neuropsychologist       Billing Code/Service: [id number]  Chief Complaint:    Shane Espinoza is an 80 year old male with history of CHF, CAD status post CABG 2012, CVA 2015, A. fib on Xarelto, fall 07/08/2020 with orthopedic injury.  Had developed left-sided weakness and numbness with fall.  Left lower extremity extremity symptoms did resolve somewhat and MRI head showed focus of restricted diffusion with acute/subacute infarct and periventricular white matter adjacent to right ventricular and incidental moderate parenchymal volume loss.  Neurology felt stroke was due to small vessel disease in a setting of A. fib.  Status post right lower injury and still in cam boot with now residual left upper extremity and left lower extremity weakness with instability, left lateral lean and delayed information processing decreased short-term memory affecting ADLs and mobility.  Reason for Service:  Patient was referred for neuropsychological consultation due to coping and adjustment issues.  Below is the HPI for the coordination.  HPI: Shane [place]. Espinoza is an 80 year old male with history of CAF, CAD s/p CABG 2012, CVA 2015, A fib- on Xarelto,  fall 07/08/20 with nondisplaced right lateral malleolus and adjacent avulsion fracture of medial malleolus who developed left sided weakness and numbness with fall. UDS negative. LLE symptoms resolved and MRI head showed focus of restricted diffusion with acute/subacute infarct in periventricular white matter adjacent to right  ventricle and incidental moderate parenchymal volume loss.  2D echo showed EF 55-60%, mild to moderate aortic valve sclerosis and grade 1 DD. Carotid dopplers were negative for significant ICA stenosis. Dr. Leonie Man felt that stroke was due to small vessel disease and educated patient on importance of taking medications at same time with the largest meal of the day. Patient continues to require CAM boot on RLE--WBAT and now has resultant LUE weakness with instability, left lateral lean and delayed processing with decrease STM affecting ADLs and mobility. CIR recommended given left sided weakness and right sided ankle fracture.  Current Status:  Patient was awake and alert sitting in recliner chair in the upright position with wife present.  Patient had some articulation errors and some mild word finding difficulties noted but nothing severe or significant.  Patient reports that his 2015 CVA resolved fairly quickly with no long-term residual effects.  Patient has been having significant reduction in physical activity month prior to the stroke and had just been released for more activity with rolling walker from orthopedist the day before he had the stroke.  Patient has some past history of anxiety most notably around issues related to his medical status and various medical issues that he has been having to cope with.  Patient been trying to be more active that day but reports that he was developing symptoms before the fall and that the symptoms likely were related to early stages of his stroke causing his fall.  Patient's memory and learning appear to be intact with adequate expressive language and some mild word finding issues.  Patient denied any significant depression or anxiety that would overtly affect his ability to participate in therapeutic interventions.  However, there were some short-term memory and recall issues noted but mild in nature.  Patient and wife with lots of questions about decision-making going  forward and risk-benefit of various orthopedic or other medical interventions that may be needed.  Behavioral Observation: Shane Espinoza  presents as a 80 y.o.-year-old Right Caucasian Male who appeared his stated age. his dress was Appropriate and he was Well Groomed and his manners were Appropriate to the situation.  his participation was indicative of Appropriate and Redirectable behaviors.  There were physical disabilities noted.  he displayed an appropriate level of cooperation and motivation.     Interactions:    Active Appropriate  Attention:   abnormal and attention span appeared shorter than expected for age  Memory:   within normal limits; recent and remote memory intact  Visuo-spatial:  not examined  Speech (Volume):  normal  Speech:   normal; some mild word finding symptoms noted.  Thought Process:  Coherent and Relevant  Though Content:  WNL; not suicidal and not homicidal  Orientation:   person, place, time/date and situation  Judgment:   Good  Planning:   Fair  Affect:    Appropriate  Mood:    Dysphoric  Insight:   Good  Intelligence:   high  Medical History:   Past Medical History:  Diagnosis Date  . Anxiety   . Atrial fibrillation ([place])   . Biceps tendon tear    right  . CAD (coronary artery disease)    [name]  . Gout   . Hypertension   . 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   [name]         Patient Active Problem List   Diagnosis Date Noted  . Stroke (cerebrum) ([place]) 08/11/2020  . Acute CVA (cerebrovascular accident) ([place]) 08/06/2020  . TIA (transient ischemic attack) 08/05/2020  . Gout 12/07/2013  . Atrial fibrillation ([place]) 12/07/2013  . Acute thrombotic stroke ([place]) 09/23/2013  . CVA (cerebral infarction) 09/23/2013  . HTN (hypertension) 04/16/2013  . Hyperlipidemia with target LDL less than 70 04/16/2013  . CAD (coronary artery disease)   . Anxiety   . Hypertension   . S/P CABG x 4          Psychiatric History:  Patient  with some past history of anxiety exacerbated by medical issues and recent right ankle injury.  No psychotropic medications noted.  Family Med/Psych History:  Family History  Problem Relation Age of Onset  . CVA Father   . Heart attack Maternal Grandmother   . Heart attack Maternal Grandfather   . Heart murmur Sister   . Hypertension Son    Impression/DX:  Shane Espinoza is an 80 year old male with history of CHF, CAD status post CABG 2012, CVA 2015, A. fib on Xarelto, fall 07/08/2020 with orthopedic injury.  Had developed left-sided weakness and numbness with fall.  Left lower extremity extremity symptoms did resolve somewhat and MRI head showed focus of restricted diffusion with acute/subacute infarct and periventricular white matter adjacent to right ventricular and incidental moderate parenchymal volume loss.  Neurology felt stroke was due to small vessel disease in a setting of A. fib.  Status post right lower injury and still in cam boot with now residual left upper extremity and left lower extremity weakness with instability, left lateral lean and delayed information processing decreased short-term memory affecting ADLs and mobility.  Patient was awake and alert sitting in recliner chair in the upright position with wife present.  Patient had some articulation errors and some mild word finding difficulties noted but nothing severe or significant.  Patient reports that his 2015 CVA resolved fairly quickly with no long-term residual effects.  Patient has been having significant reduction in physical activity month prior to the stroke and had just been released for more activity with rolling walker from orthopedist the day before he had the stroke.  Patient has some past history of anxiety most notably around issues related to his medical status and various medical issues that he has been having to cope with.  Patient been trying to be more active that day but reports that he was developing  symptoms before the fall and that the symptoms likely were related to early stages of his stroke causing his fall.  Patient's memory and learning appear to be intact with adequate expressive language and some mild word finding issues.  Patient denied any significant depression or anxiety that would overtly affect his ability to participate in therapeutic interventions.  However, there were some short-term memory and recall issues noted but mild in nature.  Patient and wife with lots of questions about decision-making going forward and risk-benefit of various orthopedic or other medical interventions that may be needed.  Disposition/Plan:  Today we worked on coping and adjustment issues and answering numerous questions from both patient and wife.  They are both very pleasant and appropriate.  Patient denied significant anxiety keeping him from therapeutic process but notes that he has been having difficulties with coping and adjustment.  Diagnosis:    Acute CVA (cerebrovascular accident) [facility]) - Plan: Ambulatory referral to Physical Medicine Rehab, Ambulatory referral to Neurology  Follow-up exam - Plan: DG Ankle Complete Right, DG Ankle Complete Right         Electronically Signed   _______________________ Ilean Skill, Psy.D. Clinical Neuropsychologist

## 2020-08-18 NOTE — Progress Notes (Signed)
Physical Therapy Session Note  Patient Details  Name: Shane Espinoza MRN: [id number] Date of Birth: [birth year]  Today's Date: 08/18/2020 PT Individual Time: 0922-1003; 1500-1530 PT Individual Time Calculation (min): 41 min and 30 mins  Short Term Goals: Week 1:  PT Short Term Goal 1 (Week 1): Patient will completed sit <> supine transition with no more than CGA consistently PT Short Term Goal 2 (Week 1): Patient will complete sit <> stand with LRAD and no more than MinA consistently PT Short Term Goal 3 (Week 1): Patient will ambulate >75 with LRAD and MinA PT Short Term Goal 4 (Week 1): Patient will negotiate at least 3 steps with U HR use and MinA  Skilled Therapeutic Interventions/Progress Updates:    Session 1: Patient received sitting up in wc, agreeable to PT. He denies pain. PT propelling patient to therapy gym for time management and energy conservation. PT providing patient with HEP, reviewing exercises and emphasizing pain- free ROM for ankle and knee exercises. Patient and wife verbalized understanding. Patient able to doff CAM boot with supervision and Min verbal cues. PT observing blanchable red spot to R lateral 5th toe. RN aware. Increased swelling to distal R LE as well. Patient completing the following therex: LAQ, marches, hip abd, 3x10. Patient returning to room in wc, wife at bedside, call light within reach.    Session 2: Patient received in bathroom, wife in room, agreeable to assist and PT session. He denies pain. Patient continent of bladder. He required ModA to stand up from low surface of toilet using grab bar. Supervision for clothing management in standing. He was able to ambulate ~68ft to wc with RW and CGA. PT propelling patient in wc to therapy gym for time management and energy conservation. He was able to complete BITS tasks reaching outside BOS in standing with RW and close supervision. Patient able to maintain equal weight bearing between B LE with no  evidence of R knee buckling or L lateral lean. Patient ambulating back to room with RW and CGA. Patient remaining up in recliner, call light within reach, wife at bedside.   Therapy Documentation Precautions:  Precautions Precautions: Fall Precaution Comments: Rt Camboot when OOB, RLE WBAT, R knee sleeve, L hemiparesis/lean Required Braces or Orthoses: Other Brace Other Brace: fall in January resulting in R ankle fx; Patient reports receiving "knee sleeve" to R knee to "help prevent knee buckling" Restrictions Weight Bearing Restrictions: No RLE Weight Bearing: Weight bearing as tolerated Other Position/Activity Restrictions: with Camb[place]    Therapy/Group: Individual Therapy  Karoline Caldwell, PT, DPT, CBIS  08/18/2020, 7:35 AM

## 2020-08-18 NOTE — Progress Notes (Signed)
Patient ID: Shane Espinoza, male   DOB: [birth year], 80 y.o.   MRN: [id number] Team Conference Report to Patient/Family  Team Conference discussion was reviewed with the patient and caregiver, including goals, any changes in plan of care and target discharge date.  Patient and caregiver express understanding and are in agreement.  The patient has a target discharge date of 08/26/20.  [name] 08/18/2020, 2:00 PM

## 2020-08-19 NOTE — Progress Notes (Signed)
Physical Therapy Session Note  Patient Details  Name: Shane Espinoza MRN: [id number] Date of Birth: 1941/06/12  Today's Date: 08/19/2020 PT Individual Time: 0806-0900 and 4098-1191 PT Individual Time Calculation (min): 54 min and 66 min  Short Term Goals: Week 2:  PT Short Term Goal 1 (Week 2): = to LTGs based on ELOS  Skilled Therapeutic Interventions/Progress Updates:    Session 1: Pt received sitting in w/c wearing R CAM boot ready and eager to participate in therapy session.  Transported to/from gym in w/c for time management and energy conservation. Stand pivot w/c<>Nustep using RW CGA for steadying - demos improved trunk/hip/knee extension coming to stand compared to yesterday but still partially delayed. Performed B LE reciprocal movement patterns on Nustep targeting endurance and B LE strengthening against level 4 resistance for 5 minutes totaling 282 steps - pt reports Borg RPE of 13. Gait training 168ft using RW with CGA - continues to have decreased gait speed starting with reciprocal gait pattern progressing towards more of a step-to pattern leading with R LE - continues to compensate via B UE WBing through RW during R LE stance time (though continues to deny R ankle pain) - 1x slight R knee giving way, pt able to compensate with B UE support on RW without increased assist.  Ascended/descended 4 steps using R HR sidestepping technique 3x - 1st trial: min assist for lifting during ascent with continued lack of muscle power through L LE into hip/knee extension and then min assist for controlled descent on last step with pt running out of handrial and not able to compensate with B UE support; reports R LE knee soreness on descent.  - 2nd trial: on descent had pt make sure to fully face sideways towards rail as opposed to being half turned to improve RLE knee alignment and pt reporting decreased soreness - cuing for improved LLE muscle activation on ascent - 3rd trial: pt demonstrates  improved powering up through L LE on ascent and improved coordination of LE stepping and positioning on steps; discussed option of having pt use RW to step down from last step as opposed to using HRs if felt he didn't have enough support Pt hand-off to [place], [place], to assume care of patient.  Session 2: Pt received sitting in w/c and eager to participate in therapy session. Wearing R LE CAM boot. Transported to/from gym in w/c for time management and energy conservation. Sit<>stands using RW with CGA for steadying throughout session - continues to require cuing for proper UE placement when using AD >75% of the time. Gait training ~6ft using RW, CGA for safety - demos 1x slight R knee giving way but pt able to recover using B UE support on RW without increased assist - demos slight reciprocal gait pattern with decreased step lengths and slower gait speed compared to AM likely due to fatigue. Repeated sit<>stands to/from elevated mat, B HHA to decrease UE support, with CGA/min assist for steadying - level 3 theraband around knees to increase hip abductor activation 2x8 reps - cuing for incresaed anterior trunk flexion/weight shift followed by hip extension activation. Standing B UE support on RW repeated L/R LE foot taps on/off 6" step 2x15 reps each LE - CGA for steadying balance - continues to demo B UE WBing compensation when standing on R LE, improved during 2nd set. Seated R/L LE foot taps on 6" step working on hip flexor strengthening through smaller ROM as pt often compensates using UEs to lift legs  to place on w/c leg rests - cuing for pt not to compensate with this during session. Stand pivot to w/c using RW CGA. Simulated car transfer (SUV height) using RW with pt demonstrating safe technique turning to sit hips down prior to placing LEs in vehicle - CGA for steadying/safety. Gait training ~41ft up/down ramp using RW, CGA for safety - cuing for shorter steps and close AD management for safety. L LE  strengthening targeting improved stair navigation via repeated L LE forward stepping up/down on/off 1st 6" height step using B HR support - CGA for steadying - cuing for powering through L LE into hip/knee extension x12 reps with pt demonstrating significant improvement. Translated this movement into L LE lateral step up/down on/off 1st 6" step using B UE support on R HR (as pt planning to perform lateral stair navigation at home) x10 reps with pt demonstrating significant improvement in upright posture and L LE muscle recruitment requiring decreased min assist. Transported back to room and requests to sit in recliner. Short distance ambulatory transfer w/c>recliner using RW with CGA for steadying/safety. Pt left sitting in recliner with needs in reach and his wife present.  Therapy Documentation Precautions:  Precautions Precautions: Fall Precaution Comments: Rt Camb[place] when OOB, RLE WBAT, R knee sleeve, L hemiparesis/lean Required Braces or Orthoses: Other Brace Other Brace: fall in January resulting in R ankle fx; Patient reports receiving "knee sleeve" to R knee to "help prevent knee buckling" Restrictions Weight Bearing Restrictions: No RLE Weight Bearing: Weight bearing as tolerated Other Position/Activity Restrictions: with [place]  Pain: Session 1: Reports R knee soreness during stair descent, adjusted technique for pain management with improvement. Otherwise no complaints of pain during session.  Session 2: Continues to deny R ankle pain. Continues to have "twinges" "sensitivity" in R posterolateral knee joint - adjusted interventions and provided rest breaks for pain management.    Therapy/Group: Individual Therapy  Tawana Scale , PT, DPT, CSRS  08/19/2020, 7:47 AM

## 2020-08-19 NOTE — Progress Notes (Signed)
Patient ID: Shane Espinoza, male   DOB: 1940-12-25, 80 y.o.   MRN: [id number] Followed up with the patient and wife regarding CMM counting and diabetic diet modification recommendations. Given handouts on meal recommendation and food choice tips. Margarito Liner

## 2020-08-19 NOTE — Progress Notes (Signed)
Patient ID: Shane Espinoza, male   DOB: [birth year], 80 y.o.   MRN: [id number]   Patient orders faxed to Neuro OP.  [place], [place]

## 2020-08-19 NOTE — Progress Notes (Signed)
Physical Therapy Session Note  Patient Details  Name: Shane Espinoza MRN: [id number] Date of Birth: 10/17/1940  Today's Date: 08/19/2020 PT Individual Time: 1000-1100 PT Individual Time Calculation (min): 60 min   Short Term Goals: Week 1:  PT Short Term Goal 1 (Week 1): Patient will completed sit <> supine transition with no more than CGA consistently PT Short Term Goal 2 (Week 1): Patient will complete sit <> stand with LRAD and no more than MinA consistently PT Short Term Goal 3 (Week 1): Patient will ambulate >75 with LRAD and MinA PT Short Term Goal 4 (Week 1): Patient will negotiate at least 3 steps with U HR use and MinA Week 2:    Week 3:     Skilled Therapeutic Interventions/Progress Updates:      PAIN denies pain  Pt initially oob in wc, handed off from OT.  Eager to begin session.  Transported to gym for energy conservation stand pivot transfer to mat w/RW and cga, additional time.  Sits on edge of mat w/distant supervision.  Therapist removed Cam boot and pt performed R ankle AROM including: DF/PF x 20 Inv/EVE x 20  repeated w/footl on floor to improve isolation 2 x20 each AROM Alphabet x 2 Numbers 1-20 x 2  Cam boot donned by therapist.  Pt sit to supine w/supervision.  Bridging x 20 w/LLE bias position Bridging w/green theraband resistance to abd x 20 Bringing w/isometric add using ball x 20 sidelying L hip abd 2x 20 (clamshell) Supine legs extended/ feet on red theraball - worked on maintaining/controlling midline w/visual and verbal cues Feet on ball glut sets/minibridge w/empasis on maintaining midline for proprioceptive training L Hep and knee flexion extension rolling  Ball w/LLE - emphasis on controlled movement.  [employer], educated pt on benefits for pain control/ROM w/arthritis.  Supine to side to sit w/cues for technique to decrease abdominal activation due to large abdominal hernia.  stand pivot transfer to wc w/RW and cga. Pt  transported back to room at end of session.  Pt left oob in wc w/alarm belt set and needs in reach   Therapy Documentation Precautions:  Precautions Precautions: Fall Precaution Comments: Rt [place] when OOB, RLE WBAT, R knee sleeve, L hemiparesis/lean Required Braces or Orthoses: Other Brace Other Brace: fall in January resulting in R ankle fx; Patient reports receiving "knee sleeve" to R knee to "help prevent knee buckling" Restrictions Weight Bearing Restrictions: No RLE Weight Bearing: Weight bearing as tolerated Other Position/Activity Restrictions: with [place]    Therapy/Group: Individual Therapy  Callie Fielding, [place] 08/19/2020, 12:38 PM

## 2020-08-19 NOTE — Progress Notes (Addendum)
[facility] PHYSICAL MEDICINE & REHABILITATION PROGRESS NOTE   Subjective/Complaints:  No issues overnite, bowels moving better   ROS: Patient denies CP, SOB,  N/V/D  Objective:   No results found. No results for input(s): WBC, HGB, HCT, PLT in the last 72 hours. No results for input(s): NA, K, CL, CO2, GLUCOSE, BUN, CREATININE, CALCIUM in the last 72 hours.  Intake/Output Summary (Last 24 hours) at 08/19/2020 0759 Last data filed at 08/19/2020 0739 Gross per 24 hour  Intake 600 ml  Output 675 ml  Net -75 ml        Physical Exam: Vital Signs Blood pressure (!) 150/90, pulse 78, temperature 97.7 F (36.5 C), temperature source Oral, resp. rate 18, height 5\' 9"  (1.753 m), weight 97.7 kg, SpO2 98 %.   General: No acute distress Mood and affect are appropriate Heart: Regular rate and rhythm no rubs murmurs or extra sounds Lungs: Clear to auscultation, breathing unlabored, no rales or wheezes Abdomen: Positive bowel sounds, soft nontender to palpation, nondistended Extremities: No clubbing, cyanosis, or edema Skin: No evidence of breakdown, no evidence of rash mmands. Left sided weakness noted.  4/5 throughout left arm/leg, RUE/RLE 5/5 except Right ankle NT due to CAM walker  Musc: right knee tender with ROM  Assessment/Plan: 1. Functional deficits which require 3+ hours per day of interdisciplinary therapy in a comprehensive inpatient rehab setting.  Physiatrist is providing close team supervision and 24 hour management of active medical problems listed below.  Physiatrist and rehab team continue to assess barriers to discharge/monitor patient progress toward functional and medical goals  Care Tool:  Bathing    Body parts bathed by patient: Right arm,Left arm,Chest,Abdomen,Front perineal area,Buttocks,Right upper leg,Left upper leg,Face,Left lower leg,Right lower leg   Body parts bathed by helper: Left lower leg,Right lower leg,Buttocks     Bathing assist Assist  Level: Moderate Assistance - Patient 50 - 74%     Upper Body Dressing/Undressing Upper body dressing   What is the patient wearing?: Pull over shirt    Upper body assist Assist Level: Supervision/Verbal cueing    Lower Body Dressing/Undressing Lower body dressing      What is the patient wearing?: Pants,Underwear/pull up     Lower body assist Assist for lower body dressing: Minimal Assistance - Patient > 75%     Toileting Toileting    Toileting assist Assist for toileting: Minimal Assistance - Patient > 75%     Transfers Chair/bed transfer  Transfers assist     Chair/bed transfer assist level: Minimal Assistance - Patient > 75% Chair/bed transfer assistive device: Therapist, sports   Ambulation assist      Assist level: Minimal Assistance - Patient > 75% Assistive device: Walker-rolling Max distance: 175ft   Walk 10 feet activity   Assist     Assist level: Contact Guard/Touching assist Assistive device: Walker-rolling   Walk 50 feet activity   Assist    Assist level: Contact Guard/Touching assist Assistive device: Walker-rolling    Walk 150 feet activity   Assist Walk 150 feet activity did not occur: Safety/medical concerns  Assist level: Minimal Assistance - Patient > 75% Assistive device: Walker-rolling    Walk 10 feet on uneven surface  activity   Assist Walk 10 feet on uneven surfaces activity did not occur: Safety/medical concerns         Wheelchair     Assist Will patient use wheelchair at discharge?: No  Wheelchair 50 feet with 2 turns activity    Assist            Wheelchair 150 feet activity     Assist          Blood pressure (!) 150/90, pulse 78, temperature 97.7 F (36.5 C), temperature source Oral, resp. rate 18, height 5\' 9"  (1.753 m), weight 97.7 kg, SpO2 98 %.  Medical Problem List and Plan: 1.  Impaired mobility and ADLs secondary to right sided  acute subcortical  CVA and right sided ankle fracture             -patient may shower             -ELOS/Goals: 10-14 days modI   Continue CIR 2.  Antithrombotics: -DVT/anticoagulation:  Pharmaceutical: Xarelto             -antiplatelet therapy: N/A 3. Pain Management: Continue icing right knee. D/c'd oxycodone- not requiring Severe tricompartmental Right knee OA, voltaren gel  4. Mood: LCSW to follow for evaluation and support.              -antipsychotic agents: N/A 5. Neuropsych: This patient is capable of making decisions on his own behalf. 6. Skin/Wound Care: Routine pressure relief measures.  7. Fluids/Electrolytes/Nutrition: Monitor I/O. CMET reviewed and stable 8. Right ankle bimalleolar fx, lateral> medial Fx: WBAT with CAM boot. Wife apparently interested in finding out what ortho plan is for right ankle ( Dr. Stann Mainland).repeat xrays show no callus formation but good alignment -  Injury was on Jan 5th. On Vit D , add Ca++ 9. CAD s/p CABG: On Lipitor  10 A fib: Monitor HR tid--continue Xarelto. Resume metoprolol  11. HTN: Monitor BP tid--has been controlled without medications. Avoid hypoperfusion--out of 5 day window.              --Well controlled: continue metoprolol. Continue to hold Lasix, Diovan and Amlodipine--low dose due to SE)  Vitals:   08/18/20 1944 08/19/20 0555  BP: 130/77 (!) 150/90  Pulse: (!) 57 78  Resp: 17 18  Temp: 98.1 F (36.7 C) 97.7 F (36.5 C)  SpO2: 99% 98%  some lability , monitor prior to changes 12.   H/o gout: Managed on allopurinol.  13  diabetes- on metformin low dose          follow up with PCP, Hgb A1C in 3 mo    - CBG (last 3)  Recent Labs    08/16/20 1625 08/16/20 2141 08/17/20 0619  GLUCAP 108* 105* 107*  controlled 2/17  14. Vitamin B 12 deficiency: Resume supplement.  15. Left knee OA: Ice TID. He has received neoprene knee sleeve.  Quadriceps strengthening exercises.  16. Obesity (BMI 30.51): He will benefit from dietary  counseling.  17. Chronic Phimosis: patient to continue to reduce nightly.              --Frequency managed with pads.  18. Disposition: plan is for d/c home with wife, who is a former Therapist, sports. He has home wheelchair, grab bar, chair lift is being installed  19.  Constipation add colace  LOS: 8 days A FACE TO FACE EVALUATION WAS PERFORMED  Charlett Blake 08/19/2020, 7:59 AM

## 2020-08-19 NOTE — Progress Notes (Signed)
Physical Therapy Weekly Progress Note  Patient Details  Name: NETANEL YANNUZZI MRN: [id number] Date of Birth: 15-Nov-1940  Beginning of progress report period: August 12, 2020 End of progress report period: August 19, 2020  Patient has met 4 of 4 short term goals. Mr. Chohan is progressing well with therapy demonstrating increasing independence with functional mobility. He is highly motivated to participate in therapy sessions and is performing supine<>sit with CGA, sit<>stand and stand pivot transfers using RW with CGA/min assist, ambulating up to 156f using RW with CGA/min assist, and ascending/descending 4 steps using R HR with min assist. He demonstrates significant B LE functional strength impairments (L LE due to CVA and R LE due to orthopedic injury) though progressing well. Pt will benefit from continued high intensity therapy in CIR setting in preparation for anticipated D/C on 2/24.  Patient continues to demonstrate the following deficits muscle weakness and muscle joint tightness, decreased cardiorespiratoy endurance, impaired timing and sequencing, unbalanced muscle activation and decreased motor planning and decreased standing balance, decreased postural control and decreased balance strategies and therefore will continue to benefit from skilled PT intervention to increase functional independence with mobility.   Patient progressing toward long term goals..  Continue plan of care.  PT Short Term Goals Week 1:  PT Short Term Goal 1 (Week 1): Patient will completed sit <> supine transition with no more than CGA consistently PT Short Term Goal 1 - Progress (Week 1): Met PT Short Term Goal 2 (Week 1): Patient will complete sit <> stand with LRAD and no more than MinA consistently PT Short Term Goal 2 - Progress (Week 1): Met PT Short Term Goal 3 (Week 1): Patient will ambulate >75 with LRAD and MinA PT Short Term Goal 3 - Progress (Week 1): Met PT Short Term Goal 4 (Week  1): Patient will negotiate at least 3 steps with U HR use and MinA PT Short Term Goal 4 - Progress (Week 1): Met Week 2:  PT Short Term Goal 1 (Week 2): = to LTGs based on ELOS  Skilled Therapeutic Interventions/Progress Updates:  Ambulation/gait training;Discharge planning;Functional mobility training;Psychosocial support;Therapeutic Activities;Visual/perceptual remediation/compensation;Balance/vestibular training;Disease management/prevention;Neuromuscular re-education;Skin care/wound management;Therapeutic Exercise;Wheelchair propulsion/positioning;Cognitive remediation/compensation;DME/adaptive equipment instruction;Pain management;Splinting/orthotics;UE/LE Strength taining/ROM;Community reintegration;Functional electrical stimulation;Stair training;Patient/family education;UE/LE Coordination activities   Therapy Documentation Precautions:  Precautions Precautions: Fall Precaution Comments: Rt [place] when OOB, RLE WBAT, R knee sleeve, L hemiparesis/lean Required Braces or Orthoses: Other Brace Other Brace: fall in January resulting in R ankle fx; Patient reports receiving "knee sleeve" to R knee to "help prevent knee buckling" Restrictions Weight Bearing Restrictions: No RLE Weight Bearing: Weight bearing as tolerated Other Position/Activity Restrictions: with [place], PT, DPT, CSRS  08/19/2020, 7:47 AM

## 2020-08-19 NOTE — Progress Notes (Signed)
Occupational Therapy Weekly Progress Note  Patient Details  Name: Shane Espinoza MRN: [id number] Date of Birth: [birth year]  Beginning of progress report period: August 12, 2020 End of progress report period: August 19, 2020  Today's Date: 08/19/2020 OT Individual Time: 2355-7322 OT Individual Time Calculation (min): 57 min    Patient has met 2 of 3 short term goals.  Shane Espinoza is making steady progress with OT at this time.  He is able to complete UB selfcare with supervision in sitting.  LB selfcare is at a min assist with use of a LH sponge for washing his lower legs and feet.  He is able to donn his own cam boot on the RLE as well as all other clothing with increased time.  Min assist for sit to stand from various surfaces.  He still demonstrates slight weakness on the left side with LOB to the left at times.  Right knee continues to be limited as well secondary to weakness because of orthopedic issues.  Feel overall he has good support from his spouse and is on target to meet supervision level goals.  Will continue with current OT POC with expected discharge on 2/24.    Patient continues to demonstrate the following deficits: muscle weakness and muscle joint tightness and decreased standing balance, hemiplegia and decreased balance strategies and therefore will continue to benefit from skilled OT intervention to enhance overall performance with BADL and Reduce care partner burden.  Patient progressing toward long term goals..  Continue plan of care.  OT Short Term Goals Week 2:  OT Short Term Goal 1 (Week 2): Continue working on [employer] at supervision overall.  Skilled Therapeutic Interventions/Progress Updates:    Pt worked on bathing and dressing during session with his spouse present for education.  He was able to ambulate to the shower seat with min assist using the RW for support.  He then completed doffing clothing with min assist sit to stand.  Shower was  completed with overall min assist including sit to stand with use of the LH sponge for washing his back and his lower legs and feet.  He dried off with therapist providing assist to donn his cam boot on the RLE and shoe on the left to walk out to his wheelchair.  He was able to ambulate with min guard assist.  Once at the chair, he completed all dressing with overall min assist.  UB was performed at supervision with min for LB when completing sit to stand and standing to pull items over his hips.  He was able to thread all clothing and doff and then re-donn his cam boot with increased time and supervision.  Finished session with pt in the wheelchair with PT in to see pt for next session.    Therapy Documentation Precautions:  Precautions Precautions: Fall Precaution Comments: Rt [place] when OOB, RLE WBAT, R knee sleeve, L hemiparesis/lean Required Braces or Orthoses: Other Brace Other Brace: fall in January resulting in R ankle fx; Patient reports receiving "knee sleeve" to R knee to "help prevent knee buckling" Restrictions Weight Bearing Restrictions: No RLE Weight Bearing: Weight bearing as tolerated Other Position/Activity Restrictions: with [place]  Pain: Pain Assessment Pain Scale: Faces Pain Score: 0-No pain ADL: See Care Tool Section for some details of mobility and selfcare  Therapy/Group: Individual Therapy  Timmie Dugue OTR/L 08/19/2020, 12:16 PM

## 2020-08-20 NOTE — Progress Notes (Signed)
Physical Therapy Session Note  Patient Details  Name: Shane Espinoza MRN: [id number] Date of Birth: 1940/12/03  Today's Date: 08/20/2020 PT Individual Time: 1600-1715 PT Individual Time Calculation (min): 75 min   Short Term Goals: Week 2:  PT Short Term Goal 1 (Week 2): = to LTGs based on ELOS  Skilled Therapeutic Interventions/Progress Updates:   Pt received sitting in recliner and agreeable to PT. PT assisted pt to don Cam boot while sitting in recliner with total A.   Stand pivot transfer to [facility] with RW and CGA. Pt performed WC mobility through hall and over cement sidewalk 2 x 160f each with supervision assist and min-mod cues for symmetry of BUE force to prevent veer to the L.   Pt instructed in dynamic balance training with 1-2 UE support while engaged in gross motor task of wii bowling. Pt able to tolerate standing x 10 frames with supervision assist from PT for safety and min=mod cues for use of controller throughout entire game.   Gait training with RW x 235fand 5042fith supervision assist and BUE support on RW. Cues for safety in turns and improved posture as tolerated.   Nustep BLE/BUE activity tolerance training. Level 5, x 6 min +2 min with prolonged therapeutic rest break between bouts due to mild fatigue.   Patient returned to room and performed stand pivot to recliner with RW and supervision assist. Pt left sitting in recliner with call bell in reach and all needs met.         Therapy Documentation Precautions:  Precautions Precautions: Fall Precaution Comments: Rt [place] when OOB, RLE WBAT, R knee sleeve, L hemiparesis/lean Required Braces or Orthoses: Other Brace Other Brace: fall in January resulting in R ankle fx; Patient reports receiving "knee sleeve" to R knee to "help prevent knee buckling" Restrictions Weight Bearing Restrictions: No RLE Weight Bearing: Weight bearing as tolerated Other Position/Activity Restrictions: with [place]    Vital  Signs: Therapy Vitals Temp: 98.5 F (36.9 C) Temp Source: Oral Pulse Rate: (!) 51 Resp: 14 BP: (!) 144/79 Patient Position (if appropriate): Sitting Oxygen Therapy SpO2: 98 % O2 Device: Room Air Pain: denies   Therapy/Group: Individual Therapy  AusLorie Phenix18/2022, 5:20 PM

## 2020-08-20 NOTE — Progress Notes (Signed)
Shane Espinoza PHYSICAL MEDICINE & REHABILITATION PROGRESS NOTE   Subjective/Complaints:  Pain controlled, 2 BMs yesterday . Working on steps with PT , fatigue wit back to back therapy  ROS: Patient denies CP, SOB,  N/V/D  Objective:   No results found. No results for input(s): WBC, HGB, HCT, PLT in the last 72 hours. No results for input(s): NA, K, CL, CO2, GLUCOSE, BUN, CREATININE, CALCIUM in the last 72 hours.  Intake/Output Summary (Last 24 hours) at 08/20/2020 0804 Last data filed at 08/20/2020 0300 Gross per 24 hour  Intake 440 ml  Output 200 ml  Net 240 ml        Physical Exam: Vital Signs Blood pressure 133/81, pulse 74, temperature 98.1 F (36.7 C), temperature source Oral, resp. rate 18, height 5\' 9"  (1.753 m), weight 97.7 kg, SpO2 95 %.    General: No acute distress Mood and affect are appropriate Heart: Regular rate and rhythm no rubs murmurs or extra sounds Lungs: Clear to auscultation, breathing unlabored, no rales or wheezes Abdomen: Positive bowel sounds, soft nontender to palpation, nondistended Extremities: No clubbing, cyanosis, or edema Skin: No evidence of breakdown, no evidence of rash  Motor 4/5 throughout left arm/leg, RUE/RLE 5/5 except Right ankle NT due to CAM walker  Musc: right knee tender with ROM  Assessment/Plan: 1. Functional deficits which require 3+ hours per day of interdisciplinary therapy in a comprehensive inpatient rehab setting.  Physiatrist is providing close team supervision and 24 hour management of active medical problems listed below.  Physiatrist and rehab team continue to assess barriers to discharge/monitor patient progress toward functional and medical goals  Care Tool:  Bathing    Body parts bathed by patient: Right arm,Left arm,Chest,Abdomen,Front perineal area,Buttocks,Right upper leg,Left upper leg,Face,Left lower leg,Right lower leg   Body parts bathed by helper: Left lower leg,Right lower leg,Buttocks      Bathing assist Assist Level: Minimal Assistance - Patient > 75%     Upper Body Dressing/Undressing Upper body dressing   What is the patient wearing?: Pull over shirt    Upper body assist Assist Level: Supervision/Verbal cueing    Lower Body Dressing/Undressing Lower body dressing      What is the patient wearing?: Pants,Underwear/pull up     Lower body assist Assist for lower body dressing: Minimal Assistance - Patient > 75%     Toileting Toileting    Toileting assist Assist for toileting: Minimal Assistance - Patient > 75%     Transfers Chair/bed transfer  Transfers assist     Chair/bed transfer assist level: Contact Guard/Touching assist Chair/bed transfer assistive device: Walker,Armrests   Locomotion Ambulation   Ambulation assist      Assist level: Contact Guard/Touching assist Assistive device: Walker-rolling Max distance: 139ft   Walk 10 feet activity   Assist     Assist level: Contact Guard/Touching assist Assistive device: Walker-rolling   Walk 50 feet activity   Assist    Assist level: Contact Guard/Touching assist Assistive device: Walker-rolling    Walk 150 feet activity   Assist Walk 150 feet activity did not occur: Safety/medical concerns  Assist level: Minimal Assistance - Patient > 75% Assistive device: Walker-rolling    Walk 10 feet on uneven surface  activity   Assist Walk 10 feet on uneven surfaces activity did not occur: Safety/medical concerns         Wheelchair     Assist Will patient use wheelchair at discharge?: No  Wheelchair 50 feet with 2 turns activity    Assist            Wheelchair 150 feet activity     Assist          Blood pressure 133/81, pulse 74, temperature 98.1 F (36.7 C), temperature source Oral, resp. rate 18, height 5\' 9"  (1.753 m), weight 97.7 kg, SpO2 95 %.  Medical Problem List and Plan: 1.  Impaired mobility and ADLs secondary to right  sided acute subcortical  CVA and right sided ankle fracture             -patient may shower             -ELOS/Goals: 10-14 days modI   Continue CIR 2.  Antithrombotics: -DVT/anticoagulation:  Pharmaceutical: Xarelto             -antiplatelet therapy: N/A 3. Pain Management: Continue icing right knee. D/c'd oxycodone- not requiring Severe tricompartmental Right knee OA, voltaren gel  4. Mood: LCSW to follow for evaluation and support.              -antipsychotic agents: N/A 5. Neuropsych: This patient is capable of making decisions on his own behalf. 6. Skin/Wound Care: Routine pressure relief measures.  7. Fluids/Electrolytes/Nutrition: Monitor I/O. CMET reviewed and stable 8. Right ankle bimalleolar fx, lateral> medial Fx: WBAT with CAM boot. Wife apparently interested in finding out what ortho plan is for right ankle ( Dr. Stann Mainland).repeat xrays show no callus formation but good alignment -  Injury was on Jan 5th. On Vit D , add Ca++ 9. CAD s/p CABG: On Lipitor  10 A fib: Monitor HR tid--continue Xarelto. Resume metoprolol  11. HTN: Monitor BP tid--has been controlled without medications. Avoid hypoperfusion--out of 5 day window.              --Well controlled: continue metoprolol. Continue to hold Lasix, Diovan and Amlodipine--low dose due to SE)  Vitals:   08/19/20 2125 08/20/20 0341  BP: 138/77 133/81  Pulse:  74  Resp: 18 18  Temp: 98.2 F (36.8 C) 98.1 F (36.7 C)  SpO2: 96% 95%  some lability , monitor prior to changes 12.   H/o gout: Managed on allopurinol.  13  diabetes- on metformin low dose          follow up with PCP, Hgb A1C in 3 mo    - CBG (last 3)  No results for input(s): GLUCAP in the last 72 hours.controlled 2/17  14. Vitamin B 12 deficiency: Resume supplement.  15. Left knee OA: Ice TID. He has received neoprene knee sleeve.  Quadriceps strengthening exercises.  16. Obesity (BMI 30.51): He will benefit from dietary counseling.  17. Chronic Phimosis: patient to  continue to reduce nightly.              --Frequency managed with pads.  18. Disposition: plan is for d/c home with wife, who is a former Therapist, sports. He has home wheelchair, grab bar, chair lift is being installed  19.  Constipation add colace- had 2 BMs 2/17  LOS: 9 days A FACE TO [place] E Bellina Tokarczyk 08/20/2020, 8:04 AM

## 2020-08-20 NOTE — Progress Notes (Signed)
Occupational Therapy Session Note  Patient Details  Name: Shane Espinoza MRN: [id number] Date of Birth: [birth year]  Today's Date: 08/20/2020 OT Individual Time: 1003-1102 OT Individual Time Calculation (min): 59 min    Short Term Goals: Week 2:  OT Short Term Goal 1 (Week 2): Continue working on [employer] at supervision overall.  Skilled Therapeutic Interventions/Progress Updates:    Pt worked on sit to stand and standing balance with integration of the BITS system this session.  He was able to stand with min assist overall and use of the RW for intervals of 6 mins and 4 mins while engaged in reaching activity with the LUE.  He completed Web designer program with use of the LUE.  He needed increased time for cognitive processing with only 63-65% accuracy and an average reaction time of 7.8-8.5 seconds to find the numbers.  One LOB to the left was noted in standing secondary to fatigue, after being up for approximately four mins during the first attempt.  On the second attempt, he maintained his balance with RUE on the walker and min guard assist.  Transitioned to use of the UE ergonometer for second part of session where he completed 15 mins total with resistance set on level 10.  First 5 mins were completed with BUEs and peddling forward.  RPMs were maintained throughout all sets at 22-25.  He reported slight pain in the right biceps with peddling forward secondary to history of biceps tear about six months ago.  He completed the 2nd set for 5 mins as well peddling backwards using BUEs.  Last 5 mins were completed with isolated LUE use only peddling forward and transitioning to backwards.  Completed session with return to the room and transfer to the toilet with min  using the RW for support.  Min assist for clothing management as well.  He was left with call button in reach and spouse present to call for assist when he was finished.    Therapy  Documentation Precautions:  Precautions Precautions: Fall Precaution Comments: Rt [place] when OOB, RLE WBAT, R knee sleeve, L hemiparesis/lean Required Braces or Orthoses: Other Brace Other Brace: fall in January resulting in R ankle fx; Patient reports receiving "knee sleeve" to R knee to "help prevent knee buckling" Restrictions Weight Bearing Restrictions: No RLE Weight Bearing: Weight bearing as tolerated Other Position/Activity Restrictions: with [place]   Pain: Pain Assessment Pain Scale: Faces Pain Score: 0-No pain ADL: See Care Tool Section for some details of mobility and selfcare  Therapy/Group: Individual Therapy  Axcel Horsch OTR/L 08/20/2020, 12:24 PM

## 2020-08-20 NOTE — Progress Notes (Signed)
Occupational Therapy Session Note  Patient Details  Name: Shane Espinoza MRN: [id number] Date of Birth: 04/02/1941  Today's Date: 08/20/2020 OT Individual Time: 2620-3559 OT Individual Time Calculation (min): 60 min    Short Term Goals: Week 1:  OT Short Term Goal 1 (Week 1): Pt will complete toilet transfer with CGA + AE PRN OT Short Term Goal 1 - Progress (Week 1): Not met OT Short Term Goal 2 (Week 1): Pt will don pants with min A + AE PRN. OT Short Term Goal 2 - Progress (Week 1): Met OT Short Term Goal 3 (Week 1): Pt will stand to complete grooming task for at least 2 min with min A OT Short Term Goal 3 - Progress (Week 1): Met  Skilled Therapeutic Interventions/Progress Updates:    1:1. Pt received in w/c reporting no need to complete BADL. Provided total A transport for time management. Pt completes 5x5 reps of w/c push ups with VC for controlled decent to chair and VC for scap depression when pushing up. Pt completes card matching game seated at [facility] with CGA increasing to MIN A over 18 stands. Pt requires education on noticing and responding to L lean with fatigue for correction. Pt very receptive to feedback. Pt educated on walker tray options during rest breaks for transporting items. Pt requesting to be weighed. Pt transfers onto mat table with CGA and sit to stand on standing scale reading 208 lbs. Pt returned to room and Exited session with pt seated in w/c, exit alarm on and call light in reach and wife in room   Therapy Documentation Precautions:  Precautions Precautions: Fall Precaution Comments: Rt [place] when OOB, RLE WBAT, R knee sleeve, L hemiparesis/lean Required Braces or Orthoses: Other Brace Other Brace: fall in January resulting in R ankle fx; Patient reports receiving "knee sleeve" to R knee to "help prevent knee buckling" Restrictions Weight Bearing Restrictions: No RLE Weight Bearing: Weight bearing as tolerated Other Position/Activity Restrictions:  with [place] General:   Vital Signs: Therapy Vitals Temp: 98.1 F (36.7 C) Temp Source: Oral Pulse Rate: 74 Resp: 18 BP: 133/81 Patient Position (if appropriate): Lying Oxygen Therapy SpO2: 95 % O2 Device: Room Air Pain: Pain Assessment Pain Scale: 0-10 Pain Score: 3  Pain Type: Acute pain Pain Location: Knee Pain Orientation: Right Pain Intervention(s): Medication (See eMAR);Repositioned ADL: ADL Eating: Set up Grooming: Moderate assistance (standing) Where Assessed-Grooming: Standing at sink Upper Body Bathing: Supervision/safety Where Assessed-Upper Body Bathing: Wheelchair Lower Body Bathing: Moderate assistance Where Assessed-Lower Body Bathing: Wheelchair Upper Body Dressing: Supervision/safety Where Assessed-Upper Body Dressing: Wheelchair Lower Body Dressing: Maximal assistance Where Assessed-Lower Body Dressing: Wheelchair Toileting: Minimal assistance Where Assessed-Toileting: Glass blower/designer: Moderate assistance Toilet Transfer Method: Stand pivot Toilet Transfer Equipment: Bedside commode,Grab bars Social research officer, government: Moderate assistance Social research officer, government Method: Radiographer, therapeutic: Engineer, maintenance (IT)    Praxis   Exercises:   Other Treatments:     Therapy/Group: Individual Therapy  Tonny Branch 08/20/2020, 6:59 AM

## 2020-08-20 NOTE — Progress Notes (Signed)
Physical Therapy Session Note  Patient Details  Name: Shane Espinoza MRN: [id number] Date of Birth: January 18, 1941  Today's Date: 08/20/2020 PT Individual Time: 1500-1535 PT Individual Time Calculation (min): 35 min   Short Term Goals: Week 2:  PT Short Term Goal 1 (Week 2): = to LTGs based on ELOS  Skilled Therapeutic Interventions/Progress Updates:    Patient received sitting up in wc, agreeable to PT. He denies pain. PT propelling patient in wc to therapy gym for time management and energy conservation. He was able to ascend/descend 3 steps with R HR use and MinA initially taking steps in step-to fashion. PT educating patient on foot placement to ensure that both feet can fit on step. Educating patient on energy conservation techniques including having sturdy chair at top of stairs to sit in after ascending if needed prior to ambulating into house. Patient able to negotiate stairs x3 again with CGA this time and verbal cues for foot placement. Patient then ambulating back to his room with RW and CGA. Verbal cues for reciprocal stepping and upright posture. Patient returning to recliner, wife at bedside, call light within reach.   Therapy Documentation Precautions:  Precautions Precautions: Fall Precaution Comments: Rt [place] when OOB, RLE WBAT, R knee sleeve, L hemiparesis/lean Required Braces or Orthoses: Other Brace Other Brace: fall in January resulting in R ankle fx; Patient reports receiving "knee sleeve" to R knee to "help prevent knee buckling" Restrictions Weight Bearing Restrictions: No RLE Weight Bearing: Weight bearing as tolerated Other Position/Activity Restrictions: with [place]    Therapy/Group: Individual Therapy  Karoline Caldwell, PT, DPT, CBIS  08/20/2020, 8:27 AM

## 2020-08-21 NOTE — Progress Notes (Signed)
[place] PHYSICAL MEDICINE & REHABILITATION PROGRESS NOTE   Subjective/Complaints: No complaints this morning Wife asks how CBGs should be monitored at home She raves about therapy here He feels therapy has been going very well.  ROS: Patient denies CP, SOB,  N/V/D, pain  Objective:   No results found. No results for input(s): WBC, HGB, HCT, PLT in the last 72 hours. No results for input(s): NA, K, CL, CO2, GLUCOSE, BUN, CREATININE, CALCIUM in the last 72 hours.  Intake/Output Summary (Last 24 hours) at 08/21/2020 1917 Last data filed at 08/21/2020 1843 Gross per 24 hour  Intake 417 ml  Output 675 ml  Net -258 ml        Physical Exam: Vital Signs Blood pressure 122/78, pulse (!) 58, temperature 97.9 F (36.6 C), resp. rate 17, height 5\' 9"  (1.753 m), weight 97.7 kg, SpO2 100 %.   Gen: no distress, normal appearing HEENT: oral mucosa pink and moist, NCAT Cardio: Reg rate Chest: normal effort, normal rate of breathing Abd: soft, non-distended Ext: no edema Psych: pleasant, normal affect Skin: intact  Motor 4/5 throughout left arm/leg, RUE/RLE 5/5 except Right ankle NT due to CAM walker  Musc: right knee tender with ROM  Assessment/Plan: 1. Functional deficits which require 3+ hours per day of interdisciplinary therapy in a comprehensive inpatient rehab setting.  Physiatrist is providing close team supervision and 24 hour management of active medical problems listed below.  Physiatrist and rehab team continue to assess barriers to discharge/monitor patient progress toward functional and medical goals  Care Tool:  Bathing    Body parts bathed by patient: Right arm,Left arm,Chest,Abdomen,Front perineal area,Buttocks,Right upper leg,Left upper leg,Face,Left lower leg,Right lower leg   Body parts bathed by helper: Left lower leg,Right lower leg,Buttocks     Bathing assist Assist Level: Minimal Assistance - Patient > 75%     Upper Body  Dressing/Undressing Upper body dressing   What is the patient wearing?: Pull over shirt    Upper body assist Assist Level: Supervision/Verbal cueing    Lower Body Dressing/Undressing Lower body dressing      What is the patient wearing?: Pants,Underwear/pull up     Lower body assist Assist for lower body dressing: Minimal Assistance - Patient > 75%     Toileting Toileting    Toileting assist Assist for toileting: Minimal Assistance - Patient > 75%     Transfers Chair/bed transfer  Transfers assist     Chair/bed transfer assist level: Contact Guard/Touching assist Chair/bed transfer assistive device: Walker,Armrests   Locomotion Ambulation   Ambulation assist      Assist level: Contact Guard/Touching assist Assistive device: Walker-rolling Max distance: 190ft   Walk 10 feet activity   Assist     Assist level: Contact Guard/Touching assist Assistive device: Walker-rolling   Walk 50 feet activity   Assist    Assist level: Contact Guard/Touching assist Assistive device: Walker-rolling    Walk 150 feet activity   Assist Walk 150 feet activity did not occur: Safety/medical concerns  Assist level: Minimal Assistance - Patient > 75% Assistive device: Walker-rolling    Walk 10 feet on uneven surface  activity   Assist Walk 10 feet on uneven surfaces activity did not occur: Safety/medical concerns         Wheelchair     Assist Will patient use wheelchair at discharge?: No             Wheelchair 50 feet with 2 turns activity    Assist  Wheelchair 150 feet activity     Assist          Blood pressure 122/78, pulse (!) 58, temperature 97.9 F (36.6 C), resp. rate 17, height 5\' 9"  (1.753 m), weight 97.7 kg, SpO2 100 %.  Medical Problem List and Plan: 1.  Impaired mobility and ADLs secondary to right sided acute subcortical  CVA and right sided ankle fracture             -patient may shower              -ELOS/Goals: 10-14 days modI   Continue CIR 2.  Antithrombotics: -DVT/anticoagulation:  Pharmaceutical: Continue Xarelto             -antiplatelet therapy: N/A 3. Pain Management: Continue icing right knee. D/c'd oxycodone- not requiring Severe tricompartmental Right knee OA, voltaren gel  4. Mood: LCSW to follow for evaluation and support.              -antipsychotic agents: N/A 5. Neuropsych: This patient is capable of making decisions on his own behalf. 6. Skin/Wound Care: Routine pressure relief measures.  7. Fluids/Electrolytes/Nutrition: Monitor I/O. CMET reviewed and stable 8. Right ankle bimalleolar fx, lateral> medial Fx: WBAT with CAM boot. Wife apparently interested in finding out what ortho plan is for right ankle ( Dr. Stann Mainland).repeat xrays show no callus formation but good alignment -  Injury was on Jan 5th. On Vit D , add Ca++ 9. CAD s/p CABG: On Lipitor  10 A fib: Monitor HR tid--continue Xarelto. Resume metoprolol  11. HTN: Monitor BP tid--has been controlled without medications. Avoid hypoperfusion--out of 5 day window.              --Well controlled: continue metoprolol. Continue to hold Lasix, Diovan and Amlodipine--low dose due to SE)  Vitals:   08/21/20 1507 08/21/20 1903  BP: 103/90 122/78  Pulse: (!) 56 (!) 58  Resp: 17 17  Temp: 98.1 F (36.7 C) 97.9 F (36.6 C)  SpO2: 100% 100%  some lability , monitor prior to changes 12.   H/o gout: Managed on allopurinol.  13  diabetes- on metformin low dose          follow up with PCP, Hgb A1C in 3 mo. Discussed checking CBGs at home, advised low carb/no added sugar diet CBG (last 3)  No results for input(s): GLUCAP in the last 72 hours.controlled 2/17  14. Vitamin B 12 deficiency: Resume supplement.  15. Left knee OA: Ice TID. He has received neoprene knee sleeve.  Quadriceps strengthening exercises.  16. Obesity (BMI 30.51): He will benefit from dietary counseling.  17. Chronic Phimosis: patient to continue to reduce  nightly.              --Frequency managed with pads.  18. Disposition: plan is for d/c home with wife, who is a former Therapist, sports. He has home wheelchair, grab bar, chair lift is being installed  19.  Constipation add colace- had 2 BMs 2/17  LOS: 10 days A FACE TO FACE EVALUATION WAS PERFORMED  Martha Clan P Janashia Parco 08/21/2020, 7:17 PM

## 2020-08-21 NOTE — Progress Notes (Signed)
Occupational Therapy Session Note  Patient Details  Name: Shane Espinoza MRN: [id number] Date of Birth: 27-Jul-1940  Today's Date: 08/21/2020 OT Individual Time: 0813-8871 OT Individual Time Calculation (min): 59 min    Short Term Goals: Week 1:  OT Short Term Goal 1 (Week 1): Pt will complete toilet transfer with CGA + AE PRN OT Short Term Goal 1 - Progress (Week 1): Not met OT Short Term Goal 2 (Week 1): Pt will don pants with min A + AE PRN. OT Short Term Goal 2 - Progress (Week 1): Met OT Short Term Goal 3 (Week 1): Pt will stand to complete grooming task for at least 2 min with min A OT Short Term Goal 3 - Progress (Week 1): Met  Skilled Therapeutic Interventions/Progress Updates:    1;1. Pt and wife present. Pt in w/c ready to go declining bathing an ddressing as wife assisted with UB sponge bath. Pt completes functional transfers in ADL apartment to simulate home DC. Discussed options for walk in shower. Pt elects to practice BSC in shower with pt accessing shower using posterior method and RW and sitting directly back onto [facility] eliminating pivot to side. (educated wife at end of session about it). Pt completes bed and couch transfer with functional mobility in between with RW over carpet for different texture. All transfers CGA-S level with MIN A to power up from low couch. Returned pt to room for pt to transfer into recliner as stated above. Exited session with pt seated in recliner with exit alarm on and call light in reach   Therapy Documentation Precautions:  Precautions Precautions: Fall Precaution Comments: Rt [place] when OOB, RLE WBAT, R knee sleeve, L hemiparesis/lean Required Braces or Orthoses: Other Brace Other Brace: fall in January resulting in R ankle fx; Patient reports receiving "knee sleeve" to R knee to "help prevent knee buckling" Restrictions Weight Bearing Restrictions: No RLE Weight Bearing: Weight bearing as tolerated Other Position/Activity  Restrictions: with [place] General:   Vital Signs: Therapy Vitals Temp: 98 F (36.7 C) Pulse Rate: 93 Resp: 18 BP: (!) 141/84 Patient Position (if appropriate): Lying Oxygen Therapy SpO2: 97 % O2 Device: Room Air Pain:   ADL: ADL Eating: Set up Grooming: Moderate assistance (standing) Where Assessed-Grooming: Standing at sink Upper Body Bathing: Supervision/safety Where Assessed-Upper Body Bathing: Wheelchair Lower Body Bathing: Moderate assistance Where Assessed-Lower Body Bathing: Wheelchair Upper Body Dressing: Supervision/safety Where Assessed-Upper Body Dressing: Wheelchair Lower Body Dressing: Maximal assistance Where Assessed-Lower Body Dressing: Wheelchair Toileting: Minimal assistance Where Assessed-Toileting: Glass blower/designer: Moderate assistance Toilet Transfer Method: Stand pivot Toilet Transfer Equipment: Bedside commode,Grab bars Social research officer, government: Moderate assistance Social research officer, government Method: Radiographer, therapeutic: Engineer, maintenance (IT)    Praxis   Exercises:   Other Treatments:     Therapy/Group: Individual Therapy  Tonny Branch 08/21/2020, 6:47 AM

## 2020-08-22 NOTE — Progress Notes (Signed)
Occupational Therapy Session Note  Patient Details  Name: CORDERIUS SARACENI MRN: [id number] Date of Birth: 17-Apr-1941  Today's Date: 08/22/2020 OT Individual Time: 1300-1355 OT Individual Time Calculation (min): 55 min    Short Term Goals: Week 2:  OT Short Term Goal 1 (Week 2): Continue working on [employer] at supervision overall.  Skilled Therapeutic Interventions/Progress Updates:    Pt resting in w/c upon arrival. OT intervention with focus on functional amb with RW, toileting, standing balance, discharge planning, activity tolerance, and safety awareness to increase independence with BADLs. Pt requested to use bathroom. Pt amb with RW to bathroom (CGA) and sat on toilet. Toilet transfer with CGA. Toileting with CGA. Pt amb with RW to sink and stood to wash hands. Pt returned to w/c to discuss home setup and equipment. Pt will use BSC in shower. Pt amb with RW to ortho gym and stood at [place] for tasks: number sequencing to 30 with 3.32 reaction time. Pt completed alternating letter/number sequencing with 4.64 reaction time. No LOB noted. Pt requested between tasks. Pt amb with RW from gym to ADL apt door and turned around to return to room. Pt with increased pace with activity. Pt sat in recliner. Pt remained in recliner with all needs within reach.  Therapy Documentation Precautions:  Precautions Precautions: Fall Precaution Comments: Rt [place] when OOB, RLE WBAT, R knee sleeve, L hemiparesis/lean Required Braces or Orthoses: Other Brace Other Brace: fall in January resulting in R ankle fx; Patient reports receiving "knee sleeve" to R knee to "help prevent knee buckling" Restrictions Weight Bearing Restrictions: No RLE Weight Bearing: Weight bearing as tolerated Other Position/Activity Restrictions: with [place]  Pain: Pain Assessment Pain Scale: 0-10 Pain Score: 0-No pain   Therapy/Group: Individual Therapy  Leroy Libman 08/22/2020, 2:56 PM

## 2020-08-22 NOTE — Progress Notes (Signed)
[place] PHYSICAL MEDICINE & REHABILITATION PROGRESS NOTE   Subjective/Complaints: No complaints this morning.  He is very appreciative of care here.  He is working on bills that his wife brought him for intellectual stimulation BP slightly elevated  ROS: Patient denies CP, SOB,  N/V/D, pain  Objective:   No results found. No results for input(s): WBC, HGB, HCT, PLT in the last 72 hours. No results for input(s): NA, K, CL, CO2, GLUCOSE, BUN, CREATININE, CALCIUM in the last 72 hours.  Intake/Output Summary (Last 24 hours) at 08/22/2020 1000 Last data filed at 08/22/2020 0745 Gross per 24 hour  Intake 295 ml  Output 1075 ml  Net -780 ml        Physical Exam: Vital Signs Blood pressure (!) 147/93, pulse 74, temperature 98.5 F (36.9 C), resp. rate 18, height 5\' 9"  (1.753 m), weight 97.7 kg, SpO2 99 %.   Gen: no distress, normal appearing HEENT: oral mucosa pink and moist, NCAT Cardio: Reg rate Chest: normal effort, normal rate of breathing Abd: soft, non-distended Ext: no edema Psych: pleasant, normal affect Skin: intact  Motor 4/5 throughout left arm/leg, RUE/RLE 5/5 except Right ankle NT due to CAM walker  Musc: right knee tender with ROM  Assessment/Plan: 1. Functional deficits which require 3+ hours per day of interdisciplinary therapy in a comprehensive inpatient rehab setting.  Physiatrist is providing close team supervision and 24 hour management of active medical problems listed below.  Physiatrist and rehab team continue to assess barriers to discharge/monitor patient progress toward functional and medical goals  Care Tool:  Bathing    Body parts bathed by patient: Right arm,Left arm,Chest,Abdomen,Front perineal area,Buttocks,Right upper leg,Left upper leg,Face,Left lower leg,Right lower leg   Body parts bathed by helper: Left lower leg,Right lower leg,Buttocks     Bathing assist Assist Level: Minimal Assistance - Patient > 75%     Upper Body  Dressing/Undressing Upper body dressing   What is the patient wearing?: Pull over shirt    Upper body assist Assist Level: Supervision/Verbal cueing    Lower Body Dressing/Undressing Lower body dressing      What is the patient wearing?: Pants,Underwear/pull up     Lower body assist Assist for lower body dressing: Minimal Assistance - Patient > 75%     Toileting Toileting    Toileting assist Assist for toileting: Minimal Assistance - Patient > 75%     Transfers Chair/bed transfer  Transfers assist     Chair/bed transfer assist level: Contact Guard/Touching assist Chair/bed transfer assistive device: Walker,Armrests   Locomotion Ambulation   Ambulation assist      Assist level: Contact Guard/Touching assist Assistive device: Walker-rolling Max distance: 123ft   Walk 10 feet activity   Assist     Assist level: Contact Guard/Touching assist Assistive device: Walker-rolling   Walk 50 feet activity   Assist    Assist level: Contact Guard/Touching assist Assistive device: Walker-rolling    Walk 150 feet activity   Assist Walk 150 feet activity did not occur: Safety/medical concerns  Assist level: Minimal Assistance - Patient > 75% Assistive device: Walker-rolling    Walk 10 feet on uneven surface  activity   Assist Walk 10 feet on uneven surfaces activity did not occur: Safety/medical concerns         Wheelchair     Assist Will patient use wheelchair at discharge?: No             Wheelchair 50 feet with 2 turns activity  Assist            Wheelchair 150 feet activity     Assist          Blood pressure (!) 147/93, pulse 74, temperature 98.5 F (36.9 C), resp. rate 18, height 5\' 9"  (1.753 m), weight 97.7 kg, SpO2 99 %.  Medical Problem List and Plan: 1.  Impaired mobility and ADLs secondary to right sided acute subcortical  CVA and right sided ankle fracture             -patient may shower              -ELOS/Goals: 10-14 days modI   Continue CIR 2.  Antithrombotics: -DVT/anticoagulation:  Pharmaceutical: Conitnue Xarelto             -antiplatelet therapy: N/A 3. Pain Management: Continue icing right knee. D/c'd oxycodone- not requiring Severe tricompartmental Right knee OA, voltaren gel  4. Mood: LCSW to follow for evaluation and support.              -antipsychotic agents: N/A 5. Neuropsych: This patient is capable of making decisions on his own behalf. 6. Skin/Wound Care: Routine pressure relief measures.  7. Fluids/Electrolytes/Nutrition: Monitor I/O. CMET reviewed and stable 8. Right ankle bimalleolar fx, lateral> medial Fx: WBAT with CAM boot. Wife apparently interested in finding out what ortho plan is for right ankle ( Dr. Stann Mainland).repeat xrays show no callus formation but good alignment -  Injury was on Jan 5th. On Vit D , add Ca++ 9. CAD s/p CABG: On Lipitor  10 A fib: Monitor HR tid--continue Xarelto. Resume metoprolol  11. HTN: Monitor BP tid--has been controlled without medications. Avoid hypoperfusion--out of 5 day window.              --Well controlled: continue metoprolol. Continue to hold Lasix, Diovan and Amlodipine--low dose due to SE)  Vitals:   08/21/20 1903 08/22/20 0538  BP: 122/78 (!) 147/93  Pulse: (!) 58 74  Resp: 17 18  Temp: 97.9 F (36.6 C) 98.5 F (36.9 C)  SpO2: 100% 99%  labile- continue current regimen 12.   H/o gout: Managed on allopurinol.  13  diabetes- on metformin low dose          follow up with PCP, Hgb A1C in 3 mo. Discussed checking CBGs at home, advised low carb/no added sugar diet CBG (last 3)  No results for input(s): GLUCAP in the last 72 hours.controlled 2/17  14. Vitamin B 12 deficiency: Resume supplement.  15. Left knee OA: Ice TID. He has received neoprene knee sleeve.  Quadriceps strengthening exercises.  16. Obesity (BMI 30.51): He will benefit from dietary counseling.  17. Chronic Phimosis: patient to continue to reduce nightly.               --Frequency managed with pads.  18. Disposition: plan is for d/c home with wife, who is a former Therapist, sports. He has home wheelchair, grab bar, chair lift is being installed  19.  Constipation add colace- had 2 BMs 2/17  LOS: 11 days A FACE TO FACE EVALUATION WAS PERFORMED  Zanetta Dehaan P Raun Routh 08/22/2020, 10:00 AM

## 2020-08-22 NOTE — Progress Notes (Signed)
Physical Therapy Session Note  Patient Details  Name: Shane Espinoza MRN: [id number] Date of Birth: [birth year]  Today's Date: 08/22/2020 PT Individual Time: 0900-1000 PT Individual Time Calculation (min): 60 min   Short Term Goals: Week 2:  PT Short Term Goal 1 (Week 2): = to LTGs based on ELOS  Skilled Therapeutic Interventions/Progress Updates:    Pt received seated in w/c in room, agreeable to PT session. No complaints of pain. Sit to stand with min A to RW throughout session. Ambulation x 110 ft, x 150 ft with RW and CGA, decreased overall gait speed, decreased stance time and WBing on RLE, reliance on BUE on RW to un-weight RLE at times. Sit to stand 3 x 5 reps from elevated mat table with no AD and mod A with hands on thighs with focus on anterior weight shift and activation of quads and glutes to achieve standing. Pt also tends to have increased WBing in LLE as compared to RLE with sit to stand transfer, use of mirror and multi modal cueing to increase equal WBing through BLE. Attempted to perform mini-squats, pt unable to follow cues to perform exercise correctly and does not flex knees at the same time, exercise deferred. Pt left seated in w/c in room with needs in reach, wife present at end of session.   Therapy Documentation Precautions:  Precautions Precautions: Fall Precaution Comments: Rt [place] when OOB, RLE WBAT, R knee sleeve, L hemiparesis/lean Required Braces or Orthoses: Other Brace Other Brace: fall in January resulting in R ankle fx; Patient reports receiving "knee sleeve" to R knee to "help prevent knee buckling" Restrictions Weight Bearing Restrictions: No RLE Weight Bearing: Weight bearing as tolerated Other Position/Activity Restrictions: with [place]   Therapy/Group: Individual Therapy   Excell Seltzer, PT, DPT  08/22/2020, 12:19 PM

## 2020-08-23 LAB — CBC
HCT: 46 % (ref 39.0–52.0)
Hemoglobin: 14.9 g/dL (ref 13.0–17.0)
MCH: 30.2 pg (ref 26.0–34.0)
MCHC: 32.4 g/dL (ref 30.0–36.0)
MCV: 93.1 fL (ref 80.0–100.0)
Platelets: 202 10*3/uL (ref 150–400)
RBC: 4.94 MIL/uL (ref 4.22–5.81)
RDW: 13.4 % (ref 11.5–15.5)
WBC: 7.1 10*3/uL (ref 4.0–10.5)
nRBC: 0 % (ref 0.0–0.2)

## 2020-08-23 LAB — BASIC METABOLIC PANEL
Anion gap: 8 (ref 5–15)
BUN: 17 mg/dL (ref 8–23)
CO2: 28 mmol/L (ref 22–32)
Calcium: 9.5 mg/dL (ref 8.9–10.3)
Chloride: 104 mmol/L (ref 98–111)
Creatinine, Ser: 1.21 mg/dL (ref 0.61–1.24)
GFR, Estimated: >60 mL/min (ref >60)
Glucose, Bld: 113 mg/dL — ABNORMAL HIGH (ref 70–99)
Potassium: 4.2 mmol/L (ref 3.5–5.1)
Sodium: 140 mmol/L (ref 135–145)

## 2020-08-23 NOTE — Progress Notes (Signed)
[place] PHYSICAL MEDICINE & REHABILITATION PROGRESS NOTE   Subjective/Complaints:  Labs reviewed , discussed discharge BMs are regular   ROS: Patient denies CP, SOB,  N/V/D, pain  Objective:   No results found. Recent Labs    08/23/20 0556  WBC 7.1  HGB 14.9  HCT 46.0  PLT 202   Recent Labs    08/23/20 0556  NA 140  K 4.2  CL 104  CO2 28  GLUCOSE 113*  BUN 17  CREATININE 1.21  CALCIUM 9.5    Intake/Output Summary (Last 24 hours) at 08/23/2020 0803 Last data filed at 08/23/2020 0742 Gross per 24 hour  Intake 480 ml  Output 1550 ml  Net -1070 ml        Physical Exam: Vital Signs Blood pressure (!) 143/88, pulse 71, temperature 98 F (36.7 C), temperature source Oral, resp. rate 15, height 5\' 9"  (1.753 m), weight 97.7 kg, SpO2 100 %.   General: No acute distress Mood and affect are appropriate Heart: Regular rate and rhythm no rubs murmurs or extra sounds Lungs: Clear to auscultation, breathing unlabored, no rales or wheezes Abdomen: Positive bowel sounds, soft nontender to palpation, nondistended Extremities: No clubbing, cyanosis, or edema Skin: No evidence of breakdown, no evidence of rash  Motor 4/5 throughout left arm/leg, RUE/RLE 5/5 except Right ankle NT due to CAM walker  Musc: right knee tender with ROM  Assessment/Plan: 1. Functional deficits which require 3+ hours per day of interdisciplinary therapy in a comprehensive inpatient rehab setting.  Physiatrist is providing close team supervision and 24 hour management of active medical problems listed below.  Physiatrist and rehab team continue to assess barriers to discharge/monitor patient progress toward functional and medical goals  Care Tool:  Bathing    Body parts bathed by patient: Right arm,Left arm,Chest,Abdomen,Front perineal area,Buttocks,Right upper leg,Left upper leg,Face,Left lower leg,Right lower leg   Body parts bathed by helper: Left lower leg,Right lower leg,Buttocks      Bathing assist Assist Level: Minimal Assistance - Patient > 75%     Upper Body Dressing/Undressing Upper body dressing   What is the patient wearing?: Pull over shirt    Upper body assist Assist Level: Supervision/Verbal cueing    Lower Body Dressing/Undressing Lower body dressing      What is the patient wearing?: Pants,Underwear/pull up     Lower body assist Assist for lower body dressing: Minimal Assistance - Patient > 75%     Toileting Toileting    Toileting assist Assist for toileting: Contact Guard/Touching assist     Transfers Chair/bed transfer  Transfers assist     Chair/bed transfer assist level: Contact Guard/Touching assist Chair/bed transfer assistive device: Programmer, multimedia   Ambulation assist      Assist level: Contact Guard/Touching assist Assistive device: Walker-rolling Max distance: 150'   Walk 10 feet activity   Assist     Assist level: Contact Guard/Touching assist Assistive device: Walker-rolling   Walk 50 feet activity   Assist    Assist level: Contact Guard/Touching assist Assistive device: Walker-rolling    Walk 150 feet activity   Assist Walk 150 feet activity did not occur: Safety/medical concerns  Assist level: Contact Guard/Touching assist Assistive device: Walker-rolling    Walk 10 feet on uneven surface  activity   Assist Walk 10 feet on uneven surfaces activity did not occur: Safety/medical concerns         Wheelchair     Assist Will patient use wheelchair at discharge?: No  Wheelchair 50 feet with 2 turns activity    Assist            Wheelchair 150 feet activity     Assist          Blood pressure (!) 143/88, pulse 71, temperature 98 F (36.7 C), temperature source Oral, resp. rate 15, height 5\' 9"  (1.753 m), weight 97.7 kg, SpO2 100 %.  Medical Problem List and Plan: 1.  Impaired mobility and ADLs secondary to right sided acute  subcortical  CVA and right sided ankle fracture             -patient may shower             -ELOS/Goals: 2/24 modI   Continue CIR 2.  Antithrombotics: -DVT/anticoagulation:  Pharmaceutical: Conitnue Xarelto             -antiplatelet therapy: N/A 3. Pain Management: Continue icing right knee. D/c'd oxycodone- not requiring Severe tricompartmental Right knee OA, voltaren gel  4. Mood: LCSW to follow for evaluation and support.              -antipsychotic agents: N/A 5. Neuropsych: This patient is capable of making decisions on his own behalf. 6. Skin/Wound Care: Routine pressure relief measures.  7. Fluids/Electrolytes/Nutrition: Monitor I/O. CMET reviewed and stable 8. Right ankle bimalleolar fx, lateral> medial Fx: WBAT with CAM boot. Wife apparently interested in finding out what ortho plan is for right ankle ( Dr. Stann Mainland).repeat xrays show no callus formation but good alignment -  Injury was on Jan 5th. On Vit D , add Ca++ 9. CAD s/p CABG: On Lipitor  10 A fib: Monitor HR tid--continue Xarelto. Resume metoprolol  11. HTN: Monitor BP tid--has been controlled without medications. Avoid hypoperfusion--out of 5 day window.              --Well controlled: continue metoprolol. Continue to hold Lasix, Diovan and Amlodipine--low dose due to SE)  Vitals:   08/22/20 1953 08/23/20 0531  BP: 124/81 (!) 143/88  Pulse: (!) 56 71  Resp: 17 15  Temp: 98.2 F (36.8 C) 98 F (36.7 C)  SpO2: 100% 100%  labile- continue current regimen 12.   H/o gout: Managed on allopurinol.  13  diabetes- on metformin low dose          am CBG 113 in range   14. Vitamin B 12 deficiency: Resume supplement.  15. Left knee OA: Ice TID. He has received neoprene knee sleeve.  Quadriceps strengthening exercises.  16. Obesity (BMI 30.51): He will benefit from dietary counseling.  17. Chronic Phimosis: patient to continue to reduce nightly.              --Frequency managed with pads.  18. Disposition: plan is for d/c home  with wife, who is a former Therapist, sports. He has home wheelchair, grab bar, chair lift is being installed  19.  Constipation add colace- had 2 BMs 2/17  LOS: 12 days A FACE TO [place] E Veronica Fretz 08/23/2020, 8:03 AM

## 2020-08-23 NOTE — Plan of Care (Signed)
  Problem: RH Balance Goal: LTG Patient will maintain dynamic standing with ADLs (OT) Description: LTG:  Patient will maintain dynamic standing balance with assist during activities of daily living (OT)  Flowsheets (Taken 08/23/2020 1244) LTG: Pt will maintain dynamic standing balance during ADLs with: (goal downgraded based on progress) Contact Guard/Touching assist Note: goal downgraded based on progress   Problem: Sit to Stand Goal: LTG:  Patient will perform sit to stand in prep for activites of daily living with assistance level (OT) Description: LTG:  Patient will perform sit to stand in prep for activites of daily living with assistance level (OT) Flowsheets (Taken 08/23/2020 1244) LTG: PT will perform sit to stand in prep for activites of daily living with assistance level: (goal downgraded based on progress) Minimal Assistance - Patient > 75% Note: goal downgraded based on progress   Problem: RH Bathing Goal: LTG Patient will bathe all body parts with assist levels (OT) Description: LTG: Patient will bathe all body parts with assist levels (OT) Flowsheets (Taken 08/23/2020 1244) LTG: Pt will perform bathing with assistance level/cueing: (goal downgraded based on progress) Minimal Assistance - Patient > 75% LTG: Position pt will perform bathing:  Sit to Stand  Shower Note: goal downgraded based on progress   Problem: RH Dressing Goal: LTG Patient will perform lower body dressing w/assist (OT) Description: LTG: Patient will perform lower body dressing with assist, with/without cues in positioning using equipment (OT) Flowsheets (Taken 08/23/2020 1244) LTG: Pt will perform lower body dressing with assistance level of: (goal downgraded based on progress) Minimal Assistance - Patient > 75% Note: goal downgraded based on progress   Problem: RH Toileting Goal: LTG Patient will perform toileting task (3/3 steps) with assistance level (OT) Description: LTG: Patient will perform toileting  task (3/3 steps) with assistance level (OT)  Flowsheets (Taken 08/23/2020 1244) LTG: Pt will perform toileting task (3/3 steps) with assistance level: (goal downgraded based on progress) Minimal Assistance - Patient > 75% Note: goal downgraded based on progress   Problem: RH Toilet Transfers Goal: LTG Patient will perform toilet transfers w/assist (OT) Description: LTG: Patient will perform toilet transfers with assist, with/without cues using equipment (OT) Flowsheets (Taken 08/23/2020 1244) LTG: Pt will perform toilet transfers with assistance level of: (goal downgraded based on progress) Minimal Assistance - Patient > 75% Note: goal downgraded based on progress   Problem: RH Tub/Shower Transfers Goal: LTG Patient will perform tub/shower transfers w/assist (OT) Description: LTG: Patient will perform tub/shower transfers with assist, with/without cues using equipment (OT) Flowsheets (Taken 08/23/2020 1244) LTG: Pt will perform tub/shower stall transfers with assistance level of: (goal downgraded based on progress) Minimal Assistance - Patient > 75% LTG: Pt will perform tub/shower transfers from: Walk in shower Note: goal downgraded based on progress

## 2020-08-23 NOTE — Progress Notes (Signed)
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  Skilled Therapeutic Interventions/Progress Updates:    Session 1: [phone])  Pt completed shower and dressing with spouse in for education.  He was sitting on the toilet at start of session with transfer to the tub seat with min assist using the RW.  He was able to remove all clothing sit to stand with min assist from the tub seat including shoes and cam boot.  He was able to complete bathing with min assist only for standing to wash his buttocks.  LH sponge used for washing his lower legs and feet.  Next, he was able to complete donning cam boot and left shoe to ambulate out to the EOB for dressing with min assist for initial sit to stand and min guard for mobility with spouse assisting.  He was then able to complete all UB dressing with supervision and needed min assist for LB dressing to pull up underpants and pants over his hips.  He then completed practice transfer to the toilet with spouse assisting at min assist level.  Finished session sitting up in the wheelchair with the call button and phone in reach and safety belt in place.    Session 2: (1353-1500)  Pt in wheelchair to start session with spouse and their daughter present initially, but she was unable to stay secondary to an appointment.  Re-emphasized the need in general to be on the left side of the patient and use the gait belt during mobility and transfers.  They voiced understanding.  Took pt down to the ADL apartment for practice with walk-in shower transfers using simulated setup.  Discussed earlier with pt and spouse in previous session recommendations for grab bars in shower and then  completed transfer with this in mind.  Had pt step backwards over the edge of the shower using the RW for support.  He plans to initially sit on the 3:1 in the shower as the corner seat that is built in may not be large enough or high enough.  He was able to complete transfer in and out with min assist.  Next, had him complete bed transfer from the wheelchair with overall min guard assist with increased time.  Educated pt on technique to roll to the right side and then sit up with use of his UEs.  He was able to complete several with min guard to min assist.  Finished session with return to the room via wheelchair and education on LUE gross and FM strengthening exercises were completed.  He used the light resistance orange band for return demonstration of shoulder flexion, elbow flexion/extension, and shoulder horizontal abduction for 5-7 reps each.  Also educated pt on therapy putty exercises for gross grasp, tip to tip pinch, and lateral pinch.  Handouts provided with exercise demonstrations with pt being issued medium resistance red therapy putty as an upgrade from the green issued from acute care.  Pt left in the wheelchair with call button and phone in reach.  Pt's spouse able to now assist pt with transfers in the room as well as to the toilet.     Therapy Documentation Precautions:  Precautions Precautions: Fall Precaution Comments: Rt [place] when  OOB, RLE WBAT, R knee sleeve, L hemiparesis/lean Required Braces or Orthoses: Other Brace Other Brace: fall in January resulting in R ankle fx; Patient reports receiving "knee sleeve" to R knee to "help prevent knee buckling" Restrictions Weight Bearing Restrictions: No RLE Weight Bearing: Weight bearing as tolerated Other Position/Activity Restrictions: with [place]  Pain: Pain Assessment Pain Scale: Faces Pain Score: 0-No pain ADL: See Care Tool Section for some details of mobility and selfcare  Therapy/Group: Individual  Therapy  Lucero Auzenne OTR/L 08/23/2020, 12:39 PM

## 2020-08-23 NOTE — Progress Notes (Signed)
Physical Therapy Session Note  Patient Details  Name: Shane Espinoza MRN: [id number] Date of Birth: 06/05/1941  Today's Date: 08/23/2020 PT Individual Time: 1300-1345 PT Individual Time Calculation (min): 45 min   Short Term Goals: Week 2:  PT Short Term Goal 1 (Week 2): = to LTGs based on ELOS  Skilled Therapeutic Interventions/Progress Updates:   Patient received sitting up in wc, wife and dtr present for family ed. He is agreeable to PT and denies pain. Family with questions regarding grab bar placement in shower. PT emphasizing that grab bars should be horizontal and not vertical and placed near entrance to shower and strategically placed for where patient will hold onto for standing up/sitting down from 3-in-1 BSC. Patient and family verbalizing understanding. Dtr stating that patient has transporter chair that can also be used as a [id number]. PT unfamiliar with this piece of equipment, but advised patient and family to not use it as a walker and only transporter chair for community mobility, such as to drs appts and therapy- otherwise, use the RW. Patient and family verbalizing understanding. PT and patient demonstrating car transfer into SUV-height car with [name]. Verbal cues for safety and correct sequencing. Family declining to participate in hands on practice for this task at this time. Patient able to negotiate x4 steps with R HR use and CGA/MinA in step-to fashion. Educating family on correct positioning to guard patient and how to cue patient for safety. Wife able to demonstrate safety and competency with this task as well with patient negotiating additional 4 steps. Discussed having sturdy chair at the top of the steps if needed for energy conservation prior to ambulating into house. Patient returning to room in wc, family at bedside, call light within reach.    Therapy Documentation Precautions:  Precautions Precautions: Fall Precaution Comments: Rt [place] when OOB, RLE WBAT, R knee  sleeve, L hemiparesis/lean Required Braces or Orthoses: Other Brace Other Brace: fall in January resulting in R ankle fx; Patient reports receiving "knee sleeve" to R knee to "help prevent knee buckling" Restrictions Weight Bearing Restrictions: No RLE Weight Bearing: Weight bearing as tolerated Other Position/Activity Restrictions: with [place]    Therapy/Group: Individual Therapy  Karoline Caldwell, PT, DPT, CBIS  08/23/2020, 7:41 AM

## 2020-08-24 NOTE — Progress Notes (Signed)
Occupational Therapy Session Note  Patient Details  Name: Shane Espinoza MRN: [id number] Date of Birth: August 07, 1940  Today's Date: 08/24/2020 OT Individual Time: 1020-1101 OT Individual Time Calculation (min): 41 min    Short Term Goals: Week 2:  OT Short Term Goal 1 (Week 2): Continue working on [employer] at supervision overall.  Skilled Therapeutic Interventions/Progress Updates:   Session 1: (1020-1101)  Pt worked on sit to stand and standing balance with integration of the Wii.  LUE was used when manipulating the controller for the Wii.  Min guard assist was needed for sit to stand and standing balance with several intervals.  Had him holding the walker with the RUE on some and then also had him standing without any UE support for brief periods as well.  He would reach with the RUE to grab the walker when standing without support once he felt off balance.  No significant therapist intervention needed to regain his balance.  Finished session with return to the room and pt left sitting up with the call button and phone in reach and his spouse present.   Session 2: (9373-4287)  Pt worked on [employer] and endurance with use of the UE ergonometer.  He was able to complete 3 intervals of 5 mins each with resistance on level 3 and RPMs maintained around level 25.  Slight increased pain noted in the right biceps when peddling forward secondary to old injury, but no report of pain when peddling backwards.  He needed rest breaks of approximately two mins between each set.  Returned to the room at the end of the session via wheelchair where he was left with the call button and phone in reach and spouse present.    Therapy Documentation Precautions:  Precautions Precautions: Fall Precaution Comments: Rt Camboot when OOB, RLE WBAT, R knee sleeve, L hemiparesis/lean Required Braces or Orthoses: Other Brace Other Brace: fall in January resulting in R ankle fx; Patient reports  receiving "knee sleeve" to R knee to "help prevent knee buckling" Restrictions Weight Bearing Restrictions: Yes RLE Weight Bearing: Weight bearing as tolerated Other Position/Activity Restrictions: with [place]  Pain: Pain Assessment Pain Scale: Faces Pain Score: 0-No pain ADL: See Care Tool Section for some details of mobility and selfcare  Therapy/Group: Individual Therapy  MCGUIRE,JAMES OTR/L 08/24/2020, 12:57 PM

## 2020-08-24 NOTE — Progress Notes (Signed)
[facility] PHYSICAL MEDICINE & REHABILITATION PROGRESS NOTE   Subjective/Complaints:  No issues overnight pt getting ready for therapy   ROS: Patient denies CP, SOB,  N/V/D, pain  Objective:   No results found. Recent Labs    08/23/20 0556  WBC 7.1  HGB 14.9  HCT 46.0  PLT 202   Recent Labs    08/23/20 0556  NA 140  K 4.2  CL 104  CO2 28  GLUCOSE 113*  BUN 17  CREATININE 1.21  CALCIUM 9.5    Intake/Output Summary (Last 24 hours) at 08/24/2020 0754 Last data filed at 08/24/2020 0747 Gross per 24 hour  Intake 540 ml  Output 1200 ml  Net -660 ml        Physical Exam: Vital Signs Blood pressure (!) 155/70, pulse 70, temperature 97.9 F (36.6 C), resp. rate 18, height 5\' 9"  (1.753 m), weight 97.7 kg, SpO2 95 %.   General: No acute distress Mood and affect are appropriate Heart: Regular rate and rhythm no rubs murmurs or extra sounds Lungs: Clear to auscultation, breathing unlabored, no rales or wheezes Abdomen: Positive bowel sounds, soft nontender to palpation, nondistended Extremities: No clubbing, cyanosis, or edema Skin: No evidence of breakdown, no evidence of rash  Motor 4/5 throughout left arm/leg, RUE/RLE 5/5 except Right ankle NT due to CAM walker  Musc: right knee tender with ROM  Assessment/Plan: 1. Functional deficits which require 3+ hours per day of interdisciplinary therapy in a comprehensive inpatient rehab setting.  Physiatrist is providing close team supervision and 24 hour management of active medical problems listed below.  Physiatrist and rehab team continue to assess barriers to discharge/monitor patient progress toward functional and medical goals  Care Tool:  Bathing    Body parts bathed by patient: Right arm,Left arm,Chest,Abdomen,Front perineal area,Buttocks,Right upper leg,Left upper leg,Face,Left lower leg,Right lower leg   Body parts bathed by helper: Left lower leg,Right lower leg,Buttocks     Bathing assist Assist  Level: Minimal Assistance - Patient > 75%     Upper Body Dressing/Undressing Upper body dressing   What is the patient wearing?: Pull over shirt    Upper body assist Assist Level: Supervision/Verbal cueing    Lower Body Dressing/Undressing Lower body dressing      What is the patient wearing?: Pants,Underwear/pull up     Lower body assist Assist for lower body dressing: Minimal Assistance - Patient > 75%     Toileting Toileting    Toileting assist Assist for toileting: Minimal Assistance - Patient > 75% (sit to stand from lower toilet)     Transfers Chair/bed transfer  Transfers assist     Chair/bed transfer assist level: Contact Guard/Touching assist Chair/bed transfer assistive device: Programmer, multimedia   Ambulation assist      Assist level: Contact Guard/Touching assist Assistive device: Walker-rolling Max distance: 10'   Walk 10 feet activity   Assist     Assist level: Contact Guard/Touching assist Assistive device: Walker-rolling   Walk 50 feet activity   Assist    Assist level: Contact Guard/Touching assist Assistive device: Walker-rolling    Walk 150 feet activity   Assist Walk 150 feet activity did not occur: Safety/medical concerns  Assist level: Contact Guard/Touching assist Assistive device: Walker-rolling    Walk 10 feet on uneven surface  activity   Assist Walk 10 feet on uneven surfaces activity did not occur: Safety/medical concerns         Wheelchair     Assist Will  patient use wheelchair at discharge?: No             Wheelchair 50 feet with 2 turns activity    Assist            Wheelchair 150 feet activity     Assist          Blood pressure (!) 155/70, pulse 70, temperature 97.9 F (36.6 C), resp. rate 18, height 5\' 9"  (1.753 m), weight 97.7 kg, SpO2 95 %.  Medical Problem List and Plan: 1.  Impaired mobility and ADLs secondary to right sided acute subcortical  CVA  and right sided ankle fracture             -patient may shower             -ELOS/Goals: 2/24 modI   Continue CIR PT, OT- team conf in am  2.  Antithrombotics: -DVT/anticoagulation:  Pharmaceutical: Conitnue Xarelto             -antiplatelet therapy: N/A 3. Pain Management: Continue icing right knee. D/c'd oxycodone- not requiring Severe tricompartmental Right knee OA, voltaren gel  4. Mood: LCSW to follow for evaluation and support.              -antipsychotic agents: N/A 5. Neuropsych: This patient is capable of making decisions on his own behalf. 6. Skin/Wound Care: Routine pressure relief measures.  7. Fluids/Electrolytes/Nutrition: Monitor I/O. CMET reviewed and stable 8. Right ankle bimalleolar fx, lateral> medial Fx: WBAT with CAM boot. Wife apparently interested in finding out what ortho plan is for right ankle ( Dr. Stann Mainland).repeat xrays show no callus formation but good alignment -  Injury was on Jan 5th. On Vit D , add Ca++ 9. CAD s/p CABG: On Lipitor  10 A fib: Monitor HR tid--continue Xarelto. Resume metoprolol  11. HTN: Monitor BP tid--has been controlled without medications. Avoid hypoperfusion--out of 5 day window.              --Well controlled: continue metoprolol. Continue to hold Lasix, Diovan and Amlodipine--low dose due to SE)  Vitals:   08/23/20 1927 08/24/20 0530  BP: 134/68 (!) 155/70  Pulse: 62 70  Resp: 16 18  Temp: 98.1 F (36.7 C) 97.9 F (36.6 C)  SpO2: 100% 95%  some lability with systolic BP  12.   H/o gout: Managed on allopurinol.  13  diabetes- on metformin low dose         14. Vitamin B 12 deficiency: Resume supplement.  15. Left knee OA: Ice TID. He has received neoprene knee sleeve.  Quadriceps strengthening exercises.  16. Obesity (BMI 30.51): He will benefit from dietary counseling.  17. Chronic Phimosis: patient to continue to reduce nightly.              --Frequency managed with pads.  18. Disposition: plan is for d/c home with wife, who is  a former Therapist, sports. He has home wheelchair, grab bar, chair lift is being installed  19.  Constipation add colace- had 2 BMs 2/17  LOS: 13 days A FACE TO [place] E Shikita Vaillancourt 08/24/2020, 7:54 AM

## 2020-08-24 NOTE — Progress Notes (Signed)
Physical Therapy Session Note  Patient Details  Name: Shane Espinoza MRN: [id number] Date of Birth: [birth year]  Today's Date: 08/24/2020 PT Individual Time: [phone] and [phone] PT Individual Time Calculation (min): 76 min and 68 min   Short Term Goals: Week 2:  PT Short Term Goal 1 (Week 2): = to LTGs based on ELOS  Skilled Therapeutic Interventions/Progress Updates:    Session 1: Pt received sitting in w/c eager and ready for therapy session with his wife present. Wearing R LE CAM boot. Pt's wife reports education/training went well yesterday and she currently has no questions/concerns but may join PM session for additional practice if needed.  Transported pt to/from gym in w/c for time management and energy conservation. Stand pivot w/c<>Nustep using RW with CGA for steadying - upon coming to stand had slight L posterior lean but able to recover with cuing for hip extension and weight shifting but no increased assist. Pt reports increased "soreness" "sensitivity" along R antero-lateral knee joint - upon palpation noted some fluid along the R lateral knee joint line and superior - MD notified. Performed B LE reciprocal movement pattern on Nustep against level 2 resistance for 3 minutes attempted to increase to level 4 resistance; however, pt reporting increased soreness therefore returned to level 2 to complete 5 minutes. Stand pivot back to w/c using RW with CGA. Gait training 132ft using RW with CGA - continues to demo slow gait speed with varying reciprocal to step-to pattern leading with RLE and using increased B UE support on RW during R LE stance phase - cuing for improved upright posture and increased R LE hip muscle activation during stance.  Supine<>sit on mat table supervision.  Performed the following B LE exercises targeting HEP progression as appropriate: - bridge x15 reps with pt demonstrating and reporting ease of movement progressed to partial singe leg bridging with 1 LE  partly extended -  supine hip/knee flexion from LE extended position x20reps targeting hip flexor strength - supine hip abduction/adduction active assist to maintain proper alignment as pt tends to compensate with knee flexion x20reps - L sidelying R LE clamshells x20 reps with cuing for proper form/technique Short distance ~55ft ambulatory transfer to w/c using RW with CGA for safety progressing towards supervision. Transported back to room and therapist providing ice for R knee and ankle. Pt left sitting in w/c with needs in reach and wife present.  Session 2: Pt received sitting in w/c eager and ready to participate in therapy session with his wife present and planning to participate for increased hands-on training/education.  Transported to/from gym in w/c for time management and energy conservation. Pt/wife confirmed they have RW and a transport chair for community mobility as needed. Pt's wife aware of how to safely use gait belt and demonstrated this throughout session without cuing. Pt/wife performed stand pivot car transfer (SUV height) to/from w/c with therapist providing mod cuing for proper set-up to use RW then pt/wife performed 2nd time without cuing demonstrating good understanding with proper technique. Pt's wife able to demonstrate proper positioning to assist pt during ambulation ~170ft to main therapy gym without cuing from therapist - pt using RW and demonstrating slow gait speed with decreased B LE step lengths and foot clearance with short reciprocal stepping pattern. Therapist educated pt/family on fall risk safety in the home regarding proper footware, rugs, pets, etc. Therapist educated on signs/symptoms of a stroke with pt's wife able to verbalize them without cuing/education due to her nursing hx. Therapist  educated pt/wife on proper set-up of RW and wheelchair (or other chair) at top/bottom of steps to allow pt to sit down at top/bottom in event of fatigue and to allow pt's wife time  to manage DME. Pt ascended/descended 4 steps 2x uisng R HR and side-stepping pattern with pt's wife providing proper CGA with min progressed to no cuing - pt demonstrates significant improvement in ability to power down through L LE and step-up during ascent as well as improved eccentric control during descent. Pt/wife report no questions/concerns regarding stair navigation. Therapist educated pt/wife on proper technique to navigate 1 step up/down in/out of home using RW with pt performing with CGA from his wife x2 and only min progressed to no cuing from therapist. Transported back to room in w/c. Pt/wife report no questions/concerns and report feeling confident/comfortable with discharge plan. Pt left seated in w/c in the care of his wife.   Therapy Documentation Precautions:  Precautions Precautions: Fall Precaution Comments: Rt Camboot when OOB, RLE WBAT, R knee sleeve, L hemiparesis/lean Required Braces or Orthoses: Other Brace Other Brace: fall in January resulting in R ankle fx; Patient reports receiving "knee sleeve" to R knee to "help prevent knee buckling" Restrictions Weight Bearing Restrictions: Yes RLE Weight Bearing: Weight bearing as tolerated Other Position/Activity Restrictions: with [place]  Pain: Session 1: Reports increased R anterolateral knee "soreness" "sensitivity" - modified interventions for pain management - provided ice at end of session.  Session 2: No complaints of pain during session.   Therapy/Group: Individual Therapy  Tawana Scale , PT, DPT, CSRS  08/24/2020, 7:54 AM

## 2020-08-24 NOTE — Progress Notes (Signed)
Occupational Therapy Discharge Summary  Patient Details  Name: Shane Espinoza MRN: [id number] Date of Birth: 1940/10/31   Patient has met 12 of 12 long term goals due to improved activity tolerance, improved balance, ability to compensate for deficits, functional use of  LEFT upper and LEFT lower extremity and improved coordination.  Patient to discharge at [facility] Assist level.  Patient's care partner is independent to provide the necessary physical assistance at discharge.    Reasons goals not met: NA  Recommendation:  Patient will benefit from ongoing skilled OT services in outpatient setting to continue to advance functional skills in the area of BADL, iADL and Reduce care partner burden.  Pt will continue to benefit from min to min guard assist from his spouse for transfers and LB selfcare until balance improves and he can hopefully be discharged from having to wear the cam boot.  Feel he will benefit from outpatient OT eval for safety and continued strengthening in order to reach modified independent level for ADL tasks.    Equipment: No equipment provided  Reasons for discharge: treatment goals met and discharge from hospital  Patient/family agrees with progress made and goals achieved: Yes  OT Discharge Precautions/Restrictions  Precautions Precautions: Fall Precaution Comments: Rt [place] when OOB, RLE WBAT, R knee sleeve, L hemiparesis/lean Required Braces or Orthoses: Other Brace Other Brace: fall in January resulting in R ankle fx; Patient reports receiving "knee sleeve" to R knee to "help prevent knee buckling" Restrictions Weight Bearing Restrictions: No RLE Weight Bearing: Weight bearing as tolerated  ADL ADL Eating: Independent Where Assessed-Eating: Wheelchair Grooming: Modified independent Where Assessed-Grooming: Wheelchair Upper Body Bathing: Supervision/safety Where Assessed-Upper Body Bathing: Shower Lower Body Bathing: Minimal assistance Where  Assessed-Lower Body Bathing: Chair,Shower Upper Body Dressing: Modified independent (Device) Where Assessed-Upper Body Dressing: Wheelchair Lower Body Dressing: Minimal assistance Where Assessed-Lower Body Dressing: Wheelchair,Edge of bed Toileting: Minimal assistance Where Assessed-Toileting: Glass blower/designer: Psychiatric nurse Method: Counselling psychologist: Grab bars,Raised Adult nurse: Not assessed Social research officer, government: Environmental education officer Method: Heritage manager: Civil engineer, contracting with back Vision Baseline Vision/History: Wears glasses Wears Glasses: Reading only Patient Visual Report: No change from baseline Vision Assessment?: Yes Eye Alignment: Within Functional Limits Ocular Range of Motion: Within Functional Limits Alignment/Gaze Preference: Within Defined Limits Tracking/Visual Pursuits: Decreased smoothness of horizontal tracking;Decreased smoothness of vertical tracking Convergence: Within functional limits Visual Fields: No apparent deficits Perception  Perception: Within Functional Limits Praxis Praxis: Intact Cognition Overall Cognitive Status: Within Functional Limits for tasks assessed Arousal/Alertness: Awake/alert Attention: Sustained;Selective Sustained Attention: Appears intact Selective Attention: Appears intact Memory: Appears intact Awareness: Appears intact Problem Solving: Appears intact Safety/Judgment: Appears intact Sensation Sensation Light Touch: Appears Intact Hot/Cold: Appears Intact Proprioception: Appears Intact Stereognosis: Appears Intact Coordination Gross Motor Movements are Fluid and Coordinated: Yes Fine Motor Movements are Fluid and Coordinated: Yes (BUE coordination is intact with UE use.) Motor  Motor Motor - Discharge Observations: Generalized weakness with slight weakness in the LUE when compared to the right but does not  affect functional use. Mobility  Bed Mobility Bed Mobility: Rolling Right;Right Sidelying to Sit;Sit to Supine Rolling Right: Supervision/verbal cueing Right Sidelying to Sit: Supervision/Verbal cueing Supine to Sit: Supervision/Verbal cueing Sit to Supine: Supervision/Verbal cueing Transfers Sit to Stand: Contact Guard/Touching assist Stand to Sit: Supervision/Verbal cueing  Trunk/Postural Assessment  Cervical Assessment Cervical Assessment: Exceptions to [facility] (cervical protraction) Thoracic Assessment Thoracic Assessment: Exceptions to [facility] (thoracic rounding) Lumbar Assessment  Lumbar Assessment: Exceptions to [facility] (posterior pelvic tilt)  Balance Balance Balance Assessed: Yes Static Sitting Balance Static Sitting - Balance Support: Feet supported Static Sitting - Level of Assistance: 7: Independent Dynamic Sitting Balance Dynamic Sitting - Balance Support: Feet supported;During functional activity Dynamic Sitting - Level of Assistance: 7: Independent Static Standing Balance Static Standing - Balance Support: Bilateral upper extremity supported;During functional activity Static Standing - Level of Assistance: 5: Stand by assistance Dynamic Standing Balance Dynamic Standing - Balance Support: During functional activity Dynamic Standing - Level of Assistance: 4: Min assist Extremity/Trunk Assessment RUE Assessment RUE Assessment: Within Functional Limits Active Range of Motion (AROM) Comments: AROM WFLs General Strength Comments: Strength 5/5 throughout LUE Assessment LUE Assessment: Exceptions to [facility] Active Range of Motion (AROM) Comments: AROM WFLS for all joints General Strength Comments: strength 4+/5 throughout    MCGUIRE,JAMES OTR/L 08/24/2020, 5:58 PM

## 2020-08-25 NOTE — Progress Notes (Signed)
Patient ID: Shane Espinoza, male   DOB: 03/22/1941, 80 y.o.   MRN: [id number] Team Conference Report to Patient/Family  Team Conference discussion was reviewed with the patient and caregiver, including goals, any changes in plan of care and target discharge date.  Patient and caregiver express understanding and are in agreement.  The patient has a target discharge date of 08/26/20.  [name] 08/25/2020, 1:42 PM

## 2020-08-25 NOTE — Progress Notes (Signed)
Occupational Therapy Session Note  Patient Details  Name: Shane Espinoza MRN: [id number] Date of Birth: 1940-10-23  Today's Date: 08/25/2020 OT Individual Time: 5597-4163 OT Individual Time Calculation (min): 55 min    Short Term Goals: Week 1:  OT Short Term Goal 1 (Week 1): Pt will complete toilet transfer with CGA + AE PRN OT Short Term Goal 1 - Progress (Week 1): Not met OT Short Term Goal 2 (Week 1): Pt will don pants with min A + AE PRN. OT Short Term Goal 2 - Progress (Week 1): Met OT Short Term Goal 3 (Week 1): Pt will stand to complete grooming task for at least 2 min with min A OT Short Term Goal 3 - Progress (Week 1): Met  Skilled Therapeutic Interventions/Progress Updates:    Session Note: Pt received in w/c with wife present. Session focus on DC reassessments with session focus on showering and dressing with wife present for fam edu. Pt completed STS with RW with close S and amb to shower to complete walk-in shower transfer via posterior method with CGA. Bathed UB with close S, LB with close S to bathe buttocks with use of grab bar to squat forward. STS and transferred to [facility] in bathroom to get dressed with close S to [place]. Doffed/donned shirt with mod I, completed LB dressing with min A for balance in standing/to strap in R CAM boot. Amb back to w/c same manner as before. Pt left in in w/c with wife present, call bell in reach, and all immediate needs met.   Therapy Documentation Precautions:  Precautions Precautions: Fall Precaution Comments: Rt [place] when OOB, RLE WBAT, R knee sleeve, L hemiparesis/lean Required Braces or Orthoses: Other Brace Other Brace: fall in January resulting in R ankle fx; Patient reports receiving "knee sleeve" to R knee to "help prevent knee buckling" Restrictions Weight Bearing Restrictions: Yes RLE Weight Bearing: Weight bearing as tolerated Other Position/Activity Restrictions: with [place]  Pain: Pain Assessment Pain Scale:  0-10 Pain Score: 0-No pain ADL: See Care Tool for more details.   Therapy/Group: Individual Therapy  Volanda Napoleon MS, OTR/L  08/25/2020, 10:28 AM

## 2020-08-25 NOTE — Plan of Care (Signed)

## 2020-08-25 NOTE — Patient Care Conference (Signed)
Inpatient RehabilitationTeam Conference and Plan of Care Update Date: 08/25/2020   Time: 10:48 AM    Patient Name: Shane Espinoza [facility]      Medical Record Number: [id number]  Date of Birth: 17-May-1941 Sex: Male         Room/Bed: 4M12C/4M12C-01 Payor Info: Payor: [employer] MEDICARE / Plan: [employer] MEDICARE HMO/PPO / Product Type: *No Product type* /    Admit Date/Time:  08/11/2020  3:40 PM  Primary Diagnosis:  Stroke (cerebrum) [facility])  Hospital Problems: Principal Problem:   Stroke (cerebrum) [facility])    Expected Discharge Date: Expected Discharge Date: 08/26/20  Team Members Present: Physician leading conference: Dr. Alysia Penna Care Coodinator Present: Dorien Chihuahua, RN, BSN, CRRN;Christina Sampson Goon, [place] Nurse Present: Pamella Pert) [place], LPN PT Present: Estevan Ryder, PT OT Present: Leretha Pol, OT PPS Coordinator present : Gunnar Fusi, SLP     Current Status/Progress Goal Weekly Team Focus  Bowel/Bladder             Swallow/Nutrition/ Hydration             ADL's   Supervision for UB selfcare, min guard to min assist for LB selfcare, transfers.  Improved balance with min guard for mobility to the bathroom with use of the RW.  supervision to min assist  selfcare retraining, balance retraining, neuromuscular re-education, transfer retraining, DME education, therapeutic exercise, family education   Mobility   CGA bed mobility, CGA sit<>stand and stand pivot transfers using RW, CGA gait up to 129ft using RW, CGA 4 steps using R handrail and side-stepping technique  supervision/CGA overall at ambulatory level  activity tolerance, B LE strengthening, transfer training static and dynamic standing balance, gait training, stair navigation training, pt/family education   Communication             Safety/Cognition/ Behavioral Observations            Pain             Skin               Discharge Planning:  Patient set for d/c 2/24   Team Discussion: OA knee pain  with small effusion noted. DM is controlled. Patient will not need DME; on target for discharge. Patient on target to meet rehab goals: yes,  Currently supervision for upper body care and min assist for lower body care and CGA- min guard for transfers with min assist goals set for discharge. . *See Care Plan and progress notes for long and short-term goals.   Revisions to Treatment Plan:   Teaching Needs: Transfers, toileting, WB precautions, secondary stroke risk management, medications, etc  Current Barriers to Discharge: Decreased caregiver support, Home enviroment access/layout and Weight bearing restrictions  Possible Resolutions to Barriers: Family education completed     Medical Summary Current Status: HTN controlled bowels doing better, pain controlled with tylenol, some knee pain  Barriers to Discharge: Medical stability   Possible Resolutions to [employer] Focus: set up home health and equipment via [facility]   Continued Need for Acute Rehabilitation Level of Care: The patient requires daily medical management by a physician with specialized training in physical medicine and rehabilitation for the following reasons: Direction of a multidisciplinary physical rehabilitation program to maximize functional independence : Yes Medical management of patient stability for increased activity during participation in an intensive rehabilitation regime.: Yes Analysis of laboratory values and/or radiology reports with any subsequent need for medication adjustment and/or medical intervention. : Yes   I attest that I was  present, lead the team conference, and concur with the assessment and plan of the team.   Margarito Liner 08/25/2020, 3:16 PM

## 2020-08-25 NOTE — Progress Notes (Signed)
Physical Therapy Discharge Summary  Patient Details  Name: Shane Espinoza MRN: [id number] Date of Birth: 26-Jul-1940  Today's Date: 08/25/2020 PT Individual Time: 0800-0855 PT Individual Time Calculation (min): 55 min   Session 1: Patient received sitting up in wc, agreeable to PT, wife at bedside. He denies pain, but reports that R knee was bothering him overnight possibly related to exercises completed yesterday. RN provided patient with pain rx this AM. Reviewed with patient and wife again process for transitioning to OP PT (that they will complete their own eval and create their own POC). Patient and wife verbalized understanding. He completed d/c assessment as noted below. He scored a 26/56 on the Berg Balance Scale indicating increased risk for falling, though this score has improved from a 4/56. Patient verbalized understanding. Patient completed TUG in 1'28" with RW and supervision, indicating increased risk for falling as well. Patient verbalizing understanding. He returned to his room in wc, call light within reach, wife at bedside.    Session 2: Patient received sitting up in wc, agreeable to PT. He denies pain. PT propelling patient in wc to therapy gym for time management and energy conservation. He was able to negotiate 4 steps with 1 HR and CGA. Step to approach. Patient completing the following therex: R ankle ROM on air cushion, ankle alphabet, ankle 4-ways, HSC, hip abduction. Patient requesting to be weighed. He was able to step up 2" curb onto scale with RW and CGA. Kinetron completed in sitting 20cm/s 45sx4. Patient returning to room in wc, call light within reach.    Patient has met 9 of 9 long term goals due to improved activity tolerance, improved balance, improved postural control, increased strength, increased range of motion, decreased pain, ability to compensate for deficits, functional use of  left upper extremity and left lower extremity and improved coordination.   Patient to discharge at an ambulatory level Supervision.   Patient's care partner is independent to provide the necessary physical assistance at discharge.   Recommendation:  Patient will benefit from ongoing skilled PT services in outpatient setting to continue to advance safe functional mobility, address ongoing impairments in balance, ROM, gait progressions, stair negotiation, functional LE strength, and minimize fall risk.  Equipment: No equipment provided  Reasons for discharge: treatment goals met and discharge from hospital  Patient/family agrees with progress made and goals achieved: Yes  PT Discharge Precautions/Restrictions Precautions Precautions: Fall Precaution Comments: Rt [place] when OOB, RLE WBAT, R knee sleeve, L hemiparesis/lean Other Brace: fall in January resulting in R ankle fx; Patient reports receiving "knee sleeve" to R knee to "help prevent knee buckling" Restrictions Weight Bearing Restrictions: Yes RLE Weight Bearing: Weight bearing as tolerated Other Position/Activity Restrictions: with [place] Vital Signs Therapy Vitals Temp: 98 F (36.7 C) Pulse Rate: 73 Resp: 18 BP: (!) 139/93 Patient Position (if appropriate): Lying Oxygen Therapy SpO2: 97 % O2 Device: Room Air Pain Pain Assessment Pain Scale: 0-10 Pain Score: 0-No pain Pain Type: Acute pain Pain Location: Ankle Pain Orientation: Right Pain Intervention(s): Medication (See eMAR);Repositioned Multiple Pain Sites: No Vision/Perception  Vision - Assessment Eye Alignment: Within Functional Limits Ocular Range of Motion: Within Functional Limits Alignment/Gaze Preference: Within Defined Limits Tracking/Visual Pursuits: Decreased smoothness of horizontal tracking;Decreased smoothness of vertical tracking Convergence: Within functional limits Perception Perception: Within Functional Limits Praxis Praxis: Intact  Cognition Overall Cognitive Status: Within Functional Limits for tasks  assessed Arousal/Alertness: Awake/alert Orientation Level: Oriented X4 Sustained Attention: Appears intact Selective Attention: Appears intact Memory: Appears  intact Awareness: Appears intact Problem Solving: Appears intact Safety/Judgment: Appears intact Sensation Sensation Light Touch: Appears Intact Hot/Cold: Appears Intact Proprioception: Appears Intact Stereognosis: Appears Intact Coordination Gross Motor Movements are Fluid and Coordinated: Yes Fine Motor Movements are Fluid and Coordinated: Yes Heel Shin Test: Decreased coordination L LE Motor  Motor Motor: Hemiplegia;Abnormal tone;Abnormal postural alignment and control Motor - Skilled Clinical Observations: slight hemiparesis L Motor - Discharge Observations: Generalized weakness with slight weakness in the LUE/LLE when compared to the right but does not affect functional use.  Mobility Bed Mobility Bed Mobility: Rolling Right;Right Sidelying to Sit;Sit to Supine Rolling Right: Supervision/verbal cueing Rolling Left: Supervision/Verbal cueing Supine to Sit: Supervision/Verbal cueing Sit to Supine: Supervision/Verbal cueing Transfers Transfers: Sit to Stand;Stand to Sit;Stand Pivot Transfers Sit to Stand: Supervision/Verbal cueing Stand to Sit: Supervision/Verbal cueing Stand Pivot Transfers: Supervision/Verbal cueing Stand Pivot Transfer Details: Verbal cues for safe use of DME/AE Transfer (Assistive device): Rolling walker Locomotion  Gait Ambulation: Yes Gait Assistance: Supervision/Verbal cueing Gait Distance (Feet): 150 Feet Assistive device: Rolling walker Gait Assistance Details: Verbal cues for safe use of DME/AE;Verbal cues for precautions/safety;Verbal cues for technique;Verbal cues for gait pattern Gait Gait: Yes Gait Pattern: Impaired Gait Pattern: Step-to pattern;Decreased stance time - right;Trunk flexed;Narrow base of support;Poor foot clearance - right;Poor foot clearance - left;Decreased step  length - left Gait velocity: decreased Stairs / Additional Locomotion Stairs: Yes Stairs Assistance: Contact Guard/Touching assist Stair Management Technique: One rail Right Number of Stairs: 4 Height of Stairs: 6 Ramp: Contact Guard/touching assist Curb: Contact Guard/Touching assist Wheelchair Mobility Wheelchair Mobility: No  Trunk/Postural Assessment  Cervical Assessment Cervical Assessment: Exceptions to [facility] (forward head) Thoracic Assessment Thoracic Assessment: Exceptions to [facility] (increased kyphosis) Lumbar Assessment Lumbar Assessment: Exceptions to [facility] (posterior pelvic tilt) Postural Control Postural Control: Deficits on evaluation Righting Reactions: delayed and inadequate Protective Responses: delayed and inadequate  Balance Balance Balance Assessed: Yes Standardized Balance Assessment Standardized Balance Assessment: Berg Balance Test;Timed Up and Go Test Berg Balance Test Sit to Stand: Able to stand  independently using hands Standing Unsupported: Able to stand 2 minutes with supervision Sitting with Back Unsupported but Feet Supported on Floor or Stool: Able to sit safely and securely 2 minutes Stand to Sit: Controls descent by using hands Transfers: Able to transfer with verbal cueing and /or supervision Standing Unsupported with Eyes Closed: Able to stand 10 seconds with supervision Standing Ubsupported with Feet Together: Able to place feet together independently but unable to hold for 30 seconds From Standing, Reach Forward with Outstretched Arm: Reaches forward but needs supervision From Standing Position, Pick up Object from Floor: Unable to try/needs assist to keep balance From Standing Position, Turn to Look Behind Over each Shoulder: Turn sideways only but maintains balance Turn 360 Degrees: Needs close supervision or verbal cueing Standing Unsupported, Alternately Place Feet on Step/Stool: Needs assistance to keep from falling or unable to try Standing  Unsupported, One Foot in Front: Needs help to step but can hold 15 seconds Standing on One Leg: Tries to lift leg/unable to hold 3 seconds but remains standing independently Total Score: 26 Timed Up and Go Test TUG: Normal TUG Normal TUG (seconds): 88.14 Static Sitting Balance Static Sitting - Balance Support: Feet supported Static Sitting - Level of Assistance: 7: Independent Dynamic Sitting Balance Dynamic Sitting - Balance Support: Feet supported;During functional activity Dynamic Sitting - Level of Assistance: 7: Independent Static Standing Balance Static Standing - Balance Support: Bilateral upper extremity supported;During functional activity Static Standing -  Level of Assistance: 5: Stand by assistance Dynamic Standing Balance Dynamic Standing - Balance Support: During functional activity Dynamic Standing - Level of Assistance: 5: Stand by assistance Extremity Assessment      RLE Assessment RLE Assessment: Exceptions to [facility] Passive Range of Motion (PROM) Comments: limited in all 4 quadrants, but AROM noted, R ankle Active Range of Motion (AROM) Comments: limited in all 4 quadrants, but AROM noted, R ankle RLE Strength Right Hip Flexion: 3+/5 Right Hip ABduction: 4-/5 Right Hip ADduction: 4-/5 Right Knee Flexion: 4-/5 Right Knee Extension: 3+/5 Right Ankle Dorsiflexion:  (deferred d/t ankle fx) Right Ankle Plantar Flexion:  (deferred d/t ankle fx) LLE Assessment LLE Assessment: Exceptions to [facility] LLE Strength Left Hip Flexion: 4-/5 Left Hip ABduction: 4-/5 Left Hip ADduction: 4-/5 Left Knee Flexion: 4-/5 Left Knee Extension: 4-/5 Left Ankle Dorsiflexion: 4-/5 Left Ankle Plantar Flexion: 4-/5    Debbora Dus 08/25/2020, 8:49 AM

## 2020-08-25 NOTE — Progress Notes (Signed)
[place] PHYSICAL MEDICINE & REHABILITATION PROGRESS NOTE   Subjective/Complaints: Looking forward to d/c in am , wife asking about d/c meds  ROS: Patient denies CP, SOB,  N/V/D, pain  Objective:   No results found. Recent Labs    08/23/20 0556  WBC 7.1  HGB 14.9  HCT 46.0  PLT 202   Recent Labs    08/23/20 0556  NA 140  K 4.2  CL 104  CO2 28  GLUCOSE 113*  BUN 17  CREATININE 1.21  CALCIUM 9.5    Intake/Output Summary (Last 24 hours) at 08/25/2020 0758 Last data filed at 08/25/2020 0739 Gross per 24 hour  Intake 696 ml  Output 1200 ml  Net -504 ml        Physical Exam: Vital Signs Blood pressure (!) 139/93, pulse 73, temperature 98 F (36.7 C), resp. rate 18, height 5' 9"  (1.753 m), weight 97.7 kg, SpO2 97 %.   General: No acute distress Mood and affect are appropriate Heart: Regular rate and rhythm no rubs murmurs or extra sounds Lungs: Clear to auscultation, breathing unlabored, no rales or wheezes Abdomen: Positive bowel sounds, soft nontender to palpation, nondistended Extremities: No clubbing, cyanosis, or edema Skin: No evidence of breakdown, no evidence of rash  Motor 4/5 throughout left arm/leg, RUE/RLE 5/5 except Right ankle NT due to CAM walker  Musc: right knee tender with ROM  Assessment/Plan: 1. Functional deficits which require 3+ hours per day of interdisciplinary therapy in a comprehensive inpatient rehab setting.  Physiatrist is providing close team supervision and 24 hour management of active medical problems listed below.  Physiatrist and rehab team continue to assess barriers to discharge/monitor patient progress toward functional and medical goals  Care Tool:  Bathing    Body parts bathed by patient: Right arm,Left arm,Chest,Abdomen,Front perineal area,Buttocks,Right upper leg,Left upper leg,Face,Left lower leg,Right lower leg   Body parts bathed by helper: Left lower leg,Right lower leg,Buttocks     Bathing assist  Assist Level: Minimal Assistance - Patient > 75%     Upper Body Dressing/Undressing Upper body dressing   What is the patient wearing?: Pull over shirt    Upper body assist Assist Level: Supervision/Verbal cueing    Lower Body Dressing/Undressing Lower body dressing      What is the patient wearing?: Pants,Underwear/pull up     Lower body assist Assist for lower body dressing: Minimal Assistance - Patient > 75%     Toileting Toileting    Toileting assist Assist for toileting: Minimal Assistance - Patient > 75% (sit to stand from lower toilet)     Transfers Chair/bed transfer  Transfers assist     Chair/bed transfer assist level: Contact Guard/Touching assist Chair/bed transfer assistive device: Therapist, sports   Ambulation assist      Assist level: Contact Guard/Touching assist Assistive device: Walker-rolling Max distance: 193f   Walk 10 feet activity   Assist     Assist level: Contact Guard/Touching assist Assistive device: Walker-rolling   Walk 50 feet activity   Assist    Assist level: Contact Guard/Touching assist Assistive device: Walker-rolling    Walk 150 feet activity   Assist Walk 150 feet activity did not occur: Safety/medical concerns  Assist level: Contact Guard/Touching assist Assistive device: Walker-rolling    Walk 10 feet on uneven surface  activity   Assist Walk 10 feet on uneven surfaces activity did not occur: Safety/medical concerns         Wheelchair  Assist Will patient use wheelchair at discharge?: No             Wheelchair 50 feet with 2 turns activity    Assist            Wheelchair 150 feet activity     Assist          Blood pressure (!) 139/93, pulse 73, temperature 98 F (36.7 C), resp. rate 18, height 5' 9"  (1.753 m), weight 97.7 kg, SpO2 97 %.  Medical Problem List and Plan: 1.  Impaired mobility and ADLs secondary to right sided acute  subcortical  CVA and right sided ankle fracture             -patient may shower             -ELOS/Goals: 2/24 modI   Continue CIR PT, OT-Team conference today please see physician documentation under team conference tab, met with team  to discuss problems,progress, and goals. Formulized individual treatment plan based on medical history, underlying problem and comorbidities.  2.  Antithrombotics: -DVT/anticoagulation:  Pharmaceutical: Conitnue Xarelto             -antiplatelet therapy: N/A 3. Pain Management: Continue icing right knee. D/c'd oxycodone- not requiring Severe tricompartmental Right knee OA, voltaren gel  4. Mood: LCSW to follow for evaluation and support.              -antipsychotic agents: N/A 5. Neuropsych: This patient is capable of making decisions on his own behalf. 6. Skin/Wound Care: Routine pressure relief measures.  7. Fluids/Electrolytes/Nutrition: Monitor I/O. CMET reviewed and stable 8. Right ankle bimalleolar fx, lateral> medial Fx: WBAT with CAM boot. Wife apparently interested in finding out what ortho plan is for right ankle ( Dr. Stann Mainland).repeat xrays show no callus formation but good alignment -  Injury was on Jan 5th. On Vit D , add Ca++ 9. CAD s/p CABG: On Lipitor  10 A fib: Monitor HR tid--continue Xarelto. Resume metoprolol  11. HTN: Monitor BP tid--has been controlled without medications. Avoid hypoperfusion--out of 5 day window.              --Well controlled: continue metoprolol. Continue to hold Lasix, Diovan and Amlodipine--low dose due to SE)  Vitals:   08/24/20 1942 08/25/20 0618  BP: 127/74 (!) 139/93  Pulse: 68 73  Resp: 16 18  Temp: 97.8 F (36.6 C) 98 F (36.7 C)  SpO2: 100% 97%  improved control  12.   H/o gout: Managed on allopurinol.  13  diabetes- on metformin low dose  was not on this at home        14. Vitamin B 12 deficiency: Resume supplement.  15. Left knee OA: Ice TID. He has received neoprene knee sleeve.  Quadriceps  strengthening exercises.  16. Obesity (BMI 30.51): He will benefit from dietary counseling.  17. Chronic Phimosis: patient to continue to reduce nightly.              --Frequency managed with pads.  18. Disposition: plan is for d/c home with wife, who is a former Therapist, sports. He has home wheelchair, grab bar, chair lift is being installed  19.  Constipation add colace- had 2 BMs 2/17  LOS: 14 days A FACE TO [place] E Lyberti Thrush 08/25/2020, 7:58 AM

## 2020-08-26 DIAGNOSIS — I6350 Cerebral infarction due to unspecified occlusion or stenosis of unspecified cerebral artery: Secondary | ICD-10-CM | POA: Diagnosis not present

## 2020-08-26 DIAGNOSIS — I2510 Atherosclerotic heart disease of native coronary artery without angina pectoris: Secondary | ICD-10-CM | POA: Diagnosis not present

## 2020-08-26 DIAGNOSIS — I4891 Unspecified atrial fibrillation: Secondary | ICD-10-CM | POA: Diagnosis not present

## 2020-08-26 DIAGNOSIS — I6309 Cerebral infarction due to thrombosis of other precerebral artery: Secondary | ICD-10-CM

## 2020-08-26 DIAGNOSIS — E782 Mixed hyperlipidemia: Secondary | ICD-10-CM | POA: Diagnosis not present

## 2020-08-26 DIAGNOSIS — I10 Essential (primary) hypertension: Secondary | ICD-10-CM | POA: Diagnosis not present

## 2020-08-26 MED ORDER — ATORVASTATIN CALCIUM 40 MG PO TABS: 40.0000 mg | ORAL_TABLET | Freq: Every day | ORAL | 0 refills | Status: DC

## 2020-08-26 MED ORDER — CALCIUM CITRATE 950 (200 CA) MG PO TABS: 1.0000 | ORAL_TABLET | Freq: Two times a day (BID) | ORAL | Status: DC

## 2020-08-26 MED ORDER — METFORMIN HCL 500 MG PO TABS: 250.0000 mg | ORAL_TABLET | Freq: Every day | ORAL | 0 refills | Status: DC

## 2020-08-26 MED ORDER — BLOOD GLUCOSE MONITOR KIT: PACK | 0 refills | Status: DC

## 2020-08-26 MED ORDER — DOCUSATE SODIUM 100 MG PO CAPS: 100.0000 mg | ORAL_CAPSULE | Freq: Every day | ORAL | 0 refills | Status: DC

## 2020-08-26 MED ORDER — DICLOFENAC SODIUM 1 % EX GEL: 2.0000 g | Freq: Four times a day (QID) | CUTANEOUS | 0 refills | Status: DC

## 2020-08-26 NOTE — Progress Notes (Signed)
[place] PHYSICAL MEDICINE & REHABILITATION PROGRESS NOTE   Subjective/Complaints:  Feels ready to go home, no bowel or bladder issues, good BM this am   ROS: Patient denies CP, SOB,  N/V/D, pain  Objective:   No results found. No results for input(s): WBC, HGB, HCT, PLT in the last 72 hours. No results for input(s): NA, K, CL, CO2, GLUCOSE, BUN, CREATININE, CALCIUM in the last 72 hours.  Intake/Output Summary (Last 24 hours) at 08/26/2020 0737 Last data filed at 08/26/2020 0453 Gross per 24 hour  Intake 583 ml  Output 600 ml  Net -17 ml        Physical Exam: Vital Signs Blood pressure (!) 143/76, pulse 90, temperature 98 F (36.7 C), temperature source Oral, resp. rate 16, height 5\' 9"  (1.753 m), weight 97.7 kg, SpO2 97 %.   General: No acute distress Mood and affect are appropriate Heart: Regular rate and rhythm no rubs murmurs or extra sounds Lungs: Clear to auscultation, breathing unlabored, no rales or wheezes Abdomen: Positive bowel sounds, soft nontender to palpation, nondistended Extremities: No clubbing, cyanosis, or edema Skin: No evidence of breakdown, no evidence of rash  Motor 4/5 throughout left arm/leg, RUE/RLE 5/5 except Right ankle NT due to CAM walker  Musc: right knee tender with ROM  Assessment/Plan: 1. Functional deficits due to CVA and R ankle fx Stable for D/C today F/u PCP in 3-4 weeks F/u PM&R 2 weeks See D/C summary See D/C instructions  Care Tool:  Bathing    Body parts bathed by patient: Right arm,Left arm,Chest,Abdomen,Front perineal area,Buttocks,Right upper leg,Left upper leg,Face,Left lower leg,Right lower leg   Body parts bathed by helper: Left lower leg,Right lower leg,Buttocks     Bathing assist Assist Level: Supervision/Verbal cueing     Upper Body Dressing/Undressing Upper body dressing   What is the patient wearing?: Pull over shirt    Upper body assist Assist Level: Independent with assistive device    Lower  Body Dressing/Undressing Lower body dressing      What is the patient wearing?: Pants,Underwear/pull up     Lower body assist Assist for lower body dressing: Minimal Assistance - Patient > 75%     Toileting Toileting    Toileting assist Assist for toileting: Minimal Assistance - Patient > 75%     Transfers Chair/bed transfer  Transfers assist     Chair/bed transfer assist level: Supervision/Verbal cueing Chair/bed transfer assistive device: Programmer, multimedia   Ambulation assist      Assist level: Contact Guard/Touching assist Assistive device: Walker-rolling Max distance: 157ft   Walk 10 feet activity   Assist     Assist level: Supervision/Verbal cueing Assistive device: Walker-rolling   Walk 50 feet activity   Assist    Assist level: Supervision/Verbal cueing Assistive device: Walker-rolling    Walk 150 feet activity   Assist Walk 150 feet activity did not occur: Safety/medical concerns  Assist level: Contact Guard/Touching assist Assistive device: Walker-rolling    Walk 10 feet on uneven surface  activity   Assist Walk 10 feet on uneven surfaces activity did not occur: Safety/medical concerns   Assist level: Contact Guard/Touching assist Assistive device: Aeronautical engineer Will patient use wheelchair at discharge?: No             Wheelchair 50 feet with 2 turns activity    Assist            Wheelchair 150 feet activity  Assist          Blood pressure (!) 143/76, pulse 90, temperature 98 F (36.7 C), temperature source Oral, resp. rate 16, height 5\' 9"  (1.753 m), weight 97.7 kg, SpO2 97 %.  Medical Problem List and Plan: 1.  Impaired mobility and ADLs secondary to right sided acute subcortical  CVA and right sided ankle fracture             -patient may shower             -ELOS/Goals: 2/24 modI   D/C home today   2.  Antithrombotics: -DVT/anticoagulation:   Pharmaceutical: Conitnue Xarelto             -antiplatelet therapy: N/A 3. Pain Management: Continue icing right knee. D/c'd oxycodone- not requiring Severe tricompartmental Right knee OA, voltaren gel  4. Mood: LCSW to follow for evaluation and support.              -antipsychotic agents: N/A 5. Neuropsych: This patient is capable of making decisions on his own behalf. 6. Skin/Wound Care: Routine pressure relief measures.  7. Fluids/Electrolytes/Nutrition: Monitor I/O. CMET reviewed and stable 8. Right ankle bimalleolar fx, lateral> medial Fx: WBAT with CAM boot. Wife apparently interested in finding out what ortho plan is for right ankle ( Dr. Stann Mainland).repeat xrays show no callus formation but good alignment -  Injury was on Jan 5th. On Vit D , add Ca++ 9. CAD s/p CABG: On Lipitor  10 A fib: Monitor HR tid--continue Xarelto. Resume metoprolol  11. HTN: Monitor BP tid--has been controlled without medications. Avoid hypoperfusion--out of 5 day window.              --Well controlled: continue metoprolol. Continue to hold Lasix, Diovan and Amlodipine--low dose due to SE)  Vitals:   08/25/20 2003 08/26/20 0449  BP: (!) 145/79 (!) 143/76  Pulse: 63 90  Resp: 16 16  Temp: 97.9 F (36.6 C) 98 F (36.7 C)  SpO2: 99% 97%  controlled 2/24 12.   H/o gout: Managed on allopurinol.  13  diabetes- on metformin low dose  was not on this at home        14. Vitamin B 12 deficiency: Resume supplement.  15. Left knee OA: Ice TID. He has received neoprene knee sleeve.  Quadriceps strengthening exercises.  16. Obesity (BMI 30.51): He will benefit from dietary counseling.  17. Chronic Phimosis: patient to continue to reduce nightly.              --Frequency managed with pads.  18. Disposition: plan is for d/c home with wife, who is a former Therapist, sports. He has home wheelchair, grab bar, chair lift is being installed  19.  Constipation add colace- had 2 BMs 2/17  LOS: 15 days A FACE TO [place]oh E Venancio Chenier 08/26/2020, 7:37 AM

## 2020-08-26 NOTE — Progress Notes (Signed)
Inpatient Rehabilitation Care Coordinator Discharge Note  The overall goal for the admission was met for:   Discharge location: Yes, home  Length of Stay: Yes, 15 days  Discharge activity level: Yes  Home/community participation: Yes  Services provided included: MD, RD, PT, OT, SLP, RN, CM, TR, Pharmacy, Neuropsych and SW  Financial Services: Private Insurance: [employer]  Choices offered to/list presented [id number]  Follow-up services arranged: Outpatient: Neuro OP  Comments (or additional information):  Patient/Family verbalized understanding of follow-up arrangements: Yes  Individual responsible for coordination of the follow-up plan: [name] [phone]  Confirmed correct DME delivered: [name] 08/26/2020    [name]

## 2020-08-26 NOTE — Plan of Care (Signed)
Pt to d/c to home, wife at bedside and ready to go.

## 2020-08-26 NOTE — Discharge Summary (Signed)
Physician Discharge Summary  Patient ID: Shane Espinoza MRN: [id number] DOB/AGE: 08-Apr-1941 80 y.o.  Admit date: 08/11/2020 Discharge date: 08/26/2020  Discharge Diagnoses:  Principal Problem:   Stroke (cerebrum) [facility]) Active Problems:   Hypertension   Atrial fibrillation ([place])   Diabetes ([place])   Ankle fracture, right   Knee pain   Discharged Condition: stable   Significant Diagnostic Studies: DG Ankle Complete Right  Result Date: 08/16/2020 CLINICAL DATA:  Fracture. EXAM: RIGHT ANKLE - COMPLETE 3+ VIEW COMPARISON:  07/08/2020 FINDINGS: Two view exam again shows the oblique fracture of the distal fibula. No evidence for bridging bony callus. Ankle mortise is largely preserved. Age indeterminate avulsion injury noted medial malleolus. IMPRESSION: 1. Oblique fracture of the distal fibula is still visible without evidence for bridging bony callus. 2. Age indeterminate avulsion injury of the medial malleolus. Electronically Signed   By: Misty Stanley M.D.   On: 08/16/2020 16:15    Labs:  Basic Metabolic Panel: BMP Latest Ref Rng & Units 08/23/2020 08/16/2020 08/12/2020  Glucose 70 - 99 mg/dL 113(H) 111(H) 127(H)  BUN 8 - 23 mg/dL 17 21 17   Creatinine 0.61 - 1.24 mg/dL 1.21 1.15 1.24  BUN/Creat Ratio 10 - 24 - - -  Sodium 135 - 145 mmol/L 140 137 138  Potassium 3.5 - 5.1 mmol/L 4.2 3.8 4.4  Chloride 98 - 111 mmol/L 104 104 105  CO2 22 - 32 mmol/L 28 23 25   Calcium 8.9 - 10.3 mg/dL 9.5 8.9 9.1    CBC: CBC Latest Ref Rng & Units 08/23/2020 08/16/2020 08/12/2020  WBC 4.0 - 10.5 K/uL 7.1 8.3 9.0  Hemoglobin 13.0 - 17.0 g/dL 14.9 14.3 14.3  Hematocrit 39.0 - 52.0 % 46.0 43.6 44.7  Platelets 150 - 400 K/uL 202 179 180    CBG: No results for input(s): GLUCAP in the last 168 hours.  Brief HPI:   Shane Espinoza is a 80 y.o. male with history of CF, CAD s/p CABG, CVA, A. fib-on Xarelto, recent nondisplaced left lateral malleolus fracture secondary to fall 07/08/20 who was  admitted on 08/05/2020 with left-sided weakness and numbness and recurrent fall.  LLE symptoms resolved but he continued to have some weakness in left hand.  MRI of brain showed focus of restricted diffusion with acute/subacute infarct and periventricular white matter.  Dr. Leonie Man felt that stroke was due to small vessel disease and patient was educated on importance of taking medications at the same time with the largest meal of the day.  Patient continued to have limitations due to LE weakness with instability, lateral lean as well as delay in processing with STM deficits.  CIR was recommended due to functional decline.   Hospital Course: Shane Espinoza was admitted to rehab 08/11/2020 for inpatient therapies to consist of PT and OT at least three hours five days a week. Past admission physiatrist, therapy team and rehab RN have worked together to provide customized collaborative inpatient rehab.  He continues on Xarelto daily and is tolerating this without side effect.  Follow-up CBC shows H&H and platelets to be stable.  Check of BMET showed electrolytes and renal status to be WNL.  His heart rate and blood pressures were monitored on TID basis and are currently controlled on metoprolol alone. Bowel program was augmented to help manage constipation. Metformin was added for BS control and his diabetes has been monitored with ac/hs CBG checks and SSI was use prn for tighter BS control.  He has been educated on  carb modified diet and blood sugars were showing improvement in control.  Follow-up x-rays were done on right ankle showing minimal callus formation.  Calcium was added for supplementation.  He did report right knee pain with increase in activity and this has been treated with ice, Voltaren gel and quad strengthening exercises.  Dr. Rodenbough/neuropsychologist has worked with patient on coping and adjustment issues.   Rehab course: During patient's stay in rehab weekly team conferences were held  to monitor patient's progress, set goals and discuss barriers to discharge. At admission, patient required mod assist with basic ADL tasks and with mobility.  Speech therapy evaluation revealed that cognitive linguistic abilities were within functional limits and no speech therapy needed during his hospitalization. He  has had improvement in activity tolerance, balance, postural control as well as ability to compensate for deficits.  He is able to complete ADL tasks with min to min guard assist. He requires supervision for transfers and to ambulate with use of RW.  Family education was completed with wife.   Discharge disposition: 01-Home or Self Care  Diet:  Heart Healthy/Carb Modified.   Special Instructions: 1.  Continue with cam boot at all times.  May remove for gentle range of motion right ankle. 2.  No driving or strenuous activity till cleared by MD.  Discharge Instructions    Ambulatory referral to Neurology   Complete by: As directed    An appointment is requested in approximately: 3 weeks   Ambulatory referral to Physical Medicine Rehab   Complete by: As directed    3-4 weeks follow up appointment     Allergies as of 08/26/2020   No Known Allergies     Medication List    STOP taking these medications   furosemide 20 MG tablet Commonly known as: LASIX   oxyCODONE-acetaminophen 5-325 MG tablet Commonly known as: PERCOCET/ROXICET     TAKE these medications   acetaminophen 500 MG tablet Commonly known as: TYLENOL Take 500 mg by mouth every 6 (six) hours as needed for moderate pain.   Align 4 MG Caps Take 1 capsule by mouth every other day.   allopurinol 100 MG tablet Commonly known as: ZYLOPRIM Take 1 tablet by mouth daily.   atorvastatin 40 MG tablet Commonly known as: LIPITOR Take 1 tablet (40 mg total) by mouth at bedtime.   beta carotene w/minerals tablet Take 1 tablet by mouth daily.   blood glucose meter kit and supplies Kit Dispense based on patient  and insurance preference. Use up to four times daily as directed. (FOR ICD-9 250.00, 250.01).   calcium citrate 950 (200 Ca) MG tablet Commonly known as: CALCITRATE - dosed in mg elemental calcium Take 1 tablet (200 mg of elemental calcium total) by mouth 2 (two) times daily.   diclofenac Sodium 1 % Gel Commonly known as: VOLTAREN Apply 2 g topically 4 (four) times daily. To right knee   docusate sodium 100 MG capsule Commonly known as: COLACE Take 1 capsule (100 mg total) by mouth daily. Notes to patient: Over the counter   fish oil-omega-3 fatty acids 1000 MG capsule Take 1 g by mouth 3 (three) times a week.   metFORMIN 500 MG tablet Commonly known as: GLUCOPHAGE Take 0.5 tablets (250 mg total) by mouth daily with breakfast.   metoprolol succinate 25 MG 24 hr tablet Commonly known as: TOPROL-XL Take 1 tablet (25 mg total) by mouth daily.   vitamin B-12 1000 MCG tablet Commonly known as: CYANOCOBALAMIN Take 1,000 mcg  by mouth every evening.   Vitamin D3 125 MCG (5000 UT) Caps Take by mouth. Monday, Wednesday, Friday   Xarelto 20 MG Tabs tablet Generic drug: rivaroxaban TAKE 1 TABLET DAILY WITH   SUPPER       Follow-up Information    Kirsteins, Luanna Salk, MD Follow up.   Specialty: Physical Medicine and Rehabilitation Why: Office will call you with follow up appointment Contact information: [place] [address] [phone]        Josetta Huddle, MD. Call.   Specialty: Internal Medicine Why: for post hospital follow up Contact information: [address] [place] [address] [phone]        Nicholes Stairs, MD. Call.   Specialty: Orthopedic Surgery Why: for follow up appointment Contact information: [address] [place] [address] [phone]        [facility] Follow up.   Why: for stroke follow up Contact information: [address]     [place] [address] [phone]              Signed: Bary Leriche 08/30/2020, 5:21 PM

## 2020-08-26 NOTE — Plan of Care (Signed)
  Problem: Consults Goal: RH STROKE PATIENT EDUCATION Description: See Patient Education module for education specifics  Outcome: Progressing Goal: Nutrition Consult-if indicated Outcome: Progressing   Problem: RH BOWEL ELIMINATION Goal: RH STG MANAGE BOWEL WITH ASSISTANCE Description: STG Manage Bowel with supervision Assistance. Outcome: Progressing Goal: RH STG MANAGE BOWEL W/MEDICATION W/ASSISTANCE Description: STG Manage Bowel with Medication with mod I Assistance. Outcome: Progressing   Problem: RH BLADDER ELIMINATION Goal: RH STG MANAGE BLADDER WITH ASSISTANCE Description: STG Manage Bladder With supervision Assistance Outcome: Progressing Goal: RH STG MANAGE BLADDER WITH MEDICATION WITH ASSISTANCE Description: STG Manage Bladder With Medication With mod I Assistance. Outcome: Progressing   Problem: RH SKIN INTEGRITY Goal: RH STG MAINTAIN SKIN INTEGRITY WITH ASSISTANCE Description: STG Maintain Skin Integrity With supervision Assistance. Outcome: Progressing Goal: RH STG ABLE TO PERFORM INCISION/WOUND CARE W/ASSISTANCE Description: STG Able To Perform Incision/Wound Care With supervision Assistance. Outcome: Progressing   Problem: RH SAFETY Goal: RH STG ADHERE TO SAFETY PRECAUTIONS W/ASSISTANCE/DEVICE Description: STG Adhere to Safety Precautions With supervision Assistance/Device. Outcome: Progressing Goal: RH STG DECREASED RISK OF FALL WITH ASSISTANCE Description: STG Decreased Risk of Fall With supervision Assistance. Outcome: Progressing   Problem: RH PAIN MANAGEMENT Goal: RH STG PAIN MANAGED AT OR BELOW PT'S PAIN GOAL Description: <4 on a 0-10 pain scale. Outcome: Progressing   Problem: RH KNOWLEDGE DEFICIT Goal: RH STG INCREASE KNOWLEDGE OF HYPERTENSION Description: Patient will demonstrate knowledge of medication management, dietary management, BP parameters with educational materials and handouts provided by staff. Outcome: Progressing Goal: RH STG  INCREASE KNOWLEDGE OF STROKE PROPHYLAXIS Description: Patient will demonstrate knowledge of medications used to help prevention future strokes with educational materials and handouts provided by staff. Outcome: Progressing

## 2020-08-30 ENCOUNTER — Encounter: Payer: Self-pay | Admitting: Physical Therapy

## 2020-08-30 ENCOUNTER — Ambulatory Visit: Payer: Medicare HMO | Attending: Physician Assistant | Admitting: Physical Therapy

## 2020-08-30 ENCOUNTER — Other Ambulatory Visit: Payer: Self-pay

## 2020-08-30 ENCOUNTER — Ambulatory Visit: Payer: Medicare HMO | Admitting: Occupational Therapy

## 2020-08-30 DIAGNOSIS — M6281 Muscle weakness (generalized): Secondary | ICD-10-CM

## 2020-08-30 DIAGNOSIS — E1122 Type 2 diabetes mellitus with diabetic chronic kidney disease: Secondary | ICD-10-CM

## 2020-08-30 DIAGNOSIS — R2681 Unsteadiness on feet: Secondary | ICD-10-CM | POA: Diagnosis not present

## 2020-08-30 DIAGNOSIS — R278 Other lack of coordination: Secondary | ICD-10-CM

## 2020-08-30 DIAGNOSIS — Z9181 History of falling: Secondary | ICD-10-CM | POA: Diagnosis not present

## 2020-08-30 DIAGNOSIS — M25569 Pain in unspecified knee: Secondary | ICD-10-CM

## 2020-08-30 DIAGNOSIS — R2689 Other abnormalities of gait and mobility: Secondary | ICD-10-CM | POA: Diagnosis not present

## 2020-08-30 DIAGNOSIS — E119 Type 2 diabetes mellitus without complications: Secondary | ICD-10-CM

## 2020-08-30 DIAGNOSIS — S82891A Other fracture of right lower leg, initial encounter for closed fracture: Secondary | ICD-10-CM

## 2020-08-30 NOTE — Discharge Instructions (Signed)
Inpatient Rehab Discharge Instructions  [facility] Discharge date and time:  08/26/20  Activities/Precautions/ Functional Status: Activity: no lifting, driving, or strenuous exercise till cleared by MD Diet: cardiac diet Wound Care: none needed    Functional status:  ___ No restrictions     ___ Walk up steps independently _X__ 24/7 supervision/assistance   ___ Walk up steps with assistance ___ Intermittent supervision/assistance  ___ Bathe/dress independently ___ Walk with walker     ___ Bathe/dress with assistance ___ Walk Independently    ___ Shower independently _X__ Walk with assistance    _X__ Shower with assistance ___ No alcohol     ___ Return to work/school ________  COMMUNITY REFERRALS UPON DISCHARGE:    Outpatient: PT     OT                 Agency: [facility] Phone: [phone]              Appointment Date/Time: TBD by facility   Special Instructions: 1. Need to wear boot on right foot--can remove for range of motion excercises 3-4 times a day. 2. Continue to perform home exercises 2-3 times a day.    STROKE/TIA DISCHARGE INSTRUCTIONS SMOKING Cigarette smoking nearly doubles your risk of having a stroke & is the single most alterable risk factor  If you smoke or have smoked in the last 12 months, you are advised to quit smoking for your health.  Most of the excess cardiovascular risk related to smoking disappears within a year of stopping.  Ask you doctor about anti-smoking medications  [place] Quit Line: 1-800-QUIT NOW  Free Smoking Cessation Classes [phone]  CHOLESTEROL Know your levels; limit fat & cholesterol in your diet  Lipid Panel     Component Value Date/Time   CHOL 101 08/06/2020 0604   TRIG 102 08/06/2020 0604   HDL 36 (L) 08/06/2020 0604   CHOLHDL 2.8 08/06/2020 0604   VLDL 20 08/06/2020 0604   LDLCALC 45 08/06/2020 0604      Many patients benefit from treatment even if their cholesterol is at  goal.  Goal: Total Cholesterol (CHOL) less than 160  Goal:  Triglycerides (TRIG) less than 150  Goal:  HDL greater than 40  Goal:  LDL (LDLCALC) less than 100   BLOOD PRESSURE American Stroke Association blood pressure target is less that 120/80 mm/Hg  Your discharge blood pressure is:  BP: 140/69  Monitor your blood pressure  Limit your salt and alcohol intake  Many individuals will require more than one medication for high blood pressure  DIABETES (A1c is a blood sugar average for last 3 months) Goal HGBA1c is under 7% (HBGA1c is blood sugar average for last 3 months)  Diabetes:     Lab Results  Component Value Date   HGBA1C 6.5 (H) 08/06/2020     Your HGBA1c can be lowered with medications, healthy diet, and exercise.  Check your blood sugar as directed by your physician  Call your physician if you experience unexplained or low blood sugars.  PHYSICAL ACTIVITY/REHABILITATION Goal is 30 minutes at least 4 days per week  Activity: No driving, Therapies: see above Return to work: N/A  Activity decreases your risk of heart attack and stroke and makes your heart stronger.  It helps control your weight and blood pressure; helps you relax and can improve your mood.  Participate in a regular exercise program.  Talk with your doctor about the best form of exercise for you (  dancing, walking, swimming, cycling).  DIET/WEIGHT Goal is to maintain a healthy weight  Your discharge diet is:  Diet Order            Diet heart healthy/carb modified Room service appropriate? Yes; Fluid consistency: Thin  Diet effective now                liquids Your height is:  Height: 5\' 9"  (175.3 cm) Your current weight is: Weight: 97.7 kg Your Body Mass Index (BMI) is:  BMI (Calculated): 31.79  Following the type of diet specifically designed for you will help prevent another stroke.  Your goal weight is 169 lbs  Your goal Body Mass Index (BMI) is 19-24.  Healthy food habits can help  reduce 3 risk factors for stroke:  High cholesterol, hypertension, and excess weight.  RESOURCES Stroke/Support Group:  Call [phone]   STROKE EDUCATION PROVIDED/REVIEWED AND GIVEN TO PATIENT Stroke warning signs and symptoms How to activate emergency medical system (call 911). Medications prescribed at discharge. Need for follow-up after discharge. Personal risk factors for stroke. Pneumonia vaccine given:  Flu vaccine given:  My questions have been answered, the writing is legible, and I understand these instructions.  I will adhere to these goals & educational materials that have been provided to me after my discharge from the hospital.     My questions have been answered and I understand these instructions. I will adhere to these goals and the provided educational materials after my discharge from the hospital.  Patient/Caregiver Signature _______________________________ Date __________  Clinician Signature _______________________________________ Date __________  Please bring this form and your medication list with you to all your follow-up doctor's appointments.

## 2020-08-30 NOTE — Therapy (Signed)
[facility] [address] [place], [place], [address] Phone: [phone]   Fax:  [phone]  Occupational Therapy Evaluation  Patient Details  Name: Shane Espinoza MRN: [id number] Date of Birth: 04-09-1941 Referring Provider (OT): Dr. Letta Pate   Encounter Date: 08/30/2020   OT End of Session - 08/30/20 1510    Visit Number 1    Number of Visits 9    Date for OT Re-Evaluation 09/27/20    Authorization Type Aetna MCR - VL: [place], $35 copay per day    OT Start Time 1300    OT Stop Time 1400    OT Time Calculation (min) 60 min    Activity Tolerance Patient tolerated treatment well    Behavior During Therapy [facility] for tasks assessed/performed           Past Medical History:  Diagnosis Date  . Anxiety   . Atrial fibrillation ([place])   . Biceps tendon tear    right  . CAD (coronary artery disease)    [name]  . Gout   . Hypertension   . 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 CARDIAC CATHETERIZATION  03/02/2011   Recommended CABG  . COLON SURGERY    . CORONARY ARTERY BYPASS GRAFT  03/07/2011   DR.VAN  TRIGHT  . EYE SURGERY    . LEXISCAN MYOVIEW  02/20/2012   EKG negative for ischemia, noraml study  . TRANSTHORACIC ECHOCARDIOGRAM  02/20/2012   EF 50-55%, mild-moderate concentric LVH, LA moderately dilated, moderate mitral regurg  . VASCULAR DOPPLER  03/03/2011   Nosignificant extracranial carotid artery stenosis    There were no vitals filed for this visit.   Subjective Assessment - 08/30/20 1310    Subjective  My Rt knee gives me trouble; that's why I think I fell. (wife reports trouble with popliteal fossa)    Patient is accompanied by: Family member   wife   Pertinent History Rt CVA 08/05/20, ankle fx 07/07/20 from fall. PMH: CAF, CAD s/p CABG 2012, CVA 2015 (completed resolved), HTN, A-fib,    Limitations CAM boot, WBAT, No driving, no heavy lifting  (old Rt biceps rupture)    Currently in Pain? No/denies             [facility] OT Assessment - 08/30/20 0001      Assessment   Medical Diagnosis Rt CVA   Rt ankle fx 07/08/20   Referring Provider (OT) Dr. Letta Pate    Onset Date/Surgical Date 08/05/20    Hand Dominance Right    Prior Therapy CIR      Precautions   Precautions Other (comment)    Precaution Comments CAM boot - WBAT      Restrictions   Weight Bearing Restrictions Yes    RLE Weight Bearing Weight bearing as tolerated      Balance Screen   Has the patient fallen in the past 6 months Yes    How many times? [place] Shower/Tub Walk-in Shower;Curtain   grab bars, built in Medical laboratory scientific officer sponge;Reacher    Additional Comments Pt lives with wife in 2 story home with 3 steps to enter and has stair lift for 13 steps inside. Has been sponge bathing in 1/2 bath downstairs (d/t chairlift just put in). DME: transport w/c, RW, chair lift, stair lift, BSC  Lives With Spouse      Prior Function   Level of Independence Independent   prior to ankle fx (Jan 2022)   Vocation Retired    Leisure reading, watching TV      ADL   Eating/Feeding Modified independent    Grooming Modified independent   seated   Upper Body Bathing Modified independent   seated - sponge bathing   Lower Body Bathing Modified independent   seated - sponge bathing   Upper Body Dressing Increased time    Lower Body Dressing Increased time   keeps Lt shoe tied, can get on CAM boot Rt foot   Toilet Transfer Minimal assistance    Toileting - Clothing Manipulation Moderate assistance    [place] Transfer --   has not yet attempted   ADL comments has not yet attempted IADLS - but did not cook or do housework      IADL   Medication Management Takes responsibility if medication is prepared in advance in seperate dosage    Financial Management Manages financial  matters independently (budgets, writes checks, pays rent, bills goes to bank), collects and keeps track of income      Mobility   Mobility Status Needs assist    Mobility Status Comments w/c for community, walks w/ walker (help sit to stand)      Written Expression   Dominant Hand Right    Handwriting --   no changes     Vision - History   Baseline Vision Wears glasses only for reading    Additional Comments no changes from stroke      Cognition   Overall Cognitive Status Within Functional Limits for tasks assessed    Cognition Comments Had a hard time following directions for MMT      Observation/Other Assessments   Observations HOH, Rt ankle fx w/ CAM boot      Sensation   Light Touch Appears Intact      Coordination   9 Hole Peg Test Right;Left    Right 9 Hole Peg Test 31 sec    Left 9 Hole Peg Test 39.94 sec      Edema   Edema none in UE's      ROM / Strength   AROM / PROM / Strength AROM;Strength      AROM   Overall AROM Comments BUE AROM WFL's      Strength   Overall Strength Comments BUE MMT grossly 5/5      Hand Function   Right Hand Grip (lbs) 61.2 lbs    Left Hand Grip (lbs) 50.9 lbs                           OT Education - 08/30/20 1511    Education Details Goals and OT POC    Person(s) Educated Patient;Spouse    Methods Explanation    Comprehension Verbalized understanding               OT Long Term Goals - 08/30/20 1520      OT LONG TERM GOAL #1   Title Independent with UB strengthening HEP and Lt hand coordination HEP    Time 4    Period Weeks    Status New      OT LONG TERM GOAL #2   Title Pt to perform toilet and shower transfers consistently w/ supervision only    Time 4  Period Weeks    Status New      OT LONG TERM GOAL #3   Title Pt to stand to perform clothes management after toileting mod I level    Time 4    Period Weeks    Status New      OT LONG TERM GOAL #4   Title Pt to improve coordination  Lt hand as evidenced by performing 9 hole peg test in 35 sec or less    Baseline 39.94 sec    Time 4    Period Weeks    Status New      OT LONG TERM GOAL #5   Title Pt to perform dynamic standing task w/ countertop support prn for 10 min w/o rest    Time 4    Period Weeks    Status New                 Plan - 08/30/20 1512    Clinical Impression Statement Patient is a 80 y.o. year old male with history of CAF, CAD s/p CABG 2012, CVA 2015, A fib- on Xarelto,  fall 07/08/20 with nondisplaced right lateral malleolus and adjacent avulsion fracture of medial malleolus.  The patient was walking with his walker and then noticed acute left arm weakness, shaking and some leg weakness.MRI brain showed right sided acute subcortical CVA on 08/05/20. Pt presents today for OPOT evaluation w/ remaining deficits in transfers, mobility, and mild coordination Lt hand. Pt would benefit from O.T. to address these deficits and return pt to PLOF.    OT Occupational Profile and History Problem Focused Assessment - Including review of records relating to presenting problem    Occupational performance deficits (Please refer to evaluation for details): ADL's;IADL's;Social Participation    Body Structure / Function / Physical Skills Balance;Strength;IADL;Mobility;Decreased knowledge of precautions;FMC;Coordination;ADL    Rehab Potential Good    Clinical Decision Making Several treatment options, min-mod task modification necessary    Comorbidities Affecting Occupational Performance: Presence of comorbidities impacting occupational performance    Comorbidities impacting occupational performance description: Rt ankle fx    Modification or Assistance to Complete Evaluation  No modification of tasks or assist necessary to complete eval    OT Frequency 2x / week    OT Duration 4 weeks   plus eval   OT Treatment/Interventions Self-care/ADL training;DME and/or AE instruction;Therapeutic activities;Therapeutic  exercise;Coping strategies training;Functional Mobility Training;Neuromuscular education;Passive range of motion;Patient/family education;Energy conservation    Plan coordination HEP, work on transfers, pt also to bring in previous UE HEP to revise/update prn    Consulted and Agree with Plan of Care Patient;Family member/caregiver    Family Member Consulted wife           Patient will benefit from skilled therapeutic intervention in order to improve the following deficits and impairments:   Body Structure / Function / Physical Skills: Balance,Strength,IADL,Mobility,Decreased knowledge of precautions,FMC,Coordination,ADL       Visit Diagnosis: Unsteadiness on feet  Other lack of coordination  Muscle weakness (generalized)    Problem List Patient Active Problem List   Diagnosis Date Noted  . Stroke (cerebrum) ([place]) 08/11/2020  . Acute CVA (cerebrovascular accident) ([place]) 08/06/2020  . TIA (transient ischemic attack) 08/05/2020  . Gout 12/07/2013  . Atrial fibrillation ([place]) 12/07/2013  . Acute thrombotic stroke ([place]) 09/23/2013  . CVA (cerebral infarction) 09/23/2013  . HTN (hypertension) 04/16/2013  . Hyperlipidemia with target LDL less than 70 04/16/2013  . CAD (coronary artery disease)   . Anxiety   .  Hypertension   . S/P CABG x 4     Carey Bullocks, OTR/L 08/30/2020, 3:23 PM  [facility] [address] [place], [place], [address] Phone: [phone]   Fax:  [phone]  Name: Shane Espinoza MRN: [id number] Date of Birth: July 28, 1940

## 2020-08-30 NOTE — Therapy (Signed)
[facility] [address] [place], [place], [address] Phone: [phone]   Fax:  [phone]  Physical Therapy Evaluation  Patient Details  Name: LORETO LOESCHER MRN: [id number] Date of Birth: [birth year] Referring Provider (PT): Letta Pate, Luanna Salk, MD (being followed by)   Encounter Date: 08/30/2020   PT End of Session - 08/30/20 1525    Visit Number 1    Number of Visits 17    Date for PT Re-Evaluation 11/28/20   written for 60 day POC   Authorization Type Aetna Medicare    PT Start Time 1403    PT Stop Time 1447    PT Time Calculation (min) 44 min    Equipment Utilized During Treatment Gait belt    Activity Tolerance Patient tolerated treatment well    Behavior During Therapy [facility] for tasks assessed/performed           Past Medical History:  Diagnosis Date  . Anxiety   . Atrial fibrillation ([place])   . Biceps tendon tear    right  . CAD (coronary artery disease)    [name]  . Gout   . Hypertension   . 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 CARDIAC CATHETERIZATION  03/02/2011   Recommended CABG  . COLON SURGERY    . CORONARY ARTERY BYPASS GRAFT  03/07/2011   DR.VAN  TRIGHT  . EYE SURGERY    . LEXISCAN MYOVIEW  02/20/2012   EKG negative for ischemia, noraml study  . TRANSTHORACIC ECHOCARDIOGRAM  02/20/2012   EF 50-55%, mild-moderate concentric LVH, LA moderately dilated, moderate mitral regurg  . VASCULAR DOPPLER  03/03/2011   Nosignificant extracranial carotid artery stenosis    There were no vitals filed for this visit.    Subjective Assessment - 08/30/20 1406    Subjective Had a fall 07/08/20 with nondisplaced right lateral malleolus and adjacent avulsion fracture of medial malleolus who developed left sided weakness and numbness with fall.  Feb 80rd - patient was walking with his walker and then noticed acute left arm weakness, shaking  and some leg weakness.MRI brain showed right sided acute subcortical CVA. Was hospitalized 08/05/20 - 08/26/20. Received inpatient rehab. Pt using R camboot OOB and RLE is WBAT. Reports R knee at times will feel like it will give out when walking. Thinking he might need a knee brace when walking. Doing exercises currently from inpatient rehab.    Patient is accompained by: Family member   wife [name]   Pertinent History CAF, CAD s/p CABG 2012, CVA 2015, A fib- on Xarelto, Severe tricompartmental Right knee OA    Limitations Standing;Walking    How long can you walk comfortably? has been walking 20' at home with RW    Patient Stated Goals wants to be able to walk by himself.    Currently in Pain? No/denies              [facility] PT Assessment - 08/30/20 1413      Assessment   Medical Diagnosis Rt CVA   R ankle fx 07/08/20   Referring Provider (PT) Charlett Blake, MD   being followed by   Onset Date/Surgical Date 08/05/20    Hand Dominance Right    Prior Therapy CIR      Precautions   Precautions Other (comment);Fall    Precaution Comments CAM boot RLE - WBAT      Restrictions  Weight Bearing Restrictions Yes    RLE Weight Bearing Weight bearing as tolerated      Balance Screen   Has the patient fallen in the past 6 months Yes    How many times? 1    Has the patient had a decrease in activity level because of a fear of falling?  Yes   much more cautious   Is the patient reluctant to leave their home because of a fear of falling?  No      Home Environment   Living Environment Private residence    Living Arrangements Spouse/significant other    Type of [place] to enter    Entrance Stairs-Number of Steps 3    Entrance Stairs-Rails Right    Home Layout Two level    [place] bars - toilet;Grab bars - tub/shower;Shower seat;Walker - 2 wheels;Other (comment);Cane - single point;Bedside commode;Transport chair    Additional Comments has a stair lift  to go up to 2nd floor      Prior Function   Level of Independence Independent   prior to jan 80   Vocation Retired    Leisure reading, watching TV      Observation/Other Assessments   Observations mild swelling R knee    Focus on Therapeutic Outcomes (FOTO)  50      Sensation   Light Touch Appears Intact      Coordination   Gross Motor Movements are Fluid and Coordinated No    Heel Shin Test Decreased coordination R LE due to CAM boot, slower to perform with LLE      Posture/Postural Control   Posture/Postural Control Postural limitations    Postural Limitations Posterior pelvic tilt;Rounded Shoulders      ROM / Strength   AROM / PROM / Strength Strength;AROM      AROM   Overall AROM Comments limited knee extension B in sitting when performing MMT      Strength   Right Hip Flexion 3+/5    Right Knee Flexion 4-/5    Right Knee Extension 3+/5    Left Knee Flexion 4-/5    Left Knee Extension 4-/5    Right Ankle Dorsiflexion --   unable to perform due to ankle fx   Left Ankle Dorsiflexion 4-/5   unable to perform due to ankle fx     Transfers   Transfers Sit to Stand;Stand to Sit    Sit to Stand 4: Min guard    Sit to Stand Details Verbal cues for technique;Verbal cues for sequencing    Sit to Stand Details (indicate cue type and reason) cues for proper UE placement and scooting towards edge, pt with a couple episodes of bracing against mat table, takes a couple seconds for erect posture in standing    Five time sit to stand comments  40.13 seconds from mat table    Stand to Sit 4: Min guard   cues to reach back posteriorly to mat table for controlled descent     Ambulation/Gait   Ambulation/Gait Yes    Ambulation/Gait Assistance 4: Min guard    Ambulation Distance (Feet) 30 Feet   x2   Assistive device Rolling walker    Gait Pattern Decreased step length - left;Decreased stance time - right;Decreased weight shift to right;Step-through pattern;Step-to pattern     Ambulation Surface Indoor;Level    Gait Comments pt could've walked further distance today - limited due to time constraints  Standardized Balance Assessment   Standardized Balance Assessment Timed Up and Go Test      Timed Up and Go Test   Normal TUG (seconds) 47.06    TUG Comments with RW                      Objective measurements completed on examination: See above findings.               PT Education - 08/30/20 1509    Education Details clinical findings, POC    Person(s) Educated Patient;Spouse    Methods Explanation    Comprehension Verbalized understanding            PT Short Term Goals - 08/30/20 1626      PT SHORT TERM GOAL #1   Title Pt will perform 5x sit <> stand in 32 seconds or less with UE support  in order to demo improved functional mobility. ALL STGS DUE 09/27/20    Baseline 40.13 seconds    Time 4    Period Weeks    Status New    Target Date 09/27/20      PT SHORT TERM GOAL #2   Title Pt will undergo BERG with STG and LTG written as appropriate.    Baseline not yet assessed.    Time 4    Period Weeks    Status New      PT SHORT TERM GOAL #3   Title Pt will undergo assessment of gait speed with with STG and LTG written as appropriate.    Time 4    Period Weeks    Status New      PT SHORT TERM GOAL #4   Title Pt will ambulate at least 3' with RW with supervision in order to demo improved household mobility.    Time 4    Period Weeks    Status New      PT SHORT TERM GOAL #5   Title Pt will perform TUG in 38 seconds or less with RW in order to demo decr fall risk.    Baseline 47.06 with RW    Time 4    Period Weeks    Status New             PT Long Term Goals - 08/30/20 1629      PT LONG TERM GOAL #1   Title Pt will be independent with final HEP in order to build upon functional gains made in therapy. ALL LTGS DUE 10/25/20    Time 8    Period Weeks    Status New    Target Date 10/25/20      PT LONG  TERM GOAL #2   Title BERG goal to be written as appropriate.    Time 8    Period Weeks    Status New      PT LONG TERM GOAL #3   Title Pt will ambulate at least 300' with LRAD with supervision over unlevel surfaces in order to demo improved community mobility.    Time 8    Period Weeks    Status New      PT LONG TERM GOAL #4   Title Pt will perform TUG in 28 seconds or less with RW vs. LRAD  in order to demo decr fall risk.    Baseline 47.06 with RW    Time 8    Period Weeks    Status New  PT LONG TERM GOAL #5   Title Gait speed goal written as appropriate in order to demo decr fall risk.    Time 8    Period Weeks    Status New      Additional Long Term Goals   Additional Long Term Goals Yes      PT LONG TERM GOAL #6   Title Pt will perform 5x sit <> stand in 25 seconds or less with UE support in order to demo improved functional mobility.    Baseline 40.13 seconds    Time 8    Period Weeks    Status New      PT LONG TERM GOAL #7   Title Pt will improve FOTO score to a 64 in order to demo improved function.    Baseline 55    Time 8    Period Weeks    Status New                  Plan - 08/30/20 1632    Clinical Impression Statement Patient is a 80 y.o. year old male with history of CAF, CAD s/p CABG 2012, CVA 2015, A fib- on Xarelto,  fall 07/08/20 with nondisplaced right lateral malleolus and adjacent avulsion fracture of medial malleolus.  The patient was walking with his walker and then noticed acute left arm weakness, shaking and some leg weakness.MRI brain showed right sided acute subcortical CVA on 08/05/20. Pt received inpatient rehab and was discharged home from hospital on 08/26/20. The following deficits were present during the exam:  impaired strength, impaired balance, difficulties with functional transfers, gait abnormalities, decr knee extension ROM, postural abnormalities. Pt in CAM boot on RLE and is WBAT. Based on TUG and 5x sit <> stand, pt is at  an incr risk for falls. . Pt would benefit from skilled PT to address these impairments and functional limitations to maximize functional mobility independence    Personal Factors and Comorbidities Comorbidity 3+;Past/Current Experience    Comorbidities CAF, CAD s/p CABG 2012, CVA 2015, A fib- on Xarelto, R ankle fx    Examination-Activity Limitations Stairs;Stand;Transfers;Squat;Locomotion Level;Carry    Examination-Participation Restrictions Community Activity;Cleaning    Stability/Clinical Decision Making Evolving/Moderate complexity    Clinical Decision Making Moderate    Rehab Potential Good    PT Frequency 2x / week    PT Duration 8 weeks    PT Treatment/Interventions ADLs/Self Care Home Management;Gait training;DME Instruction;Functional mobility training;Therapeutic activities;Neuromuscular re-education;Balance training;Stair training;Therapeutic exercise;Patient/family education;Passive range of motion;Vestibular;Manual techniques    PT Next Visit Plan review previous HEP from hospital inpatient rehab and adjust/modify as needed. perform BERG and gait speed with RW with goal written. sit <> stand transfers.    Consulted and Agree with Plan of Care Patient;Family member/caregiver    Family Member Consulted wife susan           Patient will benefit from skilled therapeutic intervention in order to improve the following deficits and impairments:  Abnormal gait,Decreased balance,Decreased activity tolerance,Decreased coordination,Decreased knowledge of use of DME,Decreased range of motion,Difficulty walking,Decreased strength,Hypomobility,Postural dysfunction  Visit Diagnosis: Unsteadiness on feet  Other abnormalities of gait and mobility  Muscle weakness (generalized)  History of falling     Problem List Patient Active Problem List   Diagnosis Date Noted  . Stroke (cerebrum) ([place]) 08/11/2020  . Acute CVA (cerebrovascular accident) ([place]) 08/06/2020  . TIA (transient  ischemic attack) 08/05/2020  . Gout 12/07/2013  . Atrial fibrillation ([place]) 12/07/2013  . Acute thrombotic stroke (  [place]) 09/23/2013  . CVA (cerebral infarction) 09/23/2013  . HTN (hypertension) 04/16/2013  . Hyperlipidemia with target LDL less than 70 04/16/2013  . CAD (coronary artery disease)   . Anxiety   . Hypertension   . S/P CABG x 4     Arliss Journey, PT, DPT  08/30/2020, 4:39 PM  [facility] [address] [place], [place], [address] Phone: [phone]   Fax:  [phone]  Name: NAYQUAN EVINGER MRN: [id number] Date of Birth: 1941/04/12

## 2020-08-31 DIAGNOSIS — I4891 Unspecified atrial fibrillation: Secondary | ICD-10-CM | POA: Diagnosis not present

## 2020-08-31 DIAGNOSIS — I10 Essential (primary) hypertension: Secondary | ICD-10-CM | POA: Diagnosis not present

## 2020-08-31 DIAGNOSIS — S82841D Displaced bimalleolar fracture of right lower leg, subsequent encounter for closed fracture with routine healing: Secondary | ICD-10-CM | POA: Diagnosis not present

## 2020-08-31 DIAGNOSIS — I2510 Atherosclerotic heart disease of native coronary artery without angina pectoris: Secondary | ICD-10-CM | POA: Diagnosis not present

## 2020-08-31 DIAGNOSIS — I6350 Cerebral infarction due to unspecified occlusion or stenosis of unspecified cerebral artery: Secondary | ICD-10-CM | POA: Diagnosis not present

## 2020-08-31 DIAGNOSIS — E782 Mixed hyperlipidemia: Secondary | ICD-10-CM | POA: Diagnosis not present

## 2020-09-06 ENCOUNTER — Other Ambulatory Visit: Payer: Self-pay

## 2020-09-06 ENCOUNTER — Ambulatory Visit: Payer: Medicare HMO | Admitting: Occupational Therapy

## 2020-09-06 ENCOUNTER — Ambulatory Visit: Payer: Medicare HMO | Attending: Physician Assistant

## 2020-09-06 DIAGNOSIS — R2689 Other abnormalities of gait and mobility: Secondary | ICD-10-CM

## 2020-09-06 DIAGNOSIS — R2681 Unsteadiness on feet: Secondary | ICD-10-CM

## 2020-09-06 DIAGNOSIS — Z9181 History of falling: Secondary | ICD-10-CM | POA: Diagnosis not present

## 2020-09-06 DIAGNOSIS — M6281 Muscle weakness (generalized): Secondary | ICD-10-CM | POA: Diagnosis not present

## 2020-09-06 DIAGNOSIS — R278 Other lack of coordination: Secondary | ICD-10-CM | POA: Insufficient documentation

## 2020-09-06 NOTE — Therapy (Signed)
[facility] [address] [place], [place], [address] Phone: [phone]   Fax:  [phone]  Physical Therapy Treatment  Patient Details  Name: Shane Espinoza MRN: [id number] Date of Birth: 1941-05-24 Referring Provider (PT): Letta Pate, Luanna Salk, MD (being followed by)   Encounter Date: 09/06/2020   PT End of Session - 09/06/20 1359    Visit Number 2    Number of Visits 17    Date for PT Re-Evaluation 11/28/20   written for 60 day POC   Authorization Type Aetna Medicare    PT Start Time 1402    PT Stop Time 1445    PT Time Calculation (min) 43 min    Equipment Utilized During Treatment Gait belt    Activity Tolerance Patient tolerated treatment well    Behavior During Therapy [facility] for tasks assessed/performed           Past Medical History:  Diagnosis Date  . Anxiety   . Atrial fibrillation ([place])   . Biceps tendon tear    right  . CAD (coronary artery disease)    [name]  . Gout   . Hypertension   . 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 CARDIAC CATHETERIZATION  03/02/2011   Recommended CABG  . COLON SURGERY    . CORONARY ARTERY BYPASS GRAFT  03/07/2011   DR.VAN  TRIGHT  . EYE SURGERY    . LEXISCAN MYOVIEW  02/20/2012   EKG negative for ischemia, noraml study  . TRANSTHORACIC ECHOCARDIOGRAM  02/20/2012   EF 50-55%, mild-moderate concentric LVH, LA moderately dilated, moderate mitral regurg  . VASCULAR DOPPLER  03/03/2011   Nosignificant extracranial carotid artery stenosis    There were no vitals filed for this visit.   Subjective Assessment - 09/06/20 1359    Subjective No new changes. Brought in note from MD regarding ankle, WBAT and will follow up in 4 weeks. Will most likely have more imaging done to determine how it is healing. No pain today.    Patient is accompained by: Family member   wife Shane Espinoza   Pertinent History CAF, CAD s/p  CABG 2012, CVA 2015, A fib- on Xarelto, Severe tricompartmental Right knee OA    Limitations Standing;Walking    How long can you walk comfortably? has been walking 20' at home with RW    Patient Stated Goals wants to be able to walk by himself.    Currently in Pain? No/denies                     [facility] Adult PT Treatment/Exercise - 09/06/20 0001      Transfers   Transfers Sit to Stand;Stand to Sit    Sit to Stand 4: Min guard    Sit to Stand Details Verbal cues for technique;Verbal cues for sequencing    Stand to Sit 4: Min guard      Ambulation/Gait   Ambulation/Gait Yes    Ambulation/Gait Assistance 4: Min guard    Assistive device Rolling walker    Gait Pattern Decreased step length - left;Decreased stance time - right;Decreased weight shift to right;Step-through pattern;Step-to pattern    Ambulation Surface Level;Indoor    Gait Comments patient ambulating with RW onto scale (see self care section)      Self-Care   Self-Care Other Self-Care Comments    Other Self-Care Comments  Patient and wife requesting patient  to be weighed. PT ambulating with patient onto scale with RW, and subtracting weight of CAM Boot and RW with patient currently weighing 205 lbs. Patient's wife concerned of swelling in BLE, PT assesed area with piting edema noted. PT educating on increased swelling due to relative immobility, decreased muscle pump action with R ankle in CAM boot. PT educating on continued monitoring and importance/schedule of elevation. If swelling does not change, will reach out to MD regarding potential for return to wearing compression stocking (patient had been wearing prior but stopped due to ankle fx)      Exercises   Exercises Other Exercises    Other Exercises  completed review and update of HEP provided at inpatient rehab facility. See Below for details.          Completed all of the following exercises and updated HEP to patient's tolerance. Pt requiring intermittent  verbal cues for proper form/technique. PT educating to complete at least 1x/daily, but patient reports being motivated and wanting to complete 2-3x/daily. PT educating on importance of monitoring fatigue (specifically) in ankle, and importance of adequate rest as well. Patient/spouse verbalized understanding.     Access Code: [email or web address] URL: [email or web address] Date: 09/06/2020 Prepared by: Baldomero Lamy  Exercises Supine Bridge - 1 x daily - 7 x weekly - 3 sets - 10 reps Clamshell - 1 x daily - 7 x weekly - 3 sets - 10 reps Sit to Stand - 1 x daily - 7 x weekly - 3 sets - 10 reps Seated Long Arc Quad - 1 x daily - 7 x weekly - 3 sets - 10 reps Seated March with Resistance - 1 x daily - 7 x weekly - 3 sets - 10 reps - red theraband Seated Ankle Alphabet - 1 x daily - 7 x weekly - 3 sets - 10 reps Seated Ankle Dorsiflexion with Anchored Resistance - 1 x daily - 7 x weekly - 2 sets - 10 reps - red theraband Seated Ankle Plantar Flexion with Resistance Loop - 1 x daily - 7 x weekly - 2 sets - 10 reps - red theraband        PT Education - 09/06/20 1622    Education Details Review/Update HEP    Person(s) Educated Patient;Spouse    Methods Explanation;Demonstration;Handout    Comprehension Verbalized understanding;Returned demonstration;Verbal cues required            PT Short Term Goals - 08/30/20 1626      PT SHORT TERM GOAL #1   Title Pt will perform 5x sit <> stand in 32 seconds or less with UE support  in order to demo improved functional mobility. ALL STGS DUE 09/27/20    Baseline 40.13 seconds    Time 4    Period Weeks    Status New    Target Date 09/27/20      PT SHORT TERM GOAL #2   Title Pt will undergo BERG with STG and LTG written as appropriate.    Baseline not yet assessed.    Time 4    Period Weeks    Status New      PT SHORT TERM GOAL #3   Title Pt will undergo assessment of gait speed with with STG and LTG written as appropriate.    Time 4     Period Weeks    Status New      PT SHORT TERM GOAL #4   Title Pt will ambulate at least 17' with RW with supervision  in order to demo improved household mobility.    Time 4    Period Weeks    Status New      PT SHORT TERM GOAL #5   Title Pt will perform TUG in 38 seconds or less with RW in order to demo decr fall risk.    Baseline 47.06 with RW    Time 4    Period Weeks    Status New             PT Long Term Goals - 08/30/20 1629      PT LONG TERM GOAL #1   Title Pt will be independent with final HEP in order to build upon functional gains made in therapy. ALL LTGS DUE 10/25/20    Time 8    Period Weeks    Status New    Target Date 10/25/20      PT LONG TERM GOAL #2   Title BERG goal to be written as appropriate.    Time 8    Period Weeks    Status New      PT LONG TERM GOAL #3   Title Pt will ambulate at least 300' with LRAD with supervision over unlevel surfaces in order to demo improved community mobility.    Time 8    Period Weeks    Status New      PT LONG TERM GOAL #4   Title Pt will perform TUG in 28 seconds or less with RW vs. LRAD  in order to demo decr fall risk.    Baseline 47.06 with RW    Time 8    Period Weeks    Status New      PT LONG TERM GOAL #5   Title Gait speed goal written as appropriate in order to demo decr fall risk.    Time 8    Period Weeks    Status New      Additional Long Term Goals   Additional Long Term Goals Yes      PT LONG TERM GOAL #6   Title Pt will perform 5x sit <> stand in 25 seconds or less with UE support in order to demo improved functional mobility.    Baseline 40.13 seconds    Time 8    Period Weeks    Status New      PT LONG TERM GOAL #7   Title Pt will improve FOTO score to a 64 in order to demo improved function.    Baseline 55    Time 8    Period Weeks    Status New                 Plan - 09/06/20 1400    Clinical Impression Statement Today's skilled PT session complete review of HEP  provided at inpatient rehab and updated to patient's tolerance, paitent tolerating resistance to ankle well. PT also weighing patient at patient/spouses request, as well as educating on importance of elevation of BLE due to swelling. Will continue to progress toward all goals.    Personal Factors and Comorbidities Comorbidity 3+;Past/Current Experience    Comorbidities CAF, CAD s/p CABG 2012, CVA 2015, A fib- on Xarelto, R ankle fx    Examination-Activity Limitations Stairs;Stand;Transfers;Squat;Locomotion Level;Carry    Examination-Participation Restrictions Community Activity;Cleaning    Stability/Clinical Decision Making Evolving/Moderate complexity    Rehab Potential Good    PT Frequency 2x / week    PT Duration 8 weeks    PT  Treatment/Interventions ADLs/Self Care Home Management;Gait training;DME Instruction;Functional mobility training;Therapeutic activities;Neuromuscular re-education;Balance training;Stair training;Therapeutic exercise;Patient/family education;Passive range of motion;Vestibular;Manual techniques    PT Next Visit Plan How was HEP update? assess gait speed and Berg Balance (update goals)    Consulted and Agree with Plan of Care Patient;Family member/caregiver    Family Member Consulted wife susan           Patient will benefit from skilled therapeutic intervention in order to improve the following deficits and impairments:  Abnormal gait,Decreased balance,Decreased activity tolerance,Decreased coordination,Decreased knowledge of use of DME,Decreased range of motion,Difficulty walking,Decreased strength,Hypomobility,Postural dysfunction  Visit Diagnosis: Unsteadiness on feet  Muscle weakness (generalized)  Other abnormalities of gait and mobility     Problem List Patient Active Problem List   Diagnosis Date Noted  . Diabetes ([place]) 08/30/2020  . Ankle fracture, right 08/30/2020  . Knee pain 08/30/2020  . 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 Acute CVA  (cerebrovascular accident) ([place]) 08/06/2020  . TIA (transient ischemic attack) 08/05/2020  . Gout 12/07/2013  . Atrial fibrillation ([place]) 12/07/2013  . Acute thrombotic stroke ([place]) 09/23/2013  . CVA (cerebral infarction) 09/23/2013  . Hyperlipidemia with target LDL less than 70 04/16/2013  . CAD (coronary artery disease)   . Anxiety   . Hypertension   . S/P CABG x 4     Jones Bales, PT, DPT 09/06/2020, 4:26 PM  [facility] [address] [place], [place], [address] Phone: [phone]   Fax:  [phone]  Name: ROCK SOBOL MRN: [id number] Date of Birth: Oct 07, 1940

## 2020-09-06 NOTE — Patient Instructions (Signed)
  Coordination Activities  Perform the following activities for 10 minutes 1 times per day with left hand(s).   Rotate ball in fingertips (clockwise and counter-clockwise).  Flip cards 1 at a time as fast as you can.  Deal cards with your thumb (Hold deck in hand and push card off top with thumb).  Rotate 1 card in hand (clockwise and counter-clockwise).  Pick up coins one at a time until you get 5 in your hand, then move coins from palm to fingertips to stack one at a time.

## 2020-09-06 NOTE — Therapy (Signed)
[facility] [address] [place], [place], [address] Phone: [phone]   Fax:  [phone]  Occupational Therapy Treatment  Patient Details  Name: Shane Espinoza MRN: [id number] Date of Birth: 1941/05/23 Referring Provider (OT): Dr. Letta Pate   Encounter Date: 09/06/2020   OT End of Session - 09/06/20 1501    Visit Number 2    Number of Visits 9    Date for OT Re-Evaluation 09/27/20    Authorization Type Aetna MCR - [place]: [place], [place]35 cop[place] per day    OT Start Time 1315    OT Stop Time 1400    OT Time Calculation (min) 45 min    Activity Tolerance Patient tolerated treatment well    Behavior During Therapy [facility] for tasks assessed/performed           Past Medical History:  Diagnosis Date  . Anxiety   . Atrial fibrillation ([place])   . Biceps tendon tear    right  . CAD (coronary artery disease)    [name]  . Gout   . Hypertension   . 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 CARDIAC CATHETERIZATION  03/02/2011   Recommended CABG  . COLON SURGERY    . CORONARY ARTERY BYPASS GRAFT  03/07/2011   DR.VAN  TRIGHT  . EYE SURGERY    . LEXISCAN MYOVIEW  02/20/2012   EKG negative for ischemia, noraml study  . TRANSTHORACIC ECHOCARDIOGRAM  02/20/2012   EF 50-55%, mild-moderate concentric LVH, LA moderately dilated, moderate mitral regurg  . VASCULAR DOPPLER  03/03/2011   Nosignificant extracranial carotid artery stenosis    There were no vitals filed for this visit.   Subjective Assessment - 09/06/20 1315    Subjective  My Rt knee gives me trouble; that's why I think I fell. (wife reports trouble with popliteal fossa)    Patient is accompanied by: Family member   wife   Pertinent History Rt CVA 08/05/20, ankle fx 07/07/20 from fall. PMH: CAF, CAD s/p CABG 2012, CVA 2015 (completed resolved), HTN, A-fib,    Limitations CAM boot, WBAT, No driving, no heavy lifting  (old Rt biceps rupture)    Currently in Pain? No/denies             Pt/wife brought in HEP's from CIR - UE strengthening HEP remains appropriate, therefore no updates needed. However, did update coordination HEP - see pt instructions for details. Pt demo each.  Worked on sit to stand and Designer, fashion/clothing w/ this. Also simulated LE dressing and clothes management w/ toileting (using theraband). Pt required close supervision w/ sit to stand and clothes management in standing but no assist needed.                    OT Education - 09/06/20 1338    Education Details updated coordination HEP, verbal review of theraband HEP issued while at [facility]    Person(s) Educated Patient;Spouse    Methods Explanation;Demonstration;Handout    Comprehension Verbalized understanding;Returned demonstration               OT Long Term Goals - 09/06/20 1501      OT LONG TERM GOAL #1   Title Independent with UB strengthening HEP and Lt hand coordination HEP    Time 4    Period Weeks    Status Achieved      OT LONG TERM  GOAL #2   Title Pt to perform toilet and shower transfers consistently w/ supervision only    Time 4    Period Weeks    Status New      OT LONG TERM GOAL #3   Title Pt to stand to perform clothes management after toileting mod I level    Time 4    Period Weeks    Status On-going      OT LONG TERM GOAL #4   Title Pt to improve coordination Lt hand as evidenced by performing 9 hole peg test in 35 sec or less    Baseline 39.94 sec    Time 4    Period Weeks    Status New      OT LONG TERM GOAL #5   Title Pt to perform dynamic standing task w/ countertop support prn for 10 min w/o rest    Time 4    Period Weeks    Status New                 Plan - 09/06/20 1501    Clinical Impression Statement Pt progressing towards goals and increased independence w/ sit to stand and standing balance for clothes management.    OT Occupational Profile and History  Problem Focused Assessment - Including review of records relating to presenting problem    Occupational performance deficits (Please refer to evaluation for details): ADL's;IADL's;Social Participation    Body Structure / Function / Physical Skills Balance;Strength;IADL;Mobility;Decreased knowledge of precautions;FMC;Coordination;ADL    Rehab Potential Good    Clinical Decision Making Several treatment options, min-mod task modification necessary    Comorbidities Affecting Occupational Performance: Presence of comorbidities impacting occupational performance    Comorbidities impacting occupational performance description: Rt ankle fx    Modification or Assistance to Complete Evaluation  No modification of tasks or assist necessary to complete eval    OT Frequency 2x / week    OT Duration 4 weeks   plus eval   OT Treatment/Interventions Self-care/ADL training;DME and/or AE instruction;Therapeutic activities;Therapeutic exercise;Coping strategies training;Functional Mobility Training;Neuromuscular education;Passive range of motion;Patient/family education;Energy conservation    Plan dynamic standing/standing tolerance    Consulted and Agree with Plan of Care Patient;Family member/caregiver    Family Member Consulted wife           Patient will benefit from skilled therapeutic intervention in order to improve the following deficits and impairments:   Body Structure / Function / Physical Skills: Balance,Strength,IADL,Mobility,Decreased knowledge of precautions,FMC,Coordination,ADL       Visit Diagnosis: Other lack of coordination  Muscle weakness (generalized)  Unsteadiness on feet    Problem List Patient Active Problem List   Diagnosis Date Noted  . Diabetes ([place]) 08/30/2020  . Ankle fracture, right 08/30/2020  . Knee pain 08/30/2020  . Stroke (cerebrum) ([place]) 08/11/2020  . Acute CVA (cerebrovascular accident) ([place]) 08/06/2020  . TIA (transient ischemic attack) 08/05/2020  .  Gout 12/07/2013  . Atrial fibrillation ([place]) 12/07/2013  . Acute thrombotic stroke ([place]) 09/23/2013  . CVA (cerebral infarction) 09/23/2013  . Hyperlipidemia with target LDL less than 70 04/16/2013  . CAD (coronary artery disease)   . Anxiety   . Hypertension   . S/P CABG x 4     Carey Bullocks, OTR/L 09/06/2020, 3:03 PM  [facility] [address] [place], [place], [address] Phone: [phone]   Fax:  [phone]  Name: Shane Espinoza MRN: [id number] Date of Birth: 1940/11/17

## 2020-09-06 NOTE — Patient Instructions (Signed)
Access Code: [email or web address] URL: [email or web address] Date: 09/06/2020 Prepared by: Baldomero Lamy  Exercises Supine Bridge - 1 x daily - 7 x weekly - 3 sets - 10 reps Clamshell - 1 x daily - 7 x weekly - 3 sets - 10 reps Sit to Stand - 1 x daily - 7 x weekly - 3 sets - 10 reps Seated Long Arc Quad - 1 x daily - 7 x weekly - 3 sets - 10 reps Seated March with Resistance - 1 x daily - 7 x weekly - 3 sets - 10 reps Seated Ankle Alphabet - 1 x daily - 7 x weekly - 3 sets - 10 reps Seated Ankle Dorsiflexion with Anchored Resistance - 1 x daily - 7 x weekly - 2 sets - 10 reps Seated Ankle Plantar Flexion with Resistance Loop - 1 x daily - 7 x weekly - 2 sets - 10 reps

## 2020-09-08 ENCOUNTER — Encounter: Payer: Medicare HMO | Admitting: Occupational Therapy

## 2020-09-08 ENCOUNTER — Ambulatory Visit: Payer: Medicare HMO

## 2020-09-09 ENCOUNTER — Ambulatory Visit: Payer: Medicare HMO | Admitting: Occupational Therapy

## 2020-09-09 ENCOUNTER — Other Ambulatory Visit: Payer: Self-pay

## 2020-09-09 ENCOUNTER — Encounter: Payer: Self-pay | Admitting: Occupational Therapy

## 2020-09-09 ENCOUNTER — Encounter: Payer: Self-pay | Admitting: Physical Therapy

## 2020-09-09 ENCOUNTER — Ambulatory Visit: Payer: Medicare HMO | Admitting: Physical Therapy

## 2020-09-09 DIAGNOSIS — R2689 Other abnormalities of gait and mobility: Secondary | ICD-10-CM | POA: Diagnosis not present

## 2020-09-09 DIAGNOSIS — M6281 Muscle weakness (generalized): Secondary | ICD-10-CM

## 2020-09-09 DIAGNOSIS — Z9181 History of falling: Secondary | ICD-10-CM

## 2020-09-09 DIAGNOSIS — R278 Other lack of coordination: Secondary | ICD-10-CM

## 2020-09-09 DIAGNOSIS — R2681 Unsteadiness on feet: Secondary | ICD-10-CM

## 2020-09-09 NOTE — Therapy (Signed)
[facility] [address] [place], [place], [address] Phone: [phone]   Fax:  [phone]  Occupational Therapy Treatment  Patient Details  Name: Shane Espinoza MRN: [id number] Date of Birth: May 06, 1941 Referring Provider (OT): Dr. Letta Pate   Encounter Date: 09/09/2020   OT End of Session - 09/09/20 1534    Visit Number 3    Number of Visits 9    Date for OT Re-Evaluation 09/27/20    Authorization Type Aetna MCR - [place], [place]35 cop[place] per day    OT Start Time 1534    OT Stop Time 1615    OT Time Calculation (min) 41 min    Activity Tolerance Patient tolerated treatment well    Behavior During Therapy [facility] for tasks assessed/performed           Past Medical History:  Diagnosis Date  . Anxiety   . Atrial fibrillation ([place])   . Biceps tendon tear    right  . CAD (coronary artery disease)    [name]  . Gout   . Hypertension   . 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 CARDIAC CATHETERIZATION  03/02/2011   Recommended CABG  . COLON SURGERY    . CORONARY ARTERY BYPASS GRAFT  03/07/2011   DR.VAN  TRIGHT  . EYE SURGERY    . LEXISCAN MYOVIEW  02/20/2012   EKG negative for ischemia, noraml study  . TRANSTHORACIC ECHOCARDIOGRAM  02/20/2012   EF 50-55%, mild-moderate concentric LVH, LA moderately dilated, moderate mitral regurg  . VASCULAR DOPPLER  03/03/2011   Nosignificant extracranial carotid artery stenosis    There were no vitals filed for this visit.   Subjective Assessment - 09/09/20 1534    Subjective  Pt denies any pain today.    Patient is accompanied by: Family member   wife   Pertinent History Rt CVA 08/05/20, ankle fx 07/07/20 from fall. PMH: CAF, CAD s/p CABG 2012, CVA 2015 (completed resolved), HTN, A-fib,    Limitations CAM boot, WBAT, No driving, no heavy lifting (old Rt biceps rupture)    Currently in Pain? No/denies              Treatment:  Standing tolerance with standing to place pegs into semicircle peg board with LUE - pt tolerated static standing for approximately 8 minutes.   Sit to stands x 6 with RW with SBA for placing resistance clothespins on elevated surface. Pt with LOB x 1 when trying to catch pegboard when sliding down mirror. Regained balance with min A from therapist and returned to seating.    Placed resistance clothespins 1-8# with LUE while seated.   Flipping cards with LUE, pennies with LUE - picking up, stacking and placing in piggy bank with in hand manipulation.  In hand manipulation w golf balls - in LUE with increased effort but was able to complete 5 + revolutions                 OT Long Term Goals - 09/06/20 1501      OT LONG TERM GOAL #1   Title Independent with UB strengthening HEP and Lt hand coordination HEP    Time 4    Period Weeks    Status Achieved      OT LONG TERM GOAL #2   Title Pt to perform toilet and shower transfers consistently w/ supervision only  Time 4    Period Weeks    Status New      OT LONG TERM GOAL #3   Title Pt to stand to perform clothes management after toileting mod I level    Time 4    Period Weeks    Status On-going      OT LONG TERM GOAL #4   Title Pt to improve coordination Lt hand as evidenced by performing 9 hole peg test in 35 sec or less    Baseline 39.94 sec    Time 4    Period Weeks    Status New      OT LONG TERM GOAL #5   Title Pt to perform dynamic standing task w/ countertop support prn for 10 min w/o rest    Time 4    Period Weeks    Status New                 Plan - 09/09/20 1722    Clinical Impression Statement Pt is progressing towards goals - pt continues to benefit from therapy to target standing balance for ADLs and IADLs.    OT Occupational Profile and History Problem Focused Assessment - Including review of records relating to presenting problem    Occupational performance  deficits (Please refer to evaluation for details): ADL's;IADL's;Social Participation    Body Structure / Function / Physical Skills Balance;Strength;IADL;Mobility;Decreased knowledge of precautions;FMC;Coordination;ADL    Rehab Potential Good    Clinical Decision Making Several treatment options, min-mod task modification necessary    Comorbidities Affecting Occupational Performance: Presence of comorbidities impacting occupational performance    Comorbidities impacting occupational performance description: Rt ankle fx    Modification or Assistance to Complete Evaluation  No modification of tasks or assist necessary to complete eval    OT Frequency 2x / week    OT Duration 4 weeks   plus eval   OT Treatment/Interventions Self-care/ADL training;DME and/or AE instruction;Therapeutic activities;Therapeutic exercise;Coping strategies training;Functional Mobility Training;Neuromuscular education;Passive range of motion;Patient/family education;Energy conservation    Plan dynamic standing/standing tolerance    Consulted and Agree with Plan of Care Patient;Family member/caregiver    Family Member Consulted wife           Patient will benefit from skilled therapeutic intervention in order to improve the following deficits and impairments:   Body Structure / Function / Physical Skills: Balance,Strength,IADL,Mobility,Decreased knowledge of precautions,FMC,Coordination,ADL       Visit Diagnosis: Other lack of coordination  Muscle weakness (generalized)  Unsteadiness on feet    Problem List Patient Active Problem List   Diagnosis Date Noted  . Diabetes ([place]) 08/30/2020  . Ankle fracture, right 08/30/2020  . Knee pain 08/30/2020  . Stroke (cerebrum) ([place]) 08/11/2020  . Acute CVA (cerebrovascular accident) ([place]) 08/06/2020  . TIA (transient ischemic attack) 08/05/2020  . Gout 12/07/2013  . Atrial fibrillation ([place]) 12/07/2013  . Acute thrombotic stroke ([place]) 09/23/2013  . CVA (cerebral  infarction) 09/23/2013  . Hyperlipidemia with target LDL less than 70 04/16/2013  . CAD (coronary artery disease)   . Anxiety   . Hypertension   . S/P CABG x 4     Zachery Conch [place], OTR/L  09/09/2020, 5:23 PM  [facility] [address] [place], [place], [address] Phone: [phone]   Fax:  [phone]  Name: Shane Espinoza MRN: [id number] Date of Birth: 08/28/1940

## 2020-09-10 NOTE — Therapy (Signed)
[facility] [address] [place], [place], [address] Phone: [phone]   Fax:  [phone]  Physical Therapy Treatment  Patient Details  Name: Shane Espinoza MRN: [id number] Date of Birth: 1940-07-21 Referring Provider (PT): Letta Pate, Luanna Salk, MD (being followed by)   Encounter Date: 09/09/2020   PT End of Session - 09/09/20 1406    Visit Number 3    Number of Visits 17    Date for PT Re-Evaluation 11/28/20   written for 60 day POC   Authorization Type [employer] Medicare    PT Start Time 1403    PT Stop Time 1445    PT Time Calculation (min) 42 min    Equipment Utilized During Treatment Gait belt    Activity Tolerance Patient tolerated treatment well    Behavior During Therapy [facility] for tasks assessed/performed           Past Medical History:  Diagnosis Date  . Anxiety   . Atrial fibrillation ([place])   . Biceps tendon tear    right  . CAD (coronary artery disease)    [name]  . Gout   . Hypertension   . 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 CARDIAC CATHETERIZATION  03/02/2011   Recommended CABG  . COLON SURGERY    . CORONARY ARTERY BYPASS GRAFT  03/07/2011   DR.VAN  TRIGHT  . EYE SURGERY    . LEXISCAN MYOVIEW  02/20/2012   EKG negative for ischemia, noraml study  . TRANSTHORACIC ECHOCARDIOGRAM  02/20/2012   EF 50-55%, mild-moderate concentric LVH, LA moderately dilated, moderate mitral regurg  . VASCULAR DOPPLER  03/03/2011   Nosignificant extracranial carotid artery stenosis    There were no vitals filed for this visit.   Subjective Assessment - 09/09/20 1405    Subjective No new complaints. No fall or pain to report. Has been doing the HEP at home.    Patient is accompained by: Family member   spouse Shane Espinoza   Pertinent History CAF, CAD s/p CABG 2012, CVA 2015, A fib- on Xarelto, Severe tricompartmental Right knee OA    Limitations  Standing;Walking    How long can you walk comfortably? has been walking 20' at home with RW    Patient Stated Goals wants to be able to walk by himself.    Currently in Pain? No/denies    Pain Score 0-No pain              [facility] PT Assessment - 09/09/20 1410      Standardized Balance Assessment   Standardized Balance Assessment Berg Balance Test      Berg Balance Test   Sit to Stand Able to stand  independently using hands    Standing Unsupported Able to stand 2 minutes with supervision    Sitting with Back Unsupported but Feet Supported on Floor or Stool Able to sit safely and securely 2 minutes    Stand to Sit Uses backs of legs against chair to control descent    Transfers Able to transfer with verbal cueing and /or supervision    Standing Unsupported with Eyes Closed Able to stand 3 seconds   needed min assist around 5 seconds   Standing Unsupported with Feet Together Able to place feet together independently and stand for 1 minute with supervision    From Standing, Reach Forward with Outstretched Arm Can reach forward >5 cm safely (  2")   3 inches   From Standing Position, Pick up Object from Floor Unable to pick up shoe, but reaches 2-5 cm (1-2") from shoe and balances independently   reported feeling the CAM boot digging into leg at this point   From Standing Position, Turn to Look Behind Over each Shoulder Needs supervision when turning    Turn 360 Degrees Needs assistance while turning    Standing Unsupported, Alternately Place Feet on Step/Stool Needs assistance to keep from falling or unable to try    Standing Unsupported, One Foot in [place] to take small step independently and hold 30 seconds    Standing on One Leg Unable to try or needs assist to prevent fall    Total Score 26               [facility] Adult PT Treatment/Exercise - 09/09/20 1410      Transfers   Transfers Sit to Stand;Stand to Sit    Sit to Stand 4: Min guard    Sit to Stand Details Verbal cues  for technique;Verbal cues for sequencing    Stand to Sit 4: Min guard;4: Min assist    Stand to Sit Details cues to ensure at the surface and to reach back prior to sitting down      Ambulation/Gait   Ambulation/Gait Yes    Ambulation/Gait Assistance 4: Min guard    Ambulation/Gait Assistance Details cues for posture, step length and walker position. Min guard assist for safety with no physical assistance needed. appears antalgic however this could be due to height difference of CAM boot vs sneaker on other side.    Ambulation Distance (Feet) 80 Feet   x1, plus around gym as needed with session   Assistive device Rolling walker    Gait Pattern Decreased step length - left;Decreased stance time - right;Decreased weight shift to right;Step-through pattern;Step-to pattern    Ambulation Surface Level;Indoor    Gait velocity 23.28 sec's= 1.41 ft/sec with RW.      Self-Care   Self-Care Other Self-Care Comments    Other Self-Care Comments  Reviewed entire HEP with pt and spouse. Both report he has not been doing any of the bed (supine and sidelying ex's) since getting home. Pt also has not been doing the sit<>stands as well. Pt and spouse agree to start doing the sit<>stands again. Will modify the supine ex's to seated or standing at next session since pt is not lying in his bed at this time. Both report no issues with new ex's added for ankle or remaing seated ex's that he has been doing.                PT Short Term Goals - 08/30/20 1626      PT SHORT TERM GOAL #1   Title Pt will perform 5x sit <> stand in 32 seconds or less with UE support  in order to demo improved functional mobility. ALL STGS DUE 09/27/20    Baseline 40.13 seconds    Time 4    Period Weeks    Status New    Target Date 09/27/20      PT SHORT TERM GOAL #2   Title Pt will undergo BERG with STG and LTG written as appropriate.    Baseline not yet assessed.    Time 4    Period Weeks    Status New      PT SHORT TERM  GOAL #3   Title Pt will undergo  assessment of gait speed with with STG and LTG written as appropriate.    Time 4    Period Weeks    Status New      PT SHORT TERM GOAL #4   Title Pt will ambulate at least 61' with RW with supervision in order to demo improved household mobility.    Time 4    Period Weeks    Status New      PT SHORT TERM GOAL #5   Title Pt will perform TUG in 38 seconds or less with RW in order to demo decr fall risk.    Baseline 47.06 with RW    Time 4    Period Weeks    Status New             PT Long Term Goals - 08/30/20 1629      PT LONG TERM GOAL #1   Title Pt will be independent with final HEP in order to build upon functional gains made in therapy. ALL LTGS DUE 10/25/20    Time 8    Period Weeks    Status New    Target Date 10/25/20      PT LONG TERM GOAL #2   Title BERG goal to be written as appropriate.    Time 8    Period Weeks    Status New      PT LONG TERM GOAL #3   Title Pt will ambulate at least 300' with LRAD with supervision over unlevel surfaces in order to demo improved community mobility.    Time 8    Period Weeks    Status New      PT LONG TERM GOAL #4   Title Pt will perform TUG in 28 seconds or less with RW vs. LRAD  in order to demo decr fall risk.    Baseline 47.06 with RW    Time 8    Period Weeks    Status New      PT LONG TERM GOAL #5   Title Gait speed goal written as appropriate in order to demo decr fall risk.    Time 8    Period Weeks    Status New      Additional Long Term Goals   Additional Long Term Goals Yes      PT LONG TERM GOAL #6   Title Pt will perform 5x sit <> stand in 25 seconds or less with UE support in order to demo improved functional mobility.    Baseline 40.13 seconds    Time 8    Period Weeks    Status New      PT LONG TERM GOAL #7   Title Pt will improve FOTO score to a 64 in order to demo improved function.    Baseline 55    Time 8    Period Weeks    Status New                  Plan - 09/09/20 1406    Clinical Impression Statement Today's skilled session intially focused on obtaining baseline scores for Berg Balance Test with pt scoring 26/56 placing him in the high fall risk cateogory and for the 10 meter walk with pt's time being 1.41 ft/sec with RW/CAM boot. Primary PT to update goals. Remainder of session focused on review of pt's HEP. Pt is not doing any of the bed ex's as he is still sleeping in the lift chair. Will need to  revise these to seated/standing at next session. The pt is progressing toward goals and should benefit from continued PT to progress toward unmet goals.    Personal Factors and Comorbidities Comorbidity 3+;Past/Current Experience    Comorbidities CAF, CAD s/p CABG 2012, CVA 2015, A fib- on Xarelto, R ankle fx    Examination-Activity Limitations Stairs;Stand;Transfers;Squat;Locomotion Level;Carry    Examination-Participation Restrictions Community Activity;Cleaning    Stability/Clinical Decision Making Evolving/Moderate complexity    Rehab Potential Good    PT Frequency 2x / week    PT Duration 8 weeks    PT Treatment/Interventions ADLs/Self Care Home Management;Gait training;DME Instruction;Functional mobility training;Therapeutic activities;Neuromuscular re-education;Balance training;Stair training;Therapeutic exercise;Patient/family education;Passive range of motion;Vestibular;Manual techniques    PT Next Visit Plan revise supine/sidelying ex's to seated/standing as pt still sleeping in lift chair and has not been doing them; continue to work on LE strengthening, standing balance with decreased UE support and gait with RW.    PT Home Exercise Plan Access Code: [email or web address]    Consulted and Agree with Plan of Care Patient;Family member/caregiver    Family Member Consulted wife susan           Patient will benefit from skilled therapeutic intervention in order to improve the following deficits and impairments:  Abnormal  gait,Decreased balance,Decreased activity tolerance,Decreased coordination,Decreased knowledge of use of DME,Decreased range of motion,Difficulty walking,Decreased strength,Hypomobility,Postural dysfunction  Visit Diagnosis: Muscle weakness (generalized)  Unsteadiness on feet  Other abnormalities of gait and mobility  History of falling     Problem List Patient Active Problem List   Diagnosis Date Noted  . Diabetes ([place]) 08/30/2020  . Ankle fracture, right 08/30/2020  . Knee pain 08/30/2020  . 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 Acute CVA (cerebrovascular accident) ([place]) 08/06/2020  . TIA (transient ischemic attack) 08/05/2020  . Gout 12/07/2013  . Atrial fibrillation ([place]) 12/07/2013  . Acute thrombotic stroke ([place]) 09/23/2013  . CVA (cerebral infarction) 09/23/2013  . Hyperlipidemia with target LDL less than 70 04/16/2013  . CAD (coronary artery disease)   . Anxiety   . Hypertension   . S/P CABG x [place], [place], [facility] [address] [place], [place] [address] [phone] 09/10/20, 11:36 AM   Name: RIKU BUTTERY MRN: [id number] Date of Birth: 12/04/1940

## 2020-09-13 ENCOUNTER — Encounter: Payer: Self-pay | Admitting: Occupational Therapy

## 2020-09-13 ENCOUNTER — Ambulatory Visit: Payer: Medicare HMO | Admitting: Physical Therapy

## 2020-09-13 ENCOUNTER — Other Ambulatory Visit: Payer: Self-pay

## 2020-09-13 ENCOUNTER — Ambulatory Visit: Payer: Medicare HMO | Admitting: Occupational Therapy

## 2020-09-13 DIAGNOSIS — R278 Other lack of coordination: Secondary | ICD-10-CM

## 2020-09-13 DIAGNOSIS — R2689 Other abnormalities of gait and mobility: Secondary | ICD-10-CM

## 2020-09-13 DIAGNOSIS — R2681 Unsteadiness on feet: Secondary | ICD-10-CM | POA: Diagnosis not present

## 2020-09-13 DIAGNOSIS — M6281 Muscle weakness (generalized): Secondary | ICD-10-CM

## 2020-09-13 DIAGNOSIS — Z9181 History of falling: Secondary | ICD-10-CM | POA: Diagnosis not present

## 2020-09-13 NOTE — Patient Instructions (Signed)
Access Code: [email or web address] URL: [email or web address] Date: 09/13/2020 Prepared by: Janann August  Exercises Sit to Stand - 1 x daily - 7 x weekly - 3 sets - 10 reps Seated Long Arc Quad - 1 x daily - 7 x weekly - 3 sets - 10 reps Seated March with Resistance - 1 x daily - 7 x weekly - 3 sets - 10 reps Seated Ankle Alphabet - 1 x daily - 7 x weekly - 3 sets - 10 reps Seated Ankle Dorsiflexion with Anchored Resistance - 1 x daily - 7 x weekly - 2 sets - 10 reps Seated Ankle Plantar Flexion with Resistance Loop - 1 x daily - 7 x weekly - 2 sets - 10 reps Seated Hip Abduction with Resistance - 1 x daily - 7 x weekly - 2 sets - 10 reps

## 2020-09-13 NOTE — Therapy (Signed)
[facility] [address] [place], [place], [address] Phone: [phone]   Fax:  [phone]  Physical Therapy Treatment  Patient Details  Name: Shane Espinoza MRN: [id number] Date of Birth: April 20, 1941 Referring Provider (PT): Letta Pate, Luanna Salk, MD (being followed by)   Encounter Date: 09/13/2020   PT End of Session - 09/13/20 1624    Visit Number 4    Number of Visits 17    Date for PT Re-Evaluation 11/28/20   written for 60 day POC   Authorization Type Aetna Medicare    PT Start Time 1533    PT Stop Time 1614    PT Time Calculation (min) 41 min    Equipment Utilized During Treatment Gait belt    Activity Tolerance Patient tolerated treatment well    Behavior During Therapy [facility] for tasks assessed/performed           Past Medical History:  Diagnosis Date  . Anxiety   . Atrial fibrillation ([place])   . Biceps tendon tear    right  . CAD (coronary artery disease)    [name]  . Gout   . Hypertension   . 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 CARDIAC CATHETERIZATION  03/02/2011   Recommended CABG  . COLON SURGERY    . CORONARY ARTERY BYPASS GRAFT  03/07/2011   DR.VAN  TRIGHT  . EYE SURGERY    . LEXISCAN MYOVIEW  02/20/2012   EKG negative for ischemia, noraml study  . TRANSTHORACIC ECHOCARDIOGRAM  02/20/2012   EF 50-55%, mild-moderate concentric LVH, LA moderately dilated, moderate mitral regurg  . VASCULAR DOPPLER  03/03/2011   Nosignificant extracranial carotid artery stenosis    There were no vitals filed for this visit.   Subjective Assessment - 09/13/20 1535    Subjective Feels like his balance is improving. Has been doing the exercises at home, except for the ones laying down. Sees Dr. Letta Pate on Friday.    Patient is accompained by: Family member   spouse Shane Espinoza   Pertinent History CAF, CAD s/p CABG 2012, CVA 2015, A fib- on Xarelto,  Severe tricompartmental Right knee OA    Limitations Standing;Walking    How long can you walk comfortably? has been walking 20' at home with RW    Patient Stated Goals wants to be able to walk by himself.    Currently in Pain? No/denies                             [facility] Adult PT Treatment/Exercise - 09/13/20 1556      Transfers   Transfers Sit to Stand;Stand to Sit    Sit to Stand 4: Min guard    Sit to Stand Details Verbal cues for technique;Verbal cues for sequencing    Sit to Stand Details (indicate cue type and reason) cues for proper UE placement    Stand to Sit 4: Min guard;4: Min assist    Stand to Sit Details cues to reach back to control descent    Comments performed approx. 5 reps throughout session      Ambulation/Gait   Ambulation/Gait Yes    Ambulation/Gait Assistance 4: Min guard    Ambulation/Gait Assistance Details cues intermittently for posture    Ambulation Distance (Feet) 230 Feet    Assistive device Rolling walker    Gait Pattern Decreased  step length - left;Decreased stance time - right;Decreased weight shift to right;Step-through pattern;Step-to pattern    Ambulation Surface Level;Indoor               Balance Exercises - 09/13/20 1607      Balance Exercises: Standing   Standing Eyes Opened Wide (BOA);Foam/compliant surface;Solid surface;Limitations    Standing Eyes Opened Limitations with chair in front of pt: on level ground: x10 reps head turns, x10 reps head nods, performed same activity standing on single pillow, pt with more difficulty with head turns, alternating arm raises x5 reps b    Standing Eyes Closed Solid surface;Wide (BOA);Limitations    Standing Eyes Closed Limitations with RW in front of pt, 3 x 20 seconds           Access Code: [email or web address] URL: [email or web address] Date: 09/13/2020 Prepared by: Janann August  Revised some exercises on pt's HEP due to pt not being able to be supine (exchanged  clamshells for seated hip ABD and bridges for standing with glute squeeze)   Exercises Sit to Stand - 1 x daily - 7 x weekly - 3 sets - 10 reps - for hip extension, cues for glute squeeze x5 reps after each rep in standing  Seated Long Arc Quad - 1 x daily - 7 x weekly - 3 sets - 10 reps Seated March with Resistance - 1 x daily - 7 x weekly - 3 sets - 10 reps Seated Ankle Alphabet - 1 x daily - 7 x weekly - 3 sets - 10 reps Seated Ankle Dorsiflexion with Anchored Resistance - 1 x daily - 7 x weekly - 2 sets - 10 reps - demonstrated how pt can use his other foot to help keep resistance steady in sitting  Seated Ankle Plantar Flexion with Resistance Loop - 1 x daily - 7 x weekly - 2 sets - 10 reps Seated Hip Abduction with Resistance - 1 x daily - 7 x weekly - 2 sets - 10 reps - with red band, keeping one leg still and moving the other one out x10 reps each side     PT Education - 09/13/20 1624    Education Details revisions to HEP    Person(s) Educated Patient;Spouse    Methods Explanation;Demonstration;Handout    Comprehension Verbalized understanding;Returned demonstration            PT Short Term Goals - 08/30/20 1626      PT SHORT TERM GOAL #1   Title Pt will perform 5x sit <> stand in 32 seconds or less with UE support  in order to demo improved functional mobility. ALL STGS DUE 09/27/20    Baseline 40.13 seconds    Time 4    Period Weeks    Status New    Target Date 09/27/20      PT SHORT TERM GOAL #2   Title Pt will undergo BERG with STG and LTG written as appropriate.    Baseline not yet assessed.    Time 4    Period Weeks    Status New      PT SHORT TERM GOAL #3   Title Pt will undergo assessment of gait speed with with STG and LTG written as appropriate.    Time 4    Period Weeks    Status New      PT SHORT TERM GOAL #4   Title Pt will ambulate at least 33' with RW with supervision in order to demo improved household mobility.  Time 4    Period Weeks     Status New      PT SHORT TERM GOAL #5   Title Pt will perform TUG in 38 seconds or less with RW in order to demo decr fall risk.    Baseline 47.06 with RW    Time 4    Period Weeks    Status New             PT Long Term Goals - 08/30/20 1629      PT LONG TERM GOAL #1   Title Pt will be independent with final HEP in order to build upon functional gains made in therapy. ALL LTGS DUE 10/25/20    Time 8    Period Weeks    Status New    Target Date 10/25/20      PT LONG TERM GOAL #2   Title BERG goal to be written as appropriate.    Time 8    Period Weeks    Status New      PT LONG TERM GOAL #3   Title Pt will ambulate at least 300' with LRAD with supervision over unlevel surfaces in order to demo improved community mobility.    Time 8    Period Weeks    Status New      PT LONG TERM GOAL #4   Title Pt will perform TUG in 28 seconds or less with RW vs. LRAD  in order to demo decr fall risk.    Baseline 47.06 with RW    Time 8    Period Weeks    Status New      PT LONG TERM GOAL #5   Title Gait speed goal written as appropriate in order to demo decr fall risk.    Time 8    Period Weeks    Status New      Additional Long Term Goals   Additional Long Term Goals Yes      PT LONG TERM GOAL #6   Title Pt will perform 5x sit <> stand in 25 seconds or less with UE support in order to demo improved functional mobility.    Baseline 40.13 seconds    Time 8    Period Weeks    Status New      PT LONG TERM GOAL #7   Title Pt will improve FOTO score to a 64 in order to demo improved function.    Baseline 55    Time 8    Period Weeks    Status New                 Plan - 09/13/20 1632    Clinical Impression Statement Revised some exercises for hip ABD/hip EXT for pt's HEP as pt was not doing the bed exercises prior as pt is still sleeping in the lift chair. Pt able to progress gait distance today to 230' with RW and min guard. Will continue to progress towards LTGs.     Personal Factors and Comorbidities Comorbidity 3+;Past/Current Experience    Comorbidities CAF, CAD s/p CABG 2012, CVA 2015, A fib- on Xarelto, R ankle fx    Examination-Activity Limitations Stairs;Stand;Transfers;Squat;Locomotion Level;Carry    Examination-Participation Restrictions Community Activity;Cleaning    Stability/Clinical Decision Making Evolving/Moderate complexity    Rehab Potential Good    PT Frequency 2x / week    PT Duration 8 weeks    PT Treatment/Interventions ADLs/Self Care Home Management;Gait training;DME Instruction;Functional mobility training;Therapeutic activities;Neuromuscular re-education;Balance  training;Stair training;Therapeutic exercise;Patient/family education;Passive range of motion;Vestibular;Manual techniques    PT Next Visit Plan continue to work on LE strengthening, standing balance with decreased UE support and gait with RW.    PT Home Exercise Plan Access Code: [email or web address]    Consulted and Agree with Plan of Care Patient;Family member/caregiver    Family Member Consulted wife susan           Patient will benefit from skilled therapeutic intervention in order to improve the following deficits and impairments:  Abnormal gait,Decreased balance,Decreased activity tolerance,Decreased coordination,Decreased knowledge of use of DME,Decreased range of motion,Difficulty walking,Decreased strength,Hypomobility,Postural dysfunction  Visit Diagnosis: Other lack of coordination  Muscle weakness (generalized)  Unsteadiness on feet  Other abnormalities of gait and mobility     Problem List Patient Active Problem List   Diagnosis Date Noted  . Diabetes ([place]) 08/30/2020  . Ankle fracture, right 08/30/2020  . Knee pain 08/30/2020  . Stroke (cerebrum) ([place]) 08/11/2020  . Acute CVA (cerebrovascular accident) ([place]) 08/06/2020  . TIA (transient ischemic attack) 08/05/2020  . Gout 12/07/2013  . Atrial fibrillation ([place]) 12/07/2013  . Acute thrombotic stroke  ([place]) 09/23/2013  . CVA (cerebral infarction) 09/23/2013  . Hyperlipidemia with target LDL less than 70 04/16/2013  . CAD (coronary artery disease)   . Anxiety   . Hypertension   . S/P CABG x 4     Arliss Journey, PT, DPT  09/13/2020, 4:33 PM  [facility] [address] [place], [place], [address] Phone: [phone]   Fax:  [phone]  Name: ICKER SWIGERT MRN: [id number] Date of Birth: 12-Feb-1941

## 2020-09-13 NOTE — Therapy (Signed)
[facility] [address] [place], [place], [address] Phone: [phone]   Fax:  [phone]  Occupational Therapy Treatment  Patient Details  Name: Shane Espinoza MRN: [id number] Date of Birth: [birth year] Referring Provider (OT): Dr. Letta Pate   Encounter Date: 09/13/2020   OT End of Session - 09/13/20 1619    Visit Number 4    Number of Visits 9    Date for OT Re-Evaluation 09/27/20    Authorization Type [employer] MCR - VL: [place], $35 copay per day    OT Start Time 1618    OT Stop Time 1700    OT Time Calculation (min) 42 min    Activity Tolerance Patient tolerated treatment well    Behavior During Therapy [facility] for tasks assessed/performed           Past Medical History:  Diagnosis Date  . Anxiety   . Atrial fibrillation ([place])   . Biceps tendon tear    right  . CAD (coronary artery disease)    [name]  . Gout   . Hypertension   . 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 CARDIAC CATHETERIZATION  03/02/2011   Recommended CABG  . COLON SURGERY    . CORONARY ARTERY BYPASS GRAFT  03/07/2011   DR.VAN  TRIGHT  . EYE SURGERY    . LEXISCAN MYOVIEW  02/20/2012   EKG negative for ischemia, noraml study  . TRANSTHORACIC ECHOCARDIOGRAM  02/20/2012   EF 50-55%, mild-moderate concentric LVH, LA moderately dilated, moderate mitral regurg  . VASCULAR DOPPLER  03/03/2011   Nosignificant extracranial carotid artery stenosis    There were no vitals filed for this visit.   Subjective Assessment - 09/13/20 1619    Subjective  Pt reports some discomfort with arthritis in his knees this day - not related to OT.    Patient is accompanied by: Family member   wife   Pertinent History Rt CVA 08/05/20, ankle fx 07/07/20 from fall. PMH: CAF, CAD s/p CABG 2012, CVA 2015 (completed resolved), HTN, A-fib,    Limitations CAM boot, WBAT, No driving, no heavy lifting (old Rt biceps  rupture)    Currently in Pain? No/denies                        OT Treatments/Exercises (OP) - 09/13/20 1629      ADLs   Toileting simulated clothing management with clothespins on pants and working on sit to stand - sit to stand with SBA from low mat and verbal cues for leaning forward. Pt required SBA for reaching down to obtain 8 x resistance clothespins for working on technique and standing balance with 2 wheel rolling walker      Exercises   Exercises Hand      Fine Motor Coordination (Hand/Wrist)   Fine Motor Coordination Small Pegboard;Nuts and Bolts    Small Pegboard with LUE with placing small pegs into board and copying pattern. Pt reqiured increased time but copied pattern with little to no difficulty, no drops    Nuts and Bolts removing nuts and bolts and placing back with min drops and requiring increased time.        Dynamic standing balance with weight shifts - in standing with SBA used LUE To pick up 1 inch blocks and reach across midline to place on 2 foot block in slot.  OT Long Term Goals - 09/13/20 1631      OT LONG TERM GOAL #1   Title Independent with UB strengthening HEP and Lt hand coordination HEP    Time 4    Period Weeks    Status Achieved      OT LONG TERM GOAL #2   Title Pt to perform toilet and shower transfers consistently w/ supervision only    Time 4    Period Weeks    Status Partially Met   performing shower transfer but still needing some lift assistance during toilet transfers. 09/13/20     OT LONG TERM GOAL #3   Title Pt to stand to perform clothes management after toileting mod I level    Time 4    Period Weeks    Status On-going   increased time and mod I level - 09/13/20     OT LONG TERM GOAL #4   Title Pt to improve coordination Lt hand as evidenced by performing 9 hole peg test in 35 sec or less    Baseline 39.94 sec    Time 4    Period Weeks    Status On-going      OT LONG TERM GOAL #5    Title Pt to perform dynamic standing task w/ countertop support prn for 10 min w/o rest    Time 4    Period Weeks    Status On-going                 Plan - 09/13/20 1634    Clinical Impression Statement Pt continues to progress towards goals. Pt is increasing participation in ADLs and independence. Pt continues to benefit from therapy for dynamic standing balance for ADLS and IADLs    OT Occupational Profile and History Problem Focused Assessment - Including review of records relating to presenting problem    Occupational performance deficits (Please refer to evaluation for details): ADL's;IADL's;Social Participation    Body Structure / Function / Physical Skills Balance;Strength;IADL;Mobility;Decreased knowledge of precautions;FMC;Coordination;ADL    Rehab Potential Good    Clinical Decision Making Several treatment options, min-mod task modification necessary    Comorbidities Affecting Occupational Performance: Presence of comorbidities impacting occupational performance    Comorbidities impacting occupational performance description: Rt ankle fx    Modification or Assistance to Complete Evaluation  No modification of tasks or assist necessary to complete eval    OT Frequency 2x / week    OT Duration 4 weeks   plus eval   OT Treatment/Interventions Self-care/ADL training;DME and/or AE instruction;Therapeutic activities;Therapeutic exercise;Coping strategies training;Functional Mobility Training;Neuromuscular education;Passive range of motion;Patient/family education;Energy conservation    Plan dynamic standing/standing tolerance    Consulted and Agree with Plan of Care Patient;Family member/caregiver    Family Member Consulted wife           Patient will benefit from skilled therapeutic intervention in order to improve the following deficits and impairments:   Body Structure / Function / Physical Skills: Balance,Strength,IADL,Mobility,Decreased knowledge of  precautions,FMC,Coordination,ADL       Visit Diagnosis: Other lack of coordination  Muscle weakness (generalized)  Unsteadiness on feet  Other abnormalities of gait and mobility    Problem List Patient Active Problem List   Diagnosis Date Noted  . Diabetes ([place]) 08/30/2020  . Ankle fracture, right 08/30/2020  . Knee pain 08/30/2020  . 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 Acute CVA (cerebrovascular accident) ([place]) 08/06/2020  . TIA (transient ischemic attack) 08/05/2020  . Gout 12/07/2013  .  Atrial fibrillation ([place]) 12/07/2013  . Acute thrombotic stroke ([place]) 09/23/2013  . CVA (cerebral infarction) 09/23/2013  . Hyperlipidemia with target LDL less than 70 04/16/2013  . CAD (coronary artery disease)   . Anxiety   . Hypertension   . S/P CABG x 4     Zachery Conch Fulton, OTR/L  09/13/2020, 5:01 PM  [facility] [address] [place], [place], [address] Phone: [phone]   Fax:  [phone]  Name: Shane Espinoza MRN: [id number] Date of Birth: [birth year]

## 2020-09-15 ENCOUNTER — Encounter: Payer: Self-pay | Admitting: Physical Therapy

## 2020-09-15 ENCOUNTER — Ambulatory Visit: Payer: Medicare HMO | Admitting: Occupational Therapy

## 2020-09-15 ENCOUNTER — Ambulatory Visit: Payer: Medicare HMO | Admitting: Physical Therapy

## 2020-09-15 ENCOUNTER — Other Ambulatory Visit: Payer: Self-pay

## 2020-09-15 DIAGNOSIS — R2689 Other abnormalities of gait and mobility: Secondary | ICD-10-CM

## 2020-09-15 DIAGNOSIS — M6281 Muscle weakness (generalized): Secondary | ICD-10-CM

## 2020-09-15 DIAGNOSIS — R2681 Unsteadiness on feet: Secondary | ICD-10-CM

## 2020-09-15 DIAGNOSIS — Z9181 History of falling: Secondary | ICD-10-CM | POA: Diagnosis not present

## 2020-09-15 DIAGNOSIS — R278 Other lack of coordination: Secondary | ICD-10-CM | POA: Diagnosis not present

## 2020-09-15 NOTE — Therapy (Signed)
[facility] [address] [place], [place], [address] Phone: [phone]   Fax:  [phone]  Occupational Therapy Treatment  Patient Details  Name: Shane Espinoza MRN: [id number] Date of Birth: [birth year] Referring Provider (OT): Dr. Letta Pate   Encounter Date: 09/15/2020   OT End of Session - 09/15/20 1509    Visit Number 5    Number of Visits 9    Date for OT Re-Evaluation 09/27/20    Authorization Type Aetna MCR - VL: [place], $35 copay per day    OT Start Time 1400    OT Stop Time 1445    OT Time Calculation (min) 45 min    Activity Tolerance Patient tolerated treatment well    Behavior During Therapy [facility] for tasks assessed/performed           Past Medical History:  Diagnosis Date  . Anxiety   . Atrial fibrillation ([place])   . Biceps tendon tear    right  . CAD (coronary artery disease)    [name]  . Gout   . Hypertension   . 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 CARDIAC CATHETERIZATION  03/02/2011   Recommended CABG  . COLON SURGERY    . CORONARY ARTERY BYPASS GRAFT  03/07/2011   DR.VAN  TRIGHT  . EYE SURGERY    . LEXISCAN MYOVIEW  02/20/2012   EKG negative for ischemia, noraml study  . TRANSTHORACIC ECHOCARDIOGRAM  02/20/2012   EF 50-55%, mild-moderate concentric LVH, LA moderately dilated, moderate mitral regurg  . VASCULAR DOPPLER  03/03/2011   Nosignificant extracranial carotid artery stenosis    There were no vitals filed for this visit.   Subjective Assessment - 09/15/20 1404    Subjective  Pt reports some discomfort with arthritis in his Rt knee this day - not related to OT.    Patient is accompanied by: Family member   wife   Pertinent History Rt CVA 08/05/20, ankle fx 07/07/20 from fall. PMH: CAF, CAD s/p CABG 2012, CVA 2015 (completed resolved), HTN, A-fib,    Limitations CAM boot, WBAT, No driving, no heavy lifting (old Rt  biceps rupture)    Currently in Pain? No/denies           Standing with countertop support - side stepping along counter to Rt and Lt in prep for retrieving items for snack. Progressed to retrieving items (10) from mid level shelf RUE and placing on lower surface and back, then repeating on Lt side same activity. Pt then rested seated in chair prior to next activity.  Pt standing on foam compliant surface while placing medium sized pegs in pegboard with one hand support - one row with Rt hand, 2nd row with Lt hand, pt then required rest break prior to removing pegs same way.  UBE x 8 min (4 min forward, 4 min backward), level 1, BUE's for UE strength/endurance                          OT Long Term Goals - 09/13/20 1631      OT LONG TERM GOAL #1   Title Independent with UB strengthening HEP and Lt hand coordination HEP    Time 4    Period Weeks    Status Achieved      OT LONG TERM GOAL #2   Title Pt to perform toilet and  shower transfers consistently w/ supervision only    Time 4    Period Weeks    Status Partially Met   performing shower transfer but still needing some lift assistance during toilet transfers. 09/13/20     OT LONG TERM GOAL #3   Title Pt to stand to perform clothes management after toileting mod I level    Time 4    Period Weeks    Status On-going   increased time and mod I level - 09/13/20     OT LONG TERM GOAL #4   Title Pt to improve coordination Lt hand as evidenced by performing 9 hole peg test in 35 sec or less    Baseline 39.94 sec    Time 4    Period Weeks    Status On-going      OT LONG TERM GOAL #5   Title Pt to perform dynamic standing task w/ countertop support prn for 10 min w/o rest    Time 4    Period Weeks    Status On-going                 Plan - 09/15/20 1509    Clinical Impression Statement Pt progressing with dynamic standing balance tasks with one hand support required. Pt fatigues quickly with standing  tasks    OT Occupational Profile and History Problem Focused Assessment - Including review of records relating to presenting problem    Occupational performance deficits (Please refer to evaluation for details): ADL's;IADL's;Social Participation    Body Structure / Function / Physical Skills Balance;Strength;IADL;Mobility;Decreased knowledge of precautions;FMC;Coordination;ADL    Rehab Potential Good    Clinical Decision Making Several treatment options, min-mod task modification necessary    Comorbidities Affecting Occupational Performance: Presence of comorbidities impacting occupational performance    Comorbidities impacting occupational performance description: Rt ankle fx    Modification or Assistance to Complete Evaluation  No modification of tasks or assist necessary to complete eval    OT Frequency 2x / week    OT Duration 4 weeks   plus eval   OT Treatment/Interventions Self-care/ADL training;DME and/or AE instruction;Therapeutic activities;Therapeutic exercise;Coping strategies training;Functional Mobility Training;Neuromuscular education;Passive range of motion;Patient/family education;Energy conservation    Plan continue dynamic standing/standing tolerance and progress towards goals    Consulted and Agree with Plan of Care Patient;Family member/caregiver    Family Member Consulted wife           Patient will benefit from skilled therapeutic intervention in order to improve the following deficits and impairments:   Body Structure / Function / Physical Skills: Balance,Strength,IADL,Mobility,Decreased knowledge of precautions,FMC,Coordination,ADL       Visit Diagnosis: Unsteadiness on feet  Other lack of coordination    Problem List Patient Active Problem List   Diagnosis Date Noted  . Diabetes ([place]) 08/30/2020  . Ankle fracture, right 08/30/2020  . Knee pain 08/30/2020  . Stroke (cerebrum) ([place]) 08/11/2020  . Acute CVA (cerebrovascular accident) ([place]) 08/06/2020  .  TIA (transient ischemic attack) 08/05/2020  . Gout 12/07/2013  . Atrial fibrillation ([place]) 12/07/2013  . Acute thrombotic stroke ([place]) 09/23/2013  . CVA (cerebral infarction) 09/23/2013  . Hyperlipidemia with target LDL less than 70 04/16/2013  . CAD (coronary artery disease)   . Anxiety   . Hypertension   . S/P CABG x 4     Carey Bullocks, OTR/L 09/15/2020, 3:11 PM  [facility] [address] [place], [place], [address] Phone: [phone]   Fax:  [phone]  Name: Shane Espinoza MRN: [id number] Date of Birth: Apr 22, 1941

## 2020-09-15 NOTE — Therapy (Signed)
[facility] [address] [place], [place], [address] Phone: [phone]   Fax:  [phone]  Physical Therapy Treatment  Patient Details  Name: Shane Espinoza MRN: [id number] Date of Birth: [birth year] Referring Provider (PT): Letta Pate, Luanna Salk, MD (being followed by)   Encounter Date: 09/15/2020   PT End of Session - 09/15/20 1732    Visit Number 5    Number of Visits 17    Date for PT Re-Evaluation 11/28/20   written for 60 day POC   Authorization Type Aetna Medicare    PT Start Time 1450    PT Stop Time 1530    PT Time Calculation (min) 40 min    Equipment Utilized During Treatment Gait belt    Activity Tolerance Patient tolerated treatment well    Behavior During Therapy [facility] for tasks assessed/performed           Past Medical History:  Diagnosis Date  . Anxiety   . Atrial fibrillation ([place])   . Biceps tendon tear    right  . CAD (coronary artery disease)    [name]  . Gout   . Hypertension   . 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 CARDIAC CATHETERIZATION  03/02/2011   Recommended CABG  . COLON SURGERY    . CORONARY ARTERY BYPASS GRAFT  03/07/2011   DR.VAN  TRIGHT  . EYE SURGERY    . LEXISCAN MYOVIEW  02/20/2012   EKG negative for ischemia, noraml study  . TRANSTHORACIC ECHOCARDIOGRAM  02/20/2012   EF 50-55%, mild-moderate concentric LVH, LA moderately dilated, moderate mitral regurg  . VASCULAR DOPPLER  03/03/2011   Nosignificant extracranial carotid artery stenosis    There were no vitals filed for this visit.   Subjective Assessment - 09/15/20 1451    Subjective Had a good session with OT. Tried the updated exercises at home and they are going well.    Patient is accompained by: Family member   spouse Shane Espinoza   Pertinent History CAF, CAD s/p CABG 2012, CVA 2015, A fib- on Xarelto, Severe tricompartmental Right knee OA     Limitations Standing;Walking    How long can you walk comfortably? has been walking 20' at home with RW    Patient Stated Goals wants to be able to walk by himself.    Currently in Pain? Yes    Pain Score 3     Pain Location Knee    Pain Orientation Right    Pain Descriptors / Indicators --   just feels a little more sensitive   Pain Type Chronic pain    Aggravating Factors  nothing.    Pain Relieving Factors voltaren gel                             [facility] Adult PT Treatment/Exercise - 09/15/20 1502      Transfers   Transfers Sit to Stand;Stand to Sit    Sit to Stand 4: Min guard    Sit to Stand Details Verbal cues for technique;Verbal cues for sequencing    Sit to Stand Details (indicate cue type and reason) initial cues for proper UE placement    Stand to Sit 4: Min guard;4: Min assist    Comments performed x5 reps at start of session, cues for posture upon standing, plus additional reps between exercise, pt performing  with better technique today.      Ambulation/Gait   Ambulation/Gait Yes    Ambulation/Gait Assistance 4: Min guard    Ambulation/Gait Assistance Details w/c follow for safety - not needed today. cues intermittently for posture, one episode of R knee almost catching, just needing min guard, no LOB    Ambulation Distance (Feet) 345 Feet    Assistive device Rolling walker    Gait Pattern Decreased step length - left;Decreased stance time - right;Decreased weight shift to right;Step-through pattern;Step-to pattern    Ambulation Surface Level;Indoor      Exercises   Exercises Knee/Hip    Other Exercises  standing at RW: weight shifting R/L (R WBAT) with UE support then fingertip support, x15 reps      Knee/Hip Exercises: Standing   Knee Flexion Strengthening;AROM;Right;2 sets;10 reps    Knee Flexion Limitations with BUE support, cues for posture    Hip Flexion Stengthening;AROM;Right;2 sets;10 reps    Hip Flexion Limitations standing at RW    Hip  Abduction Stengthening;AROM;Right;2 sets;10 reps    Abduction Limitations with BUE support, stepping RLE out and then back in    Other Standing Knee Exercises standing with BUE support: 2 x 5 reps, cues for posture, technique and glute activation upon standing      Knee/Hip Exercises: Supine   Quad Sets Strengthening;AROM;Right;1 set;10 reps  With 3 second holds    Bridges Strengthening;AROM;1 set;Both;10 reps    Bridges Limitations cues for ROM and holding at end of ROM for a couple seconds.                    PT Short Term Goals - 09/15/20 0836      PT SHORT TERM GOAL #1   Title Pt will perform 5x sit <> stand in 32 seconds or less with UE support  in order to demo improved functional mobility. ALL STGS DUE 09/27/20    Baseline 40.13 seconds    Time 4    Period Weeks    Status New    Target Date 09/27/20      PT SHORT TERM GOAL #2   Title Pt will improve BERG to at least a 31/56 to decr fall risk.    Baseline 26/56    Time 4    Period Weeks    Status Revised      PT SHORT TERM GOAL #3   Title Pt will improve gait speed with RW to at least 1.7 ft/sec in order to demo decr fall risk.    Baseline 1.41 ft/sec with RW    Time 4    Period Weeks    Status Revised      PT SHORT TERM GOAL #4   Title Pt will ambulate at least 40' with RW with supervision in order to demo improved household mobility.    Time 4    Period Weeks    Status New      PT SHORT TERM GOAL #5   Title Pt will perform TUG in 38 seconds or less with RW in order to demo decr fall risk.    Baseline 47.06 with RW    Time 4    Period Weeks    Status New             PT Long Term Goals - 09/15/20 2376      PT LONG TERM GOAL #1   Title Pt will be independent with final HEP in order to build upon functional  gains made in therapy. ALL LTGS DUE 10/25/20    Time 8    Period Weeks    Status New      PT LONG TERM GOAL #2   Title Pt will improve BERG to at least a 37/56 to decr fall risk.     Baseline 26/56    Time 8    Period Weeks    Status Revised      PT LONG TERM GOAL #3   Title Pt will ambulate at least 300' with LRAD with supervision over unlevel surfaces in order to demo improved community mobility.    Time 8    Period Weeks    Status New      PT LONG TERM GOAL #4   Title Pt will perform TUG in 28 seconds or less with RW vs. LRAD  in order to demo decr fall risk.    Baseline 47.06 with RW    Time 8    Period Weeks    Status New      PT LONG TERM GOAL #5   Title Pt will improve gait speed with RW to at least 2.0 ft/sec in order to demo decr fall risk.    Baseline 1.41 ft/sec with RW    Time 8    Period Weeks    Status Revised      PT LONG TERM GOAL #6   Title Pt will perform 5x sit <> stand in 25 seconds or less with UE support in order to demo improved functional mobility.    Baseline 40.13 seconds    Time 8    Period Weeks    Status New      PT LONG TERM GOAL #7   Title Pt will improve FOTO score to a 64 in order to demo improved function.    Baseline 55    Time 8    Period Weeks    Status New                 Plan - 09/15/20 1734    Clinical Impression Statement Pt able to incr gait distance with RW to 345' today with min guard with pt not using a rest break. Remainder of session focused on RLE>LLE strengthening in standing and supine. Pt tolerated session well, intermittent seated rest breaks taken. Will continue to progress towards LTGs.    Personal Factors and Comorbidities Comorbidity 3+;Past/Current Experience    Comorbidities CAF, CAD s/p CABG 2012, CVA 2015, A fib- on Xarelto, R ankle fx    Examination-Activity Limitations Stairs;Stand;Transfers;Squat;Locomotion Level;Carry    Examination-Participation Restrictions Community Activity;Cleaning    Stability/Clinical Decision Making Evolving/Moderate complexity    Rehab Potential Good    PT Frequency 2x / week    PT Duration 8 weeks    PT Treatment/Interventions ADLs/Self Care Home  Management;Gait training;DME Instruction;Functional mobility training;Therapeutic activities;Neuromuscular re-education;Balance training;Stair training;Therapeutic exercise;Patient/family education;Passive range of motion;Vestibular;Manual techniques    PT Next Visit Plan how did MD appt go? balance exercises in // bars, continue to work on LE strengthening, standing balance with decreased UE support and gait with RW.    PT Home Exercise Plan Access Code: [email or web address]    Consulted and Agree with Plan of Care Patient;Family member/caregiver    Family Member Consulted wife susan           Patient will benefit from skilled therapeutic intervention in order to improve the following deficits and impairments:  Abnormal gait,Decreased balance,Decreased activity tolerance,Decreased coordination,Decreased knowledge of use of  DME,Decreased range of motion,Difficulty walking,Decreased strength,Hypomobility,Postural dysfunction  Visit Diagnosis: Other lack of coordination  Muscle weakness (generalized)  Unsteadiness on feet  Other abnormalities of gait and mobility     Problem List Patient Active Problem List   Diagnosis Date Noted  . Diabetes ([place]) 08/30/2020  . Ankle fracture, right 08/30/2020  . Knee pain 08/30/2020  . Stroke (cerebrum) ([place]) 08/11/2020  . Acute CVA (cerebrovascular accident) ([place]) 08/06/2020  . TIA (transient ischemic attack) 08/05/2020  . Gout 12/07/2013  . Atrial fibrillation ([place]) 12/07/2013  . Acute thrombotic stroke ([place]) 09/23/2013  . CVA (cerebral infarction) 09/23/2013  . Hyperlipidemia with target LDL less than 70 04/16/2013  . CAD (coronary artery disease)   . Anxiety   . Hypertension   . S/P CABG x 4     Arliss Journey, PT, DPT  09/15/2020, 5:36 PM  [facility] [address] [place], [place], [address] Phone: [phone]   Fax:  [phone]  Name: ZAIYDEN STROZIER MRN:  [id number] Date of Birth: Feb 28, 1941

## 2020-09-17 ENCOUNTER — Other Ambulatory Visit: Payer: Self-pay

## 2020-09-17 ENCOUNTER — Encounter: Payer: Medicare HMO | Attending: Physical Medicine & Rehabilitation | Admitting: Physical Medicine & Rehabilitation

## 2020-09-17 ENCOUNTER — Encounter: Payer: Self-pay | Admitting: Physical Medicine & Rehabilitation

## 2020-09-17 VITALS — BP 136/83 | HR 86 | Temp 98.9°F | Ht 69.0 in | Wt 205.0 lb

## 2020-09-17 DIAGNOSIS — R269 Unspecified abnormalities of gait and mobility: Secondary | ICD-10-CM | POA: Insufficient documentation

## 2020-09-17 DIAGNOSIS — I69398 Other sequelae of cerebral infarction: Secondary | ICD-10-CM | POA: Diagnosis not present

## 2020-09-17 NOTE — Patient Instructions (Signed)
Keep up with home exercise program  

## 2020-09-17 NOTE — Progress Notes (Signed)
Subjective:    Patient ID: Shane Espinoza, male    DOB: [birth year], 80 y.o.   MRN: [id number] 79 y.o. male with history of CF, CAD s/p CABG, CVA, A. fib-on Xarelto, recent nondisplaced left lateral malleolus fracture secondary to fall 07/08/20 who was admitted on 08/05/2020 with left-sided weakness and numbness and recurrent fall.  LLE symptoms resolved but he continued to have some weakness in left hand.  MRI of brain showed focus of restricted diffusion with acute/subacute infarct and periventricular white matter.  Dr. Leonie Man felt that stroke was due to small vessel disease and patient was educated on importance of taking medications at the same time with the largest meal of the day.  Patient continued to have limitations due to LE weakness with instability, lateral lean as well as delay in processing with STM deficits  Admit date: 08/11/2020 Discharge date: 08/26/2020  HPI  At home at wife , dressing and bathing self Has a remodeled BR with a commode chair No Right ankle pain has Ortho f/u next week Has some problems on carpeted floor at home Discussed eliminating throw rugs  Still in CAM walker right foot and ankle    Pain Inventory Average Pain 0 Pain Right Now 0 My pain is no pain  LOCATION OF PAIN  No Pain  BOWEL Number of stools per week: 7-10 Oral laxative use No  Type of laxative Colace Enema or suppository use No  History of colostomy No  Incontinent No   BLADDER Pads In and out cath, frequency N/A Able to self cath No  Bladder incontinence Yes  Frequent urination No  Leakage with coughing No  Difficulty starting stream No  Incomplete bladder emptying No    Mobility walk with assistance use a walker how many minutes can you walk? UNKNOWN ability to climb steps?  yes do you drive?  no transfers alone Do you have any goals in this area?  yes  Function retired I need assistance with the following:  meal prep, household duties and shopping Do you  have any goals in this area?  yes  Neuro/Psych bladder control problems trouble walking depression  Prior Studies Any changes since last visit?  yes New Patient  Physicians involved in your care Any changes since last visit?  no New Patient   Family History  Problem Relation Age of Onset  . CVA Father   . Heart attack Maternal Grandmother   . Heart attack Maternal Grandfather   . Heart murmur Sister   . Hypertension Son    Social History   Socioeconomic History  . Marital status: Married    Spouse name: Not on file  . Number of children: 2  . Years of education: college  . Highest education level: Not on file  Occupational History  . Occupation: retitred  Tobacco Use  . Smoking status: Never Smoker  . Smokeless tobacco: Never Used  Substance and Sexual Activity  . Alcohol use: No  . Drug use: No  . Sexual activity: Never  Other Topics Concern  . Not on file  Social History Narrative  . Not on file   Social Determinants of Health   Financial Resource Strain: Not on file  Food Insecurity: Not on file  Transportation Needs: Not on file  Physical Activity: Not on file  Stress: Not on file  Social Connections: Not on file   Past Surgical History:  Procedure Laterality Date  . CARDIAC CATHETERIZATION  03/02/2011   Recommended CABG  .  COLON SURGERY    . CORONARY ARTERY BYPASS GRAFT  03/07/2011   DR.VAN  TRIGHT  . EYE SURGERY    . LEXISCAN MYOVIEW  02/20/2012   EKG negative for ischemia, noraml study  . TRANSTHORACIC ECHOCARDIOGRAM  02/20/2012   EF 50-55%, mild-moderate concentric LVH, LA moderately dilated, moderate mitral regurg  . VASCULAR DOPPLER  03/03/2011   Nosignificant extracranial carotid artery stenosis   Past Medical History:  Diagnosis Date  . Anxiety   . Atrial fibrillation ([place])   . Biceps tendon tear    right  . CAD (coronary artery disease)    [name]  . Gout   . Hypertension   . 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   [name]   There  were no vitals taken for this visit.  Opioid Risk Score:   Fall Risk Score:  `1  Depression screen PHQ 2/9  No flowsheet data found. Review of Systems  Genitourinary:       Urine leakage  Musculoskeletal: Positive for gait problem.  Skin: Negative for wound.  Hematological: Bruises/bleeds easily.  Psychiatric/Behavioral:       Depression  All other systems reviewed and are negative.      Objective:   Physical Exam Vitals and nursing note reviewed.  Constitutional:      General: He is not in acute distress.    Appearance: He is obese.  HENT:     Head: Normocephalic and atraumatic.  Eyes:     General: No visual field deficit.    Extraocular Movements: Extraocular movements intact.     Conjunctiva/sclera: Conjunctivae normal.     Pupils: Pupils are equal, round, and reactive to light.  Neurological:     Mental Status: He is alert and oriented to person, place, and time.     Cranial Nerves: No dysarthria or facial asymmetry.     Comments: Right ankle dorsiflexor strength not tested secondary to cam walker boot. No evidence of aphasia  Psychiatric:        Mood and Affect: Mood normal.        Behavior: Behavior normal.        Thought Content: Thought content normal.        Judgment: Judgment normal.           Assessment & Plan:  #1.  Right periventricular white matter infarct, left hemiparesis has largely resolved he still has problems with balance. Continue outpatient PT and OT  2.  Right fibula fracture follow-up with orthopedics next week and will advise on cam walker boot usage.  Physical medicine rehabilitation follow-up in 6 weeks

## 2020-09-20 ENCOUNTER — Other Ambulatory Visit: Payer: Self-pay

## 2020-09-20 ENCOUNTER — Ambulatory Visit: Payer: Medicare HMO | Admitting: Physical Therapy

## 2020-09-20 ENCOUNTER — Ambulatory Visit: Payer: Medicare HMO | Admitting: Occupational Therapy

## 2020-09-20 VITALS — BP 123/82 | HR 68 | Wt 204.2 lb

## 2020-09-20 DIAGNOSIS — M6281 Muscle weakness (generalized): Secondary | ICD-10-CM

## 2020-09-20 DIAGNOSIS — R2689 Other abnormalities of gait and mobility: Secondary | ICD-10-CM

## 2020-09-20 DIAGNOSIS — R278 Other lack of coordination: Secondary | ICD-10-CM

## 2020-09-20 DIAGNOSIS — R2681 Unsteadiness on feet: Secondary | ICD-10-CM

## 2020-09-20 DIAGNOSIS — Z9181 History of falling: Secondary | ICD-10-CM | POA: Diagnosis not present

## 2020-09-20 NOTE — Therapy (Signed)
[facility] [address] [place], [place], [address] Phone: [phone]   Fax:  [phone]  Physical Therapy Treatment  Patient Details  Name: Shane Espinoza MRN: [id number] Date of Birth: [birth year] Referring Provider (PT): Letta Pate, Luanna Salk, MD (being followed by)   Encounter Date: 09/20/2020   PT End of Session - 09/20/20 1534    Visit Number 6    Number of Visits 17    Date for PT Re-Evaluation 11/28/20   written for 60 day POC   Authorization Type Aetna Medicare    PT Start Time 1446    PT Stop Time 1532    PT Time Calculation (min) 46 min    Equipment Utilized During Treatment Gait belt    Activity Tolerance Patient tolerated treatment well    Behavior During Therapy [facility] for tasks assessed/performed           Past Medical History:  Diagnosis Date  . Anxiety   . Atrial fibrillation ([place])   . Biceps tendon tear    right  . CAD (coronary artery disease)    [name]  . Gout   . Hypertension   . 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 CARDIAC CATHETERIZATION  03/02/2011   Recommended CABG  . COLON SURGERY    . CORONARY ARTERY BYPASS GRAFT  03/07/2011   DR.VAN  TRIGHT  . EYE SURGERY    . LEXISCAN MYOVIEW  02/20/2012   EKG negative for ischemia, noraml study  . TRANSTHORACIC ECHOCARDIOGRAM  02/20/2012   EF 50-55%, mild-moderate concentric LVH, LA moderately dilated, moderate mitral regurg  . VASCULAR DOPPLER  03/03/2011   Nosignificant extracranial carotid artery stenosis    Vitals:   09/20/20 1450  BP: 123/82  Pulse: 68  Weight: 204 lb 3.2 oz (92.6 kg)     Subjective Assessment - 09/20/20 1451    Subjective Requesting to be weighed today. Appointment with Dr. Letta Pate went well.    Patient is accompained by: Family member   [name]   Pertinent History CAF, CAD s/p CABG 2012, CVA 2015, A fib- on Xarelto, Severe  tricompartmental Right knee OA    Limitations Standing;Walking    How long can you walk comfortably? has been walking 20' at home with RW    Patient Stated Goals wants to be able to walk by himself.    Currently in Pain? No/denies                             [facility] Adult PT Treatment/Exercise - 09/20/20 1506      Transfers   Transfers Sit to Stand;Stand to Sit    Sit to Stand 5: Supervision    Stand to Sit 5: Supervision      Ambulation/Gait   Ambulation/Gait Yes    Ambulation/Gait Assistance 4: Min guard;5: Supervision    Ambulation/Gait Assistance Details cues at times for posture, slowed down gait speed during last lap of gait, ambulated an additional 2 x 40'   Ambulation Distance (Feet) 345 Feet    Assistive device Rolling walker    Gait Pattern Decreased step length - left;Decreased stance time - right;Decreased weight shift to right;Step-through pattern;Step-to pattern    Ambulation Surface Level;Indoor      Therapeutic Activites    Therapeutic Activities Other Therapeutic Activities    Other Therapeutic Activities Patient and  wife requesting patient to be weighed. PT ambulating with patient onto scale with RW, and subtracting weight of CAM Boot and RW with patient currently weighing 204.2 lbs.      Knee/Hip Exercises: Stretches   Passive Hamstring Stretch Both;2 reps;30 seconds    Passive Hamstring Stretch Limitations cues for proper technique      Knee/Hip Exercises: Seated   Long Arc Quad Strengthening;AROM;Both;2 sets;10 reps    Long Arc Quad Limitations reviewed from HEP, as pt had not been holding at end range, cues for proper technique. cues for posture    Abd/Adduction Limitations seated hip ABD x10 reps B (moving one leg at a time) with use of red tband      Knee/Hip Exercises: Supine   Bridges Strengthening;AROM;1 set;Both;10 reps    Bridges Limitations cues for full ROM      Knee/Hip Exercises: Sidelying   Clams 2 x 10 reps with RLE, verbal  and tactile cues for proper technique                  PT Education - 09/20/20 1534    Education Details reviewed certain technique on exercises on HEP, doing reps as prescribed on HEP (pt reports currently doing 20 reps each exercise)    Person(s) Educated Patient;Spouse    Methods Explanation;Demonstration    Comprehension Verbalized understanding;Returned demonstration            PT Short Term Goals - 09/15/20 0836      PT SHORT TERM GOAL #1   Title Pt will perform 5x sit <> stand in 32 seconds or less with UE support  in order to demo improved functional mobility. ALL STGS DUE 09/27/20    Baseline 40.13 seconds    Time 4    Period Weeks    Status New    Target Date 09/27/20      PT SHORT TERM GOAL #2   Title Pt will improve BERG to at least a 31/56 to decr fall risk.    Baseline 26/56    Time 4    Period Weeks    Status Revised      PT SHORT TERM GOAL #3   Title Pt will improve gait speed with RW to at least 1.7 ft/sec in order to demo decr fall risk.    Baseline 1.41 ft/sec with RW    Time 4    Period Weeks    Status Revised      PT SHORT TERM GOAL #4   Title Pt will ambulate at least 52' with RW with supervision in order to demo improved household mobility.    Time 4    Period Weeks    Status New      PT SHORT TERM GOAL #5   Title Pt will perform TUG in 38 seconds or less with RW in order to demo decr fall risk.    Baseline 47.06 with RW    Time 4    Period Weeks    Status New             PT Long Term Goals - 09/15/20 4854      PT LONG TERM GOAL #1   Title Pt will be independent with final HEP in order to build upon functional gains made in therapy. ALL LTGS DUE 10/25/20    Time 8    Period Weeks    Status New      PT LONG TERM GOAL #2   Title Pt will  improve BERG to at least a 37/56 to decr fall risk.    Baseline 26/56    Time 8    Period Weeks    Status Revised      PT LONG TERM GOAL #3   Title Pt will ambulate at least 300' with  LRAD with supervision over unlevel surfaces in order to demo improved community mobility.    Time 8    Period Weeks    Status New      PT LONG TERM GOAL #4   Title Pt will perform TUG in 28 seconds or less with RW vs. LRAD  in order to demo decr fall risk.    Baseline 47.06 with RW    Time 8    Period Weeks    Status New      PT LONG TERM GOAL #5   Title Pt will improve gait speed with RW to at least 2.0 ft/sec in order to demo decr fall risk.    Baseline 1.41 ft/sec with RW    Time 8    Period Weeks    Status Revised      PT LONG TERM GOAL #6   Title Pt will perform 5x sit <> stand in 25 seconds or less with UE support in order to demo improved functional mobility.    Baseline 40.13 seconds    Time 8    Period Weeks    Status New      PT LONG TERM GOAL #7   Title Pt will improve FOTO score to a 64 in order to demo improved function.    Baseline 55    Time 8    Period Weeks    Status New                 Plan - 09/20/20 1638    Clinical Impression Statement Pt able to ambulate 345' again today with RW, at times only needing supervision. Remainder of session focused on BLE strengthening and reviewed exercises on pt's HEP with pt needing cues for proper technique as pt tends to perform too quickly or with too many reps. Will continue to progress towards LTGs.    Personal Factors and Comorbidities Comorbidity 3+;Past/Current Experience    Comorbidities CAF, CAD s/p CABG 2012, CVA 2015, A fib- on Xarelto, R ankle fx    Examination-Activity Limitations Stairs;Stand;Transfers;Squat;Locomotion Level;Carry    Examination-Participation Restrictions Community Activity;Cleaning    Stability/Clinical Decision Making Evolving/Moderate complexity    Rehab Potential Good    PT Frequency 2x / week    PT Duration 8 weeks    PT Treatment/Interventions ADLs/Self Care Home Management;Gait training;DME Instruction;Functional mobility training;Therapeutic activities;Neuromuscular  re-education;Balance training;Stair training;Therapeutic exercise;Patient/family education;Passive range of motion;Vestibular;Manual techniques    PT Next Visit Plan how did ortho go? balance exercises in // bars, continue to work on LE strengthening, standing balance with decreased UE support and gait with RW. standing balance on compliant surface    PT Home Exercise Plan Access Code: [email or web address]    Consulted and Agree with Plan of Care Patient;Family member/caregiver    Family Member Consulted wife susan           Patient will benefit from skilled therapeutic intervention in order to improve the following deficits and impairments:  Abnormal gait,Decreased balance,Decreased activity tolerance,Decreased coordination,Decreased knowledge of use of DME,Decreased range of motion,Difficulty walking,Decreased strength,Hypomobility,Postural dysfunction  Visit Diagnosis: Unsteadiness on feet  Other lack of coordination  Muscle weakness (generalized)  Other abnormalities of gait and mobility  Problem List Patient Active Problem List   Diagnosis Date Noted  . Diabetes ([place]) 08/30/2020  . Ankle fracture, right 08/30/2020  . Knee pain 08/30/2020  . Stroke (cerebrum) ([place]) 08/11/2020  . Acute CVA (cerebrovascular accident) ([place]) 08/06/2020  . TIA (transient ischemic attack) 08/05/2020  . Gout 12/07/2013  . Atrial fibrillation ([place]) 12/07/2013  . Acute thrombotic stroke ([place]) 09/23/2013  . CVA (cerebral infarction) 09/23/2013  . Hyperlipidemia with target LDL less than 70 04/16/2013  . CAD (coronary artery disease)   . Anxiety   . Hypertension   . S/P CABG x 4     Arliss Journey, PT, DPT  09/20/2020, 4:40 PM  [facility] [address] [place], [place], [address] Phone: [phone]   Fax:  [phone]  Name: Shane Espinoza MRN: [id number] Date of Birth: [birth year]

## 2020-09-20 NOTE — Therapy (Signed)
[facility] [address] [place], [place], [address] Phone: [phone]   Fax:  [phone]  Occupational Therapy Treatment  Patient Details  Name: Shane Espinoza MRN: [id number] Date of Birth: 1941-02-26 Referring Provider (OT): Dr. Letta Pate   Encounter Date: 09/20/2020   OT End of Session - 09/20/20 1533    Visit Number 6    Number of Visits 9    Date for OT Re-Evaluation 09/27/20    Authorization Type Aetna MCR - VL: [place], $35 copay per day    OT Start Time 1534    OT Stop Time 1614    OT Time Calculation (min) 40 min    Activity Tolerance Patient tolerated treatment well    Behavior During Therapy [facility] for tasks assessed/performed           Past Medical History:  Diagnosis Date  . Anxiety   . Atrial fibrillation ([place])   . Biceps tendon tear    right  . CAD (coronary artery disease)    [name]  . Gout   . Hypertension   . 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 CARDIAC CATHETERIZATION  03/02/2011   Recommended CABG  . COLON SURGERY    . CORONARY ARTERY BYPASS GRAFT  03/07/2011   DR.VAN  TRIGHT  . EYE SURGERY    . LEXISCAN MYOVIEW  02/20/2012   EKG negative for ischemia, noraml study  . TRANSTHORACIC ECHOCARDIOGRAM  02/20/2012   EF 50-55%, mild-moderate concentric LVH, LA moderately dilated, moderate mitral regurg  . VASCULAR DOPPLER  03/03/2011   Nosignificant extracranial carotid artery stenosis    There were no vitals filed for this visit.   Subjective Assessment - 09/20/20 1534    Subjective  Pt reported some discomfort with theraband exercises but it has gotten better - OT recommended not doing as many reps and trying every other day with red and every other with yellow.    Patient is accompanied by: Family member   wife   Pertinent History Rt CVA 08/05/20, ankle fx 07/07/20 from fall. PMH: CAF, CAD s/p CABG 2012, CVA 2015 (completed  resolved), HTN, A-fib,    Limitations CAM boot, WBAT, No driving, no heavy lifting (old Rt biceps rupture)    Currently in Pain? No/denies               TREATMENT  Standing Tolerance: pt stood for 12 minutes while completing activities - static standing with SBA. Pt completed resistance clothespins 1-8# with LUE and groved peg board while in standing  Dynamic Standing/reaching: while in standing with rolling walker, patient reached for cones at varying heights and outside of midline for increase in overall standing balance. Pt stood with supervision this day for activity.                       OT Long Term Goals - 09/20/20 1543      OT LONG TERM GOAL #1   Title Independent with UB strengthening HEP and Lt hand coordination HEP    Time 4    Period Weeks    Status Achieved      OT LONG TERM GOAL #2   Title Pt to perform toilet and shower transfers consistently w/ supervision only    Time 4    Period Weeks    Status Achieved      OT LONG TERM GOAL #  3   Title Pt to stand to perform clothes management after toileting mod I level    Time 4    Period Weeks    Status Achieved   increased time and mod I level - 09/13/20     OT LONG TERM GOAL #4   Title Pt to improve coordination Lt hand as evidenced by performing 9 hole peg test in 35 sec or less    Baseline 39.94 sec    Time 4    Period Weeks    Status On-going      OT LONG TERM GOAL #5   Title Pt to perform dynamic standing task w/ countertop support prn for 10 min w/o rest    Time 4    Period Weeks    Status On-going                 Plan - 09/20/20 1544    Clinical Impression Statement Pt has met 3/5 STGs and is progressing towards remaining goals.    OT Occupational Profile and History Problem Focused Assessment - Including review of records relating to presenting problem    Occupational performance deficits (Please refer to evaluation for details): ADL's;IADL's;Social Participation    Body  Structure / Function / Physical Skills Balance;Strength;IADL;Mobility;Decreased knowledge of precautions;FMC;Coordination;ADL    Rehab Potential Good    Clinical Decision Making Several treatment options, min-mod task modification necessary    Comorbidities Affecting Occupational Performance: Presence of comorbidities impacting occupational performance    Comorbidities impacting occupational performance description: Rt ankle fx    Modification or Assistance to Complete Evaluation  No modification of tasks or assist necessary to complete eval    OT Frequency 2x / week    OT Duration 4 weeks   plus eval   OT Treatment/Interventions Self-care/ADL training;DME and/or AE instruction;Therapeutic activities;Therapeutic exercise;Coping strategies training;Functional Mobility Training;Neuromuscular education;Passive range of motion;Patient/family education;Energy conservation    Plan continue dynamic standing/standing tolerance and progress towards goals    Consulted and Agree with Plan of Care Patient;Family member/caregiver    Family Member Consulted wife           Patient will benefit from skilled therapeutic intervention in order to improve the following deficits and impairments:   Body Structure / Function / Physical Skills: Balance,Strength,IADL,Mobility,Decreased knowledge of precautions,FMC,Coordination,ADL       Visit Diagnosis: Unsteadiness on feet  Other lack of coordination  Muscle weakness (generalized)  Other abnormalities of gait and mobility    Problem List Patient Active Problem List   Diagnosis Date Noted  . Diabetes ([place]) 08/30/2020  . Ankle fracture, right 08/30/2020  . Knee pain 08/30/2020  . Stroke (cerebrum) ([place]) 08/11/2020  . Acute CVA (cerebrovascular accident) ([place]) 08/06/2020  . TIA (transient ischemic attack) 08/05/2020  . Gout 12/07/2013  . Atrial fibrillation ([place]) 12/07/2013  . Acute thrombotic stroke ([place]) 09/23/2013  . CVA (cerebral infarction)  09/23/2013  . Hyperlipidemia with target LDL less than 70 04/16/2013  . CAD (coronary artery disease)   . Anxiety   . Hypertension   . S/P CABG x 4     Zachery Conch [place], OTR/L  09/20/2020, 4:59 PM  [facility] [address] [place], [place], [address] Phone: [phone]   Fax:  [phone]  Name: RUDRANSH BELLANCA MRN: [id number] Date of Birth: 1940/09/07

## 2020-09-21 DIAGNOSIS — M1711 Unilateral primary osteoarthritis, right knee: Secondary | ICD-10-CM | POA: Diagnosis not present

## 2020-09-21 DIAGNOSIS — M25571 Pain in right ankle and joints of right foot: Secondary | ICD-10-CM | POA: Diagnosis not present

## 2020-09-22 ENCOUNTER — Other Ambulatory Visit: Payer: Self-pay

## 2020-09-22 ENCOUNTER — Ambulatory Visit: Payer: Medicare HMO | Admitting: Occupational Therapy

## 2020-09-22 ENCOUNTER — Encounter: Payer: Self-pay | Admitting: Physical Therapy

## 2020-09-22 ENCOUNTER — Ambulatory Visit: Payer: Medicare HMO | Admitting: Physical Therapy

## 2020-09-22 DIAGNOSIS — R278 Other lack of coordination: Secondary | ICD-10-CM

## 2020-09-22 DIAGNOSIS — R2689 Other abnormalities of gait and mobility: Secondary | ICD-10-CM | POA: Diagnosis not present

## 2020-09-22 DIAGNOSIS — M6281 Muscle weakness (generalized): Secondary | ICD-10-CM | POA: Diagnosis not present

## 2020-09-22 DIAGNOSIS — R2681 Unsteadiness on feet: Secondary | ICD-10-CM | POA: Diagnosis not present

## 2020-09-22 DIAGNOSIS — Z9181 History of falling: Secondary | ICD-10-CM | POA: Diagnosis not present

## 2020-09-22 NOTE — Therapy (Signed)
[facility] [address] [place], [place], [address] Phone: [phone]   Fax:  [phone]  Occupational Therapy Treatment  Patient Details  Name: Shane Espinoza MRN: [id number] Date of Birth: February 07, 1941 Referring Provider (OT): Dr. Letta Pate   Encounter Date: 09/22/2020   OT End of Session - 09/22/20 1452    Visit Number 7    Number of Visits 9    Date for OT Re-Evaluation 09/27/20    Authorization Type Aet[employer] MCR - VL: [place], $35 copay per day    OT Start Time 1400    OT Stop Time 1445    OT Time Calculation (min) 45 min    Activity Tolerance Patient tolerated treatment well    Behavior During Therapy [facility] for tasks assessed/performed           Past Medical History:  Diagnosis Date  . Anxiety   . Atrial fibrillation ([place])   . Biceps tendon tear    right  . CAD (coronary artery disease)    [name]  . Gout   . Hypertension   . 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 CARDIAC CATHETERIZATION  03/02/2011   Recommended CABG  . COLON SURGERY    . CORONARY ARTERY BYPASS GRAFT  03/07/2011   DR.VAN  TRIGHT  . EYE SURGERY    . LEXISCAN MYOVIEW  02/20/2012   EKG negative for ischemia, noraml study  . TRANSTHORACIC ECHOCARDIOGRAM  02/20/2012   EF 50-55%, mild-moderate concentric LVH, LA moderately dilated, moderate mitral regurg  . VASCULAR DOPPLER  03/03/2011   Nosignificant extracranial carotid artery stenosis    There were no vitals filed for this visit.   Subjective Assessment - 09/22/20 1404    Subjective  I got the boot off and got the steroid injection in my knee yesterday. Pt walked in today without wheelchair    Patient is accompanied by: Family member   wife   Pertinent History Rt CVA 08/05/20, ankle fx 07/07/20 from fall. PMH: CAF, CAD s/p CABG 2012, CVA 2015 (completed resolved), HTN, A-fib,    Limitations CAM boot, WBAT, No driving, no heavy  lifting (old Rt biceps rupture)    Currently in Pain? No/denies           CAM boot d/c and pt walked into clinic today w/ walker (w/o wheelchair).  Pt standing on compliant surface while performing functional contralateral and ipsilateral reaching tasks reaching minimally outside BOS. Therapist used gait belt and either hand held assist or counter top support - pt had LOB x 2.  Side stepping bilaterally along counter w/ 2 hand support on counter in prep for functional tasks w/ close sup, followed by forwards/backwards walking along counter with one hand counter support and CGA to min assist from therapist prn.  Pt required 2 rest breaks for above tasks. Finished by working on [employer] coordination in seated position (as pt has P.T. session following) placing small pegs in pegboard then removing up to 4 at a time for in hand manipulation                          OT Long Term Goals - 09/20/20 1543      OT LONG TERM GOAL #1   Title Independent with UB strengthening HEP and Lt hand coordination HEP    Time 4    Period Weeks  Status Achieved      OT LONG TERM GOAL #2   Title Pt to perform toilet and shower transfers consistently w/ supervision only    Time 4    Period Weeks    Status Achieved      OT LONG TERM GOAL #3   Title Pt to stand to perform clothes management after toileting mod I level    Time 4    Period Weeks    Status Achieved   increased time and mod I level - 09/13/20     OT LONG TERM GOAL #4   Title Pt to improve coordination Lt hand as evidenced by performing 9 hole peg test in 35 sec or less    Baseline 39.94 sec    Time 4    Period Weeks    Status On-going      OT LONG TERM GOAL #5   Title Pt to perform dynamic standing task w/ countertop support prn for 10 min w/o rest    Time 4    Period Weeks    Status On-going                 Plan - 09/22/20 1452    Clinical Impression Statement Pt progressing with dynamic standing with  hand support. Pt w/ occasional LOB and Rt knee buckling (used gait belt and either min assist or countertop support)    OT Occupational Profile and History Problem Focused Assessment - Including review of records relating to presenting problem    Occupational performance deficits (Please refer to evaluation for details): ADL's;IADL's;Social Participation    Body Structure / Function / Physical Skills Balance;Strength;IADL;Mobility;Decreased knowledge of precautions;FMC;Coordination;ADL    Rehab Potential Good    Clinical Decision Making Several treatment options, min-mod task modification necessary    Comorbidities Affecting Occupational Performance: Presence of comorbidities impacting occupational performance    Comorbidities impacting occupational performance description: Rt ankle fx    Modification or Assistance to Complete Evaluation  No modification of tasks or assist necessary to complete eval    OT Frequency 2x / week    OT Duration 4 weeks   plus eval   OT Treatment/Interventions Self-care/ADL training;DME and/or AE instruction;Therapeutic activities;Therapeutic exercise;Coping strategies training;Functional Mobility Training;Neuromuscular education;Passive range of motion;Patient/family education;Energy conservation    Plan practice dynamic standing and functional mobility for kitchen tasks (getting snack/drink)    Consulted and Agree with Plan of Care Patient;Family member/caregiver    Family Member Consulted wife           Patient will benefit from skilled therapeutic intervention in order to improve the following deficits and impairments:   Body Structure / Function / Physical Skills: Balance,Strength,IADL,Mobility,Decreased knowledge of precautions,FMC,Coordination,ADL       Visit Diagnosis: Unsteadiness on feet  Muscle weakness (generalized)    Problem List Patient Active Problem List   Diagnosis Date Noted  . Diabetes ([place]) 08/30/2020  . Ankle fracture, right  08/30/2020  . Knee pain 08/30/2020  . 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 Acute CVA (cerebrovascular accident) ([place]) 08/06/2020  . TIA (transient ischemic attack) 08/05/2020  . Gout 12/07/2013  . Atrial fibrillation ([place]) 12/07/2013  . Acute thrombotic stroke ([place]) 09/23/2013  . CVA (cerebral infarction) 09/23/2013  . Hyperlipidemia with target LDL less than 70 04/16/2013  . CAD (coronary artery disease)   . Anxiety   . Hypertension   . S/P CABG x 4     Carey Bullocks, OTR/L 09/22/2020, 2:55 PM  [facility]  [place], [place], [address] Phone: [phone]   Fax:  [phone]  Name: Shane Espinoza MRN: [id number] Date of Birth: 12-21-1940

## 2020-09-23 NOTE — Therapy (Addendum)
[facility] [address] [place], [place], [address] Phone: [phone]   Fax:  [phone]  Physical Therapy Treatment  Patient Details  Name: Shane Espinoza MRN: [id number] Date of Birth: 12-26-1940 Referring Provider (PT): Letta Pate, Luanna Salk, MD (being followed by)   Encounter Date: 09/22/2020   PT End of Session - 09/23/20 1207    Visit Number 7    Number of Visits 17    Date for PT Re-Evaluation 11/28/20   written for 60 day POC   Authorization Type Aetna Medicare    PT Start Time 1448    PT Stop Time 1531    PT Time Calculation (min) 43 min    Equipment Utilized During Treatment Gait belt    Activity Tolerance Patient tolerated treatment well    Behavior During Therapy [facility] for tasks assessed/performed           Past Medical History:  Diagnosis Date  . Anxiety   . Atrial fibrillation ([place])   . Biceps tendon tear    right  . CAD (coronary artery disease)    [name]  . Gout   . Hypertension   . 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 CARDIAC CATHETERIZATION  03/02/2011   Recommended CABG  . COLON SURGERY    . CORONARY ARTERY BYPASS GRAFT  03/07/2011   DR.VAN  TRIGHT  . EYE SURGERY    . LEXISCAN MYOVIEW  02/20/2012   EKG negative for ischemia, noraml study  . TRANSTHORACIC ECHOCARDIOGRAM  02/20/2012   EF 50-55%, mild-moderate concentric LVH, LA moderately dilated, moderate mitral regurg  . VASCULAR DOPPLER  03/03/2011   Nosignificant extracranial carotid artery stenosis    There were no vitals filed for this visit.   Subjective Assessment - 09/22/20 1452    Subjective Got his CAM boot off. Is now full weight bearing on his right leg, and states that he can do any exercises for his ankle. Just has to wear an ankle support brace now. Got a shot in his right knee. Walked into the clinic today with his RW.    Patient is accompained by:  Family member   [name]   Pertinent History CAF, CAD s/p CABG 2012, CVA 2015, A fib- on Xarelto, Severe tricompartmental Right knee OA    Limitations Standing;Walking    How long can you walk comfortably? has been walking 20' at home with RW    Patient Stated Goals wants to be able to walk by himself.    Currently in Pain? No/denies                             [facility] Adult PT Treatment/Exercise - 09/23/20 1209      Transfers   Transfers Sit to Stand;Stand to Sit    Sit to Stand 5: Supervision    Sit to Stand Details (indicate cue type and reason) cues for proper UE placement at times    Stand to Sit 5: Supervision      Ambulation/Gait   Ambulation/Gait Yes    Ambulation/Gait Assistance 5: Supervision    Ambulation/Gait Assistance Details clinic distances with RW, pt no longer needing to wearing R CAM BOOT. pt demonstrating good stance time on RLE and LLE step length    Assistive device Rolling walker    Gait Pattern Decreased step length - left;Decreased  stance time - right;Decreased weight shift to right;Step-through pattern;Step-to pattern    Ambulation Surface Level;Indoor      Exercises   Exercises Other Exercises    Other Exercises  reviewed ankle alphabet exercise with pt (previously added to pt's HEP), cued pt to move from ankle vs. from leg. then attempted standing calf stretch with UE support at bars, pt with incr difficulty sequencing and performing properly, instead performed seated at edge of mat with use of gait belt around ankle 3 x 30 seconds, pt tolerated this stretch much better - added to HEP               Balance Exercises - 09/22/20 1527      Balance Exercises: Standing   Rockerboard Lateral;EO;Limitations    Rockerboard Limitations working on weight shifting side to side x12 reps with BUE support, needing cues for technique and for weight shift and for posture/glute activation. then performed static standing balance 2 x 30 seconds.  needing to get off afterwards as pt reporting he felt like his R knee felt like it would give way    Step Ups Forward;4 inch;UE support 2;Limitations    Step Ups Limitations x10 reps each leg, incr difficulty performing with RLE    Other Standing Exercises alternating step taps to 6" step x12 reps B with single UE support for SLS and hip/knee flexion strengthening    Other Standing Exercises Comments on blue air ex: x3 reps sit <> stands at end of mat table with RW anteriorly, cues for tall posture and glute activation in standing             PT Education - 09/23/20 1207    Education Details addition of calf stretch to HEP, reviewed ankle alphabet exercises, showed pt's spouse how to properly strap ankle brace and wife able to demo understanding    Person(s) Educated Patient;Spouse    Methods Explanation;Demonstration;Handout    Comprehension Verbalized understanding;Returned demonstration            PT Short Term Goals - 09/15/20 0836      PT SHORT TERM GOAL #1   Title Pt will perform 5x sit <> stand in 32 seconds or less with UE support  in order to demo improved functional mobility. ALL STGS DUE 09/27/20    Baseline 40.13 seconds    Time 4    Period Weeks    Status New    Target Date 09/27/20      PT SHORT TERM GOAL #2   Title Pt will improve BERG to at least a 31/56 to decr fall risk.    Baseline 26/56    Time 4    Period Weeks    Status Revised      PT SHORT TERM GOAL #3   Title Pt will improve gait speed with RW to at least 1.7 ft/sec in order to demo decr fall risk.    Baseline 1.41 ft/sec with RW    Time 4    Period Weeks    Status Revised      PT SHORT TERM GOAL #4   Title Pt will ambulate at least 70' with RW with supervision in order to demo improved household mobility.    Time 4    Period Weeks    Status New      PT SHORT TERM GOAL #5   Title Pt will perform TUG in 38 seconds or less with RW in order to demo decr fall risk.  Baseline 47.06 with RW     Time 4    Period Weeks    Status New             PT Long Term Goals - 09/15/20 4081      PT LONG TERM GOAL #1   Title Pt will be independent with final HEP in order to build upon functional gains made in therapy. ALL LTGS DUE 10/25/20    Time 8    Period Weeks    Status New      PT LONG TERM GOAL #2   Title Pt will improve BERG to at least a 37/56 to decr fall risk.    Baseline 26/56    Time 8    Period Weeks    Status Revised      PT LONG TERM GOAL #3   Title Pt will ambulate at least 300' with LRAD with supervision over unlevel surfaces in order to demo improved community mobility.    Time 8    Period Weeks    Status New      PT LONG TERM GOAL #4   Title Pt will perform TUG in 28 seconds or less with RW vs. LRAD  in order to demo decr fall risk.    Baseline 47.06 with RW    Time 8    Period Weeks    Status New      PT LONG TERM GOAL #5   Title Pt will improve gait speed with RW to at least 2.0 ft/sec in order to demo decr fall risk.    Baseline 1.41 ft/sec with RW    Time 8    Period Weeks    Status Revised      PT LONG TERM GOAL #6   Title Pt will perform 5x sit <> stand in 25 seconds or less with UE support in order to demo improved functional mobility.    Baseline 40.13 seconds    Time 8    Period Weeks    Status New      PT LONG TERM GOAL #7   Title Pt will improve FOTO score to a 64 in order to demo improved function.    Baseline 55    Time 8    Period Weeks    Status New                 Plan - 09/23/20 1214    Clinical Impression Statement Pt got R CAM boot removed after seeing the orthopedist the  other day and walked into session with RW. Reviewed ankle exercises from HEP and calf stretch for pt to perform at home. Pt tolerated session well with focus on standing balance strategies and weight shifting, however did report feeling like his R knee felt like it was going to give out on the rockerboard. Will continue to progress towards LTGs.     Personal Factors and Comorbidities Comorbidity 3+;Past/Current Experience    Comorbidities CAF, CAD s/p CABG 2012, CVA 2015, A fib- on Xarelto, R ankle fx    Examination-Activity Limitations Stairs;Stand;Transfers;Squat;Locomotion Level;Carry    Examination-Participation Restrictions Community Activity;Cleaning    Stability/Clinical Decision Making Evolving/Moderate complexity    Rehab Potential Good    PT Frequency 2x / week    PT Duration 8 weeks    PT Treatment/Interventions ADLs/Self Care Home Management;Gait training;DME Instruction;Functional mobility training;Therapeutic activities;Neuromuscular re-education;Balance training;Stair training;Therapeutic exercise;Patient/family education;Passive range of motion;Vestibular;Manual techniques    PT Next Visit Plan [employer]. balance exercises in //  bars, continue to work on LE strengthening (and ankle strength/ROM), standing balance with decreased UE support and gait with RW. standing balance on compliant surface    PT Home Exercise Plan Access Code: [email or web address]    Consulted and Agree with Plan of Care Patient;Family member/caregiver    Family Member Consulted wife susan           Patient will benefit from skilled therapeutic intervention in order to improve the following deficits and impairments:  Abnormal gait,Decreased balance,Decreased activity tolerance,Decreased coordination,Decreased knowledge of use of DME,Decreased range of motion,Difficulty walking,Decreased strength,Hypomobility,Postural dysfunction  Visit Diagnosis: Unsteadiness on feet  Other lack of coordination  Muscle weakness (generalized)  Other abnormalities of gait and mobility     Problem List Patient Active Problem List   Diagnosis Date Noted  . Diabetes ([place]) 08/30/2020  . Ankle fracture, right 08/30/2020  . Knee pain 08/30/2020  . Stroke (cerebrum) ([place]) 08/11/2020  . Acute CVA (cerebrovascular accident) ([place]) 08/06/2020  . TIA (transient ischemic  attack) 08/05/2020  . Gout 12/07/2013  . Atrial fibrillation ([place]) 12/07/2013  . Acute thrombotic stroke ([place]) 09/23/2013  . CVA (cerebral infarction) 09/23/2013  . Hyperlipidemia with target LDL less than 70 04/16/2013  . CAD (coronary artery disease)   . Anxiety   . Hypertension   . S/P CABG x 4     Arliss Journey, PT, DPT  09/23/2020, 12:19 PM  [facility] [address] [place], [place], [address] Phone: [phone]   Fax:  [phone]  Name: Shane Espinoza MRN: [id number] Date of Birth: 07-09-1940

## 2020-09-27 ENCOUNTER — Encounter: Payer: Self-pay | Admitting: Physical Therapy

## 2020-09-27 ENCOUNTER — Ambulatory Visit: Payer: Medicare HMO | Admitting: Physical Therapy

## 2020-09-27 ENCOUNTER — Ambulatory Visit: Payer: Medicare HMO | Admitting: Occupational Therapy

## 2020-09-27 ENCOUNTER — Other Ambulatory Visit: Payer: Self-pay

## 2020-09-27 ENCOUNTER — Other Ambulatory Visit: Payer: Self-pay | Admitting: Cardiovascular Disease

## 2020-09-27 DIAGNOSIS — R2681 Unsteadiness on feet: Secondary | ICD-10-CM

## 2020-09-27 DIAGNOSIS — M6281 Muscle weakness (generalized): Secondary | ICD-10-CM

## 2020-09-27 DIAGNOSIS — Z9181 History of falling: Secondary | ICD-10-CM | POA: Diagnosis not present

## 2020-09-27 DIAGNOSIS — R2689 Other abnormalities of gait and mobility: Secondary | ICD-10-CM

## 2020-09-27 DIAGNOSIS — R278 Other lack of coordination: Secondary | ICD-10-CM

## 2020-09-27 NOTE — Therapy (Signed)
[facility] [address] [place], [place], [address] Phone: [phone]   Fax:  [phone]  Physical Therapy Treatment  Patient Details  Name: Shane Espinoza MRN: [id number] Date of Birth: [birth year] Referring Provider (PT): Letta Pate, Luanna Salk, MD (being followed by)   Encounter Date: 09/27/2020   PT End of Session - 09/27/20 1507    Visit Number 8    Number of Visits 17    Date for PT Re-Evaluation 11/28/20   written for 60 day POC   Authorization Type Aetna Medicare    PT Start Time 1318    PT Stop Time 1402    PT Time Calculation (min) 44 min    Equipment Utilized During Treatment Gait belt    Activity Tolerance Patient tolerated treatment well    Behavior During Therapy [facility] for tasks assessed/performed           Past Medical History:  Diagnosis Date  . Anxiety   . Atrial fibrillation ([place])   . Biceps tendon tear    right  . CAD (coronary artery disease)    [name]  . Gout   . Hypertension   . 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 CARDIAC CATHETERIZATION  03/02/2011   Recommended CABG  . COLON SURGERY    . CORONARY ARTERY BYPASS GRAFT  03/07/2011   DR.VAN  TRIGHT  . EYE SURGERY    . LEXISCAN MYOVIEW  02/20/2012   EKG negative for ischemia, noraml study  . TRANSTHORACIC ECHOCARDIOGRAM  02/20/2012   EF 50-55%, mild-moderate concentric LVH, LA moderately dilated, moderate mitral regurg  . VASCULAR DOPPLER  03/03/2011   Nosignificant extracranial carotid artery stenosis    There were no vitals filed for this visit.   Subjective Assessment - 09/27/20 1322    Subjective Had a good weekend, walked into church and a restaurant using the walker. No falls.    Patient is accompained by: Family member   spouse Shane Espinoza   Pertinent History CAF, CAD s/p CABG 2012, CVA 2015, A fib- on Xarelto, Severe tricompartmental Right knee OA    Limitations  Standing;Walking    How long can you walk comfortably? has been walking 20' at home with RW    Patient Stated Goals wants to be able to walk by himself.    Currently in Pain? No/denies              [facility] PT Assessment - 09/27/20 1331      Timed Up and Go Test   Normal TUG (seconds) 25.25    TUG Comments with RW                         [facility] Adult PT Treatment/Exercise - 09/27/20 1331      Transfers   Transfers Sit to Stand;Stand to Sit    Sit to Stand 5: Supervision    Sit to Stand Details (indicate cue type and reason) initial cues for proper UE placement    Stand to Sit 5: Supervision      Ambulation/Gait   Ambulation/Gait Yes    Ambulation/Gait Assistance 5: Supervision    Ambulation/Gait Assistance Details pt with intermittent decr RLE stance time    Ambulation Distance (Feet) 230 Feet   plus additional clinic distances   Assistive device Rolling walker    Gait Pattern Decreased step length - left;Decreased stance  time - right;Decreased weight shift to right;Step-through pattern;Step-to pattern    Ambulation Surface Level;Indoor    Gait velocity 20.46 seconds = 1.6 ft/sec      Knee/Hip Exercises: Stretches   Press photographer Both;2 reps;30 seconds    Gastroc Stretch Limitations reviewed from HEP given at last session as pt had not been performing at home, use of strap seated at edge of mat table      Knee/Hip Exercises: Aerobic   Stepper SciFit with BLE and BUE for strengthening, ROM, and activity tolerance x5 minutes at gear 1.5               Balance Exercises - 09/27/20 0001      Balance Exercises: Standing   Sidestepping 4 reps;Upper extremity support;Limitations    Sidestepping Limitations down and back 4 reps with UE support in // bars, cues to keep toes pointed forwards, beginning with UE support and then fingertip    Heel Raises Both;10 reps;Limitations    Heel Raises Limitations alternating between RLE and LLE lifting with BUE support     Toe Raise Both;10 reps;Limitations    Toe Raise Limitations BUE support    Other Standing Exercises staggered stance weight shifting for active ankle PF/DF, x12 reps B, verbal and demo cues for proper technique, pt reporting it felt good for the ankle               PT Short Term Goals - 09/27/20 1332      PT SHORT TERM GOAL #1   Title Pt will perform 5x sit <> stand in 32 seconds or less with UE support  in order to demo improved functional mobility. ALL STGS DUE 09/27/20    Baseline 40.13 seconds    Time 4    Period Weeks    Status New    Target Date 09/27/20      PT SHORT TERM GOAL #2   Title Pt will improve BERG to at least a 31/56 to decr fall risk.    Baseline 26/56    Time 4    Period Weeks    Status Revised      PT SHORT TERM GOAL #3   Title Pt will improve gait speed with RW to at least 1.7 ft/sec in order to demo decr fall risk.    Baseline 1.41 ft/sec with RW, 20.46 seconds = 1.6 ft/sec on 09/27/20    Time 4    Period Weeks    Status Partially Met      PT SHORT TERM GOAL #4   Title Pt will ambulate at least 2' with RW with supervision in order to demo improved household mobility.    Baseline met on 09/27/20    Time 4    Period Weeks    Status Achieved      PT SHORT TERM GOAL #5   Title Pt will perform TUG in 38 seconds or less with RW in order to demo decr fall risk.    Baseline 47.06 with RW, 25.25 seconds with RW on 09/27/20    Time 4    Period Weeks    Status Achieved             PT Long Term Goals - 09/27/20 1511      PT LONG TERM GOAL #1   Title Pt will be independent with final HEP in order to build upon functional gains made in therapy. ALL LTGS DUE 10/25/20    Time 8    Period  Weeks    Status New      PT LONG TERM GOAL #2   Title Pt will improve BERG to at least a 37/56 to decr fall risk.    Baseline 26/56    Time 8    Period Weeks    Status Revised      PT LONG TERM GOAL #3   Title Pt will ambulate at least 300' with LRAD with  supervision over unlevel surfaces in order to demo improved community mobility.    Time 8    Period Weeks    Status New      PT LONG TERM GOAL #4   Title Pt will perform TUG in 20 seconds or less with RW vs. LRAD  in order to demo decr fall risk.    Baseline 47.06 with RW, 25.25 seconds on 09/27/20    Time 8    Period Weeks    Status New      PT LONG TERM GOAL #5   Title Pt will improve gait speed with RW to at least 2.0 ft/sec in order to demo decr fall risk.    Baseline 1.41 ft/sec with RW    Time 8    Period Weeks    Status Revised      PT LONG TERM GOAL #6   Title Pt will perform 5x sit <> stand in 25 seconds or less with UE support in order to demo improved functional mobility.    Baseline 40.13 seconds    Time 8    Period Weeks    Status New      PT LONG TERM GOAL #7   Title Pt will improve FOTO score to a 64 in order to demo improved function.    Baseline 55    Time 8    Period Weeks    Status New                 Plan - 09/27/20 1510    Clinical Impression Statement Began to assess pt's STGs. Pt met STG #4 and #5 in regards to walking 230' with RW and supervision. Pt improved TUG time to 25.25 seconds with RW (previously was 47.06 seconds), decr pt's risk of falls. Pt improved gait speed to 1.6 ft/sec (previously 1.4 ft/sec), but not quite to goal level. Remainder of session focused on BLE strengthening and balance with focus on active ankle PF/DF. Pt tolerated session well, will continue to progress towards LTGs.    Personal Factors and Comorbidities Comorbidity 3+;Past/Current Experience    Comorbidities CAF, CAD s/p CABG 2012, CVA 2015, A fib- on Xarelto, R ankle fx    Examination-Activity Limitations Stairs;Stand;Transfers;Squat;Locomotion Level;Carry    Examination-Participation Restrictions Community Activity;Cleaning    Stability/Clinical Decision Making Evolving/Moderate complexity    Rehab Potential Good    PT Frequency 2x / week    PT Duration 8  weeks    PT Treatment/Interventions ADLs/Self Care Home Management;Gait training;DME Instruction;Functional mobility training;Therapeutic activities;Neuromuscular re-education;Balance training;Stair training;Therapeutic exercise;Patient/family education;Passive range of motion;Vestibular;Manual techniques    PT Next Visit Plan assess remainder of STGs. SciFit. balance exercises in // bars (compliant surfaces, retro gait/side stepping and decr UE support). continue to work on LE strengthening (and ankle strength/ROM)    PT Home Exercise Plan Access Code: [email or web address]    Consulted and Agree with Plan of Care Patient;Family member/caregiver    Family Member Consulted wife susan           Patient will benefit from skilled therapeutic  intervention in order to improve the following deficits and impairments:  Abnormal gait,Decreased balance,Decreased activity tolerance,Decreased coordination,Decreased knowledge of use of DME,Decreased range of motion,Difficulty walking,Decreased strength,Hypomobility,Postural dysfunction  Visit Diagnosis: Unsteadiness on feet  Muscle weakness (generalized)  Other lack of coordination  Other abnormalities of gait and mobility     Problem List Patient Active Problem List   Diagnosis Date Noted  . Diabetes ([place]) 08/30/2020  . Ankle fracture, right 08/30/2020  . Knee pain 08/30/2020  . Stroke (cerebrum) ([place]) 08/11/2020  . Acute CVA (cerebrovascular accident) ([place]) 08/06/2020  . TIA (transient ischemic attack) 08/05/2020  . Gout 12/07/2013  . Atrial fibrillation ([place]) 12/07/2013  . Acute thrombotic stroke ([place]) 09/23/2013  . CVA (cerebral infarction) 09/23/2013  . Hyperlipidemia with target LDL less than 70 04/16/2013  . CAD (coronary artery disease)   . Anxiety   . Hypertension   . S/P CABG x 4     Arliss Journey, PT, DPT  09/27/2020, 3:12 PM  [facility] [address] [place],  [place], [address] Phone: [phone]   Fax:  [phone]  Name: ARIEN BENINCASA MRN: [id number] Date of Birth: 12/02/1940

## 2020-09-27 NOTE — Therapy (Signed)
[facility] [address] [place], [place], [address] Phone: [phone]   Fax:  [phone]  Occupational Therapy Treatment  Patient Details  Name: Shane Espinoza MRN: [id number] Date of Birth: [birth year] Referring Provider (OT): Dr. Letta Pate   Encounter Date: 09/27/2020   OT End of Session - 09/27/20 1522    Visit Number 8    Number of Visits 9    Date for OT Re-Evaluation 09/27/20    Authorization Type Aetna MCR - VL: [place], $35 copay per day    OT Start Time 1400    OT Stop Time 1445    OT Time Calculation (min) 45 min    Activity Tolerance Patient tolerated treatment well    Behavior During Therapy [facility] for tasks assessed/performed           Past Medical History:  Diagnosis Date  . Anxiety   . Atrial fibrillation ([place])   . Biceps tendon tear    right  . CAD (coronary artery disease)    [name]  . Gout   . Hypertension   . 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 CARDIAC CATHETERIZATION  03/02/2011   Recommended CABG  . COLON SURGERY    . CORONARY ARTERY BYPASS GRAFT  03/07/2011   DR.VAN  TRIGHT  . EYE SURGERY    . LEXISCAN MYOVIEW  02/20/2012   EKG negative for ischemia, noraml study  . TRANSTHORACIC ECHOCARDIOGRAM  02/20/2012   EF 50-55%, mild-moderate concentric LVH, LA moderately dilated, moderate mitral regurg  . VASCULAR DOPPLER  03/03/2011   Nosignificant extracranial carotid artery stenosis    There were no vitals filed for this visit.   Subjective Assessment - 09/27/20 1521    Subjective  Sometimes my knee gives way    Patient is accompanied by: Family member   wife   Pertinent History Rt CVA 08/05/20, ankle fx 07/07/20 from fall. PMH: CAF, CAD s/p CABG 2012, CVA 2015 (completed resolved), HTN, A-fib,    Limitations CAM boot, WBAT, No driving, no heavy lifting (old Rt biceps rupture)    Currently in Pain? No/denies            Reinforced safety techniques and fall prevention for functional standing and ambulatory tasks along counter. Practiced getting items out of high and lower cabinets, standing at sink, demo proper technique to get things out of refrigerator. Wife still doing these things at home for him, however did encourage him to stand at sink to brush teeth and practice walking w/ close supervision to get water. Discussed walker tray/basket options to transport items but pt/wife not interested.  Practiced side stepping along counter w/ 2 hand support, and forward walking along counter w/ 1 hand support and CGA with gait belt for safety.  Discussed having reacher on walker to grab light items off floor if needed.                           OT Long Term Goals - 09/20/20 1543      OT LONG TERM GOAL #1   Title Independent with UB strengthening HEP and Lt hand coordination HEP    Time 4    Period Weeks    Status Achieved      OT LONG TERM GOAL #2   Title Pt to perform toilet and shower transfers consistently w/ supervision only  Time 4    Period Weeks    Status Achieved      OT LONG TERM GOAL #3   Title Pt to stand to perform clothes management after toileting mod I level    Time 4    Period Weeks    Status Achieved   increased time and mod I level - 09/13/20     OT LONG TERM GOAL #4   Title Pt to improve coordination Lt hand as evidenced by performing 9 hole peg test in 35 sec or less    Baseline 39.94 sec    Time 4    Period Weeks    Status On-going      OT LONG TERM GOAL #5   Title Pt to perform dynamic standing task w/ countertop support prn for 10 min w/o rest    Time 4    Period Weeks    Status On-going                 Plan - 09/27/20 1523    Clinical Impression Statement Pt progressing with dynamic standing with hand support. Pt w/ occasional LOB and Rt knee buckling (used gait belt and either min assist or countertop support)    OT Occupational Profile  and History Problem Focused Assessment - Including review of records relating to presenting problem    Occupational performance deficits (Please refer to evaluation for details): ADL's;IADL's;Social Participation    Body Structure / Function / Physical Skills Balance;Strength;IADL;Mobility;Decreased knowledge of precautions;FMC;Coordination;ADL    Rehab Potential Good    Clinical Decision Making Several treatment options, min-mod task modification necessary    Comorbidities Affecting Occupational Performance: Presence of comorbidities impacting occupational performance    Comorbidities impacting occupational performance description: Rt ankle fx    Modification or Assistance to Complete Evaluation  No modification of tasks or assist necessary to complete eval    OT Frequency 2x / week    OT Duration 4 weeks   plus eval   OT Treatment/Interventions Self-care/ADL training;DME and/or AE instruction;Therapeutic activities;Therapeutic exercise;Coping strategies training;Functional Mobility Training;Neuromuscular education;Passive range of motion;Patient/family education;Energy conservation    Plan check remaining goals and possible d/c next session, pt also to bring in reacher to velcro to walker if able    Consulted and Agree with Plan of Care Patient;Family member/caregiver    Family Member Consulted wife           Patient will benefit from skilled therapeutic intervention in order to improve the following deficits and impairments:   Body Structure / Function / Physical Skills: Balance,Strength,IADL,Mobility,Decreased knowledge of precautions,FMC,Coordination,ADL       Visit Diagnosis: Unsteadiness on feet  Muscle weakness (generalized)    Problem List Patient Active Problem List   Diagnosis Date Noted  . Diabetes ([place]) 08/30/2020  . Ankle fracture, right 08/30/2020  . Knee pain 08/30/2020  . Stroke (cerebrum) ([place]) 08/11/2020  . Acute CVA (cerebrovascular accident) ([place])  08/06/2020  . TIA (transient ischemic attack) 08/05/2020  . Gout 12/07/2013  . Atrial fibrillation ([place]) 12/07/2013  . Acute thrombotic stroke ([place]) 09/23/2013  . CVA (cerebral infarction) 09/23/2013  . Hyperlipidemia with target LDL less than 70 04/16/2013  . CAD (coronary artery disease)   . Anxiety   . Hypertension   . S/P CABG x 4     Carey Bullocks, OTR/L 09/27/2020, 3:23 PM  [facility] [address] [place], [place], [address] Phone: [phone]   Fax:  [phone]  Name: Alphonsus  JANCARLO BIERMANN MRN: [id number] Date of Birth: 1940/10/21

## 2020-09-29 ENCOUNTER — Ambulatory Visit: Payer: Medicare HMO | Admitting: Occupational Therapy

## 2020-09-29 ENCOUNTER — Ambulatory Visit: Payer: Medicare HMO

## 2020-09-29 DIAGNOSIS — E782 Mixed hyperlipidemia: Secondary | ICD-10-CM | POA: Diagnosis not present

## 2020-09-29 DIAGNOSIS — I2510 Atherosclerotic heart disease of native coronary artery without angina pectoris: Secondary | ICD-10-CM | POA: Diagnosis not present

## 2020-09-29 DIAGNOSIS — I10 Essential (primary) hypertension: Secondary | ICD-10-CM | POA: Diagnosis not present

## 2020-09-29 DIAGNOSIS — I4891 Unspecified atrial fibrillation: Secondary | ICD-10-CM | POA: Diagnosis not present

## 2020-09-29 DIAGNOSIS — I6350 Cerebral infarction due to unspecified occlusion or stenosis of unspecified cerebral artery: Secondary | ICD-10-CM | POA: Diagnosis not present

## 2020-10-04 ENCOUNTER — Ambulatory Visit: Payer: Medicare HMO | Attending: Physician Assistant

## 2020-10-04 ENCOUNTER — Other Ambulatory Visit: Payer: Self-pay

## 2020-10-04 DIAGNOSIS — Z9181 History of falling: Secondary | ICD-10-CM | POA: Insufficient documentation

## 2020-10-04 DIAGNOSIS — M6281 Muscle weakness (generalized): Secondary | ICD-10-CM | POA: Diagnosis not present

## 2020-10-04 DIAGNOSIS — R2689 Other abnormalities of gait and mobility: Secondary | ICD-10-CM | POA: Diagnosis not present

## 2020-10-04 DIAGNOSIS — R2681 Unsteadiness on feet: Secondary | ICD-10-CM | POA: Insufficient documentation

## 2020-10-04 NOTE — Therapy (Addendum)
[facility] [address] [place], [place], [address] Phone: [phone]   Fax:  [phone]  Physical Therapy Treatment/Progress Note  Patient Details  Name: Shane Espinoza MRN: [id number] Date of Birth: 06-23-1941 Referring Provider (PT): Shane Espinoza Shane Salk, MD (being followed by)  Physical Therapy Progress Note   Dates of Reporting Period: 08/30/20 - 10/04/20  See Note below for Objective Data and Assessment of Progress/Goals.  Thank you for the referral of this patient. Guillermina [place], PT, DPT   Encounter Date: 10/04/2020   PT End of Session - 10/04/20 1413    Visit Number 9    Number of Visits 17    Date for PT Re-Evaluation 11/28/20   written for 60 day POC   Authorization Type [employer]    Progress Note Due on Visit 19   completed on 9th visit   PT Start Time 1318    PT Stop Time 1403    PT Time Calculation (min) 45 min    Equipment Utilized During Treatment Gait belt    Activity Tolerance Patient tolerated treatment well    Behavior During Therapy WFL for tasks assessed/performed           Past Medical History:  Diagnosis Date  . Anxiety   . Atrial fibrillation ([place])   . Biceps tendon tear    right  . CAD (coronary artery disease)    [name]  . Gout   . Hypertension   . 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 CARDIAC CATHETERIZATION  03/02/2011   Recommended CABG  . COLON SURGERY    . CORONARY ARTERY BYPASS GRAFT  03/07/2011   DR.VAN  TRIGHT  . EYE SURGERY    . LEXISCAN MYOVIEW  02/20/2012   EKG negative for ischemia, noraml study  . TRANSTHORACIC ECHOCARDIOGRAM  02/20/2012   EF 50-55%, mild-moderate concentric LVH, LA moderately dilated, moderate mitral regurg  . VASCULAR DOPPLER  03/03/2011   Nosignificant extracranial carotid artery stenosis    There were no vitals filed for this visit.   Subjective Assessment -  10/04/20 1321    Subjective Is continuing to walk with RW. No pain. No falls. Reports that he has finished occupational therapy.    Patient is accompained by: Family member   spouse Shane Espinoza   Pertinent History CAF, CAD s/p CABG 2012, CVA 2015, A fib- on Xarelto, Severe tricompartmental Right knee OA    Limitations Standing;Walking    How long can you walk comfortably? has been walking 20' at home with RW    Patient Stated Goals wants to be able to walk by himself.    Currently in Pain? No/denies             [facility] Adult PT Treatment/Exercise - 10/04/20 0001      Transfers   Transfers Sit to Stand;Stand to Sit    Sit to Stand 5: Supervision    Five time sit to stand comments  23.93 secs with UE support from standard chair    Stand to Sit 5: Supervision    Comments initially attempted completion of 5x sit <> stand from mat unable to complete due to soft surface. moved to standard chair for completion.      Ambulation/Gait   Ambulation/Gait Yes    Ambulation/Gait Assistance 5: Supervision    Ambulation/Gait Assistance Details amublation throughout therapy gym with activities    Ambulation  Distance (Feet) --   clinic distances   Assistive device Rolling walker    Gait Pattern Decreased step length - left;Decreased stance time - right;Decreased weight shift to right;Step-through pattern;Step-to pattern    Ambulation Surface Level;Indoor      Standardized Balance Assessment   Standardized Balance Assessment Berg Balance Test      Berg Balance Test   Sit to Stand Able to stand  independently using hands    Standing Unsupported Able to stand safely 2 minutes    Sitting with Back Unsupported but Feet Supported on Floor or Stool Able to sit safely and securely 2 minutes    Stand to Sit Controls descent by using hands    Transfers Able to transfer safely, definite need of hands    Standing Unsupported with Eyes Closed Able to stand 10 seconds with supervision    Standing Ubsupported with  Feet Together Able to place feet together independently and stand for 1 minute with supervision    From Standing, Reach Forward with Outstretched Arm Can reach forward >5 cm safely (2")    From Standing Position, Pick up Object from Floor Unable to pick up and needs supervision    From Standing Position, Turn to Look Behind Over each Shoulder Turn sideways only but maintains balance    Turn 360 Degrees Needs close supervision or verbal cueing    Standing Unsupported, Alternately Place Feet on Step/Stool Able to complete >2 steps/needs minimal assist    Standing Unsupported, One Foot in Front Needs help to step but can hold 15 seconds    Standing on One Leg Tries to lift leg/unable to hold 3 seconds but remains standing independently    Total Score 32      Self-Care   Other Self-Care Comments  patient requested to be weighed during session, however room with scale occupied. Will plan to weigh at next session if able.             Balance Exercises - 10/04/20 0001      Balance Exercises: Standing   Standing Eyes Opened Wide (BOA);Narrow base of support (BOS);Head turns;Solid surface;Limitations    Standing Eyes Opened Limitations in // bars: with wide BOS initially completed horizontal/vertical head turns x 10 reps. then progressed to narrow BOS x 10 reps. increased challenge with narrow BOS but able to maintain balance without UE support. PT added to HEP; educating to complete at sink and use for support as needed, as well as chair behind in case of fatigue.    Standing Eyes Closed Solid surface;Wide (BOA);2 reps;30 secs;Limitations    Standing Eyes Closed Limitations in // bars without UE support, 2 x 30 seconds.           Access Code: [email or web address] URL: [email or web address] Date: 10/04/2020 Prepared by: Baldomero Lamy  Exercises Sit to Stand - 1 x daily - 7 x weekly - 3 sets - 10 reps Seated Long Arc Quad - 1 x daily - 7 x weekly - 3 sets - 10 reps Seated March with  Resistance - 1 x daily - 7 x weekly - 3 sets - 10 reps Seated Ankle Alphabet - 1 x daily - 7 x weekly - 3 sets - 10 reps Seated Ankle Dorsiflexion with Anchored Resistance - 1 x daily - 7 x weekly - 2 sets - 10 reps Seated Ankle Plantar Flexion with Resistance Loop - 1 x daily - 7 x weekly - 2 sets - 10 reps Seated Hip Abduction with Resistance -  1 x daily - 7 x weekly - 2 sets - 10 reps Seated Calf Stretch with Strap - 2-3 x daily - 5 x weekly - 3 sets - 15-20 hold    New Additions to HEP on 10/04/20:  Romberg Stance with Head Nods - 1 x daily - 5 x weekly - 2 sets - 10 reps Romberg Stance with Head Rotation - 1 x daily - 5 x weekly - 2 sets - 10 reps      PT Education - 10/04/20 1407    Education Details balance additions to HEP (see patient instructions); progress toward STGs    Person(s) Educated Patient;Spouse    Methods Explanation;Demonstration;Handout    Comprehension Verbalized understanding;Returned demonstration            PT Short Term Goals - 10/04/20 1331      PT SHORT TERM GOAL #1   Title Pt will perform 5x sit <> stand in 32 seconds or less with UE support  in order to demo improved functional mobility. ALL STGS DUE 09/27/20    Baseline 40.13 seconds; 23.93 seconds    Time 4    Period Weeks    Status Achieved    Target Date 09/27/20      PT SHORT TERM GOAL #2   Title Pt will improve BERG to at least a 31/56 to decr fall risk.    Baseline 26/56; 32/56    Time 4    Period Weeks    Status Achieved      PT SHORT TERM GOAL #3   Title Pt will improve gait speed with RW to at least 1.7 ft/sec in order to demo decr fall risk.    Baseline 1.41 ft/sec with RW, 20.46 seconds = 1.6 ft/sec on 09/27/20    Time 4    Period Weeks    Status Partially Met      PT SHORT TERM GOAL #4   Title Pt will ambulate at least 8' with RW with supervision in order to demo improved household mobility.    Baseline met on 09/27/20    Time 4    Period Weeks    Status Achieved       PT SHORT TERM GOAL #5   Title Pt will perform TUG in 38 seconds or less with RW in order to demo decr fall risk.    Baseline 47.06 with RW, 25.25 seconds with RW on 09/27/20    Time 4    Period Weeks    Status Achieved             PT Long Term Goals - 09/27/20 1511      PT LONG TERM GOAL #1   Title Pt will be independent with final HEP in order to build upon functional gains made in therapy. ALL LTGS DUE 10/25/20    Time 8    Period Weeks    Status New      PT LONG TERM GOAL #2   Title Pt will improve BERG to at least a 37/56 to decr fall risk.    Baseline 26/56    Time 8    Period Weeks    Status Revised      PT LONG TERM GOAL #3   Title Pt will ambulate at least 300' with LRAD with supervision over unlevel surfaces in order to demo improved community mobility.    Time 8    Period Weeks    Status New      PT  LONG TERM GOAL #4   Title Pt will perform TUG in 20 seconds or less with RW vs. LRAD  in order to demo decr fall risk.    Baseline 47.06 with RW, 25.25 seconds on 09/27/20    Time 8    Period Weeks    Status New      PT LONG TERM GOAL #5   Title Pt will improve gait speed with RW to at least 2.0 ft/sec in order to demo decr fall risk.    Baseline 1.41 ft/sec with RW    Time 8    Period Weeks    Status Revised      PT LONG TERM GOAL #6   Title Pt will perform 5x sit <> stand in 25 seconds or less with UE support in order to demo improved functional mobility.    Baseline 40.13 seconds    Time 8    Period Weeks    Status New      PT LONG TERM GOAL #7   Title Pt will improve FOTO score to a 64 in order to demo improved function.    Baseline 55    Time 8    Period Weeks    Status New                 Plan - 10/04/20 1423    Clinical Impression Statement Today's skilled PT session included finishing assesment patient's progress toward STG. Patient able to meet/partially meet all STG at this time. Patient improved Berg Balance to 32/56, and improved 5x  sit <> stand to 23.93 seconds.  Rest of session focused on balance exercises, increased challenge noted with vision removed. Patient is making steady progress with PT services and will continue to benefit from skilled PT services.    Personal Factors and Comorbidities Comorbidity 3+;Past/Current Experience    Comorbidities CAF, CAD s/p CABG 2012, CVA 2015, A fib- on Xarelto, R ankle fx    Examination-Activity Limitations Stairs;Stand;Transfers;Squat;Locomotion Level;Carry    Examination-Participation Restrictions Community Activity;Cleaning    Stability/Clinical Decision Making Evolving/Moderate complexity    Rehab Potential Good    PT Frequency 2x / week    PT Duration 8 weeks    PT Treatment/Interventions ADLs/Self Care Home Management;Gait training;DME Instruction;Functional mobility training;Therapeutic activities;Neuromuscular re-education;Balance training;Stair training;Therapeutic exercise;Patient/family education;Passive range of motion;Vestibular;Manual techniques    PT Next Visit Plan Weight Patient if room open. SciFit. balance exercises in // bars (compliant surfaces, retro gait/side stepping and decr UE support). continue to work on LE strengthening (and ankle strength/ROM)    PT Home Exercise Plan Access Code: [email or web address]    Consulted and Agree with Plan of Care Patient;Family member/caregiver    Family Member Consulted wife susan           Patient will benefit from skilled therapeutic intervention in order to improve the following deficits and impairments:  Abnormal gait,Decreased balance,Decreased activity tolerance,Decreased coordination,Decreased knowledge of use of DME,Decreased range of motion,Difficulty walking,Decreased strength,Hypomobility,Postural dysfunction  Visit Diagnosis: Unsteadiness on feet  Muscle weakness (generalized)  Other abnormalities of gait and mobility  History of falling     Problem List Patient Active Problem List   Diagnosis Date Noted  .  Diabetes ([place]) 08/30/2020  . Ankle fracture, right 08/30/2020  . Knee pain 08/30/2020  . 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 Acute CVA (cerebrovascular accident) ([place]) 08/06/2020  . TIA (transient ischemic attack) 08/05/2020  . Gout 12/07/2013  . Atrial fibrillation ([place]) 12/07/2013  . Acute thrombotic stroke ([place])  09/23/2013  . CVA (cerebral infarction) 09/23/2013  . Hyperlipidemia with target LDL less than 70 04/16/2013  . CAD (coronary artery disease)   . Anxiety   . Hypertension   . S/P CABG x 4     Jones Bales, PT, DPT 10/04/2020, 2:29 PM  [facility] [address] [place], [place], [address] Phone: [phone]   Fax:  [phone]  Name: Shane Espinoza MRN: [id number] Date of Birth: 06/07/1941

## 2020-10-04 NOTE — Patient Instructions (Signed)
Access Code: [email or web address] URL: [email or web address] Date: 10/04/2020 Prepared by: Baldomero Lamy  Exercises Sit to Stand - 1 x daily - 7 x weekly - 3 sets - 10 reps Seated Long Arc Quad - 1 x daily - 7 x weekly - 3 sets - 10 reps Seated March with Resistance - 1 x daily - 7 x weekly - 3 sets - 10 reps Seated Ankle Alphabet - 1 x daily - 7 x weekly - 3 sets - 10 reps Seated Ankle Dorsiflexion with Anchored Resistance - 1 x daily - 7 x weekly - 2 sets - 10 reps Seated Ankle Plantar Flexion with Resistance Loop - 1 x daily - 7 x weekly - 2 sets - 10 reps Seated Hip Abduction with Resistance - 1 x daily - 7 x weekly - 2 sets - 10 reps Seated Calf Stretch with Strap - 2-3 x daily - 5 x weekly - 3 sets - 15-20 hold Romberg Stance with Head Nods - 1 x daily - 5 x weekly - 2 sets - 10 reps  Romberg Stance with Head Rotation - 1 x daily - 5 x weekly - 2 sets - 10 reps  Educated to complete new additions standing at kitchen sink with chair behind for safety. Use countertop for support as needed.

## 2020-10-06 ENCOUNTER — Ambulatory Visit: Payer: Medicare HMO

## 2020-10-07 ENCOUNTER — Ambulatory Visit: Payer: Medicare HMO

## 2020-10-07 ENCOUNTER — Other Ambulatory Visit: Payer: Self-pay

## 2020-10-07 DIAGNOSIS — M6281 Muscle weakness (generalized): Secondary | ICD-10-CM

## 2020-10-07 DIAGNOSIS — R2681 Unsteadiness on feet: Secondary | ICD-10-CM

## 2020-10-07 DIAGNOSIS — R2689 Other abnormalities of gait and mobility: Secondary | ICD-10-CM | POA: Diagnosis not present

## 2020-10-07 DIAGNOSIS — Z9181 History of falling: Secondary | ICD-10-CM | POA: Diagnosis not present

## 2020-10-07 NOTE — Therapy (Signed)
[facility] [address] [place], [place], [address] Phone: [phone]   Fax:  [phone]  Physical Therapy Treatment  Patient Details  Name: Shane Espinoza MRN: [id number] Date of Birth: 1940/09/02 Referring Provider (PT): Letta Pate, Luanna Salk, MD (being followed by)   Encounter Date: 10/07/2020   PT End of Session - 10/07/20 1359    Visit Number 10    Number of Visits 17    Date for PT Re-Evaluation 11/28/20   written for 60 day POC   Authorization Type [employer] Medicare    Progress Note Due on Visit 19   completed on 9th visit   PT Start Time 1400    PT Stop Time 1444    PT Time Calculation (min) 44 min    Equipment Utilized During Treatment Gait belt    Activity Tolerance Patient tolerated treatment well    Behavior During Therapy [facility] for tasks assessed/performed           Past Medical History:  Diagnosis Date  . Anxiety   . Atrial fibrillation ([place])   . Biceps tendon tear    right  . CAD (coronary artery disease)    [name]  . Gout   . Hypertension   . 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 CARDIAC CATHETERIZATION  03/02/2011   Recommended CABG  . COLON SURGERY    . CORONARY ARTERY BYPASS GRAFT  03/07/2011   DR.VAN  TRIGHT  . EYE SURGERY    . LEXISCAN MYOVIEW  02/20/2012   EKG negative for ischemia, noraml study  . TRANSTHORACIC ECHOCARDIOGRAM  02/20/2012   EF 50-55%, mild-moderate concentric LVH, LA moderately dilated, moderate mitral regurg  . VASCULAR DOPPLER  03/03/2011   Nosignificant extracranial carotid artery stenosis    There were no vitals filed for this visit.   Subjective Assessment - 10/07/20 1409    Subjective Would like to be weighed. No pain or changes to report. No falls.    Patient is accompained by: Family member   [name]   Pertinent History CAF, CAD s/p CABG 2012, CVA 2015, A fib- on Xarelto, Severe  tricompartmental Right knee OA    Limitations Standing;Walking    How long can you walk comfortably? has been walking 20' at home with RW    Patient Stated Goals wants to be able to walk by himself.    Currently in Pain? No/denies              [facility] Adult PT Treatment/Exercise - 10/07/20 0001      Transfers   Transfers Sit to Stand;Stand to Sit    Sit to Stand 5: Supervision    Stand to Sit 5: Supervision      Ambulation/Gait   Ambulation/Gait Yes    Ambulation/Gait Assistance 5: Supervision    Ambulation/Gait Assistance Details ambulation throughout session with activities. PT providing cues to reduce pushing through UE support to promote weight bearing and strength through BLE.    Ambulation Distance (Feet) --   clinic distances   Assistive device Rolling walker    Gait Pattern Decreased step length - left;Decreased stance time - right;Decreased weight shift to right;Step-through pattern;Step-to pattern    Ambulation Surface Level;Indoor      Therapeutic Activites    Therapeutic Activities Other Therapeutic Activities    Other Therapeutic Activities Patient requesting to be weighed. PT helping patient ambulate onto scale  with RW. Patient weigh is 215.4 lbs.      Exercises   Exercises Knee/Hip      Knee/Hip Exercises: Aerobic   Other Aerobic Completed SciFit with BLE/BUE for imrpoved strengthening/activity tolerance x 6 minutes on Level 2.5.               Balance Exercises - 10/07/20 0001      Balance Exercises: Standing   Standing Eyes Opened Wide (BOA);Head turns;Foam/compliant surface;Limitations    Standing Eyes Opened Limitations in // bars: with narrow BOS on airex  completed horizontal/vertical head turns 2 x 10 reps. with wide BOS completed overhead shoulder flexion x 10 reps bilaterally    Standing Eyes Closed Solid surface;Foam/compliant surface;Narrow base of support (BOS);Wide (BOA);3 reps;30 secs;Limitations    Standing Eyes Closed Limitations standing  with narrow BOS on firms urface compelted eyes closed 2 x 30 seconds; then progressed to foam with wide BOS 2 x 30 secs, intermittent eye opening and CGA    SLS with Vectors Solid surface;Upper extremity assist 1;Limitations;1 rep    SLS with Vectors Limitations completed x 15 reps to 4" step, initially starting with BLE reducing to single UE support to promote improved balance.    Rockerboard Anterior/posterior    Sidestepping Upper extremity support;4 reps;Limitations    Sidestepping Limitations down and back x 2 reps with UE support, and then progrssed to no UE support.  cues to keep toes pointed forwards. patient reluctant to bear all weight through RLE, short stance time noted.    Marching Solid surface;Upper extremity assist 2;Static;10 reps;Limitations    Marching Limitations completed x 10 reps bilaterally. cues to avoid placing pressure through UE to promote stnace on BLE, especially RLE.               PT Short Term Goals - 10/04/20 1331      PT SHORT TERM GOAL #1   Title Pt will perform 5x sit <> stand in 32 seconds or less with UE support  in order to demo improved functional mobility. ALL STGS DUE 09/27/20    Baseline 40.13 seconds; 23.93 seconds    Time 4    Period Weeks    Status Achieved    Target Date 09/27/20      PT SHORT TERM GOAL #2   Title Pt will improve BERG to at least a 31/56 to decr fall risk.    Baseline 26/56; 32/56    Time 4    Period Weeks    Status Achieved      PT SHORT TERM GOAL #3   Title Pt will improve gait speed with RW to at least 1.7 ft/sec in order to demo decr fall risk.    Baseline 1.41 ft/sec with RW, 20.46 seconds = 1.6 ft/sec on 09/27/20    Time 4    Period Weeks    Status Partially Met      PT SHORT TERM GOAL #4   Title Pt will ambulate at least 54' with RW with supervision in order to demo improved household mobility.    Baseline met on 09/27/20    Time 4    Period Weeks    Status Achieved      PT SHORT TERM GOAL #5   Title  Pt will perform TUG in 38 seconds or less with RW in order to demo decr fall risk.    Baseline 47.06 with RW, 25.25 seconds with RW on 09/27/20    Time 4    Period  Weeks    Status Achieved             PT Long Term Goals - 09/27/20 1511      PT LONG TERM GOAL #1   Title Pt will be independent with final HEP in order to build upon functional gains made in therapy. ALL LTGS DUE 10/25/20    Time 8    Period Weeks    Status New      PT LONG TERM GOAL #2   Title Pt will improve BERG to at least a 37/56 to decr fall risk.    Baseline 26/56    Time 8    Period Weeks    Status Revised      PT LONG TERM GOAL #3   Title Pt will ambulate at least 300' with LRAD with supervision over unlevel surfaces in order to demo improved community mobility.    Time 8    Period Weeks    Status New      PT LONG TERM GOAL #4   Title Pt will perform TUG in 20 seconds or less with RW vs. LRAD  in order to demo decr fall risk.    Baseline 47.06 with RW, 25.25 seconds on 09/27/20    Time 8    Period Weeks    Status New      PT LONG TERM GOAL #5   Title Pt will improve gait speed with RW to at least 2.0 ft/sec in order to demo decr fall risk.    Baseline 1.41 ft/sec with RW    Time 8    Period Weeks    Status Revised      PT LONG TERM GOAL #6   Title Pt will perform 5x sit <> stand in 25 seconds or less with UE support in order to demo improved functional mobility.    Baseline 40.13 seconds    Time 8    Period Weeks    Status New      PT LONG TERM GOAL #7   Title Pt will improve FOTO score to a 64 in order to demo improved function.    Baseline 55    Time 8    Period Weeks    Status New                 Plan - 10/07/20 1409    Clinical Impression Statement PT weighing patient at start of session, per patient's request. Continue to work toward improved strengthening/balance with patient tolerating well, conitnue to require intermittent rest breaks with activities. Patient demo  increased challenge with activites promote SLS and weight bearing through RLE with reduced UE support. Will continue to progress toward all LTGs.    Personal Factors and Comorbidities Comorbidity 3+;Past/Current Experience    Comorbidities CAF, CAD s/p CABG 2012, CVA 2015, A fib- on Xarelto, R ankle fx    Examination-Activity Limitations Stairs;Stand;Transfers;Squat;Locomotion Level;Carry    Examination-Participation Restrictions Community Activity;Cleaning    Stability/Clinical Decision Making Evolving/Moderate complexity    Rehab Potential Good    PT Frequency 2x / week    PT Duration 8 weeks    PT Treatment/Interventions ADLs/Self Care Home Management;Gait training;DME Instruction;Functional mobility training;Therapeutic activities;Neuromuscular re-education;Balance training;Stair training;Therapeutic exercise;Patient/family education;Passive range of motion;Vestibular;Manual techniques    PT Next Visit Plan Weight Patient if room open. SciFit. balance exercises in // bars (compliant surfaces, retro gait/side stepping and decr UE support). continue to work on LE strengthening (and ankle strength/ROM)    PT Home  Exercise Plan Access Code: [email or web address]    Consulted and Agree with Plan of Care Patient;Family member/caregiver    Family Member Consulted wife susan           Patient will benefit from skilled therapeutic intervention in order to improve the following deficits and impairments:  Abnormal gait,Decreased balance,Decreased activity tolerance,Decreased coordination,Decreased knowledge of use of DME,Decreased range of motion,Difficulty walking,Decreased strength,Hypomobility,Postural dysfunction  Visit Diagnosis: Unsteadiness on feet  Muscle weakness (generalized)  Other abnormalities of gait and mobility     Problem List Patient Active Problem List   Diagnosis Date Noted  . Diabetes ([place]) 08/30/2020  . Ankle fracture, right 08/30/2020  . Knee pain 08/30/2020  . Stroke  (cerebrum) ([place]) 08/11/2020  . Acute CVA (cerebrovascular accident) ([place]) 08/06/2020  . TIA (transient ischemic attack) 08/05/2020  . Gout 12/07/2013  . Atrial fibrillation ([place]) 12/07/2013  . Acute thrombotic stroke ([place]) 09/23/2013  . CVA (cerebral infarction) 09/23/2013  . Hyperlipidemia with target LDL less than 70 04/16/2013  . CAD (coronary artery disease)   . Anxiety   . Hypertension   . S/P CABG x 4     Jones Bales, PT, DPT 10/07/2020, 4:19 PM  [facility] [address] [place], [place], [address] Phone: [phone]   Fax:  [phone]  Name: Shane Espinoza MRN: [id number] Date of Birth: 10/13/1940

## 2020-10-11 ENCOUNTER — Other Ambulatory Visit: Payer: Self-pay

## 2020-10-11 ENCOUNTER — Ambulatory Visit: Payer: Medicare HMO

## 2020-10-11 DIAGNOSIS — R2681 Unsteadiness on feet: Secondary | ICD-10-CM

## 2020-10-11 DIAGNOSIS — M6281 Muscle weakness (generalized): Secondary | ICD-10-CM

## 2020-10-11 DIAGNOSIS — R2689 Other abnormalities of gait and mobility: Secondary | ICD-10-CM | POA: Diagnosis not present

## 2020-10-11 DIAGNOSIS — Z9181 History of falling: Secondary | ICD-10-CM | POA: Diagnosis not present

## 2020-10-11 NOTE — Therapy (Signed)
[facility] [address] [place], [place], [address] Phone: [phone]   Fax:  [phone]  Physical Therapy Treatment  Patient Details  Name: Shane Espinoza MRN: [id number] Date of Birth: May 31, 1941 Referring Provider (PT): Letta Pate, Luanna Salk, MD (being followed by)   Encounter Date: 10/11/2020   PT End of Session - 10/11/20 1316    Visit Number 11    Number of Visits 17    Date for PT Re-Evaluation 11/28/20   written for 60 day POC   Authorization Type [employer]    Progress Note Due on Visit 19   completed on 9th visit   PT Start Time 1316    PT Stop Time 1400    PT Time Calculation (min) 44 min    Equipment Utilized During Treatment Gait belt    Activity Tolerance Patient tolerated treatment well    Behavior During Therapy [facility] for tasks assessed/performed           Past Medical History:  Diagnosis Date  . Anxiety   . Atrial fibrillation ([place])   . Biceps tendon tear    right  . CAD (coronary artery disease)    [name]  . Gout   . Hypertension   . 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 CARDIAC CATHETERIZATION  03/02/2011   Recommended CABG  . COLON SURGERY    . CORONARY ARTERY BYPASS GRAFT  03/07/2011   DR.VAN  TRIGHT  . EYE SURGERY    . LEXISCAN MYOVIEW  02/20/2012   EKG negative for ischemia, noraml study  . TRANSTHORACIC ECHOCARDIOGRAM  02/20/2012   EF 50-55%, mild-moderate concentric LVH, LA moderately dilated, moderate mitral regurg  . VASCULAR DOPPLER  03/03/2011   Nosignificant extracranial carotid artery stenosis    There were no vitals filed for this visit.   Subjective Assessment - 10/11/20 1319    Subjective No new changes/complaints. No falls. Reports exercises are going well.    Patient is accompained by: Family member   spouse Shane Espinoza   Pertinent History CAF, CAD s/p CABG 2012, CVA 2015, A fib- on Xarelto, Severe  tricompartmental Right knee OA    Limitations Standing;Walking    How long can you walk comfortably? has been walking 20' at home with RW    Patient Stated Goals wants to be able to walk by himself.    Currently in Pain? No/denies              [facility] Adult PT Treatment/Exercise - 10/11/20 0001      Transfers   Transfers Sit to Stand;Stand to Sit    Sit to Stand 5: Supervision    Stand to Sit 5: Supervision      Ambulation/Gait   Ambulation/Gait Yes    Ambulation/Gait Assistance 5: Supervision    Ambulation/Gait Assistance Details in // bars completed ambulation with single UE support from // bar on L 3 x 10', with verbal cues for improved step length on LLE to promote stnace/weight shift to RLE. Then completed 2 x 10' without UE support, shortened step length noted due to fear avoidance. With RW, continued education on improved step length to promote equal stance time and reciprocal gait, completed x 20'.    Ambulation Distance (Feet) --   parallel bars; clinic distances   Assistive device Rolling walker    Gait Pattern Decreased step length - left;Decreased stance time - right;Decreased  weight shift to right;Step-through pattern;Step-to pattern    Ambulation Surface Level;Indoor      Exercises   Exercises Knee/Hip    Other Exercises  Completed BAPS, working toward improved DF/PF x 10 reps, INV/EVER x 10 reps, and then CW circles x 10 reps. increased challenge with DF and CW circles, requiring intermittent assist from PT for proper completion.      Knee/Hip Exercises: Stretches   Gastroc Stretch Right;3 reps;30 seconds;Limitations    Gastroc Stretch Limitations completed with towel, cues for proper completion.      Knee/Hip Exercises: Standing   Heel Raises Both;2 sets;10 reps;3 seconds;Limitations    Heel Raises Limitations with BUE support from // bars. vebral cues to avoid forward lean, props for good posture with completion to promote appropriate muscular recruitment              Balance Exercises - 10/11/20 0001      Balance Exercises: Standing   Rockerboard Lateral;Head turns;EO;Intermittent UE support;Limitations    Rockerboard Limitations standing on rockerboard positioned laterally: completed standing without UE support and completing lateral weight shift x 15 reps, working toward improved control and weight shift onto RLE. intermittent UE support required. with eyes open and holding board steady, completed horizontal/vertical head turns x 10 reps.    Retro Gait Upper extremity support;2 reps;Limitations    Retro Gait Limitations in // bars completed retro gait x 2 laps down and back, working on improved step length. light UE support    Sidestepping Upper extremity support;4 reps;Limitations    Sidestepping Limitations down and back x 2 reps without UE support. patient continue to demo reduced stance time on RLE at times, verbal cues for improvement required.             PT Education - 10/11/20 1457    Education Details continue to work on improved step length with ambulation    Person(s) Educated Patient;Spouse    Methods Explanation;Demonstration    Comprehension Verbalized understanding;Returned demonstration            PT Short Term Goals - 10/04/20 1331      PT SHORT TERM GOAL #1   Title Pt will perform 5x sit <> stand in 32 seconds or less with UE support  in order to demo improved functional mobility. ALL STGS DUE 09/27/20    Baseline 40.13 seconds; 23.93 seconds    Time 4    Period Weeks    Status Achieved    Target Date 09/27/20      PT SHORT TERM GOAL #2   Title Pt will improve BERG to at least a 31/56 to decr fall risk.    Baseline 26/56; 32/56    Time 4    Period Weeks    Status Achieved      PT SHORT TERM GOAL #3   Title Pt will improve gait speed with RW to at least 1.7 ft/sec in order to demo decr fall risk.    Baseline 1.41 ft/sec with RW, 20.46 seconds = 1.6 ft/sec on 09/27/20    Time 4    Period Weeks    Status  Partially Met      PT SHORT TERM GOAL #4   Title Pt will ambulate at least 25' with RW with supervision in order to demo improved household mobility.    Baseline met on 09/27/20    Time 4    Period Weeks    Status Achieved      PT SHORT TERM GOAL #  5   Title Pt will perform TUG in 38 seconds or less with RW in order to demo decr fall risk.    Baseline 47.06 with RW, 25.25 seconds with RW on 09/27/20    Time 4    Period Weeks    Status Achieved             PT Long Term Goals - 09/27/20 1511      PT LONG TERM GOAL #1   Title Pt will be independent with final HEP in order to build upon functional gains made in therapy. ALL LTGS DUE 10/25/20    Time 8    Period Weeks    Status New      PT LONG TERM GOAL #2   Title Pt will improve BERG to at least a 37/56 to decr fall risk.    Baseline 26/56    Time 8    Period Weeks    Status Revised      PT LONG TERM GOAL #3   Title Pt will ambulate at least 300' with LRAD with supervision over unlevel surfaces in order to demo improved community mobility.    Time 8    Period Weeks    Status New      PT LONG TERM GOAL #4   Title Pt will perform TUG in 20 seconds or less with RW vs. LRAD  in order to demo decr fall risk.    Baseline 47.06 with RW, 25.25 seconds on 09/27/20    Time 8    Period Weeks    Status New      PT LONG TERM GOAL #5   Title Pt will improve gait speed with RW to at least 2.0 ft/sec in order to demo decr fall risk.    Baseline 1.41 ft/sec with RW    Time 8    Period Weeks    Status Revised      PT LONG TERM GOAL #6   Title Pt will perform 5x sit <> stand in 25 seconds or less with UE support in order to demo improved functional mobility.    Baseline 40.13 seconds    Time 8    Period Weeks    Status New      PT LONG TERM GOAL #7   Title Pt will improve FOTO score to a 64 in order to demo improved function.    Baseline 55    Time 8    Period Weeks    Status New                 Plan - 10/11/20  1458    Clinical Impression Statement Continued balance activites working toward improved weight shift to RLE, and standing balance with reduced UE support. Patient still hesistant at times to reduce UE support. patient continue to demo shortened stance time on RLE, activites foucsed on improving this with some carryover noted at end of session. Will continue to progress toward all LTGs.    Personal Factors and Comorbidities Comorbidity 3+;Past/Current Experience    Comorbidities CAF, CAD s/p CABG 2012, CVA 2015, A fib- on Xarelto, R ankle fx    Examination-Activity Limitations Stairs;Stand;Transfers;Squat;Locomotion Level;Carry    Examination-Participation Restrictions Community Activity;Cleaning    Stability/Clinical Decision Making Evolving/Moderate complexity    Rehab Potential Good    PT Frequency 2x / week    PT Duration 8 weeks    PT Treatment/Interventions ADLs/Self Care Home Management;Gait training;DME Instruction;Functional mobility training;Therapeutic activities;Neuromuscular re-education;Balance training;Stair training;Therapeutic  exercise;Patient/family education;Passive range of motion;Vestibular;Manual techniques    PT Next Visit Plan Complete SciFit. balance exercises in // bars (compliant surfaces, retro gait/side stepping and decr UE support). continue to work on LE strengthening (and ankle strength/ROM).    PT Home Exercise Plan Access Code: [email or web address]    Consulted and Agree with Plan of Care Patient;Family member/caregiver    Family Member Consulted wife susan           Patient will benefit from skilled therapeutic intervention in order to improve the following deficits and impairments:  Abnormal gait,Decreased balance,Decreased activity tolerance,Decreased coordination,Decreased knowledge of use of DME,Decreased range of motion,Difficulty walking,Decreased strength,Hypomobility,Postural dysfunction  Visit Diagnosis: Unsteadiness on feet  Other abnormalities of gait and  mobility  Muscle weakness (generalized)     Problem List Patient Active Problem List   Diagnosis Date Noted  . Diabetes ([place]) 08/30/2020  . Ankle fracture, right 08/30/2020  . Knee pain 08/30/2020  . Stroke (cerebrum) ([place]) 08/11/2020  . Acute CVA (cerebrovascular accident) ([place]) 08/06/2020  . TIA (transient ischemic attack) 08/05/2020  . Gout 12/07/2013  . Atrial fibrillation ([place]) 12/07/2013  . Acute thrombotic stroke ([place]) 09/23/2013  . CVA (cerebral infarction) 09/23/2013  . Hyperlipidemia with target LDL less than 70 04/16/2013  . CAD (coronary artery disease)   . Anxiety   . Hypertension   . S/P CABG x 4     Jones Bales, PT, DPT 10/11/2020, 3:01 PM  [facility] [address] [place], [place], [address] Phone: [phone]   Fax:  [phone]  Name: Shane Espinoza MRN: [id number] Date of Birth: 01-23-1941

## 2020-10-13 ENCOUNTER — Ambulatory Visit: Payer: Medicare HMO | Admitting: Physical Therapy

## 2020-10-14 ENCOUNTER — Ambulatory Visit: Payer: Medicare HMO

## 2020-10-14 ENCOUNTER — Other Ambulatory Visit: Payer: Self-pay

## 2020-10-14 DIAGNOSIS — I872 Venous insufficiency (chronic) (peripheral): Secondary | ICD-10-CM | POA: Diagnosis not present

## 2020-10-14 DIAGNOSIS — L97221 Non-pressure chronic ulcer of left calf limited to breakdown of skin: Secondary | ICD-10-CM | POA: Diagnosis not present

## 2020-10-14 DIAGNOSIS — I8312 Varicose veins of left lower extremity with inflammation: Secondary | ICD-10-CM | POA: Diagnosis not present

## 2020-10-14 DIAGNOSIS — D485 Neoplasm of uncertain behavior of skin: Secondary | ICD-10-CM | POA: Diagnosis not present

## 2020-10-14 DIAGNOSIS — M6281 Muscle weakness (generalized): Secondary | ICD-10-CM

## 2020-10-14 DIAGNOSIS — Z9181 History of falling: Secondary | ICD-10-CM | POA: Diagnosis not present

## 2020-10-14 DIAGNOSIS — R2689 Other abnormalities of gait and mobility: Secondary | ICD-10-CM

## 2020-10-14 DIAGNOSIS — L308 Other specified dermatitis: Secondary | ICD-10-CM | POA: Diagnosis not present

## 2020-10-14 DIAGNOSIS — I8311 Varicose veins of right lower extremity with inflammation: Secondary | ICD-10-CM | POA: Diagnosis not present

## 2020-10-14 DIAGNOSIS — Z85828 Personal history of other malignant neoplasm of skin: Secondary | ICD-10-CM | POA: Diagnosis not present

## 2020-10-14 DIAGNOSIS — R2681 Unsteadiness on feet: Secondary | ICD-10-CM

## 2020-10-14 DIAGNOSIS — C44629 Squamous cell carcinoma of skin of left upper limb, including shoulder: Secondary | ICD-10-CM | POA: Diagnosis not present

## 2020-10-14 DIAGNOSIS — L570 Actinic keratosis: Secondary | ICD-10-CM | POA: Diagnosis not present

## 2020-10-14 NOTE — Therapy (Signed)
[facility] [address] [place], [place], [address] Phone: [phone]   Fax:  [phone]  Physical Therapy Treatment  Patient Details  Name: Shane Espinoza MRN: [id number] Date of Birth: 1940-10-16 Referring Provider (PT): Letta Pate, Luanna Salk, MD (being followed by)   Encounter Date: 10/14/2020   PT End of Session - 10/14/20 1401    Visit Number 12    Number of Visits 17    Date for PT Re-Evaluation 11/28/20   written for 60 day POC   Authorization Type [employer] Medicare    Progress Note Due on Visit 19   completed on 9th visit   PT Start Time 1401    PT Stop Time 1446    PT Time Calculation (min) 45 min    Equipment Utilized During Treatment Gait belt    Activity Tolerance Patient tolerated treatment well    Behavior During Therapy [facility] for tasks assessed/performed           Past Medical History:  Diagnosis Date  . Anxiety   . Atrial fibrillation ([place])   . Biceps tendon tear    right  . CAD (coronary artery disease)    [name]  . Gout   . Hypertension   . 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 CARDIAC CATHETERIZATION  03/02/2011   Recommended CABG  . COLON SURGERY    . CORONARY ARTERY BYPASS GRAFT  03/07/2011   DR.VAN  TRIGHT  . EYE SURGERY    . LEXISCAN MYOVIEW  02/20/2012   EKG negative for ischemia, noraml study  . TRANSTHORACIC ECHOCARDIOGRAM  02/20/2012   EF 50-55%, mild-moderate concentric LVH, LA moderately dilated, moderate mitral regurg  . VASCULAR DOPPLER  03/03/2011   Nosignificant extracranial carotid artery stenosis    There were no vitals filed for this visit.   Subjective Assessment - 10/14/20 1405    Subjective Patient reports having some blisters on his leg, Dermatologist worried about dermaitis. They have put patient on a antibiotic. No falls.    Patient is accompained by: Family member   [name]   Pertinent  History CAF, CAD s/p CABG 2012, CVA 2015, A fib- on Xarelto, Severe tricompartmental Right knee OA    Limitations Standing;Walking    How long can you walk comfortably? has been walking 20' at home with RW    Patient Stated Goals wants to be able to walk by himself.    Currently in Pain? No/denies               [facility] Adult PT Treatment/Exercise - 10/14/20 0001      Transfers   Transfers Sit to Stand;Stand to Sit    Sit to Stand 5: Supervision    Stand to Sit 5: Supervision    Comments continue to require increased UE support from lower surfaces.      Ambulation/Gait   Ambulation/Gait Yes    Ambulation/Gait Assistance 5: Supervision    Ambulation/Gait Assistance Details Completed ambulation indoors with RW x 115 ft, working on improved step length on LLE to promote improved stance time on R. intermittent verbal cues required. Then in // bars, trialed SBQC 6 x 10', with PT educating on proper sequence. Patient demo difficulty with sequencing initially requiring verbal cues and demonstration for improvements. PT educating on potential for always needing support of AD due to R knee unexpectadly buckling. One instance of buckling noted  today, but able to maintain balance with UE support and CGA from PT.    Ambulation Distance (Feet) 115 Feet   x 1, 6 x 10'   Assistive device Rolling walker;Small based quad cane    Gait Pattern Decreased step length - left;Decreased stance time - right;Decreased weight shift to right;Step-through pattern;Step-to pattern    Ambulation Surface Level;Indoor      Exercises   Exercises Knee/Hip      Knee/Hip Exercises: Aerobic   Other Aerobic Completed SciFit with BLE/BUE for improved strengthening/activity tolerance x 6 minutes on Level 3.0.      Knee/Hip Exercises: Standing   Lateral Step Up Right;2 sets;5 reps;Hand Hold: 2;Step Height: 4";Limitations    Lateral Step Up Limitations initially attempted with 6" step but unable to complete. Completed to 4"  step 2 x 5 reps, with BUE support required. Verbal cues for upright posture.    Forward Step Up Both;1 set;10 reps;Hand Hold: 2;Step Height: 6";Limitations    Forward Step Up Limitations With 6" step completed alternating forward step ups with BLE and BUE support. Verbal cues for upright posture during completion and reduced pushing through arm to promote BLE strengthening. Increased UE support required when leading with RLE.               Balance Exercises - 10/14/20 0001      Balance Exercises: Standing   Stepping Strategy Anterior;Foam/compliant surface;Limitations    Stepping Strategy Limitations standing across blue balance beam, completed alternating forward stepping strategy x 10 reps bilaterally, initially started with BUE progressing to single UE support.    Balance Beam standing across blue balance beam completed stance with feet shoulder width and no UE support completed horizontal/vertical head turns. intermittent CGA with vertical due to increased postural sway.               PT Short Term Goals - 10/04/20 1331      PT SHORT TERM GOAL #1   Title Pt will perform 5x sit <> stand in 32 seconds or less with UE support  in order to demo improved functional mobility. ALL STGS DUE 09/27/20    Baseline 40.13 seconds; 23.93 seconds    Time 4    Period Weeks    Status Achieved    Target Date 09/27/20      PT SHORT TERM GOAL #2   Title Pt will improve BERG to at least a 31/56 to decr fall risk.    Baseline 26/56; 32/56    Time 4    Period Weeks    Status Achieved      PT SHORT TERM GOAL #3   Title Pt will improve gait speed with RW to at least 1.7 ft/sec in order to demo decr fall risk.    Baseline 1.41 ft/sec with RW, 20.46 seconds = 1.6 ft/sec on 09/27/20    Time 4    Period Weeks    Status Partially Met      PT SHORT TERM GOAL #4   Title Pt will ambulate at least 76' with RW with supervision in order to demo improved household mobility.    Baseline met on  09/27/20    Time 4    Period Weeks    Status Achieved      PT SHORT TERM GOAL #5   Title Pt will perform TUG in 38 seconds or less with RW in order to demo decr fall risk.    Baseline 47.06 with RW, 25.25 seconds with RW  on 09/27/20    Time 4    Period Weeks    Status Achieved             PT Long Term Goals - 09/27/20 1511      PT LONG TERM GOAL #1   Title Pt will be independent with final HEP in order to build upon functional gains made in therapy. ALL LTGS DUE 10/25/20    Time 8    Period Weeks    Status New      PT LONG TERM GOAL #2   Title Pt will improve BERG to at least a 37/56 to decr fall risk.    Baseline 26/56    Time 8    Period Weeks    Status Revised      PT LONG TERM GOAL #3   Title Pt will ambulate at least 300' with LRAD with supervision over unlevel surfaces in order to demo improved community mobility.    Time 8    Period Weeks    Status New      PT LONG TERM GOAL #4   Title Pt will perform TUG in 20 seconds or less with RW vs. LRAD  in order to demo decr fall risk.    Baseline 47.06 with RW, 25.25 seconds on 09/27/20    Time 8    Period Weeks    Status New      PT LONG TERM GOAL #5   Title Pt will improve gait speed with RW to at least 2.0 ft/sec in order to demo decr fall risk.    Baseline 1.41 ft/sec with RW    Time 8    Period Weeks    Status Revised      PT LONG TERM GOAL #6   Title Pt will perform 5x sit <> stand in 25 seconds or less with UE support in order to demo improved functional mobility.    Baseline 40.13 seconds    Time 8    Period Weeks    Status New      PT LONG TERM GOAL #7   Title Pt will improve FOTO score to a 64 in order to demo improved function.    Baseline 55    Time 8    Period Weeks    Status New                 Plan - 10/14/20 1408    Clinical Impression Statement Continued gait training and balance/strength activities working toward improved SLS and reduced UE support. Practiced with reduced AD  including SBQC, challenge noted at times with sequencing. Will continue to progress toward all LTGs.    Personal Factors and Comorbidities Comorbidity 3+;Past/Current Experience    Comorbidities CAF, CAD s/p CABG 2012, CVA 2015, A fib- on Xarelto, R ankle fx    Examination-Activity Limitations Stairs;Stand;Transfers;Squat;Locomotion Level;Carry    Examination-Participation Restrictions Community Activity;Cleaning    Stability/Clinical Decision Making Evolving/Moderate complexity    Rehab Potential Good    PT Frequency 2x / week    PT Duration 8 weeks    PT Treatment/Interventions ADLs/Self Care Home Management;Gait training;DME Instruction;Functional mobility training;Therapeutic activities;Neuromuscular re-education;Balance training;Stair training;Therapeutic exercise;Patient/family education;Passive range of motion;Vestibular;Manual techniques    PT Next Visit Plan Complete SciFit. balance exercises in // bars (compliant surfaces, retro gait/side stepping and decr UE support). continue to work on LE strengthening (and ankle strength/ROM).    PT Home Exercise Plan Access Code: [email or web address]    Consulted and Agree with  Plan of Care Patient;Family member/caregiver    Family Member Consulted wife susan           Patient will benefit from skilled therapeutic intervention in order to improve the following deficits and impairments:  Abnormal gait,Decreased balance,Decreased activity tolerance,Decreased coordination,Decreased knowledge of use of DME,Decreased range of motion,Difficulty walking,Decreased strength,Hypomobility,Postural dysfunction  Visit Diagnosis: Unsteadiness on feet  Other abnormalities of gait and mobility  Muscle weakness (generalized)  History of falling     Problem List Patient Active Problem List   Diagnosis Date Noted  . Diabetes ([place]) 08/30/2020  . Ankle fracture, right 08/30/2020  . Knee pain 08/30/2020  . Stroke (cerebrum) ([place]) 08/11/2020  . Acute CVA  (cerebrovascular accident) ([place]) 08/06/2020  . TIA (transient ischemic attack) 08/05/2020  . Gout 12/07/2013  . Atrial fibrillation ([place]) 12/07/2013  . Acute thrombotic stroke ([place]) 09/23/2013  . CVA (cerebral infarction) 09/23/2013  . Hyperlipidemia with target LDL less than 70 04/16/2013  . CAD (coronary artery disease)   . Anxiety   . Hypertension   . S/P CABG x 4     Jones Bales, PT, DPT 10/14/2020, 3:10 PM  [facility] [address] [place], [place], [address] Phone: [phone]   Fax:  [phone]  Name: Shane Espinoza MRN: [id number] Date of Birth: [birth year]

## 2020-10-15 ENCOUNTER — Telehealth: Payer: Self-pay | Admitting: Cardiovascular Disease

## 2020-10-15 MED ORDER — FUROSEMIDE 20 MG PO TABS: 20.0000 mg | ORAL_TABLET | ORAL | 3 refills | Status: DC | PRN

## 2020-10-15 NOTE — Telephone Encounter (Signed)
Ok to resume lasix as long as edema persists

## 2020-10-15 NOTE — Telephone Encounter (Signed)
Spoke to pt's wife. She report since pt saw Dr. Claiborne Billings in November, pt fractured his right ankle in Jan and had a stroke in Feb. Pt now currently resides in a rehab facility. Wife voiced she recently visited pt and noticed multiple blisters on left lower leg. He was seen by dermatologist and prescribed some ointment which is helping. However, wife state pt has 2 + pitting edema in left foot. Wife state pt previously was on lasix but d/c at last hospital visit. Wife questioning if MD is ok with her starting lasix back to help with swelling.    Will forward to MD for recommendations.

## 2020-10-15 NOTE — Telephone Encounter (Signed)
Wife updated and verbalized understanding. New orders placed

## 2020-10-15 NOTE — Telephone Encounter (Signed)
Patient's wife is asking for a phone call from the nurse. She states that her husband had stroke on Feb 3 21 days and is still in rehab. He has 2 linking blisters on his left foot along with and has a broken right ankle. Wife thinks he needs to be back on the lasix. Please advise

## 2020-10-18 ENCOUNTER — Ambulatory Visit: Payer: Medicare HMO

## 2020-10-18 ENCOUNTER — Telehealth: Payer: Self-pay | Admitting: Cardiovascular Disease

## 2020-10-18 MED ORDER — FUROSEMIDE 20 MG PO TABS: ORAL_TABLET | ORAL | 1 refills | Status: DC

## 2020-10-18 NOTE — Telephone Encounter (Signed)
*  STAT* If patient is at the pharmacy, call can be transferred to refill team.   1. Which medications need to be refilled? (please list name of each medication and dose if known) furosemide (LASIX) 20 MG tablet  2. Which pharmacy/location (including street and city if local pharmacy) is medication to be sent to? [facility] [place], [place] [facility]  3. Do they need a 30 day or 90 day supply? 90 day supply  Script was order 10/15/20 but it was sent to the wrong pharmacy [facility]

## 2020-10-18 NOTE — Telephone Encounter (Signed)
Called the patient and spoke with his wife [name]. I informed her that the new Rx for Lasix 20 mg daily may take an extra tablet as needed for swelling or fluid. She asked that I send the [facility] to [facility] and not [facility].

## 2020-10-20 ENCOUNTER — Ambulatory Visit: Payer: Medicare HMO | Admitting: Physical Therapy

## 2020-10-21 ENCOUNTER — Ambulatory Visit: Payer: Medicare HMO

## 2020-10-21 ENCOUNTER — Other Ambulatory Visit: Payer: Self-pay

## 2020-10-21 DIAGNOSIS — M6281 Muscle weakness (generalized): Secondary | ICD-10-CM

## 2020-10-21 DIAGNOSIS — R2689 Other abnormalities of gait and mobility: Secondary | ICD-10-CM

## 2020-10-21 DIAGNOSIS — R2681 Unsteadiness on feet: Secondary | ICD-10-CM | POA: Diagnosis not present

## 2020-10-21 DIAGNOSIS — Z9181 History of falling: Secondary | ICD-10-CM | POA: Diagnosis not present

## 2020-10-21 NOTE — Therapy (Signed)
[facility] [address] [place], [place], [address] Phone: [phone]   Fax:  [phone]  Physical Therapy Treatment  Patient Details  Name: Shane Espinoza MRN: [id number] Date of Birth: [birth year] Referring Provider (PT): Letta Pate, Luanna Salk, MD (being followed by)   Encounter Date: 10/21/2020   PT End of Session - 10/21/20 1400    Visit Number 13    Number of Visits 17    Date for PT Re-Evaluation 11/28/20   written for 60 day POC   Authorization Type [employer]    Progress Note Due on Visit 19   completed on 9th visit   PT Start Time 1400    PT Stop Time 1446    PT Time Calculation (min) 46 min    Equipment Utilized During Treatment Gait belt    Activity Tolerance Patient tolerated treatment well    Behavior During Therapy [facility] for tasks assessed/performed           Past Medical History:  Diagnosis Date  . Anxiety   . Atrial fibrillation ([place])   . Biceps tendon tear    right  . CAD (coronary artery disease)    [name]  . Gout   . Hypertension   . 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 CARDIAC CATHETERIZATION  03/02/2011   Recommended CABG  . COLON SURGERY    . CORONARY ARTERY BYPASS GRAFT  03/07/2011   DR.VAN  TRIGHT  . EYE SURGERY    . LEXISCAN MYOVIEW  02/20/2012   EKG negative for ischemia, noraml study  . TRANSTHORACIC ECHOCARDIOGRAM  02/20/2012   EF 50-55%, mild-moderate concentric LVH, LA moderately dilated, moderate mitral regurg  . VASCULAR DOPPLER  03/03/2011   Nosignificant extracranial carotid artery stenosis    There were no vitals filed for this visit.   Subjective Assessment - 10/21/20 1406    Subjective Reports swelling is going down, but still has a few open blisters on the legs. No other new changes/complaints. No falls.    Patient is accompained by: Family member   [name]   Pertinent History CAF, CAD  s/p CABG 2012, CVA 2015, A fib- on Xarelto, Severe tricompartmental Right knee OA    Limitations Standing;Walking    How long can you walk comfortably? has been walking 20' at home with RW    Patient Stated Goals wants to be able to walk by himself.    Currently in Pain? No/denies               [facility] Adult PT Treatment/Exercise - 10/21/20 0001      Transfers   Transfers Sit to Stand;Stand to Sit    Sit to Stand 5: Supervision    Stand to Sit 5: Supervision    Comments completed sit <> stands from mat, 2 x 10 reps. Completed with single UE support from mat and other UE placed on RW, due to increased challenge with transition when BUE pushing from mat.  PT educating on improved forward lean with completion.      Ambulation/Gait   Ambulation/Gait Yes    Ambulation/Gait Assistance 5: Supervision    Ambulation/Gait Assistance Details Completed ambulation with SBQC in // bars, PT continue to provide verbal cues and demonstration for proper sequencing. Once improved sequencing noted, worked toward improved step length to promote improved weight shift. Continue to demo hesistancy with placing weight through RLE .  Ambulation Distance (Feet) 10 Feet   x 8   Assistive device Rolling walker;Small based quad cane    Gait Pattern Decreased step length - left;Decreased stance time - right;Decreased weight shift to right;Step-through pattern;Step-to pattern    Ambulation Surface Level;Indoor      Neuro Re-ed    Neuro Re-ed Details  with agility ladder completed x 3 laps down and back with single UE support working toward improved reciprocal stepping and B improved step length.      Exercises   Exercises Knee/Hip      Knee/Hip Exercises: Aerobic   Other Aerobic Completed SciFit with BLE/BUE for improved strengthening/activity tolerance x 6 minutes on Level 3.0.               Balance Exercises - 10/21/20 0001      Balance Exercises: Standing   SLS with Vectors Solid surface;Upper  extremity assist 1;Limitations    SLS with Vectors Limitations with orange pebble completed toe taps with LLE to pebble x 10 reps with single UE support then progressing to no UE support x 10 reps working toward improved weight shift and SLS on RLE.    Other Standing Exercises without black balance beam completed forward and backward steps with LLE x 10 reps without UE support working toward improved stance on RLE with reduced UE support. Then progressed to completing step overs black balance beam x 10, increased challenge noted requiring intermittent single and BUE support from // bars.               PT Short Term Goals - 10/04/20 1331      PT SHORT TERM GOAL #1   Title Pt will perform 5x sit <> stand in 32 seconds or less with UE support  in order to demo improved functional mobility. ALL STGS DUE 09/27/20    Baseline 40.13 seconds; 23.93 seconds    Time 4    Period Weeks    Status Achieved    Target Date 09/27/20      PT SHORT TERM GOAL #2   Title Pt will improve BERG to at least a 31/56 to decr fall risk.    Baseline 26/56; 32/56    Time 4    Period Weeks    Status Achieved      PT SHORT TERM GOAL #3   Title Pt will improve gait speed with RW to at least 1.7 ft/sec in order to demo decr fall risk.    Baseline 1.41 ft/sec with RW, 20.46 seconds = 1.6 ft/sec on 09/27/20    Time 4    Period Weeks    Status Partially Met      PT SHORT TERM GOAL #4   Title Pt will ambulate at least 47' with RW with supervision in order to demo improved household mobility.    Baseline met on 09/27/20    Time 4    Period Weeks    Status Achieved      PT SHORT TERM GOAL #5   Title Pt will perform TUG in 38 seconds or less with RW in order to demo decr fall risk.    Baseline 47.06 with RW, 25.25 seconds with RW on 09/27/20    Time 4    Period Weeks    Status Achieved             PT Long Term Goals - 09/27/20 1511      PT LONG TERM GOAL #1   Title Pt will be  independent with final HEP  in order to build upon functional gains made in therapy. ALL LTGS DUE 10/25/20    Time 8    Period Weeks    Status New      PT LONG TERM GOAL #2   Title Pt will improve BERG to at least a 37/56 to decr fall risk.    Baseline 26/56    Time 8    Period Weeks    Status Revised      PT LONG TERM GOAL #3   Title Pt will ambulate at least 300' with LRAD with supervision over unlevel surfaces in order to demo improved community mobility.    Time 8    Period Weeks    Status New      PT LONG TERM GOAL #4   Title Pt will perform TUG in 20 seconds or less with RW vs. LRAD  in order to demo decr fall risk.    Baseline 47.06 with RW, 25.25 seconds on 09/27/20    Time 8    Period Weeks    Status New      PT LONG TERM GOAL #5   Title Pt will improve gait speed with RW to at least 2.0 ft/sec in order to demo decr fall risk.    Baseline 1.41 ft/sec with RW    Time 8    Period Weeks    Status Revised      PT LONG TERM GOAL #6   Title Pt will perform 5x sit <> stand in 25 seconds or less with UE support in order to demo improved functional mobility.    Baseline 40.13 seconds    Time 8    Period Weeks    Status New      PT LONG TERM GOAL #7   Title Pt will improve FOTO score to a 64 in order to demo improved function.    Baseline 55    Time 8    Period Weeks    Status New                 Plan - 10/21/20 1410    Clinical Impression Statement Continued to work on BLE strengthening and balance activites, intermittent rest breaks required. Patient continue to demo increased challenge with sequencing of SBQC. Continued to progress toward improved SLS on RLE.    Personal Factors and Comorbidities Comorbidity 3+;Past/Current Experience    Comorbidities CAF, CAD s/p CABG 2012, CVA 2015, A fib- on Xarelto, R ankle fx    Examination-Activity Limitations Stairs;Stand;Transfers;Squat;Locomotion Level;Carry    Examination-Participation Restrictions Community Activity;Cleaning     Stability/Clinical Decision Making Evolving/Moderate complexity    Rehab Potential Good    PT Frequency 2x / week    PT Duration 8 weeks    PT Treatment/Interventions ADLs/Self Care Home Management;Gait training;DME Instruction;Functional mobility training;Therapeutic activities;Neuromuscular re-education;Balance training;Stair training;Therapeutic exercise;Patient/family education;Passive range of motion;Vestibular;Manual techniques    PT Next Visit Plan Complete SciFit. balance exercises in // bars (compliant surfaces, retro gait/side stepping and decr UE support). continue to work on LE strengthening (and ankle strength/ROM). Begin to check LTG's.    PT Home Exercise Plan Access Code: [email or web address]    Consulted and Agree with Plan of Care Patient;Family member/caregiver    Family Member Consulted wife susan           Patient will benefit from skilled therapeutic intervention in order to improve the following deficits and impairments:  Abnormal gait,Decreased balance,Decreased activity tolerance,Decreased coordination,Decreased knowledge of use  of DME,Decreased range of motion,Difficulty walking,Decreased strength,Hypomobility,Postural dysfunction  Visit Diagnosis: Unsteadiness on feet  Other abnormalities of gait and mobility  Muscle weakness (generalized)     Problem List Patient Active Problem List   Diagnosis Date Noted  . Diabetes ([place]) 08/30/2020  . Ankle fracture, right 08/30/2020  . Knee pain 08/30/2020  . Stroke (cerebrum) ([place]) 08/11/2020  . Acute CVA (cerebrovascular accident) ([place]) 08/06/2020  . TIA (transient ischemic attack) 08/05/2020  . Gout 12/07/2013  . Atrial fibrillation ([place]) 12/07/2013  . Acute thrombotic stroke ([place]) 09/23/2013  . CVA (cerebral infarction) 09/23/2013  . Hyperlipidemia with target LDL less than 70 04/16/2013  . CAD (coronary artery disease)   . Anxiety   . Hypertension   . S/P CABG x 4     Jones Bales, PT, DPT 10/21/2020, 4:31  PM  [facility] [address] [place], [place], [address] Phone: [phone]   Fax:  [phone]  Name: Shane Espinoza MRN: [id number] Date of Birth: 1940-09-22

## 2020-10-25 ENCOUNTER — Ambulatory Visit: Payer: Medicare HMO

## 2020-10-25 ENCOUNTER — Other Ambulatory Visit: Payer: Self-pay

## 2020-10-25 DIAGNOSIS — R2689 Other abnormalities of gait and mobility: Secondary | ICD-10-CM

## 2020-10-25 DIAGNOSIS — M6281 Muscle weakness (generalized): Secondary | ICD-10-CM

## 2020-10-25 DIAGNOSIS — Z9181 History of falling: Secondary | ICD-10-CM | POA: Diagnosis not present

## 2020-10-25 DIAGNOSIS — R2681 Unsteadiness on feet: Secondary | ICD-10-CM | POA: Diagnosis not present

## 2020-10-25 NOTE — Therapy (Signed)
[facility] [address] [place], [place], [address] Phone: [phone]   Fax:  [phone]  Physical Therapy Treatment  Patient Details  Name: Shane Espinoza MRN: [id number] Date of Birth: 06-03-1941 Referring Provider (PT): Letta Pate, Luanna Salk, MD (being followed by)   Encounter Date: 10/25/2020   PT End of Session - 10/25/20 1319    Visit Number 14    Number of Visits 17    Date for PT Re-Evaluation 11/28/20   written for 60 day POC   Authorization Type [employer] Medicare    Progress Note Due on Visit 19   completed on 9th visit   PT Start Time 1317    PT Stop Time 1400    PT Time Calculation (min) 43 min    Equipment Utilized During Treatment Gait belt    Activity Tolerance Patient tolerated treatment well    Behavior During Therapy [facility] for tasks assessed/performed           Past Medical History:  Diagnosis Date  . Anxiety   . Atrial fibrillation ([place])   . Biceps tendon tear    right  . CAD (coronary artery disease)    [name]  . Gout   . Hypertension   . 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 CARDIAC CATHETERIZATION  03/02/2011   Recommended CABG  . COLON SURGERY    . CORONARY ARTERY BYPASS GRAFT  03/07/2011   DR.VAN  TRIGHT  . EYE SURGERY    . LEXISCAN MYOVIEW  02/20/2012   EKG negative for ischemia, noraml study  . TRANSTHORACIC ECHOCARDIOGRAM  02/20/2012   EF 50-55%, mild-moderate concentric LVH, LA moderately dilated, moderate mitral regurg  . VASCULAR DOPPLER  03/03/2011   Nosignificant extracranial carotid artery stenosis    There were no vitals filed for this visit.   Subjective Assessment - 10/25/20 1321    Subjective Patient reports swelling is continue to go down, has been doubled dose of lasix. No falls or pain to report.    Patient is accompained by: Family member   [name]   Pertinent History CAF, CAD s/p CABG  2012, CVA 2015, A fib- on Xarelto, Severe tricompartmental Right knee OA    Limitations Standing;Walking    How long can you walk comfortably? has been walking 20' at home with RW    Patient Stated Goals wants to be able to walk by himself.    Currently in Pain? No/denies             [facility] Adult PT Treatment/Exercise - 10/25/20 0001      Transfers   Transfers Sit to Stand;Stand to Sit    Sit to Stand 5: Supervision    Five time sit to stand comments  24.18 secs with UE support from standard height chair.    Stand to Sit 5: Supervision      Ambulation/Gait   Ambulation/Gait Yes    Ambulation/Gait Assistance 5: Supervision    Ambulation/Gait Assistance Details ambulation throughout therapy session with RW    Ambulation Distance (Feet) --   clinic distances   Assistive device Rolling walker    Gait Pattern Decreased step length - left;Decreased stance time - right;Decreased weight shift to right;Step-through pattern;Step-to pattern    Ambulation Surface Level;Indoor    Gait velocity 16.94 seconds = 1.94 ft/sec      Standardized Balance Assessment   Standardized Balance Assessment  Berg Balance Test      Berg Balance Test   Sit to Stand Able to stand  independently using hands    Standing Unsupported Able to stand safely 2 minutes    Sitting with Back Unsupported but Feet Supported on Floor or Stool Able to sit safely and securely 2 minutes    Stand to Sit Controls descent by using hands    Transfers Able to transfer safely, definite need of hands    Standing Unsupported with Eyes Closed Able to stand 10 seconds with supervision    Standing Ubsupported with Feet Together Able to place feet together independently and stand 1 minute safely    From Standing, Reach Forward with Outstretched Arm Can reach forward >12 cm safely (5")    From Standing Position, Pick up Object from Floor Unable to pick up shoe, but reaches 2-5 cm (1-2") from shoe and balances independently    From Standing  Position, Turn to Look Behind Over each Shoulder Turn sideways only but maintains balance    Turn 360 Degrees Needs close supervision or verbal cueing    Standing Unsupported, Alternately Place Feet on Step/Stool Able to stand independently and complete 8 steps >20 seconds    Standing Unsupported, One Foot in Front Able to take small step independently and hold 30 seconds    Standing on One Leg Tries to lift leg/unable to hold 3 seconds but remains standing independently    Total Score 38      Timed Up and Go Test   TUG Normal TUG    Normal TUG (seconds) 23.69                  PT Education - 10/25/20 1358    Education Details progress toward LTGs    Person(s) Educated Patient    Methods Explanation    Comprehension Verbalized understanding            PT Short Term Goals - 10/04/20 1331      PT SHORT TERM GOAL #1   Title Pt will perform 5x sit <> stand in 32 seconds or less with UE support  in order to demo improved functional mobility. ALL STGS DUE 09/27/20    Baseline 40.13 seconds; 23.93 seconds    Time 4    Period Weeks    Status Achieved    Target Date 09/27/20      PT SHORT TERM GOAL #2   Title Pt will improve BERG to at least a 31/56 to decr fall risk.    Baseline 26/56; 32/56    Time 4    Period Weeks    Status Achieved      PT SHORT TERM GOAL #3   Title Pt will improve gait speed with RW to at least 1.7 ft/sec in order to demo decr fall risk.    Baseline 1.41 ft/sec with RW, 20.46 seconds = 1.6 ft/sec on 09/27/20    Time 4    Period Weeks    Status Partially Met      PT SHORT TERM GOAL #4   Title Pt will ambulate at least 47' with RW with supervision in order to demo improved household mobility.    Baseline met on 09/27/20    Time 4    Period Weeks    Status Achieved      PT SHORT TERM GOAL #5   Title Pt will perform TUG in 38 seconds or less with RW in order to demo decr fall  risk.    Baseline 47.06 with RW, 25.25 seconds with RW on 09/27/20     Time 4    Period Weeks    Status Achieved             PT Long Term Goals - 10/25/20 1340      PT LONG TERM GOAL #1   Title Pt will be independent with final HEP in order to build upon functional gains made in therapy. ALL LTGS DUE 10/25/20    Time 8    Period Weeks    Status New      PT LONG TERM GOAL #2   Title Pt will improve BERG to at least a 37/56 to decr fall risk.    Baseline 26/56; 38/56    Time 8    Period Weeks    Status Achieved      PT LONG TERM GOAL #3   Title Pt will ambulate at least 300' with LRAD with supervision over unlevel surfaces in order to demo improved community mobility.    Time 8    Period Weeks    Status New      PT LONG TERM GOAL #4   Title Pt will perform TUG in 20 seconds or less with RW vs. LRAD  in order to demo decr fall risk.    Baseline 47.06 with RW, 25.25 seconds on 09/27/20; 23.69 with RW 10/25/20    Time 8    Period Weeks    Status Not Met      PT LONG TERM GOAL #5   Title Pt will improve gait speed with RW to at least 2.0 ft/sec in order to demo decr fall risk.    Baseline 1.41 ft/sec with RW; 1.94 ft/sec with RW    Time 8    Period Weeks    Status Not Met      PT LONG TERM GOAL #6   Title Pt will perform 5x sit <> stand in 25 seconds or less with UE support in order to demo improved functional mobility.    Baseline 40.13 seconds; 24.18 secs with UE support    Time 8    Period Weeks    Status Achieved      PT LONG TERM GOAL #7   Title Pt will improve FOTO score to a 64 in order to demo improved function.    Baseline 55    Time 8    Period Weeks    Status New                 Plan - 10/25/20 1359    Clinical Impression Statement Began assesment of patient's progress toward LTGs. Patient able to meet LTG #2 and 6 today during session. Patient able to improve BERG to 38/56 demonstrating improved balance. Patient currently ambulating at 1.94 ft/sec with RW. Patient demonstrating slow steady progress with PT services  and will continue to benefit from skilled PT services.    Personal Factors and Comorbidities Comorbidity 3+;Past/Current Experience    Comorbidities CAF, CAD s/p CABG 2012, CVA 2015, A fib- on Xarelto, R ankle fx    Examination-Activity Limitations Stairs;Stand;Transfers;Squat;Locomotion Level;Carry    Examination-Participation Restrictions Community Activity;Cleaning    Stability/Clinical Decision Making Evolving/Moderate complexity    Rehab Potential Good    PT Frequency 2x / week    PT Duration 8 weeks    PT Treatment/Interventions ADLs/Self Care Home Management;Gait training;DME Instruction;Functional mobility training;Therapeutic activities;Neuromuscular re-education;Balance training;Stair training;Therapeutic exercise;Patient/family education;Passive range of motion;Vestibular;Manual techniques  PT Next Visit Plan Finish goal assesment, re-cert for 2x/week for 6 weeks. Complete SciFit. balance exercises in // bars (compliant surfaces, retro gait/side stepping and decr UE support). continue to work on LE strengthening (and ankle strength/ROM). Begin to check LTG's.    PT Home Exercise Plan Access Code: [email or web address]    Consulted and Agree with Plan of Care Patient;Family member/caregiver    Family Member Consulted wife susan           Patient will benefit from skilled therapeutic intervention in order to improve the following deficits and impairments:  Abnormal gait,Decreased balance,Decreased activity tolerance,Decreased coordination,Decreased knowledge of use of DME,Decreased range of motion,Difficulty walking,Decreased strength,Hypomobility,Postural dysfunction  Visit Diagnosis: Unsteadiness on feet  Other abnormalities of gait and mobility  Muscle weakness (generalized)  History of falling     Problem List Patient Active Problem List   Diagnosis Date Noted  . Diabetes ([place]) 08/30/2020  . Ankle fracture, right 08/30/2020  . Knee pain 08/30/2020  . Stroke (cerebrum) ([place])  08/11/2020  . Acute CVA (cerebrovascular accident) ([place]) 08/06/2020  . TIA (transient ischemic attack) 08/05/2020  . Gout 12/07/2013  . Atrial fibrillation ([place]) 12/07/2013  . Acute thrombotic stroke ([place]) 09/23/2013  . CVA (cerebral infarction) 09/23/2013  . Hyperlipidemia with target LDL less than 70 04/16/2013  . CAD (coronary artery disease)   . Anxiety   . Hypertension   . S/P CABG x 4     Jones Bales, PT, DPT 10/25/2020, 5:09 PM  [facility] [address] [place], [place], [address] Phone: [phone]   Fax:  [phone]  Name: Shane Espinoza MRN: [id number] Date of Birth: 1941/04/19

## 2020-10-26 ENCOUNTER — Encounter: Payer: Medicare HMO | Attending: Physical Medicine & Rehabilitation | Admitting: Physical Medicine & Rehabilitation

## 2020-10-26 ENCOUNTER — Encounter: Payer: Self-pay | Admitting: Physical Medicine & Rehabilitation

## 2020-10-26 VITALS — BP 138/74 | HR 92 | Temp 98.3°F | Ht 69.0 in | Wt 211.2 lb

## 2020-10-26 DIAGNOSIS — R269 Unspecified abnormalities of gait and mobility: Secondary | ICD-10-CM | POA: Insufficient documentation

## 2020-10-26 DIAGNOSIS — I69398 Other sequelae of cerebral infarction: Secondary | ICD-10-CM | POA: Insufficient documentation

## 2020-10-26 NOTE — Patient Instructions (Signed)
May try driving with wife in empty parking lot to practice

## 2020-10-26 NOTE — Progress Notes (Signed)
Subjective:    Patient ID: Shane Espinoza, male    DOB: August 30, 1940, 80 y.o.   MRN: [id number] 79 y.o.malewith history of CF, CAD s/p CABG, CVA, A. fib-on Xarelto, recent nondisplaced left lateral malleolus fracture secondary to fall01/06/22who was admitted on 08/05/2020 with left-sided weakness and numbness and recurrent fall. LLE symptoms resolved but he continued to have some weakness in left hand. MRI of brain showed focus of restricted diffusion with acute/subacute infarct and periventricular white matter. Dr. Leonie Man felt that stroke was due to small vessel disease and patient was educated on importance of taking medications at the same time with the largest meal of the day. Patient continued to have limitations due to LE weakness with instability, lateral lean as well as delay in processing with STM deficits  Admit date:08/11/2020 Discharge date:08/26/2020 HPI  Patient with several questions regarding left leg swelling diuretic treatment skin issues.  The patient has been to dermatology for open weeping areas left lower extremity and has received topical medication for this.  The patient has received a prescription for Lasix from his cardiologist and is currently taking 40 mg/day.  Wife has noted a 3 pound weight loss.  We discussed that this can be used to monitor diuresis. Has f/u appt with Ortho next week.  No longer has left leg pain.  The patient had questions about calcium supplement which was started due to reduced bone healing while patient was in total. Fell off stair lift while trying to kill a bug.  The patient fell on level floor not on the steps.  The patient has some difficulty getting up but did so with the assistance of his wife.  Pain Inventory Average Pain 0 Pain Right Now 0 My pain is no pain  LOCATION OF PAIN  na  BOWEL Number of stools per week: 10 Oral laxative use No  Type of laxative na Enema or suppository use No  History of colostomy No   Incontinent No   BLADDER Pads In and out cath, frequency na Able to self cath na Bladder incontinence No  Frequent urination Yes  Leakage with coughing No  Difficulty starting stream No  Incomplete bladder emptying No    Mobility walk with assistance use a walker ability to climb steps?  yes do you drive?  no  Function retired  Neuro/Psych bladder control problems  Prior Studies Any changes since last visit?  no  Physicians involved in your care Any changes since last visit?  yes Dermatologist   Family History  Problem Relation Age of Onset  . CVA Father   . Heart attack Maternal Grandmother   . Heart attack Maternal Grandfather   . Heart murmur Sister   . Hypertension Son    Social History   Socioeconomic History  . Marital status: Married    Spouse name: Not on file  . Number of children: 2  . Years of education: college  . Highest education level: Not on file  Occupational History  . Occupation: retitred  Tobacco Use  . Smoking status: Never Smoker  . Smokeless tobacco: Never Used  Vaping Use  . Vaping Use: Never used  Substance and Sexual Activity  . Alcohol use: No  . Drug use: No  . Sexual activity: Never  Other Topics Concern  . Not on file  Social History Narrative  . Not on file   Social Determinants of Health   Financial Resource Strain: Not on file  Food Insecurity: Not on file  Transportation Needs: Not on file  Physical Activity: Not on file  Stress: Not on file  Social Connections: Not on file   Past Surgical History:  Procedure Laterality Date  . CARDIAC CATHETERIZATION  03/02/2011   Recommended CABG  . COLON SURGERY    . CORONARY ARTERY BYPASS GRAFT  03/07/2011   DR.VAN  TRIGHT  . EYE SURGERY    . LEXISCAN MYOVIEW  02/20/2012   EKG negative for ischemia, noraml study  . TRANSTHORACIC ECHOCARDIOGRAM  02/20/2012   EF 50-55%, mild-moderate concentric LVH, LA moderately dilated, moderate mitral regurg  . VASCULAR DOPPLER   03/03/2011   Nosignificant extracranial carotid artery stenosis   Past Medical History:  Diagnosis Date  . Anxiety   . Atrial fibrillation ([place])   . Biceps tendon tear    right  . CAD (coronary artery disease)    [name]  . Gout   . Hypertension   . 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   [name]   BP 138/74   Pulse 92   Temp 98.3 F (36.8 C)   Ht 5\' 9"  (1.753 m)   Wt 211 lb 3.2 oz (95.8 kg)   SpO2 95%   BMI 31.19 kg/m   Opioid Risk Score:   Fall Risk Score:  `1  Depression screen PHQ 2/9  Depression screen PHQ 2/9 09/17/2020  Decreased Interest 0  Down, Depressed, Hopeless 1  PHQ - 2 Score 1  Altered sleeping 0  Tired, decreased energy 1  Change in appetite 0  Feeling bad or failure about yourself  0  Trouble concentrating 0  Moving slowly or fidgety/restless 0  Suicidal thoughts 0  PHQ-9 Score 2  Some recent data might be hidden   Review of Systems  All other systems reviewed and are negative.      Objective:   Physical Exam Vitals and nursing note reviewed.  Constitutional:      Appearance: He is obese.  HENT:     Head: Normocephalic and atraumatic.  Eyes:     Extraocular Movements: Extraocular movements intact.     Conjunctiva/sclera: Conjunctivae normal.     Pupils: Pupils are equal, round, and reactive to light.  Skin:    General: Skin is warm.     Comments: 2+ pitting edema right pretibial 2-3+ left pretibial stasis dermatitis changes left lower extremity with several blisters  Neurological:     Mental Status: He is alert and oriented to person, place, and time.     Comments: Motor strength is 5/5 bilateral deltoid bicep tricep grip 4/5 bilateral hip flexor knee extensor ankle dorsiflexor Sensation to light touch is reported is equal bilateral upper and lower limbs Visual fields are intact confrontation testing Extraocular muscles are intact  Psychiatric:        Behavior: Behavior normal.    Ambulates with a walker no evidence of toe drag or knee  instability.  The patient was tried without the walker with close standby assistance he is able to advance bilaterally with wide base of support slow cadence, short step length       Assessment & Plan:  1.  Perivascular white matter infarct no focal neurologic signs.  The patient still has some balance issues that physical therapy is working on.  He could benefit from training with floor transfers. Physical medicine rehab follow-up in 6 weeks May try driving with wife in car in a empty parking lot practice. Follow-up with Ortho for history of left fibula fracture Follow-up with  dermatology for stasis dermatitis Follow-up with cardiology for history of A. fib as well as diastolic dysfunction

## 2020-10-27 ENCOUNTER — Ambulatory Visit: Payer: Medicare HMO | Admitting: Physical Therapy

## 2020-10-27 ENCOUNTER — Other Ambulatory Visit: Payer: Self-pay | Admitting: Cardiovascular Disease

## 2020-10-28 ENCOUNTER — Ambulatory Visit: Payer: Medicare HMO

## 2020-10-28 ENCOUNTER — Other Ambulatory Visit: Payer: Self-pay

## 2020-10-28 DIAGNOSIS — R2689 Other abnormalities of gait and mobility: Secondary | ICD-10-CM

## 2020-10-28 DIAGNOSIS — R2681 Unsteadiness on feet: Secondary | ICD-10-CM

## 2020-10-28 DIAGNOSIS — Z9181 History of falling: Secondary | ICD-10-CM | POA: Diagnosis not present

## 2020-10-28 DIAGNOSIS — M6281 Muscle weakness (generalized): Secondary | ICD-10-CM | POA: Diagnosis not present

## 2020-10-28 NOTE — Therapy (Signed)
[facility] [address] [place], [place], [address] Phone: [phone]   Fax:  [phone]  Physical Therapy Treatment/Re-Certification  Patient Details  Name: Shane Espinoza MRN: [id number] Date of Birth: [birth year] Referring Provider (PT): Letta Pate, Luanna Salk, MD (followed by)   Encounter Date: 10/28/2020   PT End of Session - 10/28/20 1359    Visit Number 15    Number of Visits 27    Date for PT Re-Evaluation 12/27/20   updated POC for 6 weeks, Cert for 60 days   Authorization Type [employer]    Progress Note Due on Visit 19   completed on 9th visit   PT Start Time 1401    PT Stop Time 1447    PT Time Calculation (min) 46 min    Equipment Utilized During Treatment Gait belt    Activity Tolerance Patient tolerated treatment well    Behavior During Therapy WFL for tasks assessed/performed           Past Medical History:  Diagnosis Date  . Anxiety   . Atrial fibrillation ([place])   . Biceps tendon tear    right  . CAD (coronary artery disease)    [name]  . Gout   . Hypertension   . 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 CARDIAC CATHETERIZATION  03/02/2011   Recommended CABG  . COLON SURGERY    . CORONARY ARTERY BYPASS GRAFT  03/07/2011   DR.VAN  TRIGHT  . EYE SURGERY    . LEXISCAN MYOVIEW  02/20/2012   EKG negative for ischemia, noraml study  . TRANSTHORACIC ECHOCARDIOGRAM  02/20/2012   EF 50-55%, mild-moderate concentric LVH, LA moderately dilated, moderate mitral regurg  . VASCULAR DOPPLER  03/03/2011   Nosignificant extracranial carotid artery stenosis    There were no vitals filed for this visit.   Subjective Assessment - 10/28/20 1406    Subjective Patient had appt with Dr. Joan Mayans, went well. No falls. Patient reports on tuesday night he slide out of the chair, was able to get up but reports it was very challenging. Would  like to work on this.    Patient is accompained by: Family member   [name]   Pertinent History CAF, CAD s/p CABG 2012, CVA 2015, A fib- on Xarelto, Severe tricompartmental Right knee OA    Limitations Standing;Walking    How long can you walk comfortably? has been walking 20' at home with RW    Patient Stated Goals wants to be able to walk by himself.    Currently in Pain? No/denies             [facility] PT Assessment - 10/28/20 0001      Assessment   Medical Diagnosis Rt CVA   Rt ankle fx   Referring Provider (PT) Kirsteins, Luanna Salk, MD   followed by             [facility] Adult PT Treatment/Exercise - 10/28/20 0001      Transfers   Transfers Sit to Stand;Stand to Sit    Sit to Stand 5: Supervision    Stand to Sit 5: Supervision    Number of Reps 1 set;Other reps (comment)   5 reps   Comments continue to require increased moementum to complete sit > stand from low surface and BUE support. PT providing verbal cues for controlled descent  Ambulation/Gait   Ambulation/Gait Yes    Ambulation/Gait Assistance 5: Supervision    Ambulation/Gait Assistance Details Completed ambulation outdoors on unlevel surfaces x 600 ft with RW. Report 3/10 fatigue after ambulation. increased balance challenge negotiating RW over cracks in pavement.    Ambulation Distance (Feet) 600 Feet    Assistive device Rolling walker    Gait Pattern Decreased step length - left;Decreased stance time - right;Decreased weight shift to right;Step-through pattern;Step-to pattern    Ambulation Surface Unlevel;Outdoor;Paved    Curb Other (comment);5: Supervision   CGA   Curb Details (indicate cue type and reason) completed ascend/descend curb x 2 in therapy session with RW, supervision and one instance of CGA for proper completion. able to complete w/o verbal cues today.            Completed all of the following exercises during session as review and progression of HEP. Pt tolerating progression of exercises  from seated > standing at countertop well. Increased cues required for proper weight shift with reaching activity. PT educating on proper completion and how to perform safely at home.    Access Code: [email or web address] URL: [email or web address] Date: 10/28/2020 Prepared by: Baldomero Lamy  Exercises Sit to Stand - 1 x daily - 7 x weekly - 3 sets - 10 reps Seated Calf Stretch with Strap - 2-3 x daily - 5 x weekly - 3 sets - 15-20 hold Romberg Stance with Head Nods - 1 x daily - 5 x weekly - 2 sets - 10 reps Romberg Stance with Head Rotation - 1 x daily - 5 x weekly - 2 sets - 10 reps Heel Toe Raises with Counter Support - 1 x daily - 5 x weekly - 2 sets - 10 reps Standing March with Counter Support - 1 x daily - 5 x weekly - 2 sets - 10 reps Side Stepping with Counter Support - 1 x daily - 5 x weekly - 2 sets - 10 reps Side to Side Weight Shift with Overhead Reach and Counter Support - 1 x daily - 5 x weekly - 2 sets - 10 reps        PT Education - 10/28/20 1411    Education Details educated on progress toward LTGs; updated HEP    Person(s) Educated Patient;Spouse    Methods Explanation;Demonstration;Handout    Comprehension Verbalized understanding;Returned demonstration;Verbal cues required;Tactile cues required;Need further instruction            PT Short Term Goals - 10/04/20 1331      PT SHORT TERM GOAL #1   Title Pt will perform 5x sit <> stand in 32 seconds or less with UE support  in order to demo improved functional mobility. ALL STGS DUE 09/27/20    Baseline 40.13 seconds; 23.93 seconds    Time 4    Period Weeks    Status Achieved    Target Date 09/27/20      PT SHORT TERM GOAL #2   Title Pt will improve BERG to at least a 31/56 to decr fall risk.    Baseline 26/56; 32/56    Time 4    Period Weeks    Status Achieved      PT SHORT TERM GOAL #3   Title Pt will improve gait speed with RW to at least 1.7 ft/sec in order to demo decr fall risk.    Baseline  1.41 ft/sec with RW, 20.46 seconds = 1.6 ft/sec on 09/27/20    Time 4  Period Weeks    Status Partially Met      PT SHORT TERM GOAL #4   Title Pt will ambulate at least 60' with RW with supervision in order to demo improved household mobility.    Baseline met on 09/27/20    Time 4    Period Weeks    Status Achieved      PT SHORT TERM GOAL #5   Title Pt will perform TUG in 38 seconds or less with RW in order to demo decr fall risk.    Baseline 47.06 with RW, 25.25 seconds with RW on 09/27/20    Time 4    Period Weeks    Status Achieved             PT Long Term Goals - 10/28/20 1425      PT LONG TERM GOAL #1   Title Pt will be independent with final HEP in order to build upon functional gains made in therapy. ALL LTGS DUE 10/25/20    Baseline reports independence; will benefit from progress HEP    Time 8    Period Weeks    Status Achieved      PT LONG TERM GOAL #2   Title Pt will improve BERG to at least a 37/56 to decr fall risk.    Baseline 26/56; 38/56    Time 8    Period Weeks    Status Achieved      PT LONG TERM GOAL #3   Title Pt will ambulate at least 300' with LRAD with supervision over unlevel surfaces in order to demo improved community mobility.    Baseline 600' ft with RW outdoors, supervision    Time 8    Period Weeks    Status Achieved      PT LONG TERM GOAL #4   Title Pt will perform TUG in 20 seconds or less with RW vs. LRAD  in order to demo decr fall risk.    Baseline 47.06 with RW, 25.25 seconds on 09/27/20; 23.69 with RW 10/25/20    Time 8    Period Weeks    Status Not Met      PT LONG TERM GOAL #5   Title Pt will improve gait speed with RW to at least 2.0 ft/sec in order to demo decr fall risk.    Baseline 1.41 ft/sec with RW; 1.94 ft/sec with RW    Time 8    Period Weeks    Status Not Met      PT LONG TERM GOAL #6   Title Pt will perform 5x sit <> stand in 25 seconds or less with UE support in order to demo improved functional mobility.     Baseline 40.13 seconds; 24.18 secs with UE support    Time 8    Period Weeks    Status Achieved      PT LONG TERM GOAL #7   Title Pt will improve FOTO score to a 64 in order to demo improved function.    Baseline 55    Time 8    Period Weeks    Status On-going            Updated Short Term Goals:     PT Short Term Goals - 10/28/20 1503      PT SHORT TERM GOAL #1   Title Pt will perform 5x sit <> stand in 20 seconds or less with UE support  in order to demo improved functional mobility. ALL  STGS DUE: 11/18/20    Baseline 40.13 seconds; 23.93 seconds    Time 4    Period Weeks    Status Revised    Target Date 11/18/20      PT SHORT TERM GOAL #2   Title Pt will improve BERG to at least a 42/56 to decr fall risk.    Baseline 26/56; 32/56    Time 3    Period Weeks    Status Revised      PT SHORT TERM GOAL #3   Title Pt/spouse will be educating on Fall Prevention to promote improved safety within the home    Baseline Dependent    Time 3    Period Weeks    Status Revised      PT SHORT TERM GOAL #4   Title Pt will perform TUG in 20 seconds or less with RW in order to demo decr fall risk.    Baseline 23.69 secs    Time 3    Period Weeks    Status Revised      PT SHORT TERM GOAL #5   Title --    Baseline --    Time --    Period --    Status --          Updated Long Term Goals:   PT Long Term Goals - 10/28/20 1506      PT LONG TERM GOAL #1   Title Pt will be independent with final HEP in order to build upon functional gains made in therapy. ALL LTGS DUE: 12/09/20    Baseline will benefit from progress HEP    Time 6    Period Weeks    Status On-going    Target Date 12/09/20      PT LONG TERM GOAL #2   Title Pt will improve BERG to at least a 45/56 to demo improved balance and reduced fall risk    Baseline 26/56; 38/56    Time 6    Period Weeks    Status Revised      PT LONG TERM GOAL #3   Title Pt will ambulate at least 1000' with LRAD with supervision  over unlevel surfaces in order to demo improved community mobility.    Baseline 600' ft with RW outdoors, supervision    Time 6    Period Weeks    Status Revised      PT LONG TERM GOAL #4   Title Pt will perform TUG in 15 seconds or less with RW vs. LRAD in order to demo decr fall risk.    Baseline 47.06 with RW, 25.25 seconds on 09/27/20; 23.69 with RW 10/25/20    Time 8    Period Weeks    Status Revised      PT LONG TERM GOAL #5   Title Pt will improve gait speed with RW to at least 2.0 ft/sec in order to demo decr fall risk.    Baseline 1.41 ft/sec with RW; 1.94 ft/sec with RW    Time 8    Period Weeks    Status On-going      PT LONG TERM GOAL #6   Title Pt will perform 5x sit <> stand in 15 seconds or less with UE support in order to demo improved functional mobility.    Baseline 40.13 seconds; 24.18 secs with UE support    Time 6    Period Weeks    Status Revised      PT LONG TERM GOAL #  7   Title Pt will improve FOTO score to a 64 in order to demo improved function.    Baseline 55    Time 6    Period Weeks    Status On-going               Plan - 10/28/20 1459    Clinical Impression Statement Finished assessing patient's progress toward all LTG, with patient able to meet LTG #1 and 3 today. Patient demonstrating improved balance and tolerance for functional activities. Patient able to ambulate outdoors with RW at supervision level, but continue to demo high reliance on RW due to UE support. Patient fearful of knee buckling and reinjury to recent ankle fracture. Rest of session spent reviewing and progressing HEP to patient's tolerance. Patient is demonstrating slow steady progress however and will continue to benefit from skilled PT services.    Personal Factors and Comorbidities Comorbidity 3+;Past/Current Experience    Comorbidities CAF, CAD s/p CABG 2012, CVA 2015, A fib- on Xarelto, R ankle fx    Examination-Activity Limitations  Stairs;Stand;Transfers;Squat;Locomotion Level;Carry    Examination-Participation Restrictions Community Activity;Cleaning    Stability/Clinical Decision Making Evolving/Moderate complexity    Rehab Potential Good    PT Frequency 2x / week    PT Duration 8 weeks    PT Treatment/Interventions ADLs/Self Care Home Management;Gait training;DME Instruction;Functional mobility training;Therapeutic activities;Neuromuscular re-education;Balance training;Stair training;Therapeutic exercise;Patient/family education;Passive range of motion;Vestibular;Manual techniques    PT Next Visit Plan How was exercises?. Complete Sci[employer]. balance exercises in // bars (compliant surfaces, retro gait/side stepping and decr UE support). continue to work on LE strengthening (and ankle strength/ROM). Educate on Fall Prevention. Education/Practice on how to get up from fall.    PT Home Exercise Plan Access Code: [email or web address]    Consulted and Agree with Plan of Care Patient;Family member/caregiver    Family Member Consulted wife susan           Patient will benefit from skilled therapeutic intervention in order to improve the following deficits and impairments:  Abnormal gait,Decreased balance,Decreased activity tolerance,Decreased coordination,Decreased knowledge of use of DME,Decreased range of motion,Difficulty walking,Decreased strength,Hypomobility,Postural dysfunction  Visit Diagnosis: Unsteadiness on feet  Other abnormalities of gait and mobility  Muscle weakness (generalized)  History of falling     Problem List Patient Active Problem List   Diagnosis Date Noted  . Diabetes ([place]) 08/30/2020  . Ankle fracture, right 08/30/2020  . Knee pain 08/30/2020  . Stroke (cerebrum) ([place]) 08/11/2020  . Acute CVA (cerebrovascular accident) ([place]) 08/06/2020  . TIA (transient ischemic attack) 08/05/2020  . Gout 12/07/2013  . Atrial fibrillation ([place]) 12/07/2013  . Acute thrombotic stroke ([place]) 09/23/2013  . CVA  (cerebral infarction) 09/23/2013  . Hyperlipidemia with target LDL less than 70 04/16/2013  . CAD (coronary artery disease)   . Anxiety   . Hypertension   . S/P CABG x 4     Jones Bales, PT, DPT 10/28/2020, 3:03 PM  [facility] [address] [place], [place], [address] Phone: [phone]   Fax:  [phone]  Name: Shane Espinoza MRN: [id number] Date of Birth: 04-Oct-1940

## 2020-10-28 NOTE — Patient Instructions (Signed)
Access Code: [email or web address] URL: [email or web address] Date: 10/28/2020 Prepared by: Baldomero Lamy  Exercises Sit to Stand - 1 x daily - 7 x weekly - 3 sets - 10 reps Seated Calf Stretch with Strap - 2-3 x daily - 5 x weekly - 3 sets - 15-20 hold Romberg Stance with Head Nods - 1 x daily - 5 x weekly - 2 sets - 10 reps Romberg Stance with Head Rotation - 1 x daily - 5 x weekly - 2 sets - 10 reps Heel Toe Raises with Counter Support - 1 x daily - 5 x weekly - 2 sets - 10 reps Standing March with Counter Support - 1 x daily - 5 x weekly - 2 sets - 10 reps Side Stepping with Counter Support - 1 x daily - 5 x weekly - 2 sets - 10 reps Side to Side Weight Shift with Overhead Reach and Counter Support - 1 x daily - 5 x weekly - 2 sets - 10 reps

## 2020-10-29 ENCOUNTER — Ambulatory Visit: Payer: Medicare HMO | Admitting: Physical Medicine & Rehabilitation

## 2020-11-01 ENCOUNTER — Other Ambulatory Visit: Payer: Self-pay

## 2020-11-01 ENCOUNTER — Ambulatory Visit: Payer: Medicare HMO | Attending: Physician Assistant

## 2020-11-01 DIAGNOSIS — Z9181 History of falling: Secondary | ICD-10-CM | POA: Diagnosis not present

## 2020-11-01 DIAGNOSIS — M6281 Muscle weakness (generalized): Secondary | ICD-10-CM

## 2020-11-01 DIAGNOSIS — R2689 Other abnormalities of gait and mobility: Secondary | ICD-10-CM

## 2020-11-01 DIAGNOSIS — L0889 Other specified local infections of the skin and subcutaneous tissue: Secondary | ICD-10-CM | POA: Diagnosis not present

## 2020-11-01 DIAGNOSIS — R2681 Unsteadiness on feet: Secondary | ICD-10-CM

## 2020-11-01 DIAGNOSIS — L97921 Non-pressure chronic ulcer of unspecified part of left lower leg limited to breakdown of skin: Secondary | ICD-10-CM | POA: Diagnosis not present

## 2020-11-01 DIAGNOSIS — I8312 Varicose veins of left lower extremity with inflammation: Secondary | ICD-10-CM | POA: Diagnosis not present

## 2020-11-01 DIAGNOSIS — L308 Other specified dermatitis: Secondary | ICD-10-CM | POA: Diagnosis not present

## 2020-11-01 DIAGNOSIS — Z85828 Personal history of other malignant neoplasm of skin: Secondary | ICD-10-CM | POA: Diagnosis not present

## 2020-11-01 DIAGNOSIS — I8311 Varicose veins of right lower extremity with inflammation: Secondary | ICD-10-CM | POA: Diagnosis not present

## 2020-11-01 DIAGNOSIS — I872 Venous insufficiency (chronic) (peripheral): Secondary | ICD-10-CM | POA: Diagnosis not present

## 2020-11-01 NOTE — Therapy (Signed)
[facility] [address] [place], [place], [address] Phone: [phone]   Fax:  [phone]  Physical Therapy Treatment  Patient Details  Name: Shane Espinoza MRN: [id number] Date of Birth: 1940/10/09 Referring Provider (PT): Letta Pate Luanna Salk, MD (followed by)   Encounter Date: 11/01/2020   PT End of Session - 11/01/20 1326    Visit Number 16    Number of Visits 27    Date for PT Re-Evaluation 12/27/20   updated POC for 6 weeks, Cert for 60 days   Authorization Type [employer]    Progress Note Due on Visit 19   completed on 9th visit   PT Start Time 1317    PT Stop Time 1400    PT Time Calculation (min) 43 min    Equipment Utilized During Treatment Gait belt    Activity Tolerance Patient tolerated treatment well    Behavior During Therapy WFL for tasks assessed/performed           Past Medical History:  Diagnosis Date  . Anxiety   . Atrial fibrillation ([place])   . Biceps tendon tear    right  . CAD (coronary artery disease)    [name]  . Gout   . Hypertension   . 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 CARDIAC CATHETERIZATION  03/02/2011   Recommended CABG  . COLON SURGERY    . CORONARY ARTERY BYPASS GRAFT  03/07/2011   DR.VAN  TRIGHT  . EYE SURGERY    . LEXISCAN MYOVIEW  02/20/2012   EKG negative for ischemia, noraml study  . TRANSTHORACIC ECHOCARDIOGRAM  02/20/2012   EF 50-55%, mild-moderate concentric LVH, LA moderately dilated, moderate mitral regurg  . VASCULAR DOPPLER  03/03/2011   Nosignificant extracranial carotid artery stenosis    There were no vitals filed for this visit.   Subjective Assessment - 11/01/20 1321    Subjective Patient went back to dermatologist, reports the sore's are showing improvements. Is going to try the complression stocking. Exercises are going well.    Patient is accompained by: Family member    [name]   Pertinent History CAF, CAD s/p CABG 2012, CVA 2015, A fib- on Xarelto, Severe tricompartmental Right knee OA    Limitations Standing;Walking    How long can you walk comfortably? has been walking 20' at home with RW    Patient Stated Goals wants to be able to walk by himself.    Currently in Pain? No/denies              [facility] Adult PT Treatment/Exercise - 11/01/20 0001      Transfers   Transfers Sit to Stand;Stand to Sit    Sit to Stand 5: Supervision;4: Min guard    Stand to Sit 5: Supervision;4: Min guard    Comments completed sit <> stands from elevated mat without UE support (23") x 10 reps, PT providing cues for improved forward lean. then compelted with slightly lower surface at 21" x 10 reps, increased challenge noted but able to compelte with supervision/CGA. working on reduced UE support and improved sit > stand ability with forwad lean.      Ambulation/Gait   Ambulation/Gait Yes    Ambulation/Gait Assistance 5: Supervision;4: Min guard    Ambulation/Gait Assistance Details Completed ambulation at countertop without UE support, 6 x 10' with PT providing verbal cues for weight shift. Patient  continue to demo hesistancy with weight shift onto RLE. Trialed Rollator today, with patient ambulating x 230 ft working on improved endurance, patient able to safely manage AD. Cues for proper brake management required Pt hesistant at first, but demo some confidence with increased distance. Will continue to work on Rollator to determine if appropriate/safe option for patient for outdoor/community mobility.    Ambulation Distance (Feet) 10 Feet   x 6; 230 x 1   Assistive device Rolling walker;Rollator    Gait Pattern Decreased step length - left;Decreased stance time - right;Decreased weight shift to right;Step-through pattern;Step-to pattern    Ambulation Surface Level;Indoor      Neuro Re-ed    Neuro Re-ed Details  at countertop with single UE support completed alternating  steps over/back black beam x 10 reps bilaterally, increased challenge with step over LLE due to decreased weight shift to RLE.              PT Short Term Goals - 10/28/20 1503      PT SHORT TERM GOAL #1   Title Pt will perform 5x sit <> stand in 20 seconds or less with UE support  in order to demo improved functional mobility. ALL STGS DUE: 11/18/20    Baseline 40.13 seconds; 23.93 seconds    Time 4    Period Weeks    Status Revised    Target Date 11/18/20      PT SHORT TERM GOAL #2   Title Pt will improve BERG to at least a 42/56 to decr fall risk.    Baseline 26/56; 32/56    Time 3    Period Weeks    Status Revised      PT SHORT TERM GOAL #3   Title Pt/spouse will be educating on Fall Prevention to promote improved safety within the home    Baseline Dependent    Time 3    Period Weeks    Status Revised      PT SHORT TERM GOAL #4   Title Pt will perform TUG in 20 seconds or less with RW in order to demo decr fall risk.    Baseline 23.69 secs    Time 3    Period Weeks    Status Revised      PT SHORT TERM GOAL #5   Title --    Baseline --    Time --    Period --    Status --             PT Long Term Goals - 10/28/20 1506      PT LONG TERM GOAL #1   Title Pt will be independent with final HEP in order to build upon functional gains made in therapy. ALL LTGS DUE: 12/09/20    Baseline will benefit from progress HEP    Time 6    Period Weeks    Status On-going    Target Date 12/09/20      PT LONG TERM GOAL #2   Title Pt will improve BERG to at least a 45/56 to demo improved balance and reduced fall risk    Baseline 26/56; 38/56    Time 6    Period Weeks    Status Revised      PT LONG TERM GOAL #3   Title Pt will ambulate at least 1000' with LRAD with supervision over unlevel surfaces in order to demo improved community mobility.    Baseline 600' ft with RW outdoors, supervision  Time 6    Period Weeks    Status Revised      PT LONG TERM GOAL #4    Title Pt will perform TUG in 15 seconds or less with RW vs. LRAD in order to demo decr fall risk.    Baseline 47.06 with RW, 25.25 seconds on 09/27/20; 23.69 with RW 10/25/20    Time 8    Period Weeks    Status Revised      PT LONG TERM GOAL #5   Title Pt will improve gait speed with RW to at least 2.0 ft/sec in order to demo decr fall risk.    Baseline 1.41 ft/sec with RW; 1.94 ft/sec with RW    Time 8    Period Weeks    Status On-going      PT LONG TERM GOAL #6   Title Pt will perform 5x sit <> stand in 15 seconds or less with UE support in order to demo improved functional mobility.    Baseline 40.13 seconds; 24.18 secs with UE support    Time 6    Period Weeks    Status Revised      PT LONG TERM GOAL #7   Title Pt will improve FOTO score to a 64 in order to demo improved function.    Baseline 55    Time 6    Period Weeks    Status On-going                 Plan - 11/01/20 1452    Clinical Impression Statement Continued sit > stand training working on improved forward lean and reduced UE support to promote BLE strengthening. Initiated gait trianing with rollator today, with patient able to demo proper use of AD. WIll continue to utilize new AD during session to determine if appropriate device for community mobility. Will continue to progress toward all LTGs.    Personal Factors and Comorbidities Comorbidity 3+;Past/Current Experience    Comorbidities CAF, CAD s/p CABG 2012, CVA 2015, A fib- on Xarelto, R ankle fx    Examination-Activity Limitations Stairs;Stand;Transfers;Squat;Locomotion Level;Carry    Examination-Participation Restrictions Community Activity;Cleaning    Stability/Clinical Decision Making Evolving/Moderate complexity    Rehab Potential Good    PT Frequency 2x / week    PT Duration 8 weeks    PT Treatment/Interventions ADLs/Self Care Home Management;Gait training;DME Instruction;Functional mobility training;Therapeutic activities;Neuromuscular  re-education;Balance training;Stair training;Therapeutic exercise;Patient/family education;Passive range of motion;Vestibular;Manual techniques    PT Next Visit Plan 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 balance exercises in // bars (compliant surfaces, retro gait/side stepping and decr UE support). continue to work on LE strengthening (and ankle strength/ROM). Educate on Fall Prevention. Education/Practice on how to get up from fall.    PT Home Exercise Plan Access Code: [email or web address]    Consulted and Agree with Plan of Care Patient;Family member/caregiver    Family Member Consulted wife susan           Patient will benefit from skilled therapeutic intervention in order to improve the following deficits and impairments:  Abnormal gait,Decreased balance,Decreased activity tolerance,Decreased coordination,Decreased knowledge of use of DME,Decreased range of motion,Difficulty walking,Decreased strength,Hypomobility,Postural dysfunction  Visit Diagnosis: Unsteadiness on feet  Other abnormalities of gait and mobility  Muscle weakness (generalized)     Problem List Patient Active Problem List   Diagnosis Date Noted  . Diabetes ([place]) 08/30/2020  . Ankle fracture, right 08/30/2020  . Knee pain 08/30/2020  . Stroke (cerebrum) ([place]) 08/11/2020  .  Acute CVA (cerebrovascular accident) ([place]) 08/06/2020  . TIA (transient ischemic attack) 08/05/2020  . Gout 12/07/2013  . Atrial fibrillation ([place]) 12/07/2013  . Acute thrombotic stroke ([place]) 09/23/2013  . CVA (cerebral infarction) 09/23/2013  . Hyperlipidemia with target LDL less than 70 04/16/2013  . CAD (coronary artery disease)   . Anxiety   . Hypertension   . S/P CABG x 4     Jones Bales, PT, DPT 11/01/2020, 2:55 PM  [facility] [address] [place], [place], [address] Phone: [phone]   Fax:  [phone]  Name: Shane Espinoza MRN:  [id number] Date of Birth: 1940/08/29

## 2020-11-02 DIAGNOSIS — M1711 Unilateral primary osteoarthritis, right knee: Secondary | ICD-10-CM | POA: Diagnosis not present

## 2020-11-02 DIAGNOSIS — M25571 Pain in right ankle and joints of right foot: Secondary | ICD-10-CM | POA: Diagnosis not present

## 2020-11-04 ENCOUNTER — Encounter: Payer: Self-pay | Admitting: Neurology

## 2020-11-04 ENCOUNTER — Ambulatory Visit: Payer: Medicare HMO | Admitting: Neurology

## 2020-11-04 ENCOUNTER — Other Ambulatory Visit: Payer: Self-pay

## 2020-11-04 VITALS — BP 144/74 | Ht 68.5 in | Wt 213.8 lb

## 2020-11-04 DIAGNOSIS — R531 Weakness: Secondary | ICD-10-CM | POA: Diagnosis not present

## 2020-11-04 DIAGNOSIS — I6381 Other cerebral infarction due to occlusion or stenosis of small artery: Secondary | ICD-10-CM | POA: Diagnosis not present

## 2020-11-04 NOTE — Progress Notes (Signed)
[facility] [address] [place]. [place] [address] [phone]       OFFICE FOLLOW-UP NOTE  [name] [facility] Date of Birth:  [birth year] Medical Record Number:  [id number]   HPI: Shane Espinoza is a 80 year old pleasant Caucasian male seen today for initial office follow-up visit following hospital admission for stroke in February 2022.  He is accompanied by his wife.  History is obtained from them and review of electronic medical records and I have personally reviewed pertinent imaging films in PACS.  He has past medical history of hypertension, coronary artery disease s/p CABG and remote stroke in 2015 and chronic atrial fibrillation on long-term anticoagulation with Xarelto.  He presented on 08/05/2020 with sudden onset of left-sided weakness.  He was walking with his walker and noticed his left arm was weak and shaking and leg was also dragging.  He sat down and felt lightheaded.  Symptoms did not resolve so he called EMS.  His symptoms resolved by the time he reached the hospital.  NIH stroke scale was 0.  He was not given tPA.  Noncontrast CAT scan of the head showed mild atrophy and changes of small vessel disease.  MRI scan of the brain showed tiny restricted diffusion area in the periventricular region of the right lateral ventricle.  MRA showed probable carotid bifurcation stenosis but was limited by calcification.  Carotid ultrasound showed velocities consistent with 1-39% bilateral carotid stenosis with calcific plaque.  2D echo showed ejection fraction 55 to 60% with dilatation of left atrium.  LDL was 45 mg percent and hemoglobin A1c was 6.5.  Patient had been on Xarelto but was not taking it consistently at the same time and was counseled to take it at the same time but decent meal.  Patient was transferred to inpatient rehab did well and was subsequently discharged home and is currently finishing outpatient therapy.  He feels his almost back to normal and neck  slightly diminished fine motor skills ambulates with balance is not the same.  His main complaint is swelling in his legs.  His cardiologist Dr. Claiborne Billings is increase his Lasix but he has another upcoming appointment in a few weeks.  Is tolerating Xarelto well without bleeding but does have easy bruising.  He has no new complaints.  ROS:   14 system review of systems is positive for weakness, imbalance, gait difficulty, bruising and all other systems negative  PMH:  Past Medical History:  Diagnosis Date  . Anxiety   . Atrial fibrillation ([place])   . Biceps tendon tear    right  . CAD (coronary artery disease)    [name]  . Gout   . Hypertension   . 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 Marital status: Married    Spouse name: [name]  . Number of children: 2  . Years of education: college  . Highest education level: Not on file  Occupational History  . Occupation: retitred  Tobacco Use  . Smoking status: Never Smoker  . Smokeless tobacco: Never Used  Vaping Use  . Vaping Use: Never used  Substance and Sexual Activity  . Alcohol use: No  . Drug use: No  . Sexual activity: Never  Other Topics Concern  . Not on file  Social History Narrative   Lives at home with wife   Right Handed   Drinks caffeine occassionally   Social Determinants  of Health   Financial Resource Strain: Not on file  Food Insecurity: Not on file  Transportation Needs: Not on file  Physical Activity: Not on file  Stress: Not on file  Social Connections: Not on file  Intimate Partner Violence: Not on file    Medications:   Current Outpatient Medications on File Prior to Visit  Medication Sig Dispense Refill  . acetaminophen (TYLENOL) 500 MG tablet Take 500 mg by mouth every 6 (six) hours as needed for moderate pain.    Marland Kitchen allopurinol (ZYLOPRIM) 100 MG tablet Take 1 tablet by mouth daily.  2  . atorvastatin (LIPITOR) 40 MG tablet Take 1  tablet (40 mg total) by mouth at bedtime. 40 tablet 0  . blood glucose meter kit and supplies KIT Dispense based on patient and insurance preference. Use up to four times daily as directed. (FOR ICD-9 250.00, 250.01). 1 each 0  . Cholecalciferol (VITAMIN D-3) 5000 UNIT/ML LIQD 1 capsule    . Cholecalciferol (VITAMIN D3) 5000 UNITS CAPS Take by mouth. Monday, Wednesday, Friday    . diclofenac Sodium (VOLTAREN) 1 % GEL Apply 2 g topically 4 (four) times daily. To right knee 350 g 0  . docusate sodium (COLACE) 100 MG capsule Take 1 capsule (100 mg total) by mouth daily. 30 capsule 0  . furosemide (LASIX) 20 MG tablet Take 1 tablet daily by mouth. May take an extra tablet for swelling (Patient taking differently: 40 mg. Take 1 tablet daily by mouth. May take an extra tablet for swelling) 180 tablet 1  . glucose blood test strip For use when checking blood glucose DX: E11.9    . metFORMIN (GLUCOPHAGE) 500 MG tablet Take 0.5 tablets (250 mg total) by mouth daily with breakfast. 30 tablet 0  . metoprolol succinate (TOPROL-XL) 25 MG 24 hr tablet TAKE 1 TABLET DAILY 30 tablet 0  . ONETOUCH VERIO test strip 1 each daily.    . Probiotic Product (ALIGN) 4 MG CAPS Take 1 capsule by mouth every other day.    . rivaroxaban (XARELTO) 20 MG TABS tablet 1 tablet    . vitamin B-12 (CYANOCOBALAMIN) 1000 MCG tablet Take 1,000 mcg by mouth every evening.    . vitamin B-12 (CYANOCOBALAMIN) 1000 MCG tablet 1 tablet    . XARELTO 20 MG TABS tablet TAKE 1 TABLET DAILY WITH   SUPPER 90 tablet 1  . amLODipine (NORVASC) 5 MG tablet amlodipine 5 mg tablet    . beta carotene w/minerals (OCUVITE) tablet Take 1 tablet by mouth daily.    . calcium citrate (CALCITRATE - DOSED IN MG ELEMENTAL CALCIUM) 950 (200 Ca) MG tablet Take 1 tablet (200 mg of elemental calcium total) by mouth 2 (two) times daily.    Marland Kitchen docusate sodium (COLACE) 100 MG capsule 1 capsule as needed    . fish oil-omega-3 fatty acids 1000 MG capsule Take 1 g by mouth  3 (three) times a week.    . vancomycin (VANCOCIN) 125 MG capsule vancomycin 125 mg capsule     No current facility-administered medications on file prior to visit.    Allergies:   Allergies  Allergen Reactions  . Lisinopril     Other reaction(s): cough    Physical Exam General: well developed, well nourished pleasant elderly Caucasian male, seated, in no evident distress Head: head normocephalic and atraumatic.  Neck: supple with no carotid or supraclavicular bruits Cardiovascular: regular rate and rhythm, no murmurs Musculoskeletal: no deformity Skin:  no rash/petichiae multiple petechiae bilaterally Vascular:  Normal pulses all extremities There were no vitals filed for this visit. Neurologic Exam Mental Status: Awake and fully alert. Oriented to place and time. Recent and remote memory intact. Attention span, concentration and fund of knowledge appropriate. Mood and affect appropriate.  Cranial Nerves: Fundoscopic exam reveals sharp disc margins. Pupils equal, briskly reactive to light. Extraocular movements full without nystagmus. Visual fields full to confrontation. Hearing intact. Facial sensation intact.  Trace left lower facial asymmetry, tongue, palate moves normally and symmetrically.  Motor: Normal bulk and tone. Normal strength in all tested extremity muscles.  Diminished fine finger movements on the left.  Orbits right over left upper extremity. Sensory.: intact to touch ,pinprick .position and vibratory sensation.  Coordination: Rapid alternating movements normal in all extremities. Finger-to-nose and heel-to-shin performed accurately bilaterally. Gait and Station: Arises from chair with slight difficulty. Stance is stooped uses a walker.. Gait demonstrates normal stride length and balance .  Not able to heel, toe and tandem walk   Reflexes: 1+ and symmetric. Toes downgoing.   NIHSS  1 Modified Rankin  2   ASSESSMENT: 80 year old Caucasian male with right  periventricular white matter tiny lacunar infarct in February 2022 due to small vessel disease though he has chronic atrial fibrillation and is on long-term anticoagulation with Xarelto.  Remote history of stroke in 2015.  Vascular risk factors of atrial fibrillation, hypertension, hyperlipidemia, diabetes ,old stroke in 2015,and congestive heart failure     PLAN: I had a long d/w patient and his wife about his recent lacunar stroke, risk for recurrent stroke/TIAs, personally independently reviewed imaging studies and stroke evaluation results and answered questions.Continue xarelto for secondary stroke prevention for atrial fibrillation and maintain strict control of hypertension with blood pressure goal below 130/90, diabetes with hemoglobin A1c goal below 6.5% and lipids with LDL cholesterol goal below 70 mg/dL. I also advised the patient to eat a healthy diet with plenty of whole grains, cereals, fruits and vegetables, exercise regularly and maintain ideal body weight kep scheduled appointment with cardiology for leg swelling.Followup in the future with my nurse practitioner Janett Billow in 6 months or call earlier if necessary. Greater than 50% of time during this 25 minute visit was spent on counseling,explanation of diagnosis, planning of further management, discussion with patient and family and coordination of care Antony Contras, MD Note: This document was prepared with digital dictation and possible smart phrase technology. Any transcriptional errors that result from this process are unintentional

## 2020-11-04 NOTE — Patient Instructions (Signed)
I had a long d/w patient and his wife about his recent lacunar stroke, risk for recurrent stroke/TIAs, personally independently reviewed imaging studies and stroke evaluation results and answered questions.Continue xarelto for secondary stroke prevention for atrial fibrillation and maintain strict control of hypertension with blood pressure goal below 130/90, diabetes with hemoglobin A1c goal below 6.5% and lipids with LDL cholesterol goal below 70 mg/dL. I also advised the patient to eat a healthy diet with plenty of whole grains, cereals, fruits and vegetables, exercise regularly and maintain ideal body weight kep scheduled appointment with cardiology for leg swelling.Followup in the future with my nurse practitioner Janett Billow in 6 months or call earlier if necessary.  Stroke Prevention Some medical conditions and behaviors are associated with a higher chance of having a stroke. You can help prevent a stroke by making nutrition, lifestyle, and other changes, including managing any medical conditions you may have. What nutrition changes can be made?  Eat healthy foods. You can do this by: ? Choosing foods high in fiber, such as fresh fruits and vegetables and whole grains. ? Eating at least 5 or more servings of fruits and vegetables a day. Try to fill half of your plate at each meal with fruits and vegetables. ? Choosing lean protein foods, such as lean cuts of meat, poultry without skin, fish, tofu, beans, and nuts. ? Eating low-fat dairy products. ? Avoiding foods that are high in salt (sodium). This can help lower blood pressure. ? Avoiding foods that have saturated fat, trans fat, and cholesterol. This can help prevent high cholesterol. ? Avoiding processed and premade foods.  Follow your health care provider's specific guidelines for losing weight, controlling high blood pressure (hypertension), lowering high cholesterol, and managing diabetes. These may include: ? Reducing your daily calorie  intake. ? Limiting your daily sodium intake to 1,500 milligrams (mg). ? Using only healthy fats for cooking, such as olive oil, canola oil, or sunflower oil. ? Counting your daily carbohydrate intake.   What lifestyle changes can be made?  Maintain a healthy weight. Talk to your health care provider about your ideal weight.  Get at least 30 minutes of moderate physical activity at least 5 days a week. Moderate activity includes brisk walking, biking, and swimming.  Do not use any products that contain nicotine or tobacco, such as cigarettes and e-cigarettes. If you need help quitting, ask your health care provider. It may also be helpful to avoid exposure to secondhand smoke.  Limit alcohol intake to no more than 1 drink a day for nonpregnant women and 2 drinks a day for men. One drink equals 12 oz of beer, 5 oz of wine, or 1 oz of hard liquor.  Stop any illegal drug use.  Avoid taking birth control pills. Talk to your health care provider about the risks of taking birth control pills if: ? You are over 84 years old. ? You smoke. ? You get migraines. ? You have ever had a blood clot. What other changes can be made?  Manage your cholesterol levels. ? Eating a healthy diet is important for preventing high cholesterol. If cholesterol cannot be managed through diet alone, you may also need to take medicines. ? Take any prescribed medicines to control your cholesterol as told by your health care provider.  Manage your diabetes. ? Eating a healthy diet and exercising regularly are important parts of managing your blood sugar. If your blood sugar cannot be managed through diet and exercise, you may need to take  medicines. ? Take any prescribed medicines to control your diabetes as told by your health care provider.  Control your hypertension. ? To reduce your risk of stroke, try to keep your blood pressure below 130/80. ? Eating a healthy diet and exercising regularly are an important part  of controlling your blood pressure. If your blood pressure cannot be managed through diet and exercise, you may need to take medicines. ? Take any prescribed medicines to control hypertension as told by your health care provider. ? Ask your health care provider if you should monitor your blood pressure at home. ? Have your blood pressure checked every year, even if your blood pressure is normal. Blood pressure increases with age and some medical conditions.  Get evaluated for sleep disorders (sleep apnea). Talk to your health care provider about getting a sleep evaluation if you snore a lot or have excessive sleepiness.  Take over-the-counter and prescription medicines only as told by your health care provider. Aspirin or blood thinners (antiplatelets or anticoagulants) may be recommended to reduce your risk of forming blood clots that can lead to stroke.  Make sure that any other medical conditions you have, such as atrial fibrillation or atherosclerosis, are managed. What are the warning signs of a stroke? The warning signs of a stroke can be easily remembered as BEFAST.  B is for balance. Signs include: ? Dizziness. ? Loss of balance or coordination. ? Sudden trouble walking.  E is for eyes. Signs include: ? A sudden change in vision. ? Trouble seeing.  F is for face. Signs include: ? Sudden weakness or numbness of the face. ? The face or eyelid drooping to one side.  A is for arms. Signs include: ? Sudden weakness or numbness of the arm, usually on one side of the body.  S is for speech. Signs include: ? Trouble speaking (aphasia). ? Trouble understanding.  T is for time. ? These symptoms may represent a serious problem that is an emergency. Do not wait to see if the symptoms will go away. Get medical help right away. Call your local emergency services (911 in the [place] Do not drive yourself to the hospital.  Other signs of stroke may include: ? A sudden, severe headache  with no known cause. ? Nausea or vomiting. ? Seizure. Where to find more information For more information, visit:  American Stroke Association: www.strokeassociation.org  National Stroke Association: www.stroke.org Summary  You can prevent a stroke by eating healthy, exercising, not smoking, limiting alcohol intake, and managing any medical conditions you may have.  Do not use any products that contain nicotine or tobacco, such as cigarettes and e-cigarettes. If you need help quitting, ask your health care provider. It may also be helpful to avoid exposure to secondhand smoke.  Remember BEFAST for warning signs of stroke. Get help right away if you or a loved one has any of these signs. This information is not intended to replace advice given to you by your health care provider. Make sure you discuss any questions you have with your health care provider. Document Revised: 06/01/2017 Document Reviewed: 07/25/2016 Elsevier Patient Education  2021 [employer].

## 2020-11-05 ENCOUNTER — Other Ambulatory Visit: Payer: Self-pay

## 2020-11-05 ENCOUNTER — Ambulatory Visit: Payer: Medicare HMO

## 2020-11-05 DIAGNOSIS — Z9181 History of falling: Secondary | ICD-10-CM

## 2020-11-05 DIAGNOSIS — M6281 Muscle weakness (generalized): Secondary | ICD-10-CM

## 2020-11-05 DIAGNOSIS — R2681 Unsteadiness on feet: Secondary | ICD-10-CM

## 2020-11-05 DIAGNOSIS — R2689 Other abnormalities of gait and mobility: Secondary | ICD-10-CM | POA: Diagnosis not present

## 2020-11-05 NOTE — Therapy (Signed)
[facility] [address] [place], [place], [address] Phone: [phone]   Fax:  [phone]  Physical Therapy Treatment  Patient Details  Name: Shane Espinoza MRN: [id number] Date of Birth: Oct 13, 1940 Referring Provider (PT): Letta Pate, Luanna Salk, MD (followed by)   Encounter Date: 11/05/2020   PT End of Session - 11/05/20 1432    Visit Number 17    Number of Visits 27    Date for PT Re-Evaluation 12/27/20   updated POC for 6 weeks, Cert for 60 days   Authorization Type [employer]    Progress Note Due on Visit 19   completed on 9th visit   PT Start Time 1426    PT Stop Time 1511    PT Time Calculation (min) 45 min    Equipment Utilized During Treatment Gait belt    Activity Tolerance Patient tolerated treatment well    Behavior During Therapy WFL for tasks assessed/performed           Past Medical History:  Diagnosis Date  . Anxiety   . Atrial fibrillation ([place])   . Biceps tendon tear    right  . CAD (coronary artery disease)    [name]  . Gout   . Hypertension   . 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 CARDIAC CATHETERIZATION  03/02/2011   Recommended CABG  . COLON SURGERY    . CORONARY ARTERY BYPASS GRAFT  03/07/2011   DR.VAN  TRIGHT  . EYE SURGERY    . LEXISCAN MYOVIEW  02/20/2012   EKG negative for ischemia, noraml study  . TRANSTHORACIC ECHOCARDIOGRAM  02/20/2012   EF 50-55%, mild-moderate concentric LVH, LA moderately dilated, moderate mitral regurg  . VASCULAR DOPPLER  03/03/2011   Nosignificant extracranial carotid artery stenosis    There were no vitals filed for this visit.   Subjective Assessment - 11/05/20 1428    Subjective Patient report has had appt with dermatologist, does have infection in the leg and started on antibiotic. Was cleared from orthopedic MD. Dr. Leonie Man reports pleased with progress. Was told to wear  compression stocks all day, removed at night. No pain. No Falls.    Patient is accompained by: Family member   [name]   Pertinent History CAF, CAD s/p CABG 2012, CVA 2015, A fib- on Xarelto, Severe tricompartmental Right knee OA    Limitations Standing;Walking    How long can you walk comfortably? has been walking 20' at home with RW    Patient Stated Goals wants to be able to walk by himself.    Currently in Pain? No/denies             [facility] Adult PT Treatment/Exercise - 11/05/20 0001      Transfers   Transfers Sit to Stand;Stand to Sit    Sit to Stand 5: Supervision    Stand to Sit 5: Supervision    Comments completed sit <> stand with use of Rollator, PT educating on hand placement and brake management. PT providing cues for brake management, but demo carryover throughout session w/o verbal cues.      Ambulation/Gait   Ambulation/Gait Yes    Ambulation/Gait Assistance 5: Supervision;4: Min guard    Ambulation/Gait Assistance Details Completed ambulation x 600 ft around therapy gym with rollator, working on improved brake management and completed turns to R/L on command. Cues to stay within AD required once,  overall demo improved mobility and gait speed with AD. Progressed to gait outdoors, completed x 100 ft with supervision, increased ability to ambulate over bumps in concrete vs. standard RW.    Ambulation Distance (Feet) 400 Feet   x 1, 100 x 1   Assistive device Rollator    Gait Pattern Decreased step length - left;Decreased stance time - right;Decreased weight shift to right;Step-through pattern;Step-to pattern    Ambulation Surface Level;Indoor;Unlevel;Outdoor    Gait velocity 15.23 secs = 2.15 ft/sec    Curb 5: Supervision;Other (comment)    Curb Details (indicate cue type and reason) outdoors with rollator completed working on negotiating curb up/down, PT providing demonstration and cues for proper completion. Patient able to complete with CGA. proper brake management  utilized.      High Level Balance   High Level Balance Activities Negotitating around obstacles    High Level Balance Comments Completed ambulation with Rollator completed ambulaiton with negotiaiton around obstacles (3-4 cones) x 3 reps. Initially demo bumping into object but able to correct with verbal cues, demo improved carryover with completion.      Self-Care   Self-Care Other Self-Care Comments    Other Self-Care Comments  PT educating on purchase options for Rollator. Potential for visiting [employer] to trial other types due to interest. PT educating will continue to practice ambulating with device during session.                  PT Education - 11/05/20 1515    Education Details educated on Catering manager) Educated Patient;Spouse    Methods Explanation;Handout    Comprehension Verbalized understanding            PT Short Term Goals - 10/28/20 1503      PT SHORT TERM GOAL #1   Title Pt will perform 5x sit <> stand in 20 seconds or less with UE support  in order to demo improved functional mobility. ALL STGS DUE: 11/18/20    Baseline 40.13 seconds; 23.93 seconds    Time 4    Period Weeks    Status Revised    Target Date 11/18/20      PT SHORT TERM GOAL #2   Title Pt will improve BERG to at least a 42/56 to decr fall risk.    Baseline 26/56; 32/56    Time 3    Period Weeks    Status Revised      PT SHORT TERM GOAL #3   Title Pt/spouse will be educating on Fall Prevention to promote improved safety within the home    Baseline Dependent    Time 3    Period Weeks    Status Revised      PT SHORT TERM GOAL #4   Title Pt will perform TUG in 20 seconds or less with RW in order to demo decr fall risk.    Baseline 23.69 secs    Time 3    Period Weeks    Status Revised      PT SHORT TERM GOAL #5   Title --    Baseline --    Time --    Period --    Status --             PT Long Term Goals - 10/28/20 1506      PT  LONG TERM GOAL #1   Title Pt will be independent with final HEP in order to build upon functional gains  made in therapy. ALL LTGS DUE: 12/09/20    Baseline will benefit from progress HEP    Time 6    Period Weeks    Status On-going    Target Date 12/09/20      PT LONG TERM GOAL #2   Title Pt will improve BERG to at least a 45/56 to demo improved balance and reduced fall risk    Baseline 26/56; 38/56    Time 6    Period Weeks    Status Revised      PT LONG TERM GOAL #3   Title Pt will ambulate at least 1000' with LRAD with supervision over unlevel surfaces in order to demo improved community mobility.    Baseline 600' ft with RW outdoors, supervision    Time 6    Period Weeks    Status Revised      PT LONG TERM GOAL #4   Title Pt will perform TUG in 15 seconds or less with RW vs. LRAD in order to demo decr fall risk.    Baseline 47.06 with RW, 25.25 seconds on 09/27/20; 23.69 with RW 10/25/20    Time 8    Period Weeks    Status Revised      PT LONG TERM GOAL #5   Title Pt will improve gait speed with RW to at least 2.0 ft/sec in order to demo decr fall risk.    Baseline 1.41 ft/sec with RW; 1.94 ft/sec with RW    Time 8    Period Weeks    Status On-going      PT LONG TERM GOAL #6   Title Pt will perform 5x sit <> stand in 15 seconds or less with UE support in order to demo improved functional mobility.    Baseline 40.13 seconds; 24.18 secs with UE support    Time 6    Period Weeks    Status Revised      PT LONG TERM GOAL #7   Title Pt will improve FOTO score to a 64 in order to demo improved function.    Baseline 55    Time 6    Period Weeks    Status On-going                 Plan - 11/05/20 1522    Clinical Impression Statement Today's skilled PT session focused on continued gait training with Rollator on indoors and outdoor surface. Supervision throughout indoor/outdoors. Patient also demonstrating improved gait speed with Rollator. Continued education on  proper brake management, curb management, and benefits of Rollator. Patient and wife very interested in purchasing, PT educating on options. Will continue to use AD during session to promote carryover until patient obtains personal AD.    Personal Factors and Comorbidities Comorbidity 3+;Past/Current Experience    Comorbidities CAF, CAD s/p CABG 2012, CVA 2015, A fib- on Xarelto, R ankle fx    Examination-Activity Limitations Stairs;Stand;Transfers;Squat;Locomotion Level;Carry    Examination-Participation Restrictions Community Activity;Cleaning    Stability/Clinical Decision Making Evolving/Moderate complexity    Rehab Potential Good    PT Frequency 2x / week    PT Duration 8 weeks    PT Treatment/Interventions ADLs/Self Care Home Management;Gait training;DME Instruction;Functional mobility training;Therapeutic activities;Neuromuscular re-education;Balance training;Stair training;Therapeutic exercise;Patient/family education;Passive range of motion;Vestibular;Manual techniques    PT Next Visit Plan Continue Rollator Training. Complete [employer]. balance exercises in // bars (compliant surfaces, retro gait/side stepping and decr UE support). continue to work on LE strengthening (and ankle strength/ROM). Educate on Fall  Prevention. Education/Practice on how to get up from fall. Continue trial for [facility] with short distance    PT Home Exercise Plan Access Code: [email or web address]    Consulted and Agree with Plan of Care Patient;Family member/caregiver    Family Member Consulted wife susan           Patient will benefit from skilled therapeutic intervention in order to improve the following deficits and impairments:  Abnormal gait,Decreased balance,Decreased activity tolerance,Decreased coordination,Decreased knowledge of use of DME,Decreased range of motion,Difficulty walking,Decreased strength,Hypomobility,Postural dysfunction  Visit Diagnosis: Unsteadiness on feet  Other abnormalities of gait and  mobility  Muscle weakness (generalized)  History of falling     Problem List Patient Active Problem List   Diagnosis Date Noted  . Diabetes ([place]) 08/30/2020  . Ankle fracture, right 08/30/2020  . Knee pain 08/30/2020  . Stroke (cerebrum) ([place]) 08/11/2020  . Acute CVA (cerebrovascular accident) ([place]) 08/06/2020  . TIA (transient ischemic attack) 08/05/2020  . Gout 12/07/2013  . Atrial fibrillation ([place]) 12/07/2013  . Acute thrombotic stroke ([place] [employer]) 09/23/2013  . CVA (cerebral infarction) 09/23/2013  . Hyperlipidemia with target LDL less than 70 04/16/2013  . CAD (coronary artery disease)   . Anxiety   . Hypertension   . S/P CABG x 4     Jones Bales, PT, DPT 11/05/2020, 3:25 PM  [facility] [address] [place], [place], [address] Phone: [phone]   Fax:  [phone]  Name: Shane Espinoza MRN: [id number] Date of Birth: July 30, 1940

## 2020-11-08 ENCOUNTER — Ambulatory Visit: Payer: Medicare HMO

## 2020-11-08 ENCOUNTER — Other Ambulatory Visit: Payer: Self-pay

## 2020-11-08 DIAGNOSIS — R2689 Other abnormalities of gait and mobility: Secondary | ICD-10-CM | POA: Diagnosis not present

## 2020-11-08 DIAGNOSIS — M6281 Muscle weakness (generalized): Secondary | ICD-10-CM | POA: Diagnosis not present

## 2020-11-08 DIAGNOSIS — R2681 Unsteadiness on feet: Secondary | ICD-10-CM

## 2020-11-08 DIAGNOSIS — Z9181 History of falling: Secondary | ICD-10-CM | POA: Diagnosis not present

## 2020-11-08 NOTE — Therapy (Signed)
[facility] [address] [place], [place], [address] Phone: [phone]   Fax:  [phone]  Physical Therapy Treatment  Patient Details  Name: Shane Espinoza MRN: [id number] Date of Birth: [birth year] Referring Provider (PT): Letta Pate, Luanna Salk, MD (followed by)   Encounter Date: 11/08/2020   PT End of Session - 11/08/20 1319    Visit Number 18    Number of Visits 27    Date for PT Re-Evaluation 12/27/20   updated POC for 6 weeks, Cert for 60 days   Authorization Type [employer]    Progress Note Due on Visit 19   completed on 9th visit   PT Start Time 1316    PT Stop Time 1400    PT Time Calculation (min) 44 min    Equipment Utilized During Treatment Gait belt    Activity Tolerance Patient tolerated treatment well    Behavior During Therapy WFL for tasks assessed/performed           Past Medical History:  Diagnosis Date  . Anxiety   . Atrial fibrillation ([place])   . Biceps tendon tear    right  . CAD (coronary artery disease)    [name]  . Gout   . Hypertension   . 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 CARDIAC CATHETERIZATION  03/02/2011   Recommended CABG  . COLON SURGERY    . CORONARY ARTERY BYPASS GRAFT  03/07/2011   DR.VAN  TRIGHT  . EYE SURGERY    . LEXISCAN MYOVIEW  02/20/2012   EKG negative for ischemia, noraml study  . TRANSTHORACIC ECHOCARDIOGRAM  02/20/2012   EF 50-55%, mild-moderate concentric LVH, LA moderately dilated, moderate mitral regurg  . VASCULAR DOPPLER  03/03/2011   Nosignificant extracranial carotid artery stenosis    There were no vitals filed for this visit.   Subjective Assessment - 11/08/20 1320    Subjective Patient reports doing good, no new changes/complaints. No falls or pain to report    Patient is accompained by: Family member   spouse Shane Espinoza   Pertinent History CAF, CAD s/p CABG 2012, CVA 2015, A  fib- on Xarelto, Severe tricompartmental Right knee OA    Limitations Standing;Walking    How long can you walk comfortably? has been walking 20' at home with RW    Patient Stated Goals wants to be able to walk by himself.    Currently in Pain? No/denies              [facility] Adult PT Treatment/Exercise - 11/08/20 0001      Transfers   Transfers Sit to Stand;Stand to Sit    Sit to Stand 5: Supervision    Stand to Sit 5: Supervision    Comments completed sit <> stand from elevated mat working on improved forward lean and reduced UE support, compelted 2 x 10 reps slowly reducing height of mat for increased challenge.      Ambulation/Gait   Ambulation/Gait Yes    Ambulation/Gait Assistance 5: Supervision;4: Min guard    Ambulation/Gait Assistance Details continued use of rollator throughout session for carryover    Ambulation Distance (Feet) --   clinic distance   Assistive device Rollator    Gait Pattern Decreased step length - left;Decreased stance time - right;Decreased weight shift to right;Step-through pattern;Step-to pattern    Ambulation Surface Level;Indoor    Ramp 5: Supervision  Ramp Details (indicate cue type and reason) completed ascend/descend ramp with rollator x 2 reps,  PT providing cues for brake management. Patient able to complete safely with supervision.      High Level Balance   High Level Balance Activities Negotiating over obstacles;Side stepping;Backward walking;Marching forwards    High Level Balance Comments In // bars: compelted alternating toe taps to cone followed by stepovers x 4 laps down and back, progressing from BUE > single UE support. increase challenge with single UE support, as well as verbal cues required for improved step length. On blue mat in // bars: completed alternating marching forward followed by backwards walking, increased challenge with SLS on R side and step length on LLE due to decreased weight shift, completed x 3 laps down and back.  Completed side stepping down and back x 3 laps, verbal cues for step length with LLE to promote improved weight shift to RLE.      Neuro Re-ed    Neuro Re-ed Details  standing in // bars with single UE support, working on improved steps posterior to promote improved backwards and weight shift onto RLE, completed x 15 reps. Increased verbal cues reuqired for proper completion.               PT Short Term Goals - 10/28/20 1503      PT SHORT TERM GOAL #1   Title Pt will perform 5x sit <> stand in 20 seconds or less with UE support  in order to demo improved functional mobility. ALL STGS DUE: 11/18/20    Baseline 40.13 seconds; 23.93 seconds    Time 4    Period Weeks    Status Revised    Target Date 11/18/20      PT SHORT TERM GOAL #2   Title Pt will improve BERG to at least a 42/56 to decr fall risk.    Baseline 26/56; 32/56    Time 3    Period Weeks    Status Revised      PT SHORT TERM GOAL #3   Title Pt/spouse will be educating on Fall Prevention to promote improved safety within the home    Baseline Dependent    Time 3    Period Weeks    Status Revised      PT SHORT TERM GOAL #4   Title Pt will perform TUG in 20 seconds or less with RW in order to demo decr fall risk.    Baseline 23.69 secs    Time 3    Period Weeks    Status Revised      PT SHORT TERM GOAL #5   Title --    Baseline --    Time --    Period --    Status --             PT Long Term Goals - 10/28/20 1506      PT LONG TERM GOAL #1   Title Pt will be independent with final HEP in order to build upon functional gains made in therapy. ALL LTGS DUE: 12/09/20    Baseline will benefit from progress HEP    Time 6    Period Weeks    Status On-going    Target Date 12/09/20      PT LONG TERM GOAL #2   Title Pt will improve BERG to at least a 45/56 to demo improved balance and reduced fall risk    Baseline 26/56; 38/56    Time 6  Period Weeks    Status Revised      PT LONG TERM GOAL #3   Title  Pt will ambulate at least 1000' with LRAD with supervision over unlevel surfaces in order to demo improved community mobility.    Baseline 600' ft with RW outdoors, supervision    Time 6    Period Weeks    Status Revised      PT LONG TERM GOAL #4   Title Pt will perform TUG in 15 seconds or less with RW vs. LRAD in order to demo decr fall risk.    Baseline 47.06 with RW, 25.25 seconds on 09/27/20; 23.69 with RW 10/25/20    Time 8    Period Weeks    Status Revised      PT LONG TERM GOAL #5   Title Pt will improve gait speed with RW to at least 2.0 ft/sec in order to demo decr fall risk.    Baseline 1.41 ft/sec with RW; 1.94 ft/sec with RW    Time 8    Period Weeks    Status On-going      PT LONG TERM GOAL #6   Title Pt will perform 5x sit <> stand in 15 seconds or less with UE support in order to demo improved functional mobility.    Baseline 40.13 seconds; 24.18 secs with UE support    Time 6    Period Weeks    Status Revised      PT LONG TERM GOAL #7   Title Pt will improve FOTO score to a 64 in order to demo improved function.    Baseline 55    Time 6    Period Weeks    Status On-going                 Plan - 11/08/20 1508    Clinical Impression Statement Continued use of rollator working on ramp negotiation today, with patient able to complete supervision. Continued high level balance working on improved sit <> stand and high level balance working toward improved step length and weight shift. Continue to demo hesistancy with weight shift onto RLE. Will continue to progress toward all LTGs.    Personal Factors and Comorbidities Comorbidity 3+;Past/Current Experience    Comorbidities CAF, CAD s/p CABG 2012, CVA 2015, A fib- on Xarelto, R ankle fx    Examination-Activity Limitations Stairs;Stand;Transfers;Squat;Locomotion Level;Carry    Examination-Participation Restrictions Community Activity;Cleaning    Stability/Clinical Decision Making Evolving/Moderate complexity     Rehab Potential Good    PT Frequency 2x / week    PT Duration 8 weeks    PT Treatment/Interventions ADLs/Self Care Home Management;Gait training;DME Instruction;Functional mobility training;Therapeutic activities;Neuromuscular re-education;Balance training;Stair training;Therapeutic exercise;Patient/family education;Passive range of motion;Vestibular;Manual techniques    PT Next Visit Plan Progress Note. Complete SciFit. balance exercises in // bars (compliant surfaces, retro gait/side stepping and decr UE support). continue to work on LE strengthening (and ankle strength/ROM). Educate on Fall Prevention. Education/Practice on how to get up from fall. Continue trial for [facility] with short distance    PT Home Exercise Plan Access Code: [email or web address]    Consulted and Agree with Plan of Care Patient;Family member/caregiver    Family Member Consulted wife susan           Patient will benefit from skilled therapeutic intervention in order to improve the following deficits and impairments:  Abnormal gait,Decreased balance,Decreased activity tolerance,Decreased coordination,Decreased knowledge of use of DME,Decreased range of motion,Difficulty walking,Decreased strength,Hypomobility,Postural dysfunction  Visit Diagnosis:  Unsteadiness on feet  Other abnormalities of gait and mobility  Muscle weakness (generalized)     Problem List Patient Active Problem List   Diagnosis Date Noted  . Diabetes ([place]) 08/30/2020  . Ankle fracture, right 08/30/2020  . Knee pain 08/30/2020  . Stroke (cerebrum) ([place]) 08/11/2020  . Acute CVA (cerebrovascular accident) ([place]) 08/06/2020  . TIA (transient ischemic attack) 08/05/2020  . Gout 12/07/2013  . Atrial fibrillation ([place]) 12/07/2013  . Acute thrombotic stroke ([place]) 09/23/2013  . CVA (cerebral infarction) 09/23/2013  . Hyperlipidemia with target LDL less than 70 04/16/2013  . CAD (coronary artery disease)   . Anxiety   . Hypertension   . S/P CABG x 4      Jones Bales, PT, DPT 11/08/2020, 3:11 PM  [facility] [address] [place], [place], [address] Phone: [phone]   Fax:  [phone]  Name: CONWAY FEDORA MRN: [id number] Date of Birth: 16-May-1941

## 2020-11-12 ENCOUNTER — Other Ambulatory Visit: Payer: Self-pay

## 2020-11-12 ENCOUNTER — Ambulatory Visit: Payer: Medicare HMO

## 2020-11-12 DIAGNOSIS — R2681 Unsteadiness on feet: Secondary | ICD-10-CM

## 2020-11-12 DIAGNOSIS — Z9181 History of falling: Secondary | ICD-10-CM

## 2020-11-12 DIAGNOSIS — M6281 Muscle weakness (generalized): Secondary | ICD-10-CM

## 2020-11-12 DIAGNOSIS — R2689 Other abnormalities of gait and mobility: Secondary | ICD-10-CM | POA: Diagnosis not present

## 2020-11-12 NOTE — Therapy (Signed)
[facility] [address] [place], [place], [address] Phone: [phone]   Fax:  [phone]  Physical Therapy Treatment/Progress Note  Patient Details  Name: BLAIZE EPPLE MRN: [id number] Date of Birth: [birth year] Referring Provider (PT): Letta Pate Luanna Salk, MD (followed by)  Physical Therapy Progress Note   Dates of Reporting Period: 10/07/2020 - 11/12/2020  See Note below for Objective Data and Assessment of Progress/Goals.  Thank you for the referral of this patient. Guill[place], PT, DPT   Encounter Date: 11/12/2020   PT End of Session - 11/12/20 1319    Visit Number 19    Number of Visits 27    Date for PT Re-Evaluation 12/27/20   updated POC for 6 weeks, Cert for 60 days   Authorization Type [employer] Medicare    Progress Note Due on Visit 29   completed on 19th visit   PT Start Time 1316    PT Stop Time 1401    PT Time Calculation (min) 45 min    Equipment Utilized During Treatment Gait belt    Activity Tolerance Patient tolerated treatment well    Behavior During Therapy WFL for tasks assessed/performed           Past Medical History:  Diagnosis Date  . Anxiety   . Atrial fibrillation ([place])   . Biceps tendon tear    right  . CAD (coronary artery disease)    [name]  . Gout   . Hypertension   . 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 CARDIAC CATHETERIZATION  03/02/2011   Recommended CABG  . COLON SURGERY    . CORONARY ARTERY BYPASS GRAFT  03/07/2011   DR.VAN  TRIGHT  . EYE SURGERY    . LEXISCAN MYOVIEW  02/20/2012   EKG negative for ischemia, noraml study  . TRANSTHORACIC ECHOCARDIOGRAM  02/20/2012   EF 50-55%, mild-moderate concentric LVH, LA moderately dilated, moderate mitral regurg  . VASCULAR DOPPLER  03/03/2011   Nosignificant extracranial carotid artery stenosis    There were no vitals filed for this visit.    Subjective Assessment - 11/12/20 1320    Subjective Patient has stopped taking antiobiotic for wounds on leg. Reports that went up stairs and slept in his bed. Patient reports did try to take some steps without assistance at countertop.    Patient is accompained by: Family member   [name]   Pertinent History CAF, CAD s/p CABG 2012, CVA 2015, A fib- on Xarelto, Severe tricompartmental Right knee OA    Limitations Standing;Walking    How long can you walk comfortably? has been walking 20' at home with RW    Patient Stated Goals wants to be able to walk by himself.    Currently in Pain? No/denies               [facility] Adult PT Treatment/Exercise - 11/12/20 0001      Transfers   Transfers Sit to Stand;Stand to Sit    Sit to Stand 5: Supervision    Stand to Sit 5: Supervision      Ambulation/Gait   Ambulation/Gait Yes    Ambulation/Gait Assistance 5: Supervision;4: Min guard    Ambulation/Gait Assistance Details continued use of Rollator throughout session, x 115 ft. proper use of AD and brake management demonstrated. In // bars trialed use of SBQC, Patient continue to demo significant challenge with sequencing SBQC, also  having 2 instances of R knee buckling requiring assitance from // bars on R side and CGA from PT. Patient demo inability to move to less restrictive AD at this time due to intermittent concern for knee buckling.    Ambulation Distance (Feet) 115 Feet   x 1, x 30 ft in // bars   Assistive device Rollator;Small based quad cane    Gait Pattern Decreased step length - left;Decreased stance time - right;Decreased weight shift to right;Step-through pattern;Step-to pattern    Ambulation Surface Level;Indoor      Timed Up and Go Test   TUG Normal TUG    Normal TUG (seconds) 21.03   w/ Rollator     Self-Care   Self-Care Other Self-Care Comments    Other Self-Care Comments  Pt requesting to be weighed, PT ambulated with patient onto scale. patient weighed 214.8 today  during session.      Neuro Re-ed    Neuro Re-ed Details  standing with narrow BOS completed alternating diagonal lift/chops with 1# weighted ball x 10 reps, progressed second set x 10 reps standing with narrow BOS on blue mat. increased challenge on complaint surface. with staggered stance, completed standing w/o UE support and horizontal/vertical head turns x 15 reps, alternating foot position with second set. On rockerboard positioned A/P completed A/P weight shifts 2 x 10 reps, progressing BUE support > single UE > no UE support. patient unable to adequately weight shift w/o UE support today, increased use of hip strategy ntoed. Standing on firm surface with single UE > no UE suppotr completed alternating toe taps to 4' step working on [employer], cues for control.                    PT Short Term Goals - 10/28/20 1503      PT SHORT TERM GOAL #1   Title Pt will perform 5x sit <> stand in 20 seconds or less with UE support  in order to demo improved functional mobility. ALL STGS DUE: 11/18/20    Baseline 40.13 seconds; 23.93 seconds    Time 4    Period Weeks    Status Revised    Target Date 11/18/20      PT SHORT TERM GOAL #2   Title Pt will improve BERG to at least a 42/56 to decr fall risk.    Baseline 26/56; 32/56    Time 3    Period Weeks    Status Revised      PT SHORT TERM GOAL #3   Title Pt/spouse will be educating on Fall Prevention to promote improved safety within the home    Baseline Dependent    Time 3    Period Weeks    Status Revised      PT SHORT TERM GOAL #4   Title Pt will perform TUG in 20 seconds or less with RW in order to demo decr fall risk.    Baseline 23.69 secs    Time 3    Period Weeks    Status Revised      PT SHORT TERM GOAL #5   Title --    Baseline --    Time --    Period --    Status --             PT Long Term Goals - 10/28/20 1506      PT LONG TERM GOAL #1   Title Pt will be independent with final HEP in order to build  upon  functional gains made in therapy. ALL LTGS DUE: 12/09/20    Baseline will benefit from progress HEP    Time 6    Period Weeks    Status On-going    Target Date 12/09/20      PT LONG TERM GOAL #2   Title Pt will improve BERG to at least a 45/56 to demo improved balance and reduced fall risk    Baseline 26/56; 38/56    Time 6    Period Weeks    Status Revised      PT LONG TERM GOAL #3   Title Pt will ambulate at least 1000' with LRAD with supervision over unlevel surfaces in order to demo improved community mobility.    Baseline 600' ft with RW outdoors, supervision    Time 6    Period Weeks    Status Revised      PT LONG TERM GOAL #4   Title Pt will perform TUG in 15 seconds or less with RW vs. LRAD in order to demo decr fall risk.    Baseline 47.06 with RW, 25.25 seconds on 09/27/20; 23.69 with RW 10/25/20    Time 8    Period Weeks    Status Revised      PT LONG TERM GOAL #5   Title Pt will improve gait speed with RW to at least 2.0 ft/sec in order to demo decr fall risk.    Baseline 1.41 ft/sec with RW; 1.94 ft/sec with RW    Time 8    Period Weeks    Status On-going      PT LONG TERM GOAL #6   Title Pt will perform 5x sit <> stand in 15 seconds or less with UE support in order to demo improved functional mobility.    Baseline 40.13 seconds; 24.18 secs with UE support    Time 6    Period Weeks    Status Revised      PT LONG TERM GOAL #7   Title Pt will improve FOTO score to a 64 in order to demo improved function.    Baseline 55    Time 6    Period Weeks    Status On-going                 Plan - 11/12/20 1420    Clinical Impression Statement Patient completed TUG in 21.03 secs with rollator, continue to demo slow steady progress. continued gait trainign today with SBQC to detemrine if able to move to less restrictive AD for mobility within the home. Patient continue to demo intermittent buckling on RLE making it difficult to ambulate with [facility] today. Continued  balance activites with patient tolerating well. Will continue to progress toward all LTGs.    Personal Factors and Comorbidities Comorbidity 3+;Past/Current Experience    Comorbidities CAF, CAD s/p CABG 2012, CVA 2015, A fib- on Xarelto, R ankle fx    Examination-Activity Limitations Stairs;Stand;Transfers;Squat;Locomotion Level;Carry    Examination-Participation Restrictions Community Activity;Cleaning    Stability/Clinical Decision Making Evolving/Moderate complexity    Rehab Potential Good    PT Frequency 2x / week    PT Duration 8 weeks    PT Treatment/Interventions ADLs/Self Care Home Management;Gait training;DME Instruction;Functional mobility training;Therapeutic activities;Neuromuscular re-education;Balance training;Stair training;Therapeutic exercise;Patient/family education;Passive range of motion;Vestibular;Manual techniques    PT Next Visit Plan Educate on Fall Prevention. Education/Practice on how to get up from fall.Complete SciFit. balance exercises in // bars (compliant surfaces, retro gait/side stepping and decr UE support).  continue to work on LE strengthening (and ankle strength/ROM).    PT Home Exercise Plan Access Code: [email or web address]    Consulted and Agree with Plan of Care Patient;Family member/caregiver    Family Member Consulted wife susan           Patient will benefit from skilled therapeutic intervention in order to improve the following deficits and impairments:  Abnormal gait,Decreased balance,Decreased activity tolerance,Decreased coordination,Decreased knowledge of use of DME,Decreased range of motion,Difficulty walking,Decreased strength,Hypomobility,Postural dysfunction  Visit Diagnosis: Unsteadiness on feet  Other abnormalities of gait and mobility  Muscle weakness (generalized)  History of falling     Problem List Patient Active Problem List   Diagnosis Date Noted  . Diabetes ([place]) 08/30/2020  . Ankle fracture, right 08/30/2020  . Knee pain  08/30/2020  . Stroke (cerebrum) ([place]) 08/11/2020  . Acute CVA (cerebrovascular accident) ([place]) 08/06/2020  . TIA (transient ischemic attack) 08/05/2020  . Gout 12/07/2013  . Atrial fibrillation ([place]) 12/07/2013  . Acute thrombotic stroke ([place]) 09/23/2013  . CVA (cerebral infarction) 09/23/2013  . Hyperlipidemia with target LDL less than 70 04/16/2013  . CAD (coronary artery disease)   . Anxiety   . Hypertension   . S/P CABG x 4     Jones Bales, PT, DPT 11/12/2020, 2:22 PM  [facility] [address] [place], [place], [address] Phone: [phone]   Fax:  [phone]  Name: PRUITT TABOADA MRN: [id number] Date of Birth: Jul 30, 1940

## 2020-11-15 ENCOUNTER — Encounter: Payer: Self-pay | Admitting: Cardiovascular Disease

## 2020-11-15 ENCOUNTER — Ambulatory Visit: Payer: Medicare HMO | Admitting: Cardiovascular Disease

## 2020-11-15 ENCOUNTER — Other Ambulatory Visit: Payer: Self-pay

## 2020-11-15 VITALS — BP 132/78 | HR 83 | Ht 68.5 in | Wt 212.0 lb

## 2020-11-15 DIAGNOSIS — I4811 Longstanding persistent atrial fibrillation: Secondary | ICD-10-CM

## 2020-11-15 DIAGNOSIS — R600 Localized edema: Secondary | ICD-10-CM

## 2020-11-15 DIAGNOSIS — I6309 Cerebral infarction due to thrombosis of other precerebral artery: Secondary | ICD-10-CM | POA: Diagnosis not present

## 2020-11-15 DIAGNOSIS — I2510 Atherosclerotic heart disease of native coronary artery without angina pectoris: Secondary | ICD-10-CM

## 2020-11-15 DIAGNOSIS — Z951 Presence of aortocoronary bypass graft: Secondary | ICD-10-CM | POA: Diagnosis not present

## 2020-11-15 DIAGNOSIS — E785 Hyperlipidemia, unspecified: Secondary | ICD-10-CM | POA: Diagnosis not present

## 2020-11-15 DIAGNOSIS — I2583 Coronary atherosclerosis due to lipid rich plaque: Secondary | ICD-10-CM | POA: Diagnosis not present

## 2020-11-15 DIAGNOSIS — I10 Essential (primary) hypertension: Secondary | ICD-10-CM | POA: Diagnosis not present

## 2020-11-15 DIAGNOSIS — E118 Type 2 diabetes mellitus with unspecified complications: Secondary | ICD-10-CM

## 2020-11-15 MED ORDER — FUROSEMIDE 20 MG PO TABS: ORAL_TABLET | ORAL | 3 refills | Status: DC

## 2020-11-15 NOTE — Patient Instructions (Addendum)
Medication Instructions:  Take Lasix 40mg  (2 tablets) in the morning, if swelling persists take 20mg  (1 tablet) in the afternoon.   *If you need a refill on your cardiac medications before your next appointment, please call your pharmacy*   Lab Work: CMET, Lipid, TSH, CBC before next appontment   If you have labs (blood work) drawn today and your tests are completely normal, you will receive your results only by: Marland Kitchen MyChart Message (if you have MyChart) OR . A paper copy in the mail If you have any lab test that is abnormal or we need to change your treatment, we will call you to review the results.   Testing/Procedures: None ordered.   Other instructions: Monitor and log blood pressure.   Follow-Up: At [facility], you and your health needs are our priority.  As part of our continuing mission to provide you with exceptional heart care, we have created designated Provider Care Teams.  These Care Teams include your primary Cardiologist (physician) and Advanced Practice Providers (APPs -  Physician Assistants and Nurse Practitioners) who all work together to provide you with the care you need, when you need it.  We recommend signing up for the patient portal called "MyChart".  Sign up information is provided on this After Visit Summary.  MyChart is used to connect with patients for Virtual Visits (Telemedicine).  Patients are able to view lab/test results, encounter notes, upcoming appointments, etc.  Non-urgent messages can be sent to your provider as well.   To learn more about what you can do with MyChart, go to [email or web address].    Your next appointment:   3 month(s)  The format for your next appointment:   In Person  Provider:   Shelva Majestic, MD

## 2020-11-15 NOTE — Progress Notes (Signed)
Patient ID: Shane Espinoza, male   DOB: Sep 02, 1940, 80 y.o.   MRN: [id number]      HPI: Shane Espinoza is a 80 y.o. male who presents to the office for a 7 month cardiology evaluation.    Shane Espinoza has established CAD and underwent CABG revascularization surgery by Dr. Nils Pyle on 03/07/2011 after a nuclear perfusion study revealed significant scar/ischemia and cardiac catheterization demonstrated severe multivessel CAD. He had aLIMA to the LAD, vein to the OM, vein to the RV marginal, and vein to the PDA. He  developed PAF postoperatively and underwent TEE guided cardioversion with restoration of sinus rhythm. He completed cardiac rehabilitation. A nuclear perfusion study in August 2013 was markedly improved did not reveal any region of scar or ischemia. He has been off amiodarone and maintaining sinus rhythm. Additional problems include hypertension, hyperlipidemia, and intermittent lower extremity edema.  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  An MRI of his brain showed a tiny left operculum, hemorrhage, infarct, is concerned that potentially this was thrombotic.  He did not have any loss of function of the upper or lower extremities and denied any headache, dizziness, visual symptoms, or difficulty swallowing.  His ECG at that time did suggest sinus rhythm.  He now is on anticoagulation with Xarelto 20 mg. His head CT was negative as was his MRA of the large and medium size vessels.  His carotids were widely patent.  He denies chest pain.  He denies any significant neurologic sequelae.  He is  in permanent atrial fibrillation on chronic anticoagulation with Xarelto.  He has had episodes of recurrent gout.  I had taken him off HCTZ since this could be contributing to elevation of uric acid.  He is now on low-dose chronic alliupurinol and since instituting therapy has not had any recurrent gout episodes.   When I saw him in October 2018 he denied  any episodes of chest pain, palpitations, presyncope or syncope.  He underwent a nuclear stress test on 02/20/2017 which showed an EF of 47% with mild diffuse hypokinesis.  Is no evidence for scar or ischemia.  Because of the slightly reduced EF.  He underwent a echo Doppler study on 03/14/2017.  This showed an estimated EF at 55% without regional wall motion abnormalities.  His left atrium was severely dilated.  Contributed by his atrial fibrillation.  He has had issues with some intermittent leg swelling and was on amlodipine at 2.5 mg, which was reduced because of edema, furosemide 20 mg 5 days per week, irbesartan 300 mg and metoprolol, tartrate 25 mg in the morning and 12.5 mmol at night.  He is on simvastatin for hyperlipidemia and Xarelto for anticoagulation.    I saw him on June 04, 2018 at which time he remained relatively stable from a cardiac standpoint.  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  He does use a recumbent bike at the gym.  He underwent laboratory by Dr. Inda Merlin September 2019 which showed a glucose of 134.  Renal function was normal.  Lipid studies are excellent with a total cholesterol 111 HDL 37 LDL 53 and triglycerides 105.  Creatinine was 0.99.  Hemoglobin was 15.  He has been wearing support stockings 15 to 20 mm which has been helpful for his leg swelling.  He states his blood pressure typically runs in the 140s to 150s.  His pulse typically runs in the 50s  to 40s and occasionally in the 70s.    When I last saw him in December 2020 he was not able to go to the gym due to the COVID-19 pandemic.  His main exercise was walking his dog.  He admits to some weight gain.  He denied anginal symptoms.  He has had follow-up lab work with his primary physician Dr. Inda Merlin and hemoglobin A1c was 6.2.  He wears support stockings most of the time.  He denies bleeding on anticoagulation.  His atrial fibrillation rate  has been  well controlled.  At that evaluation, his blood pressure was stable on amlodipine at a reduced dose of 2.5 mg, metoprolol succinate 25 mg, valsartan 320 mg and spironolactone 12.5 mg.  I last saw him in October 2021.  Over the prior year he had done fairly well from a cardiovascular standpoint.    However, he had recently had diarrhea and was diagnosed with C. difficile and E. coli for which he took antibiotics.  He also is uncircumcised and his have been difficulty with scarring.  He has seen Dr. Diona Fanti of [facility] and was told of having phimosis.  He is in need to undergo a modified circumcision and has been on anticoagulation therapy.   Since I saw him, he had fallen and fractured his right ankle in January 2022.  He was hospitalized in February 2022 from February 9 through February 24 with left-sided weakness and numbness and recurrent fall.  He continued to have some weakness in his left hand.  An MRI of his brain showed a focus of restricted diffusion with acute/subacute infarct and periventricular white matter.  He was evaluated by Dr. Leonie Man who felt a stroke was due to small vessel disease.  An echo Doppler study on August 05, 2020 showed an EF of 55 to 60% with grade 1 diastolic dysfunction, mild biatrial enlargement, and aortic valve sclerosis.  He was maintained on Xarelto anticoagulation.  Metformin was added for blood sugar control and he also underwent rehabilitation.  Subsequently, he developed a left leg wound culture positive for Pseudomonas for which she was treated with Cipro and additional antibiotic.  Presently, he denies any chest pain.  He does have ankle swelling.  He is undergoing neuro rehabilitation.  He continues to be on atorvastatin 40 mg for hyperlipidemia.  He has been on furosemide 20 or 40 mg depending upon edema, metoprolol succinate 25 mg daily, metformin and continues to be on Xarelto with his atrial fibrillation anticoagulation.  He presents for  evaluation.  Past Medical History:  Diagnosis Date  . Anxiety   . Atrial fibrillation ([place])   . Biceps tendon tear    right  . CAD (coronary artery disease)    Shane Espinoza  . Gout   . Hypertension   . 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 CARDIAC CATHETERIZATION  03/02/2011   Recommended CABG  . COLON SURGERY    . CORONARY ARTERY BYPASS GRAFT  03/07/2011   DR.VAN  Espinoza  . EYE SURGERY    . LEXISCAN MYOVIEW  02/20/2012   EKG negative for ischemia, noraml study  . TRANSTHORACIC ECHOCARDIOGRAM  02/20/2012   EF 50-55%, mild-moderate concentric LVH, LA moderately dilated, moderate mitral regurg  . VASCULAR DOPPLER  03/03/2011   Nosignificant extracranial carotid artery stenosis    Allergies  Allergen Reactions  . Lisinopril     Other reaction(s): cough  Current Outpatient Medications  Medication Sig Dispense Refill  . acetaminophen (TYLENOL) 500 MG tablet Take 500 mg by mouth every 6 (six) hours as needed for moderate pain.    Marland Kitchen allopurinol (ZYLOPRIM) 100 MG tablet Take 1 tablet by mouth daily.  2  . atorvastatin (LIPITOR) 40 MG tablet Take 1 tablet (40 mg total) by mouth at bedtime. 40 tablet 0  . beta carotene w/minerals (OCUVITE) tablet Take 1 tablet by mouth daily.    . blood glucose meter kit and supplies KIT Dispense based on patient and insurance preference. Use up to four times daily as directed. (FOR ICD-9 250.00, 250.01). 1 each 0  . calcium citrate (CALCITRATE - DOSED IN MG ELEMENTAL CALCIUM) 950 (200 Ca) MG tablet Take 1 tablet (200 mg of elemental calcium total) by mouth 2 (two) times daily.    . Cholecalciferol (VITAMIN D-3) 5000 UNIT/ML LIQD 1 capsule    . Cholecalciferol (VITAMIN D3) 5000 UNITS CAPS Take by mouth. Monday, Wednesday, Friday    . diclofenac Sodium (VOLTAREN) 1 % GEL Apply 2 g topically 4 (four) times daily. To right knee 350 g 0  . docusate sodium (COLACE) 100 MG capsule Take 1 capsule (100  mg total) by mouth daily. 30 capsule 0  . docusate sodium (COLACE) 100 MG capsule 1 capsule as needed    . fish oil-omega-3 fatty acids 1000 MG capsule Take 1 g by mouth 3 (three) times a week.    . furosemide (LASIX) 20 MG tablet Take 1 tablet daily by mouth. May take an extra tablet for swelling (Patient taking differently: 40 mg. Take 1 tablet daily by mouth. May take an extra tablet for swelling) 180 tablet 1  . glucose blood test strip For use when checking blood glucose DX: E11.9    . metFORMIN (GLUCOPHAGE) 500 MG tablet Take 0.5 tablets (250 mg total) by mouth daily with breakfast. 30 tablet 0  . metoprolol succinate (TOPROL-XL) 25 MG 24 hr tablet TAKE 1 TABLET DAILY 30 tablet 0  . ONETOUCH VERIO test strip 1 each daily.    . Probiotic Product (ALIGN) 4 MG CAPS Take 1 capsule by mouth every other day.    . rivaroxaban (XARELTO) 20 MG TABS tablet 1 tablet    . vancomycin (VANCOCIN) 125 MG capsule vancomycin 125 mg capsule    . vitamin B-12 (CYANOCOBALAMIN) 1000 MCG tablet Take 1,000 mcg by mouth every evening.    . vitamin B-12 (CYANOCOBALAMIN) 1000 MCG tablet 1 tablet    . XARELTO 20 MG TABS tablet TAKE 1 TABLET DAILY WITH   SUPPER 90 tablet 1   No current facility-administered medications for this visit.    Social History   Socioeconomic History  . Marital status: Married    Spouse name: [name]  . Number of children: 2  . Years of education: college  . Highest education level: Not on file  Occupational History  . Occupation: retitred  Tobacco Use  . Smoking status: Never Smoker  . Smokeless tobacco: Never Used  Vaping Use  . Vaping Use: Never used  Substance and Sexual Activity  . Alcohol use: No  . Drug use: No  . Sexual activity: Never  Other Topics Concern  . Not on file  Social History Narrative   Lives at home with wife   Right Handed   Drinks caffeine occassionally   Social Determinants of Health   Financial Resource Strain: Not on file  Food Insecurity:  Not on file  Transportation Needs:  Not on file  Physical Activity: Not on file  Stress: Not on file  Social Connections: Not on file  Intimate Partner Violence: Not on file    Family History  Problem Relation Age of Onset  . CVA Father   . Heart attack Maternal Grandmother   . Heart attack Maternal Grandfather   . Heart murmur Sister   . Hypertension Son    Socially he is married has 2 children 4 grandchildren. He now exercises fairly regularly. There is no tobacco or alcohol use.   ROS General: Negative; No fevers, chills, or night sweats;  HEENT: Negative; No changes in vision or hearing, sinus congestion, difficulty swallowing Pulmonary: Negative; No cough, wheezing, shortness of breath, hemoptysis Cardiovascular: see history of present illness Positive for intermittent edema GI: Positive for hemorrhoids and he status post hemorrhoidal banding; recent C. difficile infection GU: Recently diagnosed phimosis in need for modified circumcision Musculoskeletal: Positive for gout; right ankle fracture with oblique fracture of the distal fibula Hematologic/Oncology: Negative; no easy bruising, bleeding Endocrine: Negative; no heat/cold intolerance; no diabetes Neuro: Positive for thrombotic stroke; recent probable stroke due to small vessel disease;no changes in balance, headaches Skin: Negative; No rashes or skin lesions Psychiatric: Negative; No behavioral problems, depression Sleep: Negative; No snoring, daytime sleepiness, hypersomnolence, bruxism, restless legs, hypnogognic hallucinations, no cataplexy Other comprehensive 14 point system review is negative.   PE BP 132/78   Pulse 83   Ht 5' 8.5" (1.74 m)   Wt 212 lb (96.2 kg)   SpO2 96%   BMI 31.77 kg/m    Repeat blood pressure by me was 148/80  Wt Readings from Last 3 Encounters:  11/15/20 212 lb (96.2 kg)  11/04/20 213 lb 12.8 oz (97 kg)  10/26/20 211 lb 3.2 oz (95.8 kg)   General: Alert, oriented, no distress.   Skin: normal turgor, no rashes, warm and dry HEENT: Normocephalic, atraumatic. Pupils equal round and reactive to light; sclera anicteric; extraocular muscles intact; Fundi ** Nose without nasal septal hypertrophy Mouth/Parynx benign; Mallinpatti scale Neck: No JVD, no carotid bruits; normal carotid upstroke Lungs: clear to ausculatation and percussion; no wheezing or rales Chest wall: without tenderness to palpitation Heart: PMI not displaced, RRR, s1 s2 normal, 1/6 systolic murmur, no diastolic murmur, no rubs, gallops, thrills, or heaves Abdomen: soft, nontender; no hepatosplenomehaly, BS+; abdominal aorta nontender and not dilated by palpation. Back: no CVA tenderness Pulses 2+ Musculoskeletal: full range of motion, normal strength, no joint deformities Extremities: Ankle swelling no clubbing cyanosis, Homan's sign negative  Neurologic: grossly nonfocal; Cranial nerves grossly wnl Psychologic: Normal mood and affect  ECG (independently read by me): Atrial fibrillation at 83  October 2021 ECG (independently read by me): Atrial fibrillation at 75, PVC  December 2020 ECG (independently read by me): Atrial fibrillation at 81 bpm.  Poor anterior R wave progression.  QTc interval 446 ms  December 2019 ECG (independently read by me): Atrial fibrillation at 76 bpm QTc interval 443 ms.  No ECG done today, but review of ECG at the time of his nuclear study on 02/20/2017 showed atrial fibrillation at 78 bpm  November 2017 ECG (independently read by me): Atrial fibrillation at 70 bpm.  Poor anterior R-wave progression.  QTc interval 475 ms.  November 2016 ECG (independently read by me): Atrial fibrillation at 67 bpm.  Nonspecific ST changes.  QTc interval normal at 443 ms.  May 2016 ECG (independently read by me): Atrial fibrillation with a controlled ventricular rate in the 60s.  December 2015 ECG (independently read by me): Atrial fibrillation with a ventricular rate at 84.  No  significant ST-T changes  June 2015 ECG (independently read by me): Atrial fibrillation with a ventricular rate is 74.  Nonspecific ST changes.  10/09/2013 ECG (independently read by me): Atrial fibrillation with controlled ventricular response at 63 beats per minute.  Prior April 16 2013 ECG: Sinus bradycardia at 52 beats per minute.  LABS: BMP Latest Ref Rng & Units 08/23/2020 08/16/2020 08/12/2020  Glucose 70 - 99 mg/dL 113(H) 111(H) 127(H)  BUN 8 - 23 mg/dL 17 21 17   Creatinine 0.61 - 1.24 mg/dL 1.21 1.15 1.24  BUN/Creat Ratio 10 - 24 - - -  Sodium 135 - 145 mmol/L 140 137 138  Potassium 3.5 - 5.1 mmol/L 4.2 3.8 4.4  Chloride 98 - 111 mmol/L 104 104 105  CO2 22 - 32 mmol/L 28 23 25   Calcium 8.9 - 10.3 mg/dL 9.5 8.9 9.1   Hepatic Function Latest Ref Rng & Units 08/12/2020 08/05/2020 12/08/2014  Total Protein 6.5 - 8.1 g/dL 6.1(L) 7.1 7.1  Albumin 3.5 - 5.0 g/dL 2.9(L) 3.3(L) 4.1  AST 15 - 41 U/L 25 29 24   ALT 0 - 44 U/L 20 22 19   Alk Phosphatase 38 - 126 U/L 64 74 65  Total Bilirubin 0.3 - 1.2 mg/dL 1.1 1.3(H) 1.1   CBC Latest Ref Rng & Units 08/23/2020 08/16/2020 08/12/2020  WBC 4.0 - 10.5 K/uL 7.1 8.3 9.0  Hemoglobin 13.0 - 17.0 g/dL 14.9 14.3 14.3  Hematocrit 39.0 - 52.0 % 46.0 43.6 44.7  Platelets 150 - 400 K/uL 202 179 180   Lab Results  Component Value Date   MCV 93.1 08/23/2020   MCV 91.6 08/16/2020   MCV 93.5 08/12/2020   Lab Results  Component Value Date   TSH 2.493 08/05/2020   Lab Results  Component Value Date   HGBA1C 6.5 (H) 08/06/2020   Lipid Panel     Component Value Date/Time   CHOL 101 08/06/2020 0604   TRIG 102 08/06/2020 0604   HDL 36 (L) 08/06/2020 0604   CHOLHDL 2.8 08/06/2020 0604   VLDL 20 08/06/2020 0604   LDLCALC 45 08/06/2020 0604    IMPRESSION:  No diagnosis found.  ASSESSMENT AND PLAN: Mr. Kazmi is an 80 year-old white male who underwent CABG revascularization surgery for severe multivessel CAD in September 2012.  He suffered  a small thrombotic CVA most likely due to atrial fibrillation etiology in March 2015 and  had complete resolution of symptoms.  He has permanent atrial fibrillation with a controlled ventricular response for which he is entirely asymptomatic.  He continues to be on Xarelto anticoagulation therapy. His last nuclear stress test in August 2018 continued to show normal perfusion without scar or ischemia.  There was some discordance to the EF on the nuclear study when compared to the echo.  The echo revealed an EF of 55% without wall motion abnormality, severe LA dilatation, which undoubtedly is contributed by his atrial fibrillation.  Since his last evaluation with me, on July 07, 2020 he had fallen and sustained a oblique fracture of the distal fibula.  In August 05, 2020 he was hospitalized with left-sided weakness and numbness after recurrent fall.  He had resolution of left lower extremity symptoms but he continued to have weakness in his left hand.  An MRI of the brain showed probable acute subacute infarct in the periventricular white matter and it was felt by Dr. Leonie Man that  his stroke was due to small vessel disease.  He has continued to be on Xarelto for his permanent atrial fibrillation and chronic anticoagulation.  His blood pressure today initially was 132/78 and on repeat by me was 148/80.  He does have some ankle edema and he recently had Pseudomonas involving a left leg wound treated with antibiotic therapy.  His resting pulse is in the 80s on metoprolol succinate 25 mg daily.  I have recommended he increase his furosemide to 40 mg every morning and depending upon if residual edema continues he can take an extra 20 mg in the afternoon.  He is diabetic on metformin.  He is on atorvastatin for hyperlipidemia with target LDL less than 70.  He is not having any bleeding on Xarelto.  I have recommended support stockings with his edema.  Repeat laboratory will be obtained in 2 to 3 months with a comprehensive  metabolic panel, CBC, lipid studies and TSH and I will see him for follow-up evaluation.   Troy Sine, MD, [facility]  11/15/2020 8:42 AM

## 2020-11-17 ENCOUNTER — Ambulatory Visit: Payer: Medicare HMO | Admitting: Physical Therapy

## 2020-11-17 ENCOUNTER — Encounter: Payer: Self-pay | Admitting: Physical Therapy

## 2020-11-17 ENCOUNTER — Other Ambulatory Visit: Payer: Self-pay

## 2020-11-17 VITALS — BP 145/85 | HR 92

## 2020-11-17 DIAGNOSIS — Z9181 History of falling: Secondary | ICD-10-CM

## 2020-11-17 DIAGNOSIS — R2681 Unsteadiness on feet: Secondary | ICD-10-CM | POA: Diagnosis not present

## 2020-11-17 DIAGNOSIS — R2689 Other abnormalities of gait and mobility: Secondary | ICD-10-CM | POA: Diagnosis not present

## 2020-11-17 DIAGNOSIS — M6281 Muscle weakness (generalized): Secondary | ICD-10-CM | POA: Diagnosis not present

## 2020-11-17 NOTE — Therapy (Addendum)
[facility] [address] [place], [place], [address] Phone: [phone]   Fax:  [phone]  Physical Therapy Treatment  Patient Details  Name: Shane Espinoza MRN: [id number] Date of Birth: 11-Jan-1941 Referring Provider (PT): Letta Pate Luanna Salk, MD (followed by)   Encounter Date: 11/17/2020   PT End of Session - 11/17/20 1555    Visit Number 20    Number of Visits 27    Date for PT Re-Evaluation 12/27/20   updated POC for 6 weeks, Cert for 60 days   Authorization Type [employer]    Progress Note Due on Visit 29   completed on 19th visit   PT Start Time 1401    PT Stop Time 1445    PT Time Calculation (min) 44 min    Equipment Utilized During Treatment Gait belt    Activity Tolerance Patient tolerated treatment well    Behavior During Therapy WFL for tasks assessed/performed           Past Medical History:  Diagnosis Date  . Anxiety   . Atrial fibrillation ([place])   . Biceps tendon tear    right  . CAD (coronary artery disease)    [name]  . Gout   . Hypertension   . 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 CARDIAC CATHETERIZATION  03/02/2011   Recommended CABG  . COLON SURGERY    . CORONARY ARTERY BYPASS GRAFT  03/07/2011   DR.VAN  TRIGHT  . EYE SURGERY    . LEXISCAN MYOVIEW  02/20/2012   EKG negative for ischemia, noraml study  . TRANSTHORACIC ECHOCARDIOGRAM  02/20/2012   EF 50-55%, mild-moderate concentric LVH, LA moderately dilated, moderate mitral regurg  . VASCULAR DOPPLER  03/03/2011   Nosignificant extracranial carotid artery stenosis    Vitals:   11/17/20 1441  BP: (!) 145/85  Pulse: 92     Subjective Assessment - 11/17/20 1405    Subjective Saw the cardiologist and they increased his Lasix. Has been sleeping in his bed now for over a week. Pt's daughter will be going to get the rollator soon.    Patient is accompained  by: Family member   spouse Shane Espinoza   Pertinent History CAF, CAD s/p CABG 2012, CVA 2015, A fib- on Xarelto, Severe tricompartmental Right knee OA    Limitations Standing;Walking    How long can you walk comfortably? has been walking 20' at home with RW    Patient Stated Goals wants to be able to walk by himself.    Currently in Pain? No/denies              [facility] PT Assessment - 11/17/20 1414      Berg Balance Test   Sitting with Back Unsupported but Feet Supported on Floor or Stool Able to sit safely and securely 2 minutes      Timed Up and Go Test   Normal TUG (seconds) --   see values below   TUG Comments 24.28 with rollator, 23 seconds with RW                         [facility] Adult PT Treatment/Exercise - 11/17/20 1414      Transfers   Transfers Sit to Stand;Stand to Sit    Sit to Stand 5: Supervision    Sit to Stand Details Verbal cues for technique;Verbal cues  for sequencing    Five time sit to stand comments  20.91 seconds with UE support at chair    Stand to Sit 5: Supervision      Ambulation/Gait   Ambulation/Gait Yes    Ambulation/Gait Assistance 5: Supervision    Ambulation Distance (Feet) 115 Feet    Assistive device Rollator    Gait Pattern Decreased step length - left;Decreased stance time - right;Decreased weight shift to right;Step-through pattern;Step-to pattern    Ambulation Surface Level;Indoor    Gait Comments provided new tennis balls for pt's RW      Berg Balance Test   Sit to Stand Able to stand  independently using hands    Standing Unsupported Able to stand safely 2 minutes    Sitting with Back Unsupported but Feet Supported on Floor or Stool Able to sit safely and securely 2 minutes    Stand to Sit Sits safely with minimal use of hands    Transfers Able to transfer safely, definite need of hands    Standing Unsupported with Eyes Closed Able to stand 10 seconds safely    Standing Ubsupported with Feet Together Able to place feet  together independently and stand 1 minute safely    From Standing, Reach Forward with Outstretched Arm Can reach confidently >25 cm (10")    From Standing Position, Pick up Object from Floor Unable to pick up and needs supervision    From Standing Position, Turn to Look Behind Over each Shoulder Looks behind from both sides and weight shifts well    Turn 360 Degrees Needs close supervision or verbal cueing   10.44 seconds to R, 17.5 seconds to L   Standing Unsupported, Alternately Place Feet on Step/Stool Able to complete >2 steps/needs minimal assist    Standing Unsupported, One Foot in Front Able to plae foot ahead of the other independently and hold 30 seconds    Standing on One Leg Tries to lift leg/unable to hold 3 seconds but remains standing independently    Total Score 41                  PT Education - 11/17/20 1555    Education Details progress towards goals    Person(s) Educated Spouse;Patient    Methods Explanation    Comprehension Verbalized understanding            PT Short Term Goals - 11/17/20 1413      PT SHORT TERM GOAL #1   Title Pt will perform 5x sit <> stand in 20 seconds or less with UE support  in order to demo improved functional mobility. ALL STGS DUE: 11/18/20    Baseline 23.93 seconds; 20.91 seconds on 11/17/20    Time 4    Period Weeks    Status Not Met    Target Date 11/18/20      PT SHORT TERM GOAL #2   Title Pt will improve BERG to at least a 42/56 to decr fall risk.    Baseline 41/56 on 11/17/20    Time 3    Period Weeks    Status Not Met      PT SHORT TERM GOAL #3   Title Pt/spouse will be educating on Fall Prevention to promote improved safety within the home    Baseline Dependent    Time 3    Period Weeks    Status Revised      PT SHORT TERM GOAL #4   Title Pt will perform TUG in  20 seconds or less with RW in order to demo decr fall risk.    Baseline 23.69 secs; 24.28 with rollator, 23 seconds with RW on 11/17/20    Time 3     Period Weeks    Status Not Met             PT Long Term Goals - 10/28/20 1506      PT LONG TERM GOAL #1   Title Pt will be independent with final HEP in order to build upon functional gains made in therapy. ALL LTGS DUE: 12/09/20    Baseline will benefit from progress HEP    Time 6    Period Weeks    Status On-going    Target Date 12/09/20      PT LONG TERM GOAL #2   Title Pt will improve BERG to at least a 45/56 to demo improved balance and reduced fall risk    Baseline 26/56; 38/56    Time 6    Period Weeks    Status Revised      PT LONG TERM GOAL #3   Title Pt will ambulate at least 1000' with LRAD with supervision over unlevel surfaces in order to demo improved community mobility.    Baseline 600' ft with RW outdoors, supervision    Time 6    Period Weeks    Status Revised      PT LONG TERM GOAL #4   Title Pt will perform TUG in 15 seconds or less with RW vs. LRAD in order to demo decr fall risk.    Baseline 47.06 with RW, 25.25 seconds on 09/27/20; 23.69 with RW 10/25/20    Time 8    Period Weeks    Status Revised      PT LONG TERM GOAL #5   Title Pt will improve gait speed with RW to at least 2.0 ft/sec in order to demo decr fall risk.    Baseline 1.41 ft/sec with RW; 1.94 ft/sec with RW    Time 8    Period Weeks    Status On-going      PT LONG TERM GOAL #6   Title Pt will perform 5x sit <> stand in 15 seconds or less with UE support in order to demo improved functional mobility.    Baseline 40.13 seconds; 24.18 secs with UE support    Time 6    Period Weeks    Status Revised      PT LONG TERM GOAL #7   Title Pt will improve FOTO score to a 64 in order to demo improved function.    Baseline 55    Time 6    Period Weeks    Status On-going                11/17/20 1603  Plan  Clinical Impression Statement Began to assess pt's STGs today. PT did not meet STGs #1-2 or #4, however, pt with improvements in measures, just not quite to goal level. Pt  improved BERG score to a 41/56 (was previously 38/56). Pt also with improvement of 5x sit <> stand to 20.91 seconds (was previously 23.93 seconds). Pt plans to be getting his own rollator soon. Will continue to progress towards LTGs.  Personal Factors and Comorbidities Comorbidity 3+;Past/Current Experience  Comorbidities CAF, CAD s/p CABG 2012, CVA 2015, A fib- on Xarelto, R ankle fx  Examination-Activity Limitations Stairs;Stand;Transfers;Squat;Locomotion Level;Carry  Examination-Participation Restrictions Community Activity;Cleaning  Pt will benefit from skilled therapeutic intervention  in order to improve on the following deficits Abnormal gait;Decreased balance;Decreased activity tolerance;Decreased coordination;Decreased knowledge of use of DME;Decreased range of motion;Difficulty walking;Decreased strength;Hypomobility;Postural dysfunction  Stability/Clinical Decision Making Evolving/Moderate complexity  Rehab Potential Good  PT Frequency 2x / week  PT Duration 8 weeks  PT Treatment/Interventions ADLs/Self Care Home Management;Gait training;DME Instruction;Functional mobility training;Therapeutic activities;Neuromuscular re-education;Balance training;Stair training;Therapeutic exercise;Patient/family education;Passive range of motion;Vestibular;Manual techniques  PT Next Visit Plan Educate on Fall Prevention. Education/Practice on how to get up from fall.Complete SciFit. balance exercises in // bars (compliant surfaces, retro gait/side stepping and decr UE support). continue to work on LE strengthening (and ankle strength/ROM).  PT Home Exercise Plan Access Code: [email or web address]  Consulted and Agree with Plan of Care Patient;Family member/caregiver  Family Member Consulted wife susan       Patient will benefit from skilled therapeutic intervention in order to improve the following deficits and impairments:     Visit Diagnosis: Unsteadiness on feet  Other abnormalities of gait and  mobility  Muscle weakness (generalized)  History of falling     Problem List Patient Active Problem List   Diagnosis Date Noted  . Diabetes ([place]) 08/30/2020  . Ankle fracture, right 08/30/2020  . Knee pain 08/30/2020  . Stroke (cerebrum) ([place]) 08/11/2020  . Acute CVA (cerebrovascular accident) ([place]) 08/06/2020  . TIA (transient ischemic attack) 08/05/2020  . Gout 12/07/2013  . Atrial fibrillation ([place]) 12/07/2013  . Acute thrombotic stroke ([place]) 09/23/2013  . CVA (cerebral infarction) 09/23/2013  . Hyperlipidemia with target LDL less than 70 04/16/2013  . CAD (coronary artery disease)   . Anxiety   . Hypertension   . S/P CABG x 4     Arliss Journey, PT, DPT  11/17/2020, 3:58 PM  [facility] [address] [place], [place], [address] Phone: [phone]   Fax:  [phone]  Name: HOY FALLERT MRN: [id number] Date of Birth: [birth year]

## 2020-11-21 ENCOUNTER — Encounter: Payer: Self-pay | Admitting: Cardiovascular Disease

## 2020-11-22 ENCOUNTER — Other Ambulatory Visit: Payer: Self-pay

## 2020-11-22 ENCOUNTER — Ambulatory Visit: Payer: Medicare HMO

## 2020-11-22 DIAGNOSIS — R2689 Other abnormalities of gait and mobility: Secondary | ICD-10-CM | POA: Diagnosis not present

## 2020-11-22 DIAGNOSIS — M6281 Muscle weakness (generalized): Secondary | ICD-10-CM

## 2020-11-22 DIAGNOSIS — R2681 Unsteadiness on feet: Secondary | ICD-10-CM | POA: Diagnosis not present

## 2020-11-22 DIAGNOSIS — Z9181 History of falling: Secondary | ICD-10-CM | POA: Diagnosis not present

## 2020-11-22 NOTE — Therapy (Signed)
[facility] [address] [place], [place], [address] Phone: [phone]   Fax:  [phone]  Physical Therapy Treatment  Patient Details  Name: Shane Espinoza MRN: [id number] Date of Birth: [birth year] Referring Provider (PT): Letta Pate Luanna Salk, MD (followed by)   Encounter Date: 11/22/2020   PT End of Session - 11/22/20 1324    Visit Number 21    Number of Visits 27    Date for PT Re-Evaluation 12/27/20   updated POC for 6 weeks, Cert for 60 days   Authorization Type [employer] Medicare    Progress Note Due on Visit 29   completed on 19th visit   PT Start Time 1317    PT Stop Time 1400    PT Time Calculation (min) 43 min    Equipment Utilized During Treatment Gait belt    Activity Tolerance Patient tolerated treatment well    Behavior During Therapy WFL for tasks assessed/performed           Past Medical History:  Diagnosis Date  . Anxiety   . Atrial fibrillation ([place])   . Biceps tendon tear    right  . CAD (coronary artery disease)    [name]  . Gout   . Hypertension   . 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 CARDIAC CATHETERIZATION  03/02/2011   Recommended CABG  . COLON SURGERY    . CORONARY ARTERY BYPASS GRAFT  03/07/2011   DR.VAN  TRIGHT  . EYE SURGERY    . LEXISCAN MYOVIEW  02/20/2012   EKG negative for ischemia, noraml study  . TRANSTHORACIC ECHOCARDIOGRAM  02/20/2012   EF 50-55%, mild-moderate concentric LVH, LA moderately dilated, moderate mitral regurg  . VASCULAR DOPPLER  03/03/2011   Nosignificant extracranial carotid artery stenosis    There were no vitals filed for this visit.   Subjective Assessment - 11/22/20 1321    Subjective Patient reports one blister that is still leaking on the legs, reports all the other are healing. Reports some wounds on the arm due to the dog. Reports daughter is going to get rollator.     Patient is accompained by: Family member   [name]   Pertinent History CAF, CAD s/p CABG 2012, CVA 2015, A fib- on Xarelto, Severe tricompartmental Right knee OA    Limitations Standing;Walking    How long can you walk comfortably? has been walking 20' at home with RW    Patient Stated Goals wants to be able to walk by himself.    Currently in Pain? No/denies                 [facility] Adult PT Treatment/Exercise - 11/22/20 0001      Transfers   Transfers Sit to Stand;Stand to Sit    Sit to Stand 5: Supervision;4: Min guard    Stand to Sit 5: Supervision;4: Min guard    Comments completed sit <> stand training from elevated mat, working on improved forward lean and proper position to promote BLE strengthening. Completed x 10 reps with intermittent rest breaks throughout. PT faciliating for improved forward lean and verbal cues "nose over toes" to promote improved techinque.      Ambulation/Gait   Ambulation/Gait Yes    Ambulation/Gait Assistance 5: Supervision    Ambulation/Gait Assistance Details completed ambulation x 200 ft with rollator, patient contineu to demo proper use of AD and safety  with completion. Began working on stopping during mid ambulation, locking brakes, and safely turning to sit on rollator to simulate completion outside of therapy session upon patient obtaining personal rollator. Completed x 2 reps, with wall behind rollator, and out in the open to promote simulate various scenarios. Able to complete with supervision.    Ambulation Distance (Feet) 200 Feet    Assistive device Rollator    Gait Pattern Decreased step length - left;Decreased stance time - right;Decreased weight shift to right;Step-through pattern;Step-to pattern    Ambulation Surface Level;Indoor      Therapeutic Activites    Therapeutic Activities Other Therapeutic Activities    Other Therapeutic Activities PT educating patient and wife on fall prevention strategies and provided fall prevention  checklist for home, with patient and wife verbalized understanding after education provided. PT also providing handout on techniques of how to get up from fall, PT requesting to complete today, but patient would like to wait until another session at this time. PT providing verbal education and demonstration.                  PT Education - 11/22/20 1913    Education Details Fall Prevention; How to get up from fall    Person(s) Educated Patient;Spouse    Methods Explanation;Demonstration;Handout    Comprehension Verbalized understanding;Need further instruction            PT Short Term Goals - 11/17/20 1413      PT SHORT TERM GOAL #1   Title Pt will perform 5x sit <> stand in 20 seconds or less with UE support  in order to demo improved functional mobility. ALL STGS DUE: 11/18/20    Baseline 23.93 seconds; 20.91 seconds on 11/17/20    Time 4    Period Weeks    Status Not Met    Target Date 11/18/20      PT SHORT TERM GOAL #2   Title Pt will improve BERG to at least a 42/56 to decr fall risk.    Baseline 41/56 on 11/17/20    Time 3    Period Weeks    Status Not Met      PT SHORT TERM GOAL #3   Title Pt/spouse will be educating on Fall Prevention to promote improved safety within the home    Baseline Dependent    Time 3    Period Weeks    Status Revised      PT SHORT TERM GOAL #4   Title Pt will perform TUG in 20 seconds or less with RW in order to demo decr fall risk.    Baseline 23.69 secs; 24.28 with rollator, 23 seconds with RW on 11/17/20    Time 3    Period Weeks    Status Not Met             PT Long Term Goals - 10/28/20 1506      PT LONG TERM GOAL #1   Title Pt will be independent with final HEP in order to build upon functional gains made in therapy. ALL LTGS DUE: 12/09/20    Baseline will benefit from progress HEP    Time 6    Period Weeks    Status On-going    Target Date 12/09/20      PT LONG TERM GOAL #2   Title Pt will improve BERG to at least  a 45/56 to demo improved balance and reduced fall risk    Baseline 26/56; 38/56  Time 6    Period Weeks    Status Revised      PT LONG TERM GOAL #3   Title Pt will ambulate at least 1000' with LRAD with supervision over unlevel surfaces in order to demo improved community mobility.    Baseline 600' ft with RW outdoors, supervision    Time 6    Period Weeks    Status Revised      PT LONG TERM GOAL #4   Title Pt will perform TUG in 15 seconds or less with RW vs. LRAD in order to demo decr fall risk.    Baseline 47.06 with RW, 25.25 seconds on 09/27/20; 23.69 with RW 10/25/20    Time 8    Period Weeks    Status Revised      PT LONG TERM GOAL #5   Title Pt will improve gait speed with RW to at least 2.0 ft/sec in order to demo decr fall risk.    Baseline 1.41 ft/sec with RW; 1.94 ft/sec with RW    Time 8    Period Weeks    Status On-going      PT LONG TERM GOAL #6   Title Pt will perform 5x sit <> stand in 15 seconds or less with UE support in order to demo improved functional mobility.    Baseline 40.13 seconds; 24.18 secs with UE support    Time 6    Period Weeks    Status Revised      PT LONG TERM GOAL #7   Title Pt will improve FOTO score to a 64 in order to demo improved function.    Baseline 55    Time 6    Period Weeks    Status On-going                 Plan - 11/22/20 1914    Clinical Impression Statement Today's skilled PT session included education of fall prevention, with handout provided for at home use. PT requesting to complete floor <> stand transfer to further promote fall prvention training and how to get up from fall, with patient requesting to complete at future session. Therefore PT educating verbally and handout provided. Continued gait training w/ rollator and proper technique with turning to sit on rollator. Will continue to progress toward all LTGs.    Personal Factors and Comorbidities Comorbidity 3+;Past/Current Experience    Comorbidities  CAF, CAD s/p CABG 2012, CVA 2015, A fib- on Xarelto, R ankle fx    Examination-Activity Limitations Stairs;Stand;Transfers;Squat;Locomotion Level;Carry    Examination-Participation Restrictions Community Activity;Cleaning    Stability/Clinical Decision Making Evolving/Moderate complexity    Rehab Potential Good    PT Frequency 2x / week    PT Duration 8 weeks    PT Treatment/Interventions ADLs/Self Care Home Management;Gait training;DME Instruction;Functional mobility training;Therapeutic activities;Neuromuscular re-education;Balance training;Stair training;Therapeutic exercise;Patient/family education;Passive range of motion;Vestibular;Manual techniques    PT Next Visit Plan Education/Practice on how to get up from fall.Complete SciFit. balance exercises in // bars (compliant surfaces, retro gait/side stepping and decr UE support). continue to work on LE strengthening (and ankle strength/ROM).    PT Home Exercise Plan Access Code: [email or web address]    Consulted and Agree with Plan of Care Patient;Family member/caregiver    Family Member Consulted wife susan           Patient will benefit from skilled therapeutic intervention in order to improve the following deficits and impairments:  Abnormal gait,Decreased balance,Decreased activity tolerance,Decreased coordination,Decreased knowledge of use of  DME,Decreased range of motion,Difficulty walking,Decreased strength,Hypomobility,Postural dysfunction  Visit Diagnosis: Other abnormalities of gait and mobility  Muscle weakness (generalized)  Unsteadiness on feet  History of falling     Problem List Patient Active Problem List   Diagnosis Date Noted  . Diabetes ([place]) 08/30/2020  . Ankle fracture, right 08/30/2020  . Knee pain 08/30/2020  . Stroke (cerebrum) ([place]) 08/11/2020  . Acute CVA (cerebrovascular accident) ([place]) 08/06/2020  . TIA (transient ischemic attack) 08/05/2020  . Gout 12/07/2013  . Atrial fibrillation ([place]) 12/07/2013  .  Acute thrombotic stroke ([place]) 09/23/2013  . CVA (cerebral infarction) 09/23/2013  . Hyperlipidemia with target LDL less than 70 04/16/2013  . CAD (coronary artery disease)   . Anxiety   . Hypertension   . S/P CABG x 4     Jones Bales, PT, DPT 11/22/2020, 7:17 PM  [facility] [address] [place], [place], [address] Phone: [phone]   Fax:  [phone]  Name: Shane Espinoza MRN: [id number] Date of Birth: 23-Aug-1940

## 2020-11-25 ENCOUNTER — Other Ambulatory Visit: Payer: Self-pay

## 2020-11-25 ENCOUNTER — Ambulatory Visit: Payer: Medicare HMO

## 2020-11-25 DIAGNOSIS — M6281 Muscle weakness (generalized): Secondary | ICD-10-CM

## 2020-11-25 DIAGNOSIS — R2681 Unsteadiness on feet: Secondary | ICD-10-CM

## 2020-11-25 DIAGNOSIS — R2689 Other abnormalities of gait and mobility: Secondary | ICD-10-CM

## 2020-11-25 DIAGNOSIS — Z9181 History of falling: Secondary | ICD-10-CM | POA: Diagnosis not present

## 2020-11-25 NOTE — Therapy (Signed)
[facility] [address] [place], [place], [address] Phone: [phone]   Fax:  [phone]  Physical Therapy Treatment  Patient Details  Name: Shane Espinoza MRN: [id number] Date of Birth: 21-Oct-1940 Referring Provider (PT): Letta Pate Luanna Salk, MD (followed by)   Encounter Date: 11/25/2020   PT End of Session - 11/25/20 1405    Visit Number 22    Number of Visits 27    Date for PT Re-Evaluation 12/27/20   updated POC for 6 weeks, Cert for 60 days   Authorization Type [employer]    Progress Note Due on Visit 29   completed on 19th visit   PT Start Time 1400    PT Stop Time 1445    PT Time Calculation (min) 45 min    Equipment Utilized During Treatment Gait belt    Activity Tolerance Patient tolerated treatment well    Behavior During Therapy [facility] for tasks assessed/performed           Past Medical History:  Diagnosis Date  . Anxiety   . Atrial fibrillation ([place])   . Biceps tendon tear    right  . CAD (coronary artery disease)    [name]  . Gout   . Hypertension   . 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 CARDIAC CATHETERIZATION  03/02/2011   Recommended CABG  . COLON SURGERY    . CORONARY ARTERY BYPASS GRAFT  03/07/2011   DR.VAN  TRIGHT  . EYE SURGERY    . LEXISCAN MYOVIEW  02/20/2012   EKG negative for ischemia, noraml study  . TRANSTHORACIC ECHOCARDIOGRAM  02/20/2012   EF 50-55%, mild-moderate concentric LVH, LA moderately dilated, moderate mitral regurg  . VASCULAR DOPPLER  03/03/2011   Nosignificant extracranial carotid artery stenosis    There were no vitals filed for this visit.   Subjective Assessment - 11/25/20 1404    Subjective Patient reports no new changes. No falls to report. Blisters continue to heal.    Patient is accompained by: Family member   [name]   Pertinent History CAF, CAD s/p CABG 2012, CVA 2015, A  fib- on Xarelto, Severe tricompartmental Right knee OA    Limitations Standing;Walking    How long can you walk comfortably? has been walking 20' at home with RW    Patient Stated Goals wants to be able to walk by himself.    Currently in Pain? No/denies             [facility] Adult PT Treatment/Exercise - 11/25/20 0001      Transfers   Transfers Sit to Stand;Stand to Sit    Sit to Stand 5: Supervision;4: Min guard    Stand to Sit 5: Supervision;4: Min guard      Ambulation/Gait   Ambulation/Gait Yes    Ambulation/Gait Assistance 5: Supervision;4: Min guard    Ambulation/Gait Assistance Details ambulating with RW throughout session, with SBQC in // bars for safety completed gait trianing with [facility] working on improved sequencing and step length, 10' x 7 reps, increased fatigue and buckling in RLE noted at end, requiring seated rest break. Patient able to demo improved sequencing today.    Ambulation Distance (Feet) 10 Feet   x 7 with SBQC, clinic distance with RW   Assistive device Rolling walker;Small based quad cane    Gait Pattern Decreased step length - left;Decreased stance time - right;Decreased  weight shift to right;Step-through pattern;Step-to pattern    Ambulation Surface Level;Indoor      Therapeutic Activites    Therapeutic Activities Other Therapeutic Activities    Other Therapeutic Activities Completed simulation of floor <> sit transfer with red mat, patient able to get onto floor with supervision. PT educating on when having a fall to scoot on bottom or to roll onto hand/knees and crawl to nearest stable chair/surface. Then to place LLE into hip/knee flexion and get foot onto ground then with LLE and UE support to push up into standing. Completed x 2 reps with PT providing close supervision but no physical assistance required. PT educating wife on how to properly assist and use of gait belt in case of help needed at home.      Neuro Re-ed    Neuro Re-ed Details  standing  balance in // bars: completed standing on airex without UE support x 1 minute, then progressed to horizontal/vertical head turns x 10 reps. Then with eyes closed 3 x 20-30 seconds, increased postural sway with vision removed      Exercises   Exercises Knee/Hip      Knee/Hip Exercises: Aerobic   Nustep Completed NuStep on Level 3 x 5 minutes with BLE only. PT educating on proper use of machine, with educating on how to adjust seat/handle bars and adjust resistance. Patient verbalized understanding. No pain with completion. PT educating on purpose of NuStep and returning to [employer]/ silver sneakers upon d/c.                  PT Education - 11/25/20 1457    Education Details Floor <> Sit Transfer in case of fall    Person(s) Educated Patient;Spouse    Methods Explanation;Demonstration    Comprehension Verbalized understanding;Returned demonstration            PT Short Term Goals - 11/17/20 1413      PT SHORT TERM GOAL #1   Title Pt will perform 5x sit <> stand in 20 seconds or less with UE support  in order to demo improved functional mobility. ALL STGS DUE: 11/18/20    Baseline 23.93 seconds; 20.91 seconds on 11/17/20    Time 4    Period Weeks    Status Not Met    Target Date 11/18/20      PT SHORT TERM GOAL #2   Title Pt will improve BERG to at least a 42/56 to decr fall risk.    Baseline 41/56 on 11/17/20    Time 3    Period Weeks    Status Not Met      PT SHORT TERM GOAL #3   Title Pt/spouse will be educating on Fall Prevention to promote improved safety within the home    Baseline Dependent    Time 3    Period Weeks    Status Revised      PT SHORT TERM GOAL #4   Title Pt will perform TUG in 20 seconds or less with RW in order to demo decr fall risk.    Baseline 23.69 secs; 24.28 with rollator, 23 seconds with RW on 11/17/20    Time 3    Period Weeks    Status Not Met             PT Long Term Goals - 10/28/20 1506      PT LONG TERM GOAL #1   Title Pt will  be independent with final HEP in order to build upon  functional gains made in therapy. ALL LTGS DUE: 12/09/20    Baseline will benefit from progress HEP    Time 6    Period Weeks    Status On-going    Target Date 12/09/20      PT LONG TERM GOAL #2   Title Pt will improve BERG to at least a 45/56 to demo improved balance and reduced fall risk    Baseline 26/56; 38/56    Time 6    Period Weeks    Status Revised      PT LONG TERM GOAL #3   Title Pt will ambulate at least 1000' with LRAD with supervision over unlevel surfaces in order to demo improved community mobility.    Baseline 600' ft with RW outdoors, supervision    Time 6    Period Weeks    Status Revised      PT LONG TERM GOAL #4   Title Pt will perform TUG in 15 seconds or less with RW vs. LRAD in order to demo decr fall risk.    Baseline 47.06 with RW, 25.25 seconds on 09/27/20; 23.69 with RW 10/25/20    Time 8    Period Weeks    Status Revised      PT LONG TERM GOAL #5   Title Pt will improve gait speed with RW to at least 2.0 ft/sec in order to demo decr fall risk.    Baseline 1.41 ft/sec with RW; 1.94 ft/sec with RW    Time 8    Period Weeks    Status On-going      PT LONG TERM GOAL #6   Title Pt will perform 5x sit <> stand in 15 seconds or less with UE support in order to demo improved functional mobility.    Baseline 40.13 seconds; 24.18 secs with UE support    Time 6    Period Weeks    Status Revised      PT LONG TERM GOAL #7   Title Pt will improve FOTO score to a 64 in order to demo improved function.    Baseline 55    Time 6    Period Weeks    Status On-going                 Plan - 11/25/20 1457    Clinical Impression Statement Completed simulation/practice floor <> sit transfer in case of a fall occuring within the home, completed x 2 reps with verbal cues but no assistance required. Continued gait training with SBQC with improved sequencing noted today with minimal cueing. Completed NuStep  with training on how to set up and complete without assitance, to allow for return to [employer] upon d/c.    Personal Factors and Comorbidities Comorbidity 3+;Past/Current Experience    Comorbidities CAF, CAD s/p CABG 2012, CVA 2015, A fib- on Xarelto, R ankle fx    Examination-Activity Limitations Stairs;Stand;Transfers;Squat;Locomotion Level;Carry    Examination-Participation Restrictions Community Activity;Cleaning    Stability/Clinical Decision Making Evolving/Moderate complexity    Rehab Potential Good    PT Frequency 2x / week    PT Duration 8 weeks    PT Treatment/Interventions ADLs/Self Care Home Management;Gait training;DME Instruction;Functional mobility training;Therapeutic activities;Neuromuscular re-education;Balance training;Stair training;Therapeutic exercise;Patient/family education;Passive range of motion;Vestibular;Manual techniques    PT Next Visit Plan Continue NuStep. balance exercises in // bars (compliant surfaces, retro gait/side stepping and decr UE support). continue to work on LE strengthening (and ankle strength/ROM).    PT Home Exercise Plan Access  Code: [email or web address]    Consulted and Agree with Plan of Care Patient;Family member/caregiver    Family Member Consulted wife susan           Patient will benefit from skilled therapeutic intervention in order to improve the following deficits and impairments:  Abnormal gait,Decreased balance,Decreased activity tolerance,Decreased coordination,Decreased knowledge of use of DME,Decreased range of motion,Difficulty walking,Decreased strength,Hypomobility,Postural dysfunction  Visit Diagnosis: Other abnormalities of gait and mobility  Muscle weakness (generalized)  Unsteadiness on feet     Problem List Patient Active Problem List   Diagnosis Date Noted  . Diabetes ([place]) 08/30/2020  . Ankle fracture, right 08/30/2020  . Knee pain 08/30/2020  . Stroke (cerebrum) ([place]) 08/11/2020  . Acute CVA (cerebrovascular  accident) ([place]) 08/06/2020  . TIA (transient ischemic attack) 08/05/2020  . Gout 12/07/2013  . Atrial fibrillation ([place]) 12/07/2013  . Acute thrombotic stroke ([place]) 09/23/2013  . CVA (cerebral infarction) 09/23/2013  . Hyperlipidemia with target LDL less than 70 04/16/2013  . CAD (coronary artery disease)   . Anxiety   . Hypertension   . S/P CABG x 4     Jones Bales, PT, DPT 11/25/2020, 3:06 PM  [facility] [address] [place], [place], [address] Phone: [phone]   Fax:  [phone]  Name: Shane Espinoza MRN: [id number] Date of Birth: 09/30/1940

## 2020-11-26 ENCOUNTER — Other Ambulatory Visit: Payer: Self-pay | Admitting: Cardiovascular Disease

## 2020-11-26 ENCOUNTER — Telehealth: Payer: Self-pay | Admitting: Physical Therapy

## 2020-11-26 DIAGNOSIS — R2681 Unsteadiness on feet: Secondary | ICD-10-CM

## 2020-11-26 DIAGNOSIS — R2689 Other abnormalities of gait and mobility: Secondary | ICD-10-CM

## 2020-11-26 DIAGNOSIS — I639 Cerebral infarction, unspecified: Secondary | ICD-10-CM

## 2020-11-26 NOTE — Telephone Encounter (Signed)
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 The patient would benefit from an order for a 4 wheeled rolling walker in order to assist with balance, decr pt's fall risk, improve endurance and gait. If you agree, please place an order in [facility] workque in [facility] or fax the order to [phone]. Thank you, Janann August, PT, DPT 11/26/20 2:38 PM    [facility] [address] [place], [place]  [address] Phone:  [phone] Fax:  [phone]

## 2020-11-30 ENCOUNTER — Ambulatory Visit: Payer: Medicare HMO

## 2020-11-30 ENCOUNTER — Other Ambulatory Visit: Payer: Self-pay

## 2020-11-30 VITALS — BP 147/81 | HR 92

## 2020-11-30 DIAGNOSIS — M6281 Muscle weakness (generalized): Secondary | ICD-10-CM | POA: Diagnosis not present

## 2020-11-30 DIAGNOSIS — Z9181 History of falling: Secondary | ICD-10-CM | POA: Diagnosis not present

## 2020-11-30 DIAGNOSIS — R2689 Other abnormalities of gait and mobility: Secondary | ICD-10-CM | POA: Diagnosis not present

## 2020-11-30 DIAGNOSIS — R2681 Unsteadiness on feet: Secondary | ICD-10-CM | POA: Diagnosis not present

## 2020-11-30 NOTE — Therapy (Signed)
[facility] [address] [place], [place], [address] Phone: [phone]   Fax:  [phone]  Physical Therapy Treatment  Patient Details  Name: Shane Espinoza MRN: [id number] Date of Birth: Jul 05, 1940 Referring Provider (PT): Letta Pate Luanna Salk, MD (followed by)   Encounter Date: 11/30/2020   PT End of Session - 11/30/20 1401    Visit Number 23    Number of Visits 27    Date for PT Re-Evaluation 12/27/20   updated POC for 6 weeks, Cert for 60 days   Authorization Type [employer]na Medicare    Progress Note Due on Visit 29   completed on 19th visit   PT Start Time 1400    PT Stop Time 1445    PT Time Calculation (min) 45 min    Equipment Utilized During Treatment Gait belt    Activity Tolerance Patient tolerated treatment well    Behavior During Therapy [facility] for tasks assessed/performed           Past Medical History:  Diagnosis Date  . Anxiety   . Atrial fibrillation ([place])   . Biceps tendon tear    right  . CAD (coronary artery disease)    [name]  . Gout   . Hypertension   . 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 CARDIAC CATHETERIZATION  03/02/2011   Recommended CABG  . COLON SURGERY    . CORONARY ARTERY BYPASS GRAFT  03/07/2011   DR.VAN  TRIGHT  . EYE SURGERY    . LEXISCAN MYOVIEW  02/20/2012   EKG negative for ischemia, noraml study  . TRANSTHORACIC ECHOCARDIOGRAM  02/20/2012   EF 50-55%, mild-moderate concentric LVH, LA moderately dilated, moderate mitral regurg  . VASCULAR DOPPLER  03/03/2011   Nosignificant extracranial carotid artery stenosis    Vitals:   11/30/20 1427  BP: (!) 147/81  Pulse: 92     Subjective Assessment - 11/30/20 1403    Subjective Wife reports blisters have healed for the most part. No other new changes/complaints. No falls.    Patient is accompained by: Family member   [name]   Pertinent History CAF,  CAD s/p CABG 2012, CVA 2015, A fib- on Xarelto, Severe tricompartmental Right knee OA    Limitations Standing;Walking    How long can you walk comfortably? has been walking 20' at home with RW    Patient Stated Goals wants to be able to walk by himself.    Currently in Pain? No/denies              [facility] Adult PT Treatment/Exercise - 11/30/20 0001      Transfers   Transfers Sit to Stand;Stand to Sit    Sit to Stand 5: Supervision    Stand to Sit 5: Supervision    Comments completed x 5 reps throughout session.      Ambulation/Gait   Ambulation/Gait Yes    Ambulation/Gait Assistance 5: Supervision    Ambulation/Gait Assistance Details Completed ambulation indoors and outdoors on unlevel paved surfaces x 600 ft with Rollator. Mild SOB at end of ambulation. Supervision throughout with ambulation. Vitals after ambulation: 147/81, HR: 92.    Ambulation Distance (Feet) 600 Feet    Assistive device Rollator    Gait Pattern Decreased step length - left;Decreased stance time - right;Decreased weight shift to right;Step-through pattern;Step-to pattern    Ambulation Surface Level;Indoor;Unlevel;Outdoor;Paved      Self-Care  Self-Care Other Self-Care Comments    Other Self-Care Comments  Wife requesting order for Rollator, PT provided order. Pt requesting to be weighed at end of session, PT ambulated with patient onto scale. Patient weighed 215 lbs today during session.      Exercises   Exercises Knee/Hip      Knee/Hip Exercises: Aerobic   Nustep Continued training on NuStep on Level 4 x 8 minutes with patient tolerating well. Continued to simulate patient returning to [employer] upon d/c, with teaching patient how to independently adjust handles and resistance to continue to build patient's comfort with utilizing equipment. Handout provided on settings for seat/handle bars and resistance level settings to further promote imrpovements in endurance. See patient instructions.            Handout Provided for NuStep:   NuStep Settings for [employer]:    Seat/Handle Settings  Seat: 11   Handles: 8 or 9. Do NOT have to use if only want to use legs.    Resistance Level  Set Resistance/Work Load to Level 4   Complete NuStep for 7-8 minutes, every week progress by adding on 1-2 minutes to further promote endurance. When able to complete for 15-20 minutes without difficulty, increase resistance by one notch.        PT Education - 11/30/20 1454    Education Details NuStep Settings    Person(s) Educated Patient;Spouse    Methods Explanation;Demonstration;Handout    Comprehension Verbalized understanding            PT Short Term Goals - 11/17/20 1413      PT SHORT TERM GOAL #1   Title Pt will perform 5x sit <> stand in 20 seconds or less with UE support  in order to demo improved functional mobility. ALL STGS DUE: 11/18/20    Baseline 23.93 seconds; 20.91 seconds on 11/17/20    Time 4    Period Weeks    Status Not Met    Target Date 11/18/20      PT SHORT TERM GOAL #2   Title Pt will improve BERG to at least a 42/56 to decr fall risk.    Baseline 41/56 on 11/17/20    Time 3    Period Weeks    Status Not Met      PT SHORT TERM GOAL #3   Title Pt/spouse will be educating on Fall Prevention to promote improved safety within the home    Baseline Dependent    Time 3    Period Weeks    Status Revised      PT SHORT TERM GOAL #4   Title Pt will perform TUG in 20 seconds or less with RW in order to demo decr fall risk.    Baseline 23.69 secs; 24.28 with rollator, 23 seconds with RW on 11/17/20    Time 3    Period Weeks    Status Not Met             PT Long Term Goals - 10/28/20 1506      PT LONG TERM GOAL #1   Title Pt will be independent with final HEP in order to build upon functional gains made in therapy. ALL LTGS DUE: 12/09/20    Baseline will benefit from progress HEP    Time 6    Period Weeks    Status On-going    Target Date 12/09/20       PT LONG TERM GOAL #2   Title Pt will improve  BERG to at least a 45/56 to demo improved balance and reduced fall risk    Baseline 26/56; 38/56    Time 6    Period Weeks    Status Revised      PT LONG TERM GOAL #3   Title Pt will ambulate at least 1000' with LRAD with supervision over unlevel surfaces in order to demo improved community mobility.    Baseline 600' ft with RW outdoors, supervision    Time 6    Period Weeks    Status Revised      PT LONG TERM GOAL #4   Title Pt will perform TUG in 15 seconds or less with RW vs. LRAD in order to demo decr fall risk.    Baseline 47.06 with RW, 25.25 seconds on 09/27/20; 23.69 with RW 10/25/20    Time 8    Period Weeks    Status Revised      PT LONG TERM GOAL #5   Title Pt will improve gait speed with RW to at least 2.0 ft/sec in order to demo decr fall risk.    Baseline 1.41 ft/sec with RW; 1.94 ft/sec with RW    Time 8    Period Weeks    Status On-going      PT LONG TERM GOAL #6   Title Pt will perform 5x sit <> stand in 15 seconds or less with UE support in order to demo improved functional mobility.    Baseline 40.13 seconds; 24.18 secs with UE support    Time 6    Period Weeks    Status Revised      PT LONG TERM GOAL #7   Title Pt will improve FOTO score to a 64 in order to demo improved function.    Baseline 55    Time 6    Period Weeks    Status On-going                 Plan - 11/30/20 1455    Clinical Impression Statement Today's skilled session included continued gait training outdoors on paved surfaces with rollator, supervision throughout. Vitals stable after long distance ambulation. Continued training with NuStep to promote independence and participation in endurance activity at [facility] upon d/c. Will continue to progress toward all LTGs.    Personal Factors and Comorbidities Comorbidity 3+;Past/Current Experience    Comorbidities CAF, CAD s/p CABG 2012, CVA 2015, A fib- on Xarelto, R ankle fx     Examination-Activity Limitations Stairs;Stand;Transfers;Squat;Locomotion Level;Carry    Examination-Participation Restrictions Community Activity;Cleaning    Stability/Clinical Decision Making Evolving/Moderate complexity    Rehab Potential Good    PT Frequency 2x / week    PT Duration 8 weeks    PT Treatment/Interventions ADLs/Self Care Home Management;Gait training;DME Instruction;Functional mobility training;Therapeutic activities;Neuromuscular re-education;Balance training;Stair training;Therapeutic exercise;Patient/family education;Passive range of motion;Vestibular;Manual techniques    PT Next Visit Plan Continue NuStep as Warm Up, educate patient/wife on seat adjustment. continue balance exercises in // bars (compliant surfaces, retro gait/side stepping and decr UE support). continue to work on LE strengthening (and ankle strength/ROM).    PT Home Exercise Plan Access Code: [email or web address]    Consulted and Agree with Plan of Care Patient;Family member/caregiver    Family Member Consulted wife susan           Patient will benefit from skilled therapeutic intervention in order to improve the following deficits and impairments:  Abnormal gait,Decreased balance,Decreased activity tolerance,Decreased coordination,Decreased knowledge of use of DME,Decreased range of motion,Difficulty  walking,Decreased strength,Hypomobility,Postural dysfunction  Visit Diagnosis: Other abnormalities of gait and mobility  Unsteadiness on feet  Muscle weakness (generalized)     Problem List Patient Active Problem List   Diagnosis Date Noted  . Diabetes ([place]) 08/30/2020  . Ankle fracture, right 08/30/2020  . Knee pain 08/30/2020  . Stroke (cerebrum) ([place]) 08/11/2020  . Acute CVA (cerebrovascular accident) ([place]) 08/06/2020  . TIA (transient ischemic attack) 08/05/2020  . Gout 12/07/2013  . Atrial fibrillation ([place]) 12/07/2013  . Acute thrombotic stroke ([place]) 09/23/2013  . CVA (cerebral infarction)  09/23/2013  . Hyperlipidemia with target LDL less than 70 04/16/2013  . CAD (coronary artery disease)   . Anxiety   . Hypertension   . S/P CABG x 4     Jones Bales, PT, DPT 11/30/2020, 3:02 PM  [facility] [address] [place], [place], [address] Phone: [phone]   Fax:  [phone]  Name: Shane Espinoza MRN: [id number] Date of Birth: 02/05/1941

## 2020-11-30 NOTE — Patient Instructions (Addendum)
NuStep Settings for [employer]:    Seat/Handle Settings  Seat: 11   Handles: 8 or 9. Do NOT have to use if only want to use legs.    Resistance Level  Set Resistance/Work Load to Level 4   Complete NuStep for 7-8 minutes, every week progress by adding on 1-2 minutes to further promote endurance. When able to complete for 15-20 minutes without difficulty, increase resistance by one notch.

## 2020-11-30 NOTE — Telephone Encounter (Signed)
Order placed and faxed for rolling walker.

## 2020-12-02 ENCOUNTER — Other Ambulatory Visit: Payer: Self-pay

## 2020-12-02 ENCOUNTER — Ambulatory Visit: Payer: Medicare HMO | Attending: Physician Assistant

## 2020-12-02 DIAGNOSIS — R2689 Other abnormalities of gait and mobility: Secondary | ICD-10-CM

## 2020-12-02 DIAGNOSIS — M6281 Muscle weakness (generalized): Secondary | ICD-10-CM | POA: Diagnosis not present

## 2020-12-02 DIAGNOSIS — R2681 Unsteadiness on feet: Secondary | ICD-10-CM | POA: Diagnosis not present

## 2020-12-02 NOTE — Therapy (Signed)
[facility] [address] [place], [place], [address] Phone: [phone]   Fax:  [phone]  Physical Therapy Treatment  Patient Details  Name: Shane Espinoza MRN: [id number] Date of Birth: [birth year] Referring Provider (PT): Letta Pate Luanna Salk, MD (followed by)   Encounter Date: 12/02/2020   PT End of Session - 12/02/20 1401    Visit Number 24    Number of Visits 27    Date for PT Re-Evaluation 12/27/20   updated POC for 6 weeks, Cert for 60 days   Authorization Type [employer]    Progress Note Due on Visit 29   completed on 19th visit   PT Start Time 1400    PT Stop Time 1444    PT Time Calculation (min) 44 min    Equipment Utilized During Treatment Gait belt    Activity Tolerance Patient tolerated treatment well    Behavior During Therapy WFL for tasks assessed/performed           Past Medical History:  Diagnosis Date  . Anxiety   . Atrial fibrillation ([place])   . Biceps tendon tear    right  . CAD (coronary artery disease)    [name]  . Gout   . Hypertension   . 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 CARDIAC CATHETERIZATION  03/02/2011   Recommended CABG  . COLON SURGERY    . CORONARY ARTERY BYPASS GRAFT  03/07/2011   DR.VAN  TRIGHT  . EYE SURGERY    . LEXISCAN MYOVIEW  02/20/2012   EKG negative for ischemia, noraml study  . TRANSTHORACIC ECHOCARDIOGRAM  02/20/2012   EF 50-55%, mild-moderate concentric LVH, LA moderately dilated, moderate mitral regurg  . VASCULAR DOPPLER  03/03/2011   Nosignificant extracranial carotid artery stenosis    There were no vitals filed for this visit.   Subjective Assessment - 12/02/20 1404    Subjective No new changes/complaints. No falls.    Patient is accompained by: Family member   [name]   Pertinent History CAF, CAD s/p CABG 2012, CVA 2015, A fib- on Xarelto, Severe tricompartmental  Right knee OA    Limitations Standing;Walking    How long can you walk comfortably? has been walking 20' at home with RW    Patient Stated Goals wants to be able to walk by himself.    Currently in Pain? No/denies            [facility] Adult PT Treatment/Exercise - 12/02/20 0001      Transfers   Transfers Sit to Stand;Stand to Sit    Sit to Stand 5: Supervision    Stand to Sit 5: Supervision    Comments completed sit <> stands from elevated mat x 5 reps,t hen lowered mat and completed x 3 reps. PT engaging patient in teach back method for sit <> stand technique.      Ambulation/Gait   Ambulation/Gait Yes    Ambulation/Gait Assistance 5: Supervision    Ambulation/Gait Assistance Details Completed ambulation beside countertop with light UE support x 6 laps down and back. Verbal cues for improved step length, and increase BOS as often tends to keep narrow BOS    Ambulation Distance (Feet) 60 Feet    Assistive device Rolling walker;None    Gait Pattern Decreased step length - left;Decreased stance time - right;Decreased weight shift to right;Step-through pattern;Step-to pattern    Ambulation Surface  Level;Indoor      Exercises   Exercises Knee/Hip      Knee/Hip Exercises: Aerobic   Nustep Completed training with wife and patient on NuStep and how to properly adjust for patient upon return to [facility]. Completed on Level with BLE Only x 7 minutes. Patient tolerating well. PT addressed any questions/concerns of patient. Vitals after completion: 142/88                  PT Education - 12/02/20 1416    Education Details How to properly adjust Nustep    Person(s) Educated Patient;Spouse    Methods Explanation;Demonstration    Comprehension Verbalized understanding;Returned demonstration            PT Short Term Goals - 11/17/20 1413      PT SHORT TERM GOAL #1   Title Pt will perform 5x sit <> stand in 20 seconds or less with UE support  in order to demo improved functional  mobility. ALL STGS DUE: 11/18/20    Baseline 23.93 seconds; 20.91 seconds on 11/17/20    Time 4    Period Weeks    Status Not Met    Target Date 11/18/20      PT SHORT TERM GOAL #2   Title Pt will improve BERG to at least a 42/56 to decr fall risk.    Baseline 41/56 on 11/17/20    Time 3    Period Weeks    Status Not Met      PT SHORT TERM GOAL #3   Title Pt/spouse will be educating on Fall Prevention to promote improved safety within the home    Baseline Dependent    Time 3    Period Weeks    Status Revised      PT SHORT TERM GOAL #4   Title Pt will perform TUG in 20 seconds or less with RW in order to demo decr fall risk.    Baseline 23.69 secs; 24.28 with rollator, 23 seconds with RW on 11/17/20    Time 3    Period Weeks    Status Not Met             PT Long Term Goals - 10/28/20 1506      PT LONG TERM GOAL #1   Title Pt will be independent with final HEP in order to build upon functional gains made in therapy. ALL LTGS DUE: 12/09/20    Baseline will benefit from progress HEP    Time 6    Period Weeks    Status On-going    Target Date 12/09/20      PT LONG TERM GOAL #2   Title Pt will improve BERG to at least a 45/56 to demo improved balance and reduced fall risk    Baseline 26/56; 38/56    Time 6    Period Weeks    Status Revised      PT LONG TERM GOAL #3   Title Pt will ambulate at least 1000' with LRAD with supervision over unlevel surfaces in order to demo improved community mobility.    Baseline 600' ft with RW outdoors, supervision    Time 6    Period Weeks    Status Revised      PT LONG TERM GOAL #4   Title Pt will perform TUG in 15 seconds or less with RW vs. LRAD in order to demo decr fall risk.    Baseline 47.06 with RW, 25.25 seconds on 09/27/20; 23.69 with  RW 10/25/20    Time 8    Period Weeks    Status Revised      PT LONG TERM GOAL #5   Title Pt will improve gait speed with RW to at least 2.0 ft/sec in order to demo decr fall risk.     Baseline 1.41 ft/sec with RW; 1.94 ft/sec with RW    Time 8    Period Weeks    Status On-going      PT LONG TERM GOAL #6   Title Pt will perform 5x sit <> stand in 15 seconds or less with UE support in order to demo improved functional mobility.    Baseline 40.13 seconds; 24.18 secs with UE support    Time 6    Period Weeks    Status Revised      PT LONG TERM GOAL #7   Title Pt will improve FOTO score to a 64 in order to demo improved function.    Baseline 55    Time 6    Period Weeks    Status On-going                 Plan - 12/02/20 1417    Clinical Impression Statement PT continued education with patient and wife on how to properly adjust NuStep for patient, to allow for completion at [facility] upon d/c.  Continued gait training and balance activities with patient tolerating well.    Personal Factors and Comorbidities Comorbidity 3+;Past/Current Experience    Comorbidities CAF, CAD s/p CABG 2012, CVA 2015, A fib- on Xarelto, R ankle fx    Examination-Activity Limitations Stairs;Stand;Transfers;Squat;Locomotion Level;Carry    Examination-Participation Restrictions Community Activity;Cleaning    Stability/Clinical Decision Making Evolving/Moderate complexity    Rehab Potential Good    PT Frequency 2x / week    PT Duration 8 weeks    PT Treatment/Interventions ADLs/Self Care Home Management;Gait training;DME Instruction;Functional mobility training;Therapeutic activities;Neuromuscular re-education;Balance training;Stair training;Therapeutic exercise;Patient/family education;Passive range of motion;Vestibular;Manual techniques    PT Next Visit Plan Begin to check LTGs continue balance exercises in // bars (compliant surfaces, retro gait/side stepping and decr UE support). continue to work on LE strengthening (and ankle strength/ROM).    PT Home Exercise Plan Access Code: [email or web address]    Consulted and Agree with Plan of Care Patient;Family member/caregiver    Family Member Consulted  wife susan           Patient will benefit from skilled therapeutic intervention in order to improve the following deficits and impairments:  Abnormal gait,Decreased balance,Decreased activity tolerance,Decreased coordination,Decreased knowledge of use of DME,Decreased range of motion,Difficulty walking,Decreased strength,Hypomobility,Postural dysfunction  Visit Diagnosis: Other abnormalities of gait and mobility  Unsteadiness on feet  Muscle weakness (generalized)     Problem List Patient Active Problem List   Diagnosis Date Noted  . Diabetes ([place]) 08/30/2020  . Ankle fracture, right 08/30/2020  . Knee pain 08/30/2020  . 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 Acute CVA (cerebrovascular accident) ([place]) 08/06/2020  . TIA (transient ischemic attack) 08/05/2020  . Gout 12/07/2013  . Atrial fibrillation ([place]) 12/07/2013  . Acute thrombotic stroke ([place]) 09/23/2013  . CVA (cerebral infarction) 09/23/2013  . Hyperlipidemia with target LDL less than 70 04/16/2013  . CAD (coronary artery disease)   . Anxiety   . Hypertension   . S/P CABG x 4     Jones Bales, PT, DPT 12/02/2020, 2:45 PM  [facility] [address] [place], [place], [address] Phone:  [phone]   Fax:  [phone]  Name: Shane Espinoza MRN: [id number] Date of Birth: [birth year]

## 2020-12-06 ENCOUNTER — Ambulatory Visit: Payer: Medicare HMO

## 2020-12-06 ENCOUNTER — Other Ambulatory Visit: Payer: Self-pay

## 2020-12-06 DIAGNOSIS — R2689 Other abnormalities of gait and mobility: Secondary | ICD-10-CM

## 2020-12-06 DIAGNOSIS — M6281 Muscle weakness (generalized): Secondary | ICD-10-CM | POA: Diagnosis not present

## 2020-12-06 DIAGNOSIS — R2681 Unsteadiness on feet: Secondary | ICD-10-CM | POA: Diagnosis not present

## 2020-12-06 NOTE — Patient Instructions (Signed)
Access Code: [email or web address] URL: [email or web address] Date: 12/06/2020 Prepared by: Baldomero Lamy  Exercises Sit to Stand - 1 x daily - 7 x weekly - 2 sets - 10 reps Seated Calf Stretch with Strap - 2-3 x daily - 5 x weekly - 3 sets - 15-20 hold Romberg Stance with Head Nods - 1 x daily - 5 x weekly - 2 sets - 10 reps Romberg Stance with Head Rotation - 1 x daily - 5 x weekly - 2 sets - 10 reps Romberg Stance with Eyes Closed - 1 x daily - 5 x weekly - 1 sets - 3 reps - 15-20 seconds hold Heel Toe Raises with Counter Support - 1 x daily - 5 x weekly - 2 sets - 10 reps Standing March with Counter Support - 1 x daily - 5 x weekly - 2 sets - 10 reps Side Stepping with Counter Support - 1 x daily - 5 x weekly - 2 sets - 10 reps Side to Side Weight Shift with Overhead Reach and Counter Support - 1 x daily - 5 x weekly - 2 sets - 10 reps

## 2020-12-06 NOTE — Therapy (Signed)
[facility] [address] [place], [place], [address] Phone: [phone]   Fax:  [phone]  Physical Therapy Treatment  Patient Details  Name: Shane Espinoza MRN: [id number] Date of Birth: September 13, 1940 Referring Provider (PT): Letta Pate Luanna Salk, MD (followed by)   Encounter Date: 12/06/2020   PT End of Session - 12/06/20 1322    Visit Number 25    Number of Visits 27    Date for PT Re-Evaluation 12/27/20   updated POC for 6 weeks, Cert for 60 days   Authorization Type [employer] Medicare    Progress Note Due on Visit 29   completed on 19th visit   PT Start Time 1317    PT Stop Time 1400    PT Time Calculation (min) 43 min    Equipment Utilized During Treatment Gait belt    Activity Tolerance Patient tolerated treatment well    Behavior During Therapy WFL for tasks assessed/performed           Past Medical History:  Diagnosis Date  . Anxiety   . Atrial fibrillation ([place])   . Biceps tendon tear    right  . CAD (coronary artery disease)    [name]  . Gout   . Hypertension   . 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 CARDIAC CATHETERIZATION  03/02/2011   Recommended CABG  . COLON SURGERY    . CORONARY ARTERY BYPASS GRAFT  03/07/2011   DR.VAN  TRIGHT  . EYE SURGERY    . LEXISCAN MYOVIEW  02/20/2012   EKG negative for ischemia, noraml study  . TRANSTHORACIC ECHOCARDIOGRAM  02/20/2012   EF 50-55%, mild-moderate concentric LVH, LA moderately dilated, moderate mitral regurg  . VASCULAR DOPPLER  03/03/2011   Nosignificant extracranial carotid artery stenosis    There were no vitals filed for this visit.   Subjective Assessment - 12/06/20 1320    Subjective Patient reports was able to stand in church, and get up from a soft chair with improved ease. No falls. No other new changes/complaints.    Patient is accompained by: Family member   [name]    Pertinent History CAF, CAD s/p CABG 2012, CVA 2015, A fib- on Xarelto, Severe tricompartmental Right knee OA    Limitations Standing;Walking    How long can you walk comfortably? has been walking 20' at home with RW    Patient Stated Goals wants to be able to walk by himself.    Currently in Pain? No/denies             [facility] Adult PT Treatment/Exercise - 12/06/20 0001      Transfers   Transfers Sit to Stand;Stand to Sit    Sit to Stand 5: Supervision    Stand to Sit 5: Supervision      Ambulation/Gait   Ambulation/Gait Yes    Ambulation/Gait Assistance 5: Supervision    Ambulation/Gait Assistance Details Completed ambulation outdoors on unlevel surfaces x 800 ft with supervision with rollator. Patient reports fatigue at end of ambulation; Sp02: 98%, HR: 94 bpm after ambulation. Seated rest break required. Patient demonstrating proper use of Rollator with ambulation outdoors.    Ambulation Distance (Feet) 800 Feet    Assistive device Rollator    Gait Pattern Decreased step length - left;Decreased stance time - right;Decreased weight shift to right;Step-through pattern;Step-to pattern    Ambulation Surface Level;Indoor;Unlevel;Outdoor;Paved  Completed all of the following exercises as review of HEP. PT providing verbal cues for proper completion and providing patient with written tips for technique on handout given. Patient tolerating progression of HEP well. No questions/concerns at this time from patient/spouse regarding exercises.   Exercises Sit to Stand - 1 x daily - 7 x weekly - 2 sets - 10 reps Seated Calf Stretch with Strap - 2-3 x daily - 5 x weekly - 3 sets - 15-20 hold Romberg Stance with Head Nods - 1 x daily - 5 x weekly - 2 sets - 10 reps Romberg Stance with Head Rotation - 1 x daily - 5 x weekly - 2 sets - 10 reps Romberg Stance with Eyes Closed - 1 x daily - 5 x weekly - 1 sets - 3 reps - 15-20 seconds hold Heel Toe Raises with Counter Support - 1 x daily  - 5 x weekly - 2 sets - 10 reps Standing March with Counter Support - 1 x daily - 5 x weekly - 2 sets - 10 reps Side Stepping with Counter Support - 1 x daily - 5 x weekly - 2 sets - 10 reps Side to Side Weight Shift with Overhead Reach and Counter Support - 1 x daily - 5 x weekly - 2 sets - 10 reps        PT Education - 12/06/20 1351    Education Details Updated HEP    Person(s) Educated Patient;Spouse    Methods Explanation;Demonstration;Handout    Comprehension Verbalized understanding;Returned demonstration            PT Short Term Goals - 11/17/20 1413      PT SHORT TERM GOAL #1   Title Pt will perform 5x sit <> stand in 20 seconds or less with UE support  in order to demo improved functional mobility. ALL STGS DUE: 11/18/20    Baseline 23.93 seconds; 20.91 seconds on 11/17/20    Time 4    Period Weeks    Status Not Met    Target Date 11/18/20      PT SHORT TERM GOAL #2   Title Pt will improve BERG to at least a 42/56 to decr fall risk.    Baseline 41/56 on 11/17/20    Time 3    Period Weeks    Status Not Met      PT SHORT TERM GOAL #3   Title Pt/spouse will be educating on Fall Prevention to promote improved safety within the home    Baseline Dependent    Time 3    Period Weeks    Status Revised      PT SHORT TERM GOAL #4   Title Pt will perform TUG in 20 seconds or less with RW in order to demo decr fall risk.    Baseline 23.69 secs; 24.28 with rollator, 23 seconds with RW on 11/17/20    Time 3    Period Weeks    Status Not Met             PT Long Term Goals - 12/06/20 1419      PT LONG TERM GOAL #1   Title Pt will be independent with final HEP in order to build upon functional gains made in therapy. ALL LTGS DUE: 12/09/20    Baseline reports independence with HEP; completing daily    Time 6    Period Weeks    Status Achieved      PT LONG TERM GOAL #2  Title Pt will improve BERG to at least a 45/56 to demo improved balance and reduced fall risk     Baseline 26/56; 38/56    Time 6    Period Weeks    Status Revised      PT LONG TERM GOAL #3   Title Pt will ambulate at least 1000' with LRAD with supervision over unlevel surfaces in order to demo improved community mobility.    Baseline 600' ft with RW outdoors, supervision; 800 ft outdoors unlevel surfaces with rollator at supervision    Time 6    Period Weeks    Status Partially Met      PT LONG TERM GOAL #4   Title Pt will perform TUG in 15 seconds or less with RW vs. LRAD in order to demo decr fall risk.    Baseline 47.06 with RW, 25.25 seconds on 09/27/20; 23.69 with RW 10/25/20    Time 8    Period Weeks    Status Revised      PT LONG TERM GOAL #5   Title Pt will improve gait speed with RW to at least 2.0 ft/sec in order to demo decr fall risk.    Baseline 1.41 ft/sec with RW; 1.94 ft/sec with RW    Time 8    Period Weeks    Status On-going      PT LONG TERM GOAL #6   Title Pt will perform 5x sit <> stand in 15 seconds or less with UE support in order to demo improved functional mobility.    Baseline 40.13 seconds; 24.18 secs with UE support    Time 6    Period Weeks    Status Revised      PT LONG TERM GOAL #7   Title Pt will improve FOTO score to a 64 in order to demo improved function.    Baseline 55    Time 6    Period Weeks    Status On-going                 Plan - 12/06/20 1408    Clinical Impression Statement Today's skilled PT session included review and progressing HEP to patient's tolerance, PT providing verbal cues for proper technique with some activiites but tolerating progression well. completed ambulation outdoors with rollator, with patient able to ambulate x 800 ft. Planned d/c at next session.    Personal Factors and Comorbidities Comorbidity 3+;Past/Current Experience    Comorbidities CAF, CAD s/p CABG 2012, CVA 2015, A fib- on Xarelto, R ankle fx    Examination-Activity Limitations Stairs;Stand;Transfers;Squat;Locomotion Level;Carry     Examination-Participation Restrictions Community Activity;Cleaning    Stability/Clinical Decision Making Evolving/Moderate complexity    Rehab Potential Good    PT Frequency 2x / week    PT Duration 8 weeks    PT Treatment/Interventions ADLs/Self Care Home Management;Gait training;DME Instruction;Functional mobility training;Therapeutic activities;Neuromuscular re-education;Balance training;Stair training;Therapeutic exercise;Patient/family education;Passive range of motion;Vestibular;Manual techniques    PT Next Visit Plan how was exercise? finish checking LTGs + D/C    PT Home Exercise Plan Access Code: [email or web address]    Consulted and Agree with Plan of Care Patient;Family member/caregiver    Family Member Consulted wife susan           Patient will benefit from skilled therapeutic intervention in order to improve the following deficits and impairments:  Abnormal gait,Decreased balance,Decreased activity tolerance,Decreased coordination,Decreased knowledge of use of DME,Decreased range of motion,Difficulty walking,Decreased strength,Hypomobility,Postural dysfunction  Visit Diagnosis: Other abnormalities of gait and  mobility  Unsteadiness on feet  Muscle weakness (generalized)     Problem List Patient Active Problem List   Diagnosis Date Noted  . Diabetes ([place]) 08/30/2020  . Ankle fracture, right 08/30/2020  . Knee pain 08/30/2020  . Stroke (cerebrum) ([place]) 08/11/2020  . Acute CVA (cerebrovascular accident) ([place]) 08/06/2020  . TIA (transient ischemic attack) 08/05/2020  . Gout 12/07/2013  . Atrial fibrillation ([place]) 12/07/2013  . Acute thrombotic stroke ([place]) 09/23/2013  . CVA (cerebral infarction) 09/23/2013  . Hyperlipidemia with target LDL less than 70 04/16/2013  . CAD (coronary artery disease)   . Anxiety   . Hypertension   . S/P CABG x 4     Jones Bales, PT, DPT 12/06/2020, 2:27 PM  [facility] [address]  [place], [place], [address] Phone: [phone]   Fax:  [phone]  Name: Shane Espinoza MRN: [id number] Date of Birth: 06/07/1941

## 2020-12-07 ENCOUNTER — Encounter: Payer: Self-pay | Admitting: Physical Medicine & Rehabilitation

## 2020-12-07 ENCOUNTER — Encounter: Payer: Medicare HMO | Attending: Physical Medicine & Rehabilitation | Admitting: Physical Medicine & Rehabilitation

## 2020-12-07 VITALS — BP 155/79 | HR 81 | Temp 98.1°F | Ht 68.5 in | Wt 211.6 lb

## 2020-12-07 DIAGNOSIS — I69398 Other sequelae of cerebral infarction: Secondary | ICD-10-CM | POA: Diagnosis not present

## 2020-12-07 DIAGNOSIS — R269 Unspecified abnormalities of gait and mobility: Secondary | ICD-10-CM | POA: Diagnosis not present

## 2020-12-07 DIAGNOSIS — S8290XA Unspecified fracture of unspecified lower leg, initial encounter for closed fracture: Secondary | ICD-10-CM | POA: Diagnosis not present

## 2020-12-07 NOTE — Patient Instructions (Signed)

## 2020-12-07 NOTE — Progress Notes (Signed)
Subjective:    Patient ID: Shane Espinoza, male    DOB: 1940/07/22, 79 y.o.   MRN: [id number] 79 y.o.malewith history of CF, CAD s/p CABG, CVA, A. fib-on Xarelto, recent nondisplaced left lateral malleolus fracture secondary to fall01/06/22who was admitted on 08/05/2020 with left-sided weakness and numbness and recurrent fall. LLE symptoms resolved but he continued to have some weakness in left hand. MRI of brain showed focus of restricted diffusion with acute/subacute infarct and periventricular white matter. Dr. Leonie Man felt that stroke was due to small vessel disease and patient was educated on importance of taking medications at the same time with the largest meal of the day. Patient continued to have limitations due to LE weakness with instability, lateral lean as well as delay in processing with STM deficits  Admit date:08/11/2020 Discharge date:08/26/2020 LAST VISIT Patient with several questions regarding left leg swelling diuretic treatment skin issues.  The patient has been to dermatology for open weeping areas left lower extremity and has received topical medication for this.  The patient has received a prescription for Lasix from his cardiologist and is currently taking 40 mg/day.  Wife has noted a 3 pound weight loss.  We discussed that this can be used to monitor diuresis. Has f/u appt with Ortho next week.  No longer has left leg pain.  The patient had questions about calcium supplement which was started due to reduced bone healing while patient was in total. HPI Would like to return to driving  Patient states that prior to his stroke he was driving on a very limited basis.  His wife does  do most of the driving.  They do not go out at night.  He has an appointment with his cardiologist next week as well as primary care this week. No falls.  He remains incontinent of bladder but is continent of bowel. He has completed his outpatient physical therapy and is using his silver  sneakers benefit to join 2 different [employer] and continue a community-based exercise program as he did prior to the pandemic. He no longer sees orthopedics for the right fibula fracture.  He wears an ankle brace only with longer distance ambulation for comfort.  He was told by Ortho that his fracture has healed Pain Inventory Average Pain 0 Pain Right Now 0 My pain is no pain  LOCATION OF PAIN  no pain  BOWEL Number of stools per week: 10 Incontinent Yes   BLADDER Normal Frequent urination Yes     Mobility use a walker how many minutes can you walk? 30 ability to climb steps?  yes do you drive?  no transfers alone  Function disabled: date disabled . retired I need assistance with the following:  dressing, meal prep, household duties and shopping  Neuro/Psych bladder control problems  Prior Studies Any changes since last visit?  no  Physicians involved in your care Any changes since last visit?  no   Saw cardiologist last week and sees PCP tomorrow  Family History  Problem Relation Age of Onset  . CVA Father   . Heart attack Maternal Grandmother   . Heart attack Maternal Grandfather   . Heart murmur Sister   . Hypertension Son    Social History   Socioeconomic History  . Marital status: Married    Spouse name: [name]  . Number of children: 2  . Years of education: college  . Highest education level: Not on file  Occupational History  . Occupation: retitred  Tobacco Use  .  Smoking status: Never Smoker  . Smokeless tobacco: Never Used  Vaping Use  . Vaping Use: Never used  Substance and Sexual Activity  . Alcohol use: No  . Drug use: No  . Sexual activity: Never  Other Topics Concern  . Not on file  Social History Narrative   Lives at home with wife   Right Handed   Drinks caffeine occassionally   Social Determinants of Health   Financial Resource Strain: Not on file  Food Insecurity: Not on file  Transportation Needs: Not on file  Physical  Activity: Not on file  Stress: Not on file  Social Connections: Not on file   Past Surgical History:  Procedure Laterality Date  . CARDIAC CATHETERIZATION  03/02/2011   Recommended CABG  . COLON SURGERY    . CORONARY ARTERY BYPASS GRAFT  03/07/2011   DR.VAN  TRIGHT  . EYE SURGERY    . LEXISCAN MYOVIEW  02/20/2012   EKG negative for ischemia, noraml study  . TRANSTHORACIC ECHOCARDIOGRAM  02/20/2012   EF 50-55%, mild-moderate concentric LVH, LA moderately dilated, moderate mitral regurg  . VASCULAR DOPPLER  03/03/2011   Nosignificant extracranial carotid artery stenosis   Past Medical History:  Diagnosis Date  . Anxiety   . Atrial fibrillation ([place])   . Biceps tendon tear    right  . CAD (coronary artery disease)    [name]  . Gout   . Hypertension   . 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   [name]   BP (!) 155/79   Pulse 81   Temp 98.1 F (36.7 C)   Ht 5' 8.5" (1.74 m)   Wt 211 lb 9.6 oz (96 kg)   SpO2 91%   BMI 31.71 kg/m   Opioid Risk Score:   Fall Risk Score:  `1  Depression screen PHQ 2/9  Depression screen [facility] 2/9 12/07/2020 09/17/2020  Decreased Interest 0 0  Down, Depressed, Hopeless 0 1  PHQ - 2 Score 0 1  Altered sleeping - 0  Tired, decreased energy - 1  Change in appetite - 0  Feeling bad or failure about yourself  - 0  Trouble concentrating - 0  Moving slowly or fidgety/restless - 0  Suicidal thoughts - 0  PHQ-9 Score - 2  Some recent data might be hidden    Review of Systems  Constitutional: Negative.   HENT: Negative.   Eyes: Negative.   Respiratory: Negative.   Cardiovascular: Positive for leg swelling.  Gastrointestinal: Negative.   Endocrine: Negative.   Genitourinary: Positive for frequency.  Musculoskeletal: Negative.   Skin: Positive for rash.       Leg wound  Allergic/Immunologic: Negative.   Neurological: Negative.   Hematological: Bruises/bleeds easily.       Xarelto  Psychiatric/Behavioral: Negative.   All other systems reviewed  and are negative.      Objective:   Physical Exam Vitals and nursing note reviewed.  Constitutional:      Appearance: He is obese.  HENT:     Head: Normocephalic and atraumatic.  Eyes:     Extraocular Movements: Extraocular movements intact.     Conjunctiva/sclera: Conjunctivae normal.     Pupils: Pupils are equal, round, and reactive to light.  Musculoskeletal:     Comments: Wearing right ankle brace No pain with knee range of motion bilaterally Good shoulder range of motion with internal extra rotation as well as abduction. Normal cervical spine range of motion given age  Skin:  General: Skin is warm and dry.  Neurological:     Mental Status: He is alert and oriented to person, place, and time.     Gait: Gait abnormal.     Comments: Ambulates with a rolling walker, uses right ankle brace for support  Motor strength is 4/5 in left deltoid bicep tricep grip 5/5 in the right grip 4/5 dorsiflexion plantar flexor right ankle not tested secondary to ankle brace.  Psychiatric:        Mood and Affect: Mood normal.        Behavior: Behavior normal.           Assessment & Plan:  1.  Periventricular white matter infarcts with history of atrial fibrillation.  Overall he has plateaued in his rehab progress and is modified independent level using rolling walker.  He would benefit from a Rollator rather than a standard rolling walker to increase his step length. I agree with pursuing community-based exercise program.  Graduated return to driving instructions were provided. It is recommended that the patient first drives with another licensed driver in an empty parking lot. If the patient does well with this, and they can drive on a quiet street with the licensed driver. If the patient does well with this they can drive on a busy street with a licensed driver. If the patient does well with this, the next time out they can go by himself. For the first month after resuming driving, I  recommend no nighttime or Interstate driving.   Physical medicine rehabilitation follow-up on as-needed basis

## 2020-12-08 DIAGNOSIS — I4891 Unspecified atrial fibrillation: Secondary | ICD-10-CM | POA: Diagnosis not present

## 2020-12-08 DIAGNOSIS — S82841D Displaced bimalleolar fracture of right lower leg, subsequent encounter for closed fracture with routine healing: Secondary | ICD-10-CM | POA: Diagnosis not present

## 2020-12-08 DIAGNOSIS — E782 Mixed hyperlipidemia: Secondary | ICD-10-CM | POA: Diagnosis not present

## 2020-12-08 DIAGNOSIS — D6869 Other thrombophilia: Secondary | ICD-10-CM | POA: Diagnosis not present

## 2020-12-08 DIAGNOSIS — R269 Unspecified abnormalities of gait and mobility: Secondary | ICD-10-CM | POA: Diagnosis not present

## 2020-12-08 DIAGNOSIS — I10 Essential (primary) hypertension: Secondary | ICD-10-CM | POA: Diagnosis not present

## 2020-12-08 DIAGNOSIS — I6350 Cerebral infarction due to unspecified occlusion or stenosis of unspecified cerebral artery: Secondary | ICD-10-CM | POA: Diagnosis not present

## 2020-12-08 DIAGNOSIS — E559 Vitamin D deficiency, unspecified: Secondary | ICD-10-CM | POA: Diagnosis not present

## 2020-12-08 DIAGNOSIS — R7309 Other abnormal glucose: Secondary | ICD-10-CM | POA: Diagnosis not present

## 2020-12-08 DIAGNOSIS — Z79899 Other long term (current) drug therapy: Secondary | ICD-10-CM | POA: Diagnosis not present

## 2020-12-08 DIAGNOSIS — I2510 Atherosclerotic heart disease of native coronary artery without angina pectoris: Secondary | ICD-10-CM | POA: Diagnosis not present

## 2020-12-09 ENCOUNTER — Ambulatory Visit: Payer: Medicare HMO

## 2020-12-09 ENCOUNTER — Other Ambulatory Visit: Payer: Self-pay

## 2020-12-09 DIAGNOSIS — R2689 Other abnormalities of gait and mobility: Secondary | ICD-10-CM | POA: Diagnosis not present

## 2020-12-09 DIAGNOSIS — M6281 Muscle weakness (generalized): Secondary | ICD-10-CM | POA: Diagnosis not present

## 2020-12-09 DIAGNOSIS — R2681 Unsteadiness on feet: Secondary | ICD-10-CM | POA: Diagnosis not present

## 2020-12-09 NOTE — Therapy (Signed)
[facility] [address] [place], [place], [address] Phone: [phone]   Fax:  [phone]  Physical Therapy Treatment/Discharge Summary  Patient Details  Name: Shane Espinoza MRN: [id number] Date of Birth: 26-Jan-1941 Referring Provider (PT): Kirsteins, Luanna Salk, MD (followed by)  PHYSICAL THERAPY DISCHARGE SUMMARY  Visits from Start of Care: 26  Current functional level related to goals / functional outcomes: See clinical impression statement for details   Remaining deficits: Fall Risk, Decreased Strength   Education / Equipment: HEP  Plan: Patient agrees to discharge.  Patient goals were partially met. Patient is being discharged due to meeting the stated rehab goals.         Encounter Date: 12/09/2020   PT End of Session - 12/09/20 1418     Visit Number 26    Number of Visits 27    Date for PT Re-Evaluation 12/27/20   updated POC for 6 weeks, Cert for 60 days   Authorization Type [employer]    Progress Note Due on Visit 50   completed on 19th visit   PT Start Time 1415    PT Stop Time 1500    PT Time Calculation (min) 45 min    Equipment Utilized During Treatment Gait belt    Activity Tolerance Patient tolerated treatment well    Behavior During Therapy WFL for tasks assessed/performed             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   Subjective Assessment - 12/09/20 1419     Subjective Patient reports no new changes/complaints. Visit with PCP and Dr. Joan Espinoza went well. They are going to have a Sleep Test. No falls    Patient is accompained by: Family member   spouse Shane Espinoza   Pertinent History CAF, CAD s/p CABG 2012, CVA 2015, A fib- on Xarelto, Severe tricompartmental Right knee OA    Limitations Standing;Walking    How long can you walk comfortably? has been walking 20' at home with RW    Patient Stated Goals wants to be able to walk by himself.    Currently in Pain? No/denies                               [facility] Adult PT Treatment/Exercise - 12/09/20 0001       Transfers   Transfers Sit to Stand;Stand to Sit    Sit to Stand 5: Supervision    Five time sit to stand comments  18.97 secs w/ UE support from mat at chair height  Stand to Sit 5: Supervision      Ambulation/Gait   Ambulation/Gait Yes    Ambulation/Gait Assistance 6: Modified independent (Device/Increase time)    Ambulation/Gait Assistance Details Ambulation in therapy gym x 200 ft with Rollator. Mod I level indoors    Ambulation Distance (Feet) 200 Feet    Assistive device Rollator    Gait Pattern Decreased step length - left;Decreased stance time - right;Decreased weight shift to right;Step-through pattern;Step-to pattern    Ambulation Surface Level;Indoor    Gait velocity 14.44 secs = 2.27 ft/sec   with Rollator     Standardized Balance Assessment   Standardized Balance Assessment Timed Up and Go Test;Berg Balance Test      Berg Balance Test   Sit to Stand Able to stand  independently using hands    Standing Unsupported Able to stand safely 2 minutes    Sitting with Back Unsupported but Feet Supported on Floor or Stool Able to sit safely and  securely 2 minutes    Stand to Sit Sits safely with minimal use of hands    Transfers Able to transfer safely, definite need of hands    Standing Unsupported with Eyes Closed Able to stand 10 seconds safely    Standing Ubsupported with Feet Together Able to place feet together independently and stand 1 minute safely    From Standing, Reach Forward with Outstretched Arm Can reach confidently >25 cm (10")    From Standing Position, Pick up Object from Floor Able to pick up shoe, needs supervision    From Standing Position, Turn to Look Behind Over each Shoulder Looks behind from both sides and weight shifts well    Turn 360 Degrees Able to turn 360 degrees safely but slowly    Standing Unsupported, Alternately Place Feet on Step/Stool Able to complete >2 steps/needs minimal assist    Standing Unsupported, One Foot in Front Able to plae foot ahead of the other independently and hold 30 seconds    Standing on One Leg Tries to lift leg/unable to hold 3 seconds but remains standing independently    Total Score 44      Timed Up and Go Test   TUG Normal TUG    Normal TUG (seconds) 17.47   secs w/ Rollator     Exercises   Exercises Other Exercises    Other Exercises  Completed Verbal Review of HEP, addressing patient questions/concerns prior to d/c.                PT Education - 12/09/20 1456     Education Details Reviewed HEP; Progress toward LTG    Person(s) Educated Patient;Spouse    Methods Explanation    Comprehension Verbalized understanding              PT Short Term Goals - 11/17/20 1413       PT SHORT TERM GOAL #1   Title Pt will perform 5x sit <> stand in 20 seconds or less with UE support  in order to demo improved functional mobility. ALL STGS DUE: 11/18/20    Baseline 23.93 seconds; 20.91 seconds on 11/17/20    Time 4    Period Weeks    Status Not Met    Target Date 11/18/20      PT SHORT TERM GOAL #2   Title Pt will improve BERG to at least a 42/56 to decr  fall risk.    Baseline 41/56 on 11/17/20    Time 3  Period Weeks    Status Not Met      PT SHORT TERM GOAL #3   Title Pt/spouse will be educating on Fall Prevention to promote improved safety within the home    Baseline Dependent    Time 3    Period Weeks    Status Revised      PT SHORT TERM GOAL #4   Title Pt will perform TUG in 20 seconds or less with RW in order to demo decr fall risk.    Baseline 23.69 secs; 24.28 with rollator, 23 seconds with RW on 11/17/20    Time 3    Period Weeks    Status Not Met               PT Long Term Goals - 12/09/20 1429       PT LONG TERM GOAL #1   Title Pt will be independent with final HEP in order to build upon functional gains made in therapy. ALL LTGS DUE: 12/09/20    Baseline reports independence with HEP; completing daily. plans to return to [facility]    Time 6    Period Weeks    Status Achieved      PT LONG TERM GOAL #2   Title Pt will improve BERG to at least a 45/56 to demo improved balance and reduced fall risk    Baseline 26/56; 38/56; 44/56    Time 6    Period Weeks    Status Partially Met      PT LONG TERM GOAL #3   Title Pt will ambulate at least 1000' with LRAD with supervision over unlevel surfaces in order to demo improved community mobility.    Baseline 600' ft with RW outdoors, supervision; 800 ft outdoors unlevel surfaces with rollator at supervision    Time 6    Period Weeks    Status Partially Met      PT LONG TERM GOAL #4   Title Pt will perform TUG in 15 seconds or less with RW vs. LRAD in order to demo decr fall risk.    Baseline 47.06 with RW, 25.25 seconds on 09/27/20; 23.69 with RW 10/25/20; 17.47 secs    Time 8    Period Weeks    Status Not Met      PT LONG TERM GOAL #5   Title Pt will improve gait speed with RW to at least 2.0 ft/sec in order to demo decr fall risk.    Baseline 1.41 ft/sec with RW; 1.94 ft/sec with RW; 2.27 ft/sec    Time 8    Period Weeks    Status Achieved      PT LONG TERM  GOAL #6   Title Pt will perform 5x sit <> stand in 15 seconds or less with UE support in order to demo improved functional mobility.    Baseline 40.13 seconds; 24.18 secs with UE support; 18.97 secs with UE support    Time 6    Period Weeks    Status Not Met      PT LONG TERM GOAL #7   Title Pt will improve FOTO score to a 64 in order to demo improved function.    Baseline 55; 58    Time 6    Period Weeks    Status Not Met                   Plan - 12/09/20 1602     Clinical Impression Statement Today's skilled PT  session included assesment of patient's progress toward LTG. Patient able to meet/partially meet LTG #1, 2, 3, and 5. Patient able to make progress toward LTG #4, 6, and 7 just not to goal level. Patient has demonstrating significant progress with PT services. Patient demonstrating improved balance with 44/56 on Berg Balance Test. Patient is currently ambulating at 2.27 ft/sec with Rollator indicating limited community ambulator. Patient demonstrating readiness for d/c from PT services at this time. Verbal review of HEP. Addressed any questions/concerns. Patient and spouse verbalize agreement to d/c at this time.    Personal Factors and Comorbidities Comorbidity 3+;Past/Current Experience    Comorbidities CAF, CAD s/p CABG 2012, CVA 2015, A fib- on Xarelto, R ankle fx    Examination-Activity Limitations Stairs;Stand;Transfers;Squat;Locomotion Level;Carry    Examination-Participation Restrictions Community Activity;Cleaning    Stability/Clinical Decision Making Evolving/Moderate complexity    Rehab Potential Good    PT Frequency 2x / week    PT Duration 8 weeks    PT Treatment/Interventions ADLs/Self Care Home Management;Gait training;DME Instruction;Functional mobility training;Therapeutic activities;Neuromuscular re-education;Balance training;Stair training;Therapeutic exercise;Patient/family education;Passive range of motion;Vestibular;Manual techniques    PT Next  Visit Plan --    PT Home Exercise Plan Access Code: [email or web address]    Consulted and Agree with Plan of Care Patient;Family member/caregiver    Family Member Consulted wife susan             Patient will benefit from skilled therapeutic intervention in order to improve the following deficits and impairments:  Abnormal gait, Decreased balance, Decreased activity tolerance, Decreased coordination, Decreased knowledge of use of DME, Decreased range of motion, Difficulty walking, Decreased strength, Hypomobility, Postural dysfunction  Visit Diagnosis: Other abnormalities of gait and mobility  Unsteadiness on feet  Muscle weakness (generalized)     Problem List Patient Active Problem List   Diagnosis Date Noted   Diabetes ([place]) 08/30/2020   Ankle fracture, right 08/30/2020   Knee pain 08/30/2020   Stroke (cerebrum) ([place]) 08/11/2020   Acute CVA (cerebrovascular accident) ([place]) 08/06/2020   TIA (transient ischemic attack) 08/05/2020   Gout 12/07/2013   Atrial fibrillation ([place]) 12/07/2013   Acute thrombotic stroke ([place]) 09/23/2013   CVA (cerebral infarction) 09/23/2013   Hyperlipidemia with target LDL less than 70 04/16/2013   CAD (coronary artery disease)    Anxiety    Hypertension    S/P CABG x [place], PT, DPT 12/09/2020, 4:21 PM  [facility] [address] [place], [place], [address] Phone: [phone]   Fax:  [phone]  Name: Shane Espinoza MRN: [id number] Date of Birth: June 28, 1941

## 2020-12-26 ENCOUNTER — Other Ambulatory Visit: Payer: Self-pay | Admitting: Cardiovascular Disease

## 2020-12-28 DIAGNOSIS — G4719 Other hypersomnia: Secondary | ICD-10-CM | POA: Diagnosis not present

## 2021-01-05 DIAGNOSIS — R0609 Other forms of dyspnea: Secondary | ICD-10-CM | POA: Diagnosis not present

## 2021-01-05 DIAGNOSIS — G4733 Obstructive sleep apnea (adult) (pediatric): Secondary | ICD-10-CM | POA: Diagnosis not present

## 2021-01-05 DIAGNOSIS — R0601 Orthopnea: Secondary | ICD-10-CM | POA: Diagnosis not present

## 2021-01-05 DIAGNOSIS — R609 Edema, unspecified: Secondary | ICD-10-CM | POA: Diagnosis not present

## 2021-01-10 ENCOUNTER — Ambulatory Visit
Admission: RE | Admit: 2021-01-10 | Discharge: 2021-01-10 | Disposition: A | Discharge status: Home / Self Care | Payer: Medicare HMO | Source: Ambulatory Visit | Attending: Internal Medicine | Admitting: Internal Medicine

## 2021-01-10 ENCOUNTER — Other Ambulatory Visit: Payer: Self-pay | Admitting: Internal Medicine

## 2021-01-10 DIAGNOSIS — R0601 Orthopnea: Secondary | ICD-10-CM | POA: Diagnosis not present

## 2021-01-10 DIAGNOSIS — G4730 Sleep apnea, unspecified: Secondary | ICD-10-CM

## 2021-01-10 DIAGNOSIS — I5043 Acute on chronic combined systolic (congestive) and diastolic (congestive) heart failure: Secondary | ICD-10-CM | POA: Diagnosis not present

## 2021-01-10 DIAGNOSIS — R609 Edema, unspecified: Secondary | ICD-10-CM | POA: Diagnosis not present

## 2021-01-10 DIAGNOSIS — R0600 Dyspnea, unspecified: Secondary | ICD-10-CM

## 2021-01-10 DIAGNOSIS — J90 Pleural effusion, not elsewhere classified: Secondary | ICD-10-CM | POA: Diagnosis not present

## 2021-01-10 DIAGNOSIS — R0609 Other forms of dyspnea: Secondary | ICD-10-CM | POA: Diagnosis not present

## 2021-01-17 DIAGNOSIS — G4730 Sleep apnea, unspecified: Secondary | ICD-10-CM | POA: Diagnosis not present

## 2021-01-17 DIAGNOSIS — R609 Edema, unspecified: Secondary | ICD-10-CM | POA: Diagnosis not present

## 2021-01-17 DIAGNOSIS — I5043 Acute on chronic combined systolic (congestive) and diastolic (congestive) heart failure: Secondary | ICD-10-CM | POA: Diagnosis not present

## 2021-01-17 DIAGNOSIS — R0601 Orthopnea: Secondary | ICD-10-CM | POA: Diagnosis not present

## 2021-01-17 DIAGNOSIS — R0609 Other forms of dyspnea: Secondary | ICD-10-CM | POA: Diagnosis not present

## 2021-01-27 DIAGNOSIS — H52203 Unspecified astigmatism, bilateral: Secondary | ICD-10-CM | POA: Diagnosis not present

## 2021-01-27 DIAGNOSIS — H35372 Puckering of macula, left eye: Secondary | ICD-10-CM | POA: Diagnosis not present

## 2021-01-27 DIAGNOSIS — H524 Presbyopia: Secondary | ICD-10-CM | POA: Diagnosis not present

## 2021-01-27 DIAGNOSIS — H2512 Age-related nuclear cataract, left eye: Secondary | ICD-10-CM | POA: Diagnosis not present

## 2021-02-22 DIAGNOSIS — I2583 Coronary atherosclerosis due to lipid rich plaque: Secondary | ICD-10-CM | POA: Diagnosis not present

## 2021-02-22 DIAGNOSIS — I2510 Atherosclerotic heart disease of native coronary artery without angina pectoris: Secondary | ICD-10-CM | POA: Diagnosis not present

## 2021-02-22 DIAGNOSIS — I10 Essential (primary) hypertension: Secondary | ICD-10-CM | POA: Diagnosis not present

## 2021-02-23 LAB — COMPREHENSIVE METABOLIC PANEL
ALT: 30 IU/L (ref 0–44)
AST: 34 IU/L (ref 0–40)
Albumin/Globulin Ratio: 1.5 (ref 1.2–2.2)
Albumin: 4.1 g/dL (ref 3.6–4.6)
Alkaline Phosphatase: 175 IU/L — ABNORMAL HIGH (ref 44–121)
BUN/Creatinine Ratio: 21 (ref 10–24)
BUN: 25 mg/dL (ref 8–27)
Bilirubin Total: 1 mg/dL (ref 0.0–1.2)
CO2: 26 mmol/L (ref 20–29)
Calcium: 9.8 mg/dL (ref 8.6–10.2)
Chloride: 100 mmol/L (ref 96–106)
Creatinine, Ser: 1.21 mg/dL (ref 0.76–1.27)
Globulin, Total: 2.8 g/dL (ref 1.5–4.5)
Glucose: 115 mg/dL — ABNORMAL HIGH (ref 65–99)
Potassium: 4.3 mmol/L (ref 3.5–5.2)
Sodium: 141 mmol/L (ref 134–144)
Total Protein: 6.9 g/dL (ref 6.0–8.5)
eGFR: 60 mL/min/{1.73_m2} (ref >59)

## 2021-02-23 LAB — CBC
Hematocrit: 49.4 % (ref 37.5–51.0)
Hemoglobin: 16.2 g/dL (ref 13.0–17.7)
MCH: 28.9 pg (ref 26.6–33.0)
MCHC: 32.8 g/dL (ref 31.5–35.7)
MCV: 88 fL (ref 79–97)
Platelets: 183 10*3/uL (ref 150–450)
RBC: 5.6 x10E6/uL (ref 4.14–5.80)
RDW: 14.4 % (ref 11.6–15.4)
WBC: 8.2 10*3/uL (ref 3.4–10.8)

## 2021-02-23 LAB — TSH: TSH: 2.56 u[IU]/mL (ref 0.450–4.500)

## 2021-02-23 LAB — LIPID PANEL
Chol/HDL Ratio: 3 ratio (ref 0.0–5.0)
Cholesterol, Total: 130 mg/dL (ref 100–199)
HDL: 43 mg/dL (ref >39)
LDL Chol Calc (NIH): 65 mg/dL (ref 0–99)
Triglycerides: 121 mg/dL (ref 0–149)
VLDL Cholesterol Cal: 22 mg/dL (ref 5–40)

## 2021-02-25 ENCOUNTER — Encounter: Payer: Self-pay | Admitting: Cardiovascular Disease

## 2021-02-25 ENCOUNTER — Ambulatory Visit: Payer: Medicare HMO | Admitting: Cardiovascular Disease

## 2021-02-25 ENCOUNTER — Other Ambulatory Visit: Payer: Self-pay

## 2021-02-25 VITALS — BP 106/68 | HR 68 | Ht 68.5 in | Wt 198.0 lb

## 2021-02-25 DIAGNOSIS — I2583 Coronary atherosclerosis due to lipid rich plaque: Secondary | ICD-10-CM | POA: Diagnosis not present

## 2021-02-25 DIAGNOSIS — I2510 Atherosclerotic heart disease of native coronary artery without angina pectoris: Secondary | ICD-10-CM

## 2021-02-25 DIAGNOSIS — I4891 Unspecified atrial fibrillation: Secondary | ICD-10-CM | POA: Diagnosis not present

## 2021-02-25 DIAGNOSIS — Z951 Presence of aortocoronary bypass graft: Secondary | ICD-10-CM

## 2021-02-25 NOTE — Progress Notes (Signed)
Patient ID: Shane Espinoza, male   DOB: Dec 01, 1940, 80 y.o.   MRN: [id number]      HPI: Shane Espinoza is a 80 y.o. male who presents to the office for a 3 month cardiology evaluation.    Shane Espinoza has established CAD and underwent CABG revascularization surgery by Shane Espinoza on 03/07/2011 after a nuclear perfusion study revealed significant scar/ischemia and cardiac catheterization demonstrated severe multivessel CAD. He had aLIMA to the LAD, vein to the OM, vein to the RV marginal, and vein to the PDA. He  developed PAF postoperatively and underwent TEE guided cardioversion with restoration of sinus rhythm. He completed cardiac rehabilitation. A nuclear perfusion study in August 2013 was markedly improved did not reveal any region of scar or ischemia. He has been off amiodarone and maintaining sinus rhythm. Additional problems include hypertension, hyperlipidemia, and intermittent lower extremity edema.  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  An MRI of his brain showed a tiny left operculum, hemorrhage, infarct, is concerned that potentially this was thrombotic.  He did not have any loss of function of the upper or lower extremities and denied any headache, dizziness, visual symptoms, or difficulty swallowing.  His ECG at that time did suggest sinus rhythm.  He now is on anticoagulation with Xarelto 20 mg. His head CT was negative as was his MRA of the large and medium size vessels.  His carotids were widely patent.  He denies chest pain.  He denies any significant neurologic sequelae.  He is  in permanent atrial fibrillation on chronic anticoagulation with Xarelto.  He has had episodes of recurrent gout.  I had taken him off HCTZ since this could be contributing to elevation of uric acid.  He is now on low-dose chronic alliupurinol and since instituting therapy has not had any recurrent gout episodes.   When I saw him in October 2018 he denied  any episodes of chest pain, palpitations, presyncope or syncope.  He underwent a nuclear stress test on 02/20/2017 which showed an EF of 47% with mild diffuse hypokinesis.  Is no evidence for scar or ischemia.  Because of the slightly reduced EF.  He underwent a echo Doppler study on 03/14/2017.  This showed an estimated EF at 55% without regional wall motion abnormalities.  His left atrium was severely dilated.  Contributed by his atrial fibrillation.  He has had issues with some intermittent leg swelling and was on amlodipine at 2.5 mg, which was reduced because of edema, furosemide 20 mg 5 days per week, irbesartan 300 mg and metoprolol, tartrate 25 mg in the morning and 12.5 mmol at night.  He is on simvastatin for hyperlipidemia and Xarelto for anticoagulation.    I saw him on June 04, 2018 at which time he remained relatively stable from a cardiac standpoint.  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  He does use a recumbent bike at the gym.  He underwent laboratory by Shane Espinoza September 2019 which showed a glucose of 134.  Renal function was normal.  Lipid studies are excellent with a total cholesterol 111 HDL 37 LDL 53 and triglycerides 105.  Creatinine was 0.99.  Hemoglobin was 15.  He has been wearing support stockings 15 to 20 mm which has been helpful for his leg swelling.  He states his blood pressure typically runs in the 140s to 150s.  His pulse typically runs in the 50s  to 64s and occasionally in the 70s.    When I  saw him in December 2020 he was not able to go to the gym due to the COVID-19 pandemic.  His main exercise was walking his dog.  He admits to some weight gain.  He denied anginal symptoms.  He has had follow-up lab work with his primary physician Shane Espinoza and hemoglobin A1c was 6.2.  He wears support stockings most of the time.  He denies bleeding on anticoagulation.  His atrial fibrillation rate  has been well  controlled.  At that evaluation, his blood pressure was stable on amlodipine at a reduced dose of 2.5 mg, metoprolol succinate 25 mg, valsartan 320 mg and spironolactone 12.5 mg.  I saw him in October 2021.  Over the prior year he had done fairly well from a cardiovascular standpoint.    However, he had recently had diarrhea and was diagnosed with C. difficile and E. coli for which he took antibiotics.  He also is uncircumcised and his have been difficulty with scarring.  He has seen Shane Espinoza of [facility] and was told of having phimosis.  He is in need to undergo a modified circumcision and has been on anticoagulation therapy.   Since I saw him, he had fallen and fractured his right ankle in January 2022.  He was hospitalized in February 2022 from February 9 through February 24 with left-sided weakness and numbness and recurrent fall.  He continued to have some weakness in his left hand.  An MRI of his brain showed a focus of restricted diffusion with acute/subacute infarct and periventricular white matter.  He was evaluated by Shane Espinoza who felt a stroke was due to small vessel disease.  An echo Doppler study on August 05, 2020 showed an EF of 55 to 60% with grade 1 diastolic dysfunction, mild biatrial enlargement, and aortic valve sclerosis.  He was maintained on Xarelto anticoagulation.  Metformin was added for blood sugar control and he also underwent rehabilitation.  Subsequently, he developed a left leg wound culture positive for Pseudomonas for which she was treated with Cipro and additional antibiotic.  I last saw him on Nov 15, 2020.  At that time he denied any chest pain but he was having some ankle swelling.  He was undergoing neuro rehabilitation.  He continued to be on atorvastatin 40 mg for hyperlipidemia.  He has been on furosemide 20 or 40 mg depending upon edema, metoprolol succinate 25 mg daily, metformin and continues to be on Xarelto with his atrial fibrillation  anticoagulation.  During that evaluation, his resting pulse was in the 80s and he had mild blood pressure elevation.  With his leg swelling I recommended he increase furosemide to 40 mg every morning and depending upon residual edema he may need to take an extra 20 mg in the afternoon as needed.  I recommended support stockings and follow-up laboratory.  Since I saw him, he states he developed some shortness of breath and wheezing on July 6 which was treated with furosemide.  He is followed by Shane Espinoza.  He has also seen Shane Espinoza and a sleep study demonstrated mild sleep apnea.  He is awaiting a CPAP machine.  Presently he is on atorvastatin 40 mg, furosemide 60 mg in the morning, metoprolol succinate 25 mg, spironolactone 25 mg, valsartan 80 mg, and he continues to take Xarelto for anticoagulation.  He presents for follow-up evaluation..  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   Shane Espinoza    Past Surgical History:  Procedure Laterality Date   CARDIAC CATHETERIZATION  03/02/2011   Recommended CABG   COLON SURGERY     CORONARY ARTERY BYPASS GRAFT  03/07/2011   ShaneVAN  Espinoza   EYE SURGERY     LEXISCAN MYOVIEW  02/20/2012   EKG negative for ischemia, noraml study   TRANSTHORACIC ECHOCARDIOGRAM  02/20/2012   EF 50-55%, mild-moderate concentric LVH, LA moderately dilated, moderate mitral regurg   VASCULAR DOPPLER  03/03/2011   Nosignificant extracranial carotid artery stenosis    Allergies  Allergen Reactions   Lisinopril     Other reaction(s): cough    Current Outpatient Medications  Medication Sig Dispense Refill   allopurinol (ZYLOPRIM) 100 MG tablet Take 1 tablet by mouth daily.  2   atorvastatin (LIPITOR) 40 MG tablet Take 1 tablet (40 mg total) by mouth at bedtime. 40 tablet 0   Cholecalciferol (VITAMIN D-3)  5000 UNIT/ML LIQD 1 capsule     furosemide (LASIX) 20 MG tablet Take 11m (2 tablets) in the morning, if swelling persists, take another 251m(1 tablet) in afternoon. (Patient taking differently: 20 mg. Take 4071m2 tablets) in the morning, if swelling persists, take another 71m57m tablet) in afternoon.) 180 tablet 3   metoprolol succinate (TOPROL-XL) 25 MG 24 hr tablet TAKE 1 TABLET DAILY 90 tablet 1   spironolactone (ALDACTONE) 25 MG tablet Take 25 mg by mouth daily.     valsartan (DIOVAN) 80 MG tablet Take 80 mg by mouth daily.     XARELTO 20 MG TABS tablet TAKE 1 TABLET DAILY WITH   SUPPER 90 tablet 1   No current facility-administered medications for this visit.    Social History   Socioeconomic History   Marital status: Married    Spouse name: SusaManuela Schwartzumber of children: 2   Years of education: college   Highest education level: Not on file  Occupational History   Occupation: retitred  Tobacco Use   Smoking status: Never   Smokeless tobacco: Never  Vaping Use   Vaping Use: Never used  Substance and Sexual Activity   Alcohol use: No   Drug use: No   Sexual activity: Never  Other Topics Concern   Not on file  Social History Narrative   Lives at home with wife   Right Handed   Drinks caffeine occassionally   Social Determinants of Health   Financial Resource Strain: Not on file  Food Insecurity: Not on file  Transportation Needs: Not on file  Physical Activity: Not on file  Stress: Not on file  Social Connections: Not on file  Intimate Partner Violence: Not on file    Family History  Problem Relation Age of Onset   CVA Father    Heart attack Maternal Grandmother    Heart attack Maternal Grandfather    Heart murmur Sister    Hypertension Son    Socially he is married has 2 children 4 grandchildren. He now exercises fairly regularly. There is no tobacco or alcohol use.   ROS General: Negative; No fevers, chills, or night sweats;  HEENT: Negative; No  changes in vision or hearing, sinus congestion, difficulty swallowing Pulmonary: Negative; No cough, wheezing, shortness of breath, hemoptysis Cardiovascular:  see history of present illness Positive for intermittent edema GI: Positive for hemorrhoids and he status post hemorrhoidal banding; recent C. difficile infection GU: Recently diagnosed phimosis in need for modified circumcision Musculoskeletal: Positive for gout; right ankle fracture with oblique fracture of the distal fibula Hematologic/Oncology: Negative; no easy bruising, bleeding Endocrine: Negative; no heat/cold intolerance; no diabetes Neuro: Positive for thrombotic stroke; recent probable stroke due to small vessel disease;no changes in balance, headaches Skin: Negative; No rashes or skin lesions Psychiatric: Negative; No behavioral problems, depression Sleep: Negative; No snoring, daytime sleepiness, hypersomnolence, bruxism, restless legs, hypnogognic hallucinations, no cataplexy Other comprehensive 14 point system review is negative.   PE BP 106/68 (BP Location: Left Arm)   Pulse 68   Ht 5' 8.5" (1.74 m)   Wt 198 lb (89.8 kg)   SpO2 97%   BMI 29.67 kg/m    Repeat blood pressure by me was 148/80  Wt Readings from Last 3 Encounters:  02/25/21 198 lb (89.8 kg)  12/07/20 211 lb 9.6 oz (96 kg)  11/15/20 212 lb (96.2 kg)   General: Alert, oriented, no distress.  Skin: normal turgor, no rashes, warm and dry HEENT: Normocephalic, atraumatic. Pupils equal round and reactive to light; sclera anicteric; extraocular muscles intact; Nose without nasal septal hypertrophy Mouth/Parynx benign; Mallinpatti scale Neck: No JVD, no carotid bruits; normal carotid upstroke Lungs: clear to ausculatation and percussion; no wheezing or rales Chest wall: without tenderness to palpitation Heart: PMI not displaced, irregular irregular rhythm consistent with atrial fibrillation with controlled rate, s1 s2 normal, 1/6 systolic murmur, no  diastolic murmur, no rubs, gallops, thrills, or heaves Abdomen: soft, nontender; no hepatosplenomehaly, BS+; abdominal aorta nontender and not dilated by palpation. Back: no CVA tenderness Pulses 2+ Musculoskeletal: full range of motion, normal strength, no joint deformities Extremities: Trivial left ankle edema no clubbing cyanosis, Homan's sign negative  Neurologic: grossly nonfocal; Cranial nerves grossly wnl Psychologic: Normal mood and affect    ECG (independently read by me):  Atrial fibrillation at 68  Nov 15, 2020 ECG (independently read by me): Atrial fibrillation at 83  October 2021 ECG (independently read by me): Atrial fibrillation at 75, PVC  December 2020 ECG (independently read by me): Atrial fibrillation at 81 bpm.  Poor anterior R wave progression.  QTc interval 446 ms  December 2019 ECG (independently read by me): Atrial fibrillation at 76 bpm QTc interval 443 ms.  No ECG done today, but review of ECG at the time of his nuclear study on 02/20/2017 showed atrial fibrillation at 78 bpm  November 2017 ECG (independently read by me): Atrial fibrillation at 70 bpm.  Poor anterior R-wave progression.  QTc interval 475 ms.  November 2016 ECG (independently read by me): Atrial fibrillation at 67 bpm.  Nonspecific ST changes.  QTc interval normal at 443 ms.  May 2016 ECG (independently read by me): Atrial fibrillation with a controlled ventricular rate in the 60s.  December 2015 ECG (independently read by me): Atrial fibrillation with a ventricular rate at 84.  No significant ST-T changes  June 2015 ECG (independently read by me): Atrial fibrillation with a ventricular rate is 74.  Nonspecific ST changes.  10/09/2013 ECG (independently read by me): Atrial fibrillation with controlled ventricular response at 63 beats per minute.  Prior April 16 2013 ECG: Sinus bradycardia at 52 beats per minute.  LABS: BMP Latest Ref Rng & Units 02/22/2021 08/23/2020 08/16/2020  Glucose  65 - 99 mg/dL 115(H) 113(H) 111(H)  BUN 8 - 27  mg/dL 25 17 21   Creatinine 0.76 - 1.27 mg/dL 1.21 1.21 1.15  BUN/Creat Ratio 10 - 24 21 - -  Sodium 134 - 144 mmol/L 141 140 137  Potassium 3.5 - 5.2 mmol/L 4.3 4.2 3.8  Chloride 96 - 106 mmol/L 100 104 104  CO2 20 - 29 mmol/L 26 28 23   Calcium 8.6 - 10.2 mg/dL 9.8 9.5 8.9   Hepatic Function Latest Ref Rng & Units 02/22/2021 08/12/2020 08/05/2020  Total Protein 6.0 - 8.5 g/dL 6.9 6.1(L) 7.1  Albumin 3.6 - 4.6 g/dL 4.1 2.9(L) 3.3(L)  AST 0 - 40 IU/L 34 25 29  ALT 0 - 44 IU/L 30 20 22   Alk Phosphatase 44 - 121 IU/L 175(H) 64 74  Total Bilirubin 0.0 - 1.2 mg/dL 1.0 1.1 1.3(H)   CBC Latest Ref Rng & Units 02/22/2021 08/23/2020 08/16/2020  WBC 3.4 - 10.8 x10E3/uL 8.2 7.1 8.3  Hemoglobin 13.0 - 17.7 g/dL 16.2 14.9 14.3  Hematocrit 37.5 - 51.0 % 49.4 46.0 43.6  Platelets 150 - 450 x10E3/uL 183 202 179   Lab Results  Component Value Date   MCV 88 02/22/2021   MCV 93.1 08/23/2020   MCV 91.6 08/16/2020   Lab Results  Component Value Date   TSH 2.560 02/22/2021   Lab Results  Component Value Date   HGBA1C 6.5 (H) 08/06/2020   Lipid Panel     Component Value Date/Time   CHOL 130 02/22/2021 1028   TRIG 121 02/22/2021 1028   HDL 43 02/22/2021 1028   CHOLHDL 3.0 02/22/2021 1028   CHOLHDL 2.8 08/06/2020 0604   VLDL 20 08/06/2020 0604   LDLCALC 65 02/22/2021 1028    IMPRESSION:  No diagnosis found.  ASSESSMENT AND PLAN: Mr. Calabrese is an 80 year-old white male who underwent CABG revascularization surgery for severe multivessel CAD in September 2012.  He suffered a small thrombotic CVA most likely due to atrial fibrillation etiology in March 2015 and  had complete resolution of symptoms.  He has permanent atrial fibrillation with a controlled ventricular response for which he is entirely asymptomatic.  He continues to be on Xarelto anticoagulation therapy. His last nuclear stress test in August 2018 continued to show normal perfusion  without scar or ischemia.  There was some discordance to the EF on the nuclear study when compared to the echo.  The echo revealed an EF of 55% without wall motion abnormality, severe LA dilatation, which undoubtedly is contributed by his atrial fibrillation.  On July 07, 2020 he had fallen and sustained a oblique fracture of the distal fibula.  In August 05, 2020 he was hospitalized with left-sided weakness and numbness after recurrent fall.  He had resolution of left lower extremity symptoms but he continued to have weakness in his left hand.  An MRI of the brain showed probable acute subacute infarct in the periventricular white matter and it was felt by Shane Espinoza that his stroke was due to small vessel disease.  He has continued to be on Xarelto for his permanent atrial fibrillation and chronic anticoagulation.  When last evaluated by me he was having ankle swelling.  At that time, his Lasix dose was increased to 40 mg in the morning and he was told to potentially take an extra 20 mg as needed.  Most recently, he has been taking 60 mg in the morning with improvement and on exam there is only residual trivial left ankle edema.  His blood pressure is stable on his multiple medical  regimen consisting of his furosemide 60 mg, metoprolol succinate 25 mg, spironolactone 25 mg and valsartan 80 mg.  ECG continues to demonstrate his permanent atrial fibrillation and he is tolerating anticoagulation with Xarelto without bleeding.  He apparently was recently diagnosed with mild obstructive sleep apnea with an AHI of 6.0 per Shane Espinoza and is awaiting CPAP machine.  I reviewed most recent laboratory from February 22, 2021.  Lipid studies were excellent with an LDL of 65 and he continues to be on atorvastatin 40 mg.  TSH was normal.  His last echo in February 2022 showed normal systolic function at 55 to 60% with grade 1 diastolic dysfunction, mild mitral regurgitation and mild to moderate aortic sclerosis.  I will see him  in 4 months for follow-up evaluation or sooner as needed.    Troy Sine, MD, [facility]  02/25/2021 11:09 AM

## 2021-02-25 NOTE — Patient Instructions (Signed)
Medication Instructions:  No changes  *If you need a refill on your cardiac medications before your next appointment, please call your pharmacy*   Lab Work: Not needed    Testing/Procedures:  Will be schedule at [place] has requested that you have an echocardiogram. Echocardiography is a painless test that uses sound waves to create images of your heart. It provides your doctor with information about the size and shape of your heart and how well your heart's chambers and valves are working. This procedure takes approximately one hour. There are no restrictions for this procedure.   Follow-Up: At [facility], you and your health needs are our priority.  As part of our continuing mission to provide you with exceptional heart care, we have created designated Provider Care Teams.  These Care Teams include your primary Cardiologist (physician) and Advanced Practice Providers (APPs -  Physician Assistants and Nurse Practitioners) who all work together to provide you with the care you need, when you need it.     Your next appointment:   4 month(s)  The format for your next appointment:   In Person  Provider:   Shelva Majestic, MD

## 2021-03-01 DIAGNOSIS — R0609 Other forms of dyspnea: Secondary | ICD-10-CM | POA: Diagnosis not present

## 2021-03-01 DIAGNOSIS — E559 Vitamin D deficiency, unspecified: Secondary | ICD-10-CM | POA: Diagnosis not present

## 2021-03-01 DIAGNOSIS — R609 Edema, unspecified: Secondary | ICD-10-CM | POA: Diagnosis not present

## 2021-03-01 DIAGNOSIS — R748 Abnormal levels of other serum enzymes: Secondary | ICD-10-CM | POA: Diagnosis not present

## 2021-03-01 DIAGNOSIS — E782 Mixed hyperlipidemia: Secondary | ICD-10-CM | POA: Diagnosis not present

## 2021-03-01 DIAGNOSIS — R0601 Orthopnea: Secondary | ICD-10-CM | POA: Diagnosis not present

## 2021-03-01 DIAGNOSIS — G4730 Sleep apnea, unspecified: Secondary | ICD-10-CM | POA: Diagnosis not present

## 2021-03-01 DIAGNOSIS — I5043 Acute on chronic combined systolic (congestive) and diastolic (congestive) heart failure: Secondary | ICD-10-CM | POA: Diagnosis not present

## 2021-03-02 DIAGNOSIS — G4733 Obstructive sleep apnea (adult) (pediatric): Secondary | ICD-10-CM | POA: Diagnosis not present

## 2021-03-04 ENCOUNTER — Encounter: Payer: Self-pay | Admitting: Cardiovascular Disease

## 2021-03-18 ENCOUNTER — Other Ambulatory Visit (HOSPITAL_COMMUNITY): Payer: Medicare HMO

## 2021-03-25 ENCOUNTER — Other Ambulatory Visit: Payer: Self-pay

## 2021-03-25 ENCOUNTER — Ambulatory Visit (HOSPITAL_COMMUNITY): Payer: Medicare HMO | Attending: Cardiology

## 2021-03-25 DIAGNOSIS — Z951 Presence of aortocoronary bypass graft: Secondary | ICD-10-CM | POA: Diagnosis not present

## 2021-03-25 DIAGNOSIS — I4891 Unspecified atrial fibrillation: Secondary | ICD-10-CM | POA: Insufficient documentation

## 2021-03-25 DIAGNOSIS — I2510 Atherosclerotic heart disease of native coronary artery without angina pectoris: Secondary | ICD-10-CM | POA: Insufficient documentation

## 2021-03-25 DIAGNOSIS — I2583 Coronary atherosclerosis due to lipid rich plaque: Secondary | ICD-10-CM | POA: Insufficient documentation

## 2021-03-25 LAB — ECHOCARDIOGRAM COMPLETE
MV M vel: 5.47 m/s
MV Peak grad: 119.7 mmHg
Radius: 0.6 cm
S' Lateral: 3.8 cm

## 2021-04-01 DIAGNOSIS — G4733 Obstructive sleep apnea (adult) (pediatric): Secondary | ICD-10-CM | POA: Diagnosis not present

## 2021-04-18 DIAGNOSIS — G4733 Obstructive sleep apnea (adult) (pediatric): Secondary | ICD-10-CM | POA: Diagnosis not present

## 2021-04-23 DIAGNOSIS — G4733 Obstructive sleep apnea (adult) (pediatric): Secondary | ICD-10-CM | POA: Diagnosis not present

## 2021-04-23 DIAGNOSIS — I110 Hypertensive heart disease with heart failure: Secondary | ICD-10-CM | POA: Diagnosis not present

## 2021-04-23 DIAGNOSIS — D6869 Other thrombophilia: Secondary | ICD-10-CM | POA: Diagnosis not present

## 2021-04-23 DIAGNOSIS — M109 Gout, unspecified: Secondary | ICD-10-CM | POA: Diagnosis not present

## 2021-04-23 DIAGNOSIS — I4891 Unspecified atrial fibrillation: Secondary | ICD-10-CM | POA: Diagnosis not present

## 2021-04-23 DIAGNOSIS — E785 Hyperlipidemia, unspecified: Secondary | ICD-10-CM | POA: Diagnosis not present

## 2021-04-23 DIAGNOSIS — I509 Heart failure, unspecified: Secondary | ICD-10-CM | POA: Diagnosis not present

## 2021-04-23 DIAGNOSIS — E1159 Type 2 diabetes mellitus with other circulatory complications: Secondary | ICD-10-CM | POA: Diagnosis not present

## 2021-04-23 DIAGNOSIS — E261 Secondary hyperaldosteronism: Secondary | ICD-10-CM | POA: Diagnosis not present

## 2021-04-23 DIAGNOSIS — I2510 Atherosclerotic heart disease of native coronary artery without angina pectoris: Secondary | ICD-10-CM | POA: Diagnosis not present

## 2021-04-23 DIAGNOSIS — I69354 Hemiplegia and hemiparesis following cerebral infarction affecting left non-dominant side: Secondary | ICD-10-CM | POA: Diagnosis not present

## 2021-04-23 DIAGNOSIS — I252 Old myocardial infarction: Secondary | ICD-10-CM | POA: Diagnosis not present

## 2021-05-02 DIAGNOSIS — G4733 Obstructive sleep apnea (adult) (pediatric): Secondary | ICD-10-CM | POA: Diagnosis not present

## 2021-05-16 ENCOUNTER — Encounter: Payer: Self-pay | Admitting: Adult Health

## 2021-05-16 ENCOUNTER — Ambulatory Visit: Payer: Medicare HMO | Admitting: Adult Health

## 2021-05-16 ENCOUNTER — Telehealth: Payer: Self-pay | Admitting: Adult Health

## 2021-05-16 ENCOUNTER — Other Ambulatory Visit: Payer: Self-pay

## 2021-05-16 VITALS — BP 109/65 | HR 48 | Ht 68.0 in | Wt 198.0 lb

## 2021-05-16 DIAGNOSIS — I639 Cerebral infarction, unspecified: Secondary | ICD-10-CM

## 2021-05-16 MED ORDER — CARBIDOPA-LEVODOPA ER 25-100 MG PO TBCR
1.0000 | EXTENDED_RELEASE_TABLET | Freq: Every day | ORAL | 5 refills | Status: DC
Start: 2021-05-16 — End: 2021-05-16

## 2021-05-16 NOTE — Telephone Encounter (Signed)
Pt is asking for a call re: what medication was called in for him to the pharmacy and why

## 2021-05-16 NOTE — Patient Instructions (Addendum)
Your Plan:  Continue current plan - continue xarelto and atorvastatin for secondary stroke prevention  Continue to follow with PCP for diabetic management, blood pressure and cholesterol  Continue to follow with cardiology for atrial fibrillation and Xarelto management   Continue to follow with Dr. Elenore Rota for sleep apnea and CPAP use         Thank you for coming to see [place] at [facility]. I hope we have been able to provide you high quality care today.  You may receive a patient satisfaction survey over the next few weeks. We would appreciate your feedback and comments so that we may continue to improve ourselves and the health of our patients.

## 2021-05-16 NOTE — Progress Notes (Signed)
[facility] [address] [place]. [place] [address] [phone]       OFFICE FOLLOW-UP NOTE  [name] [facility] Date of Birth:  Mar 13, 1941 Medical Record Number:  [id number]    Chief Complaint  Patient presents with   Follow-up    RM  2 with spouse susan Pt is well and stable, no new stroke concerns.       HPI:   Update 05/16/2021 Shane Espinoza: Returns for 90-month stroke follow-up.  Overall stable -denies new stroke/TIA symptoms Completed PT back in June - continues to do HEP daily. Does have mild left left instability but otherwise recovered well. Use of RW at all times due to b/l knee issues and prior R ankle fracture - will occasionally ambulate at home without RW but will hold on to furniture for support - no recent falls.   Compliant on Xarelto and atorvastatin 40 mg daily -denies side effects Blood pressure today 109/65 Dx'd with severe sleep apnea (AHI 63) by Shane Espinoza - has since started on CPAP over the past 2 months - has f/u tomorrow with Shane Espinoza  Prior A1c 6.7 - stopped metformin due to side effects. Monitors glucose levels at home which have been stable  No new stroke/TIA symptoms    History provided for reference purposes only Initial evaluation 11/04/2020 Shane Espinoza: Shane Espinoza is a 80 year old pleasant Caucasian male seen today for initial office follow-up visit following hospital admission for stroke in February 2022.  He is accompanied by his wife.  History is obtained from them and review of electronic medical records and I have personally reviewed pertinent imaging films in PACS.  He has past medical history of hypertension, coronary artery disease s/p CABG and remote stroke in 2015 and chronic atrial fibrillation on long-term anticoagulation with Xarelto.  He presented on 08/05/2020 with sudden onset of left-sided weakness.  He was walking with his walker and noticed his left arm was weak and shaking and leg was also dragging.  He sat down  and felt lightheaded.  Symptoms did not resolve so he called EMS.  His symptoms resolved by the time he reached the hospital.  NIH stroke scale was 0.  He was not given tPA.  Noncontrast CAT scan of the head showed mild atrophy and changes of small vessel disease.  MRI scan of the brain showed tiny restricted diffusion area in the periventricular region of the right lateral ventricle.  MRA showed probable carotid bifurcation stenosis but was limited by calcification.  Carotid ultrasound showed velocities consistent with 1-39% bilateral carotid stenosis with calcific plaque.  2D echo showed ejection fraction 55 to 60% with dilatation of left atrium.  LDL was 45 mg percent and hemoglobin A1c was 6.5.  Patient had been on Xarelto but was not taking it consistently at the same time and was counseled to take it at the same time but decent meal.  Patient was transferred to inpatient rehab did well and was subsequently discharged home and is currently finishing outpatient therapy.  He feels his almost back to normal and neck slightly diminished fine motor skills ambulates with balance is not the same.  His main complaint is swelling in his legs.  His cardiologist Shane Espinoza is increase his Lasix but he has another upcoming appointment in a few weeks.  Is tolerating Xarelto well without bleeding but does have easy bruising.  He has no new complaints.    ROS:   14 system review of systems is positive for those  listed in HPI and all other systems negative  PMH:  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   [name]    Social History:  Social History   Socioeconomic History   Marital status: Married    Spouse name: Shane Espinoza   Number of children: 2   Years of education: college   Highest education level: Not on file  Occupational History    Occupation: retitred  Tobacco Use   Smoking status: Never   Smokeless tobacco: Never  Vaping Use   Vaping Use: Never used  Substance and Sexual Activity   Alcohol use: No   Drug use: No   Sexual activity: Never  Other Topics Concern   Not on file  Social History Narrative   Lives at home with wife   Right Handed   Drinks caffeine occassionally   Social Determinants of Health   Financial Resource Strain: Not on file  Food Insecurity: Not on file  Transportation Needs: Not on file  Physical Activity: Not on file  Stress: Not on file  Social Connections: Not on file  Intimate Partner Violence: Not on file    Medications:   Current Outpatient Medications on File Prior to Visit  Medication Sig Dispense Refill   allopurinol (ZYLOPRIM) 100 MG tablet Take 1 tablet by mouth daily.  2   atorvastatin (LIPITOR) 40 MG tablet Take 1 tablet (40 mg total) by mouth at bedtime. 40 tablet 0   Cholecalciferol (VITAMIN D-3) 5000 UNIT/ML LIQD 1 capsule     furosemide (LASIX) 20 MG tablet Take 40mg  (2 tablets) in the morning, if swelling persists, take another 20mg  (1 tablet) in afternoon. (Patient taking differently: 20 mg. Take 40mg  (2 tablets) in the morning, if swelling persists, take another 20mg  (1 tablet) in afternoon.) 180 tablet 3   metoprolol succinate (TOPROL-XL) 25 MG 24 hr tablet TAKE 1 TABLET DAILY 90 tablet 1   spironolactone (ALDACTONE) 25 MG tablet Take 25 mg by mouth daily.     valsartan (DIOVAN) 80 MG tablet Take 80 mg by mouth daily.     vitamin B-12 (CYANOCOBALAMIN) 1000 MCG tablet Take 1,000 mcg by mouth daily.     Vitamin D, Ergocalciferol, (DRISDOL) 1.25 MG (50000 UNIT) CAPS capsule Take 50,000 Units by mouth 3 (three) times a week.     XARELTO 20 MG TABS tablet TAKE 1 TABLET DAILY WITH   SUPPER 90 tablet 1   No current facility-administered medications on file prior to visit.    Allergies:   Allergies  Allergen Reactions   Lisinopril     Other reaction(s): cough     Physical Exam Today's Vitals   05/16/21 0957  BP: 109/65  Pulse: (!) 48  Weight: 198 lb (89.8 kg)  Height: 5\' 8"  (1.727 m)   Body mass index is 30.11 kg/m.   General: well developed, well nourished pleasant elderly Caucasian male, seated, in no evident distress Head: head normocephalic and atraumatic.  Neck: supple with no carotid or supraclavicular bruits Cardiovascular: irregular rate and rhythm, no murmurs Musculoskeletal: no deformity Skin:  no rash/petichiae multiple petechiae bilaterally Vascular:  Normal pulses all extremities  Neurologic Exam Mental Status: Awake and fully alert. Oriented to place and time. Recent and remote memory intact.  Attention span, concentration and fund of knowledge appropriate. Mood and affect appropriate.  Cranial Nerves: Pupils equal, briskly reactive to light. Extraocular movements full without nystagmus. Visual fields full to confrontation. Hearing intact. Facial sensation intact.  Face, tongue, palate moves normally and symmetrically.  Motor: Normal bulk and tone. Normal strength in all tested extremity muscles.   Sensory.: intact to touch ,pinprick .position and vibratory sensation.  Coordination: Rapid alternating movements normal in all extremities. Finger-to-nose and heel-to-shin performed accurately bilaterally. Gait and Station: Arises from chair with slight difficulty. Stance is stooped uses a walker.. Gait demonstrates normal stride length and balance  with use of RW Reflexes: 1+ and symmetric. Toes downgoing.       ASSESSMENT: 80 year old Caucasian male with right periventricular white matter tiny lacunar infarct in February 2022 due to small vessel disease though he has chronic atrial fibrillation and is on long-term anticoagulation with Xarelto.  Remote history of stroke in 2015.  Vascular risk factors of atrial fibrillation, hypertension, hyperlipidemia, diabetes , dx of severe sleep apnea now on CPAP, old stroke in 2015,and  congestive heart failure     PLAN: -Continue xarelto 20mg  daily and atorvastatin 40mg  daily for secondary stroke prevention for atrial fibrillation  -continue to do HEP at home - continued use of RW at all times -Continue follow-up with cardiology for atrial fibrillation and Xarelto management -Continue follow-up with Shane Espinoza for sleep apnea and CPAP follow-up -close f/u with PCP to maintain strict control of hypertension with blood pressure goal below 130/90, diabetes with hemoglobin A1c goal below 7.0% and lipids with LDL cholesterol goal below 70 mg/dL.    Follow up as needed as stable from stroke standpoint     CC:  Josetta Huddle, MD   I spent 33 minutes of face-to-face and non-face-to-face time with patient and wife.  This included previsit chart review, lab review, study review, order entry, electronic health record documentation, patient and wife education and discussion regarding history of prior stroke, secondary stroke prevention measures and aggressive stroke risk factor management, and answered all other questions to patient and wife's satisfaction  Frann Rider, AGNP-BC  [facility] [address] [place], [place] [address]  Phone [phone] Fax [phone] Note: This document was prepared with digital dictation and possible smart phrase technology. Any transcriptional errors that result from this process are unintentional.

## 2021-05-16 NOTE — Telephone Encounter (Signed)
[place] Wife contacted me back, she called the pharmacy and they stated the prescription was written by Frann Rider NP prescribing Leva-Dopa. She also has it recorded what the medication was, Wife told pharmacy to cancel that Rx

## 2021-05-16 NOTE — Telephone Encounter (Signed)
Contacted pt, informed him and wife we did not prescribe him any medications, advised to try to contact PCP.  Wife stated [facility] said the medication was, she think or it sounds like, " Senovest " Wife will give them a call back and see what it is about

## 2021-05-16 NOTE — Telephone Encounter (Signed)
Medication was entered in error - d/c'd immediately after placing.

## 2021-05-17 DIAGNOSIS — I872 Venous insufficiency (chronic) (peripheral): Secondary | ICD-10-CM | POA: Diagnosis not present

## 2021-05-17 DIAGNOSIS — D485 Neoplasm of uncertain behavior of skin: Secondary | ICD-10-CM | POA: Diagnosis not present

## 2021-05-17 DIAGNOSIS — C44212 Basal cell carcinoma of skin of right ear and external auricular canal: Secondary | ICD-10-CM | POA: Diagnosis not present

## 2021-05-17 DIAGNOSIS — L853 Xerosis cutis: Secondary | ICD-10-CM | POA: Diagnosis not present

## 2021-05-17 DIAGNOSIS — D1801 Hemangioma of skin and subcutaneous tissue: Secondary | ICD-10-CM | POA: Diagnosis not present

## 2021-05-17 DIAGNOSIS — D692 Other nonthrombocytopenic purpura: Secondary | ICD-10-CM | POA: Diagnosis not present

## 2021-05-17 DIAGNOSIS — I8311 Varicose veins of right lower extremity with inflammation: Secondary | ICD-10-CM | POA: Diagnosis not present

## 2021-05-17 DIAGNOSIS — I8312 Varicose veins of left lower extremity with inflammation: Secondary | ICD-10-CM | POA: Diagnosis not present

## 2021-05-17 DIAGNOSIS — L821 Other seborrheic keratosis: Secondary | ICD-10-CM | POA: Diagnosis not present

## 2021-05-17 DIAGNOSIS — Z85828 Personal history of other malignant neoplasm of skin: Secondary | ICD-10-CM | POA: Diagnosis not present

## 2021-05-17 DIAGNOSIS — L570 Actinic keratosis: Secondary | ICD-10-CM | POA: Diagnosis not present

## 2021-05-19 DIAGNOSIS — G4733 Obstructive sleep apnea (adult) (pediatric): Secondary | ICD-10-CM | POA: Diagnosis not present

## 2021-05-23 DIAGNOSIS — G4733 Obstructive sleep apnea (adult) (pediatric): Secondary | ICD-10-CM | POA: Diagnosis not present

## 2021-05-24 DIAGNOSIS — R748 Abnormal levels of other serum enzymes: Secondary | ICD-10-CM | POA: Diagnosis not present

## 2021-05-24 DIAGNOSIS — E559 Vitamin D deficiency, unspecified: Secondary | ICD-10-CM | POA: Diagnosis not present

## 2021-05-24 DIAGNOSIS — I5043 Acute on chronic combined systolic (congestive) and diastolic (congestive) heart failure: Secondary | ICD-10-CM | POA: Diagnosis not present

## 2021-05-24 DIAGNOSIS — E538 Deficiency of other specified B group vitamins: Secondary | ICD-10-CM | POA: Diagnosis not present

## 2021-05-24 DIAGNOSIS — I4891 Unspecified atrial fibrillation: Secondary | ICD-10-CM | POA: Diagnosis not present

## 2021-05-24 DIAGNOSIS — D6869 Other thrombophilia: Secondary | ICD-10-CM | POA: Diagnosis not present

## 2021-05-24 DIAGNOSIS — R0609 Other forms of dyspnea: Secondary | ICD-10-CM | POA: Diagnosis not present

## 2021-05-24 DIAGNOSIS — R609 Edema, unspecified: Secondary | ICD-10-CM | POA: Diagnosis not present

## 2021-05-24 DIAGNOSIS — M109 Gout, unspecified: Secondary | ICD-10-CM | POA: Diagnosis not present

## 2021-05-24 DIAGNOSIS — Z8673 Personal history of transient ischemic attack (TIA), and cerebral infarction without residual deficits: Secondary | ICD-10-CM | POA: Diagnosis not present

## 2021-05-24 DIAGNOSIS — G4730 Sleep apnea, unspecified: Secondary | ICD-10-CM | POA: Diagnosis not present

## 2021-05-24 DIAGNOSIS — Z0000 Encounter for general adult medical examination without abnormal findings: Secondary | ICD-10-CM | POA: Diagnosis not present

## 2021-05-24 DIAGNOSIS — Z1389 Encounter for screening for other disorder: Secondary | ICD-10-CM | POA: Diagnosis not present

## 2021-05-24 DIAGNOSIS — I10 Essential (primary) hypertension: Secondary | ICD-10-CM | POA: Diagnosis not present

## 2021-05-24 DIAGNOSIS — N471 Phimosis: Secondary | ICD-10-CM | POA: Diagnosis not present

## 2021-05-24 DIAGNOSIS — R0601 Orthopnea: Secondary | ICD-10-CM | POA: Diagnosis not present

## 2021-05-24 DIAGNOSIS — R7309 Other abnormal glucose: Secondary | ICD-10-CM | POA: Diagnosis not present

## 2021-05-24 DIAGNOSIS — E782 Mixed hyperlipidemia: Secondary | ICD-10-CM | POA: Diagnosis not present

## 2021-05-31 DIAGNOSIS — G4733 Obstructive sleep apnea (adult) (pediatric): Secondary | ICD-10-CM | POA: Diagnosis not present

## 2021-06-01 DIAGNOSIS — G4733 Obstructive sleep apnea (adult) (pediatric): Secondary | ICD-10-CM | POA: Diagnosis not present

## 2021-06-02 ENCOUNTER — Telehealth: Payer: Self-pay

## 2021-06-02 NOTE — Telephone Encounter (Signed)
   [facility] Pre-operative Risk Assessment    Patient Name: Shane Espinoza  DOB: 11-Mar-1941 MRN: [id number]  HEARTCARE STAFF:  - IMPORTANT!!!!!! Under Visit Info/Reason for Call, type in Other and utilize the format Clearance MM/DD/YY or Clearance TBD. Do not use dashes or single digits. - Please review there is not already an duplicate clearance open for this procedure. - If request is for dental extraction, please clarify the # of teeth to be extracted. - If the patient is currently at the dentist's office, call Pre-Op Callback Staff (MA/nurse) to input urgent request.  - If the patient is not currently in the dentist office, please route to the Pre-Op pool.  Request for surgical clearance:  What type of surgery is being performed? Incisional biopsy of the lower left mandible   When is this surgery scheduled? TBD   What type of clearance is required (medical clearance vs. Pharmacy clearance to hold med vs. Both)? Both   Are there any medications that need to be held prior to surgery and how long? Xarelto, instructions on holding prior to and restarting   Practice name and name of physician performing surgery? [facility]  What is the office phone number? [phone]   7.   What is the office fax number? [phone]  8.   Anesthesia type (None, local, MAC, general) ? Not listed    Jacqulynn Cadet 06/02/2021, 4:39 PM  _________________________________________________________________   (provider comments below)

## 2021-06-02 NOTE — Progress Notes (Shared)
[facility] Note  06/06/2021     CHIEF COMPLAINT Patient presents for No chief complaint on file.   HISTORY OF PRESENT ILLNESS: OLANREWAJU Shane Espinoza is a 80 y.o. male who presents to the clinic today for:     Referring physician: Josetta Huddle, MD [address] [place],  [place] [address]  HISTORICAL INFORMATION:   Selected notes from the MEDICAL RECORD NUMBER Referred by Dr. Truman Hayward:  Ocular Hx- PMH-    CURRENT MEDICATIONS: No current outpatient medications on file. (Ophthalmic Drugs)   No current facility-administered medications for this visit. (Ophthalmic Drugs)   Current Outpatient Medications (Other)  Medication Sig   allopurinol (ZYLOPRIM) 100 MG tablet Take 1 tablet by mouth daily.   atorvastatin (LIPITOR) 40 MG tablet Take 1 tablet (40 mg total) by mouth at bedtime.   Cholecalciferol (VITAMIN D-3) 5000 UNIT/ML LIQD 1 capsule   furosemide (LASIX) 20 MG tablet Take 40mg  (2 tablets) in the morning, if swelling persists, take another 20mg  (1 tablet) in afternoon. (Patient taking differently: 20 mg. Take 40mg  (2 tablets) in the morning, if swelling persists, take another 20mg  (1 tablet) in afternoon.)   metoprolol succinate (TOPROL-XL) 25 MG 24 hr tablet TAKE 1 TABLET DAILY   spironolactone (ALDACTONE) 25 MG tablet Take 25 mg by mouth daily.   valsartan (DIOVAN) 80 MG tablet Take 80 mg by mouth daily.   vitamin B-12 (CYANOCOBALAMIN) 1000 MCG tablet Take 1,000 mcg by mouth daily.   Vitamin D, Ergocalciferol, (DRISDOL) 1.25 MG (50000 UNIT) CAPS capsule Take 50,000 Units by mouth 3 (three) times a week.   XARELTO 20 MG TABS tablet TAKE 1 TABLET DAILY WITH   SUPPER   No current facility-administered medications for this visit. (Other)      REVIEW OF SYSTEMS:    ALLERGIES Allergies  Allergen Reactions   Lisinopril     Other reaction(s): cough    PAST MEDICAL HISTORY Past Medical History:  Diagnosis Date   Anxiety     Atrial fibrillation (HCC)    Biceps tendon tear    right   CAD (coronary artery disease)    [name]   Gout    Hypertension    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   [name]   Past Surgical History:  Procedure Laterality Date   CARDIAC CATHETERIZATION  03/02/2011   Recommended CABG   COLON SURGERY     CORONARY ARTERY BYPASS GRAFT  03/07/2011   DR.VAN  TRIGHT   EYE SURGERY     LEXISCAN MYOVIEW  02/20/2012   EKG negative for ischemia, noraml study   TRANSTHORACIC ECHOCARDIOGRAM  02/20/2012   EF 50-55%, mild-moderate concentric LVH, LA moderately dilated, moderate mitral regurg   VASCULAR DOPPLER  03/03/2011   Nosignificant extracranial carotid artery stenosis    FAMILY HISTORY Family History  Problem Relation Age of Onset   CVA Father    Heart attack Maternal Grandmother    Heart attack Maternal Grandfather    Heart murmur Sister    Hypertension Son     SOCIAL HISTORY Social History   Tobacco Use   Smoking status: Never   Smokeless tobacco: Never  Vaping Use   Vaping Use: Never used  Substance Use Topics   Alcohol use: No   Drug use: No         OPHTHALMIC EXAM:  Not recorded     IMAGING AND PROCEDURES  Imaging and Procedures for 06/06/2021  ASSESSMENT/PLAN:    ICD-10-CM   1. Retinal edema  H35.81       1.  2.  3.  Ophthalmic Meds Ordered this visit:  No orders of the defined types were placed in this encounter.      No follow-ups on file.  There are no Patient Instructions on file for this visit.   Explained the diagnoses, plan, and follow up with the patient and they expressed understanding.  Patient expressed understanding of the importance of proper follow up care.   This document serves as a record of services personally performed by Gardiner Sleeper, MD, PhD. It was created on their behalf by Roselee Nova, COMT. The creation of this record is the provider's dictation and/or activities during the visit.  Electronically signed  by: Roselee Nova, COMT 06/02/21 10:37 AM    Gardiner Sleeper, M.D., Ph.D. Diseases & Surgery of the Retina and [place] @TODAY @     Abbreviations: M myopia (nearsighted); A astigmatism; H hyperopia (farsighted); P presbyopia; Mrx spectacle prescription;  CTL contact lenses; OD right eye; OS left eye; OU both eyes  XT exotropia; ET esotropia; PEK punctate epithelial keratitis; PEE punctate epithelial erosions; DES dry eye syndrome; MGD meibomian gland dysfunction; ATs artificial tears; PFAT's preservative free artificial tears; [place] nuclear sclerotic cataract; PSC posterior subcapsular cataract; ERM epi-retinal membrane; PVD posterior vitreous detachment; RD retinal detachment; DM diabetes mellitus; DR diabetic retinopathy; NPDR non-proliferative diabetic retinopathy; PDR proliferative diabetic retinopathy; CSME clinically significant macular edema; DME diabetic macular edema; dbh dot blot hemorrhages; CWS cotton wool spot; POAG primary open angle glaucoma; C/D cup-to-disc ratio; HVF humphrey visual field; GVF goldmann visual field; OCT optical coherence tomography; IOP intraocular pressure; BRVO Branch retinal vein occlusion; CRVO central retinal vein occlusion; CRAO central retinal artery occlusion; BRAO branch retinal artery occlusion; RT retinal tear; SB scleral buckle; PPV pars plana vitrectomy; VH Vitreous hemorrhage; PRP panretinal laser photocoagulation; IVK intravitreal kenalog; VMT vitreomacular traction; MH Macular hole;  NVD neovascularization of the disc; NVE neovascularization elsewhere; AREDS age related eye disease study; ARMD age related macular degeneration; POAG primary open angle glaucoma; EBMD epithelial/anterior basement membrane dystrophy; ACIOL anterior chamber intraocular lens; IOL intraocular lens; PCIOL posterior chamber intraocular lens; Phaco/IOL phacoemulsification with intraocular lens placement; [place] photorefractive keratectomy; LASIK laser  assisted in situ keratomileusis; HTN hypertension; DM diabetes mellitus; COPD chronic obstructive pulmonary disease

## 2021-06-02 NOTE — Telephone Encounter (Signed)
Pharmacy, can you please comment on how long Xarelto can be held for upcoming procedure?  Thank you! 

## 2021-06-03 NOTE — Telephone Encounter (Signed)
   Primary Cardiologist: Shelva Majestic, MD  Chart reviewed as part of pre-operative protocol coverage. Given past medical history and time since last visit, based on ACC/AHA guidelines, ASHAWN RINEHART would be at acceptable risk for the planned procedure without further cardiovascular testing.   Patient with diagnosis of afib on Xarelto for anticoagulation.     Procedure: Incisional biopsy of the lower left mandible Date of procedure: TBD   CHA2DS2-VASc Score = 7  This indicates a 11.2% annual risk of stroke. The patient's score is based upon: CHF History: 0 HTN History: 1 Diabetes History: 1 Stroke History: 2 Vascular Disease History: 1 Age Score: 2 Gender Score: 0  CVA 08/2020 and 08/2013   CrCl 2mL/min Platelet count 183K   Per office protocol, patient can hold Xarelto for 1 day prior to procedure. He should resume anticoag as soon as safely possible after.  Patient was advised that if he develops new symptoms prior to surgery to contact our office to arrange a follow-up appointment.  He verbalized understanding.  I will route this recommendation to the requesting party via Epic fax function and remove from pre-op pool.  Please call with questions.  Jossie Ng. Harlym Gehling NP-C    06/03/2021, 12:59 PM [place] [address] Office [phone] Fax [phone]

## 2021-06-03 NOTE — Telephone Encounter (Signed)
Patient with diagnosis of afib on Xarelto for anticoagulation.    Procedure: Incisional biopsy of the lower left mandible Date of procedure: TBD  CHA2DS2-VASc Score = 7  This indicates a 11.2% annual risk of stroke. The patient's score is based upon: CHF History: 0 HTN History: 1 Diabetes History: 1 Stroke History: 2 Vascular Disease History: 1 Age Score: 2 Gender Score: 0  CVA 08/2020 and 08/2013  CrCl 28mL/min Platelet count 183K  Per office protocol, patient can hold Xarelto for 1 day prior to procedure. He should resume anticoag as soon as safely possible after.

## 2021-06-06 ENCOUNTER — Encounter (INDEPENDENT_AMBULATORY_CARE_PROVIDER_SITE_OTHER): Payer: Medicare HMO | Admitting: Ophthalmology

## 2021-06-06 ENCOUNTER — Other Ambulatory Visit: Payer: Self-pay

## 2021-06-06 DIAGNOSIS — H3581 Retinal edema: Secondary | ICD-10-CM

## 2021-06-06 DIAGNOSIS — H35033 Hypertensive retinopathy, bilateral: Secondary | ICD-10-CM

## 2021-06-06 DIAGNOSIS — H35341 Macular cyst, hole, or pseudohole, right eye: Secondary | ICD-10-CM | POA: Diagnosis not present

## 2021-06-06 DIAGNOSIS — H35372 Puckering of macula, left eye: Secondary | ICD-10-CM

## 2021-06-06 DIAGNOSIS — I10 Essential (primary) hypertension: Secondary | ICD-10-CM

## 2021-06-06 DIAGNOSIS — H43812 Vitreous degeneration, left eye: Secondary | ICD-10-CM | POA: Diagnosis not present

## 2021-06-10 ENCOUNTER — Encounter (INDEPENDENT_AMBULATORY_CARE_PROVIDER_SITE_OTHER): Payer: Medicare HMO | Admitting: Ophthalmology

## 2021-06-15 ENCOUNTER — Other Ambulatory Visit: Payer: Self-pay

## 2021-06-15 ENCOUNTER — Ambulatory Visit: Payer: Medicare HMO | Admitting: Cardiovascular Disease

## 2021-06-15 ENCOUNTER — Encounter: Payer: Self-pay | Admitting: Cardiovascular Disease

## 2021-06-15 VITALS — BP 100/58 | HR 78 | Ht 69.0 in | Wt 199.4 lb

## 2021-06-15 DIAGNOSIS — I10 Essential (primary) hypertension: Secondary | ICD-10-CM | POA: Diagnosis not present

## 2021-06-15 MED ORDER — FUROSEMIDE 20 MG PO TABS: ORAL_TABLET | ORAL | 3 refills | Status: DC

## 2021-06-15 MED ORDER — SPIRONOLACTONE 25 MG PO TABS: 12.5000 mg | ORAL_TABLET | Freq: Every day | ORAL | 1 refills | Status: AC

## 2021-06-15 NOTE — Progress Notes (Signed)
Patient ID: SHIVAM MESTAS, male   DOB: 31-Jul-1940, 80 y.o.   MRN: [id number]      HPI: Shane Espinoza is a 80 y.o. male who presents to the office for a 4 month cardiology evaluation.    Mr. Stiff has established CAD and underwent CABG revascularization surgery by Dr. Nils Pyle on 03/07/2011 after a nuclear perfusion study revealed significant scar/ischemia and cardiac catheterization demonstrated severe multivessel CAD. He had aLIMA to the LAD, vein to the OM, vein to the RV marginal, and vein to the PDA. He  developed PAF postoperatively and underwent TEE guided cardioversion with restoration of sinus rhythm. He completed cardiac rehabilitation. A nuclear perfusion study in August 2013 was markedly improved did not reveal any region of scar or ischemia. He has been off amiodarone and maintaining sinus rhythm. Additional problems include hypertension, hyperlipidemia, and intermittent lower extremity edema.  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  An MRI of his brain showed a tiny left operculum, hemorrhage, infarct, is concerned that potentially this was thrombotic.  He did not have any loss of function of the upper or lower extremities and denied any headache, dizziness, visual symptoms, or difficulty swallowing.  His ECG at that time did suggest sinus rhythm.  He now is on anticoagulation with Xarelto 20 mg. His head CT was negative as was his MRA of the large and medium size vessels.  His carotids were widely patent.  He denies chest pain.  He denies any significant neurologic sequelae.  He is  in permanent atrial fibrillation on chronic anticoagulation with Xarelto.  He has had episodes of recurrent gout.  I had taken him off HCTZ since this could be contributing to elevation of uric acid.  He is now on low-dose chronic alliupurinol and since instituting therapy has not had any recurrent gout episodes.   When I saw him in October 2018 he denied  any episodes of chest pain, palpitations, presyncope or syncope.  He underwent a nuclear stress test on 02/20/2017 which showed an EF of 47% with mild diffuse hypokinesis.  Is no evidence for scar or ischemia.  Because of the slightly reduced EF.  He underwent a echo Doppler study on 03/14/2017.  This showed an estimated EF at 55% without regional wall motion abnormalities.  His left atrium was severely dilated.  Contributed by his atrial fibrillation.  He has had issues with some intermittent leg swelling and was on amlodipine at 2.5 mg, which was reduced because of edema, furosemide 20 mg 5 days per week, irbesartan 300 mg and metoprolol, tartrate 25 mg in the morning and 12.5 mmol at night.  He is on simvastatin for hyperlipidemia and Xarelto for anticoagulation.    I saw him on June 04, 2018 at which time he remained relatively stable from a cardiac standpoint.  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  He does use a recumbent bike at the gym.  He underwent laboratory by Dr. Inda Merlin September 2019 which showed a glucose of 134.  Renal function was normal.  Lipid studies are excellent with a total cholesterol 111 HDL 37 LDL 53 and triglycerides 105.  Creatinine was 0.99.  Hemoglobin was 15.  He has been wearing support stockings 15 to 20 mm which has been helpful for his leg swelling.  He states his blood pressure typically runs in the 140s to 150s.  His pulse typically runs in the 50s  to 53s and occasionally in the 70s.    When I  saw him in December 2020 he was not able to go to the gym due to the COVID-19 pandemic.  His main exercise was walking his dog.  He admits to some weight gain.  He denied anginal symptoms.  He has had follow-up lab work with his primary physician Dr. Inda Merlin and hemoglobin A1c was 6.2.  He wears support stockings most of the time.  He denies bleeding on anticoagulation.  His atrial fibrillation rate  has been well  controlled.  At that evaluation, his blood pressure was stable on amlodipine at a reduced dose of 2.5 mg, metoprolol succinate 25 mg, valsartan 320 mg and spironolactone 12.5 mg.  I saw him in October 2021.  Over the prior year he had done fairly well from a cardiovascular standpoint.    However, he had recently had diarrhea and was diagnosed with C. difficile and E. coli for which he took antibiotics.  He also is uncircumcised and his have been difficulty with scarring.  He has seen Dr. Diona Fanti of [facility] and was told of having phimosis.  He is in need to undergo a modified circumcision and has been on anticoagulation therapy.   Since I saw him, he had fallen and fractured his right ankle in January 2022.  He was hospitalized in February 2022 from February 9 through February 24 with left-sided weakness and numbness and recurrent fall.  He continued to have some weakness in his left hand.  An MRI of his brain showed a focus of restricted diffusion with acute/subacute infarct and periventricular white matter.  He was evaluated by Dr. Leonie Man who felt a stroke was due to small vessel disease.  An echo Doppler study on August 05, 2020 showed an EF of 55 to 60% with grade 1 diastolic dysfunction, mild biatrial enlargement, and aortic valve sclerosis.  He was maintained on Xarelto anticoagulation.  Metformin was added for blood sugar control and he also underwent rehabilitation.  Subsequently, he developed a left leg wound culture positive for Pseudomonas for which she was treated with Cipro and additional antibiotic.  When I saw him on Nov 15, 2020 he denied any chest pain but he was having some ankle swelling.  He was undergoing neuro rehabilitation.  He continued to be on atorvastatin 40 mg for hyperlipidemia.  He has been on furosemide 20 or 40 mg depending upon edema, metoprolol succinate 25 mg daily, metformin and continues to be on Xarelto with his atrial fibrillation anticoagulation.  During that  evaluation, his resting pulse was in the 80s and he had mild blood pressure elevation.  With his leg swelling I recommended he increase furosemide to 40 mg every morning and depending upon residual edema he may need to take an extra 20 mg in the afternoon as needed.  I recommended support stockings and follow-up laboratory.  I last saw him in February 25, 2021 and since his prior evaluation with me he had developed some shortness of breath and wheezing on July 6 which was treated with furosemide.  He is followed by Dr. Inda Merlin.  He has also seen Dr. Elenore Rota and a sleep study demonstrated mild sleep apnea.  He is awaiting a CPAP machine.  Presently he is on atorvastatin 40 mg, furosemide 60 mg in the morning, metoprolol succinate 25 mg, spironolactone 25 mg, valsartan 80 mg, and he continues to take Xarelto for anticoagulation.  During that evaluation, he was taking 60 mg of  Lasix in the morning and there was significant improvement with only residual trivial left ankle swelling.  Since I last saw him, he has continued to remain stable.  He denies any chest pain or shortness of breath.  He underwent a follow-up echo Doppler study on March 25, 2021 which showed an EF of 55 to 60%, mild biatrial enlargement, mild mitral annular calcification with moderate MR and mild aortic sclerosis without stenosis.  Of note, creatinine in August 2022 was 1.2.  He had had recent laboratory by Dr. Inda Merlin on May 24, 2021 and his creatinine was now 2.01.  Potassium was 4.6.  Hemoglobin A1c was 6.6.  Lipid studies revealed an LDL cholesterol at 62 with triglycerides 134 total cholesterol 130 and HDL 45.  He has been taking Lasix 40 mg in the morning ER and 20 mg in the afternoon, metoprolol succinate 25 mg daily, spironolactone 25 mg daily, valsartan 80 mg daily.  He continues to be on Xarelto.  He is on atorvastatin 40 mg. He presents for evaluation.  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   [name]    Past Surgical History:  Procedure Laterality Date   CARDIAC CATHETERIZATION  03/02/2011   Recommended CABG   COLON SURGERY     CORONARY ARTERY BYPASS GRAFT  03/07/2011   DR.VAN  TRIGHT   EYE SURGERY     LEXISCAN MYOVIEW  02/20/2012   EKG negative for ischemia, noraml study   TRANSTHORACIC ECHOCARDIOGRAM  02/20/2012   EF 50-55%, mild-moderate concentric LVH, LA moderately dilated, moderate mitral regurg   VASCULAR DOPPLER  03/03/2011   Nosignificant extracranial carotid artery stenosis    Allergies  Allergen Reactions   Lisinopril     Other reaction(s): cough    Current Outpatient Medications  Medication Sig Dispense Refill   allopurinol (ZYLOPRIM) 100 MG tablet Take 1 tablet by mouth daily.  2   atorvastatin (LIPITOR) 40 MG tablet Take 1 tablet (40 mg total) by mouth at bedtime. 40 tablet 0   Cholecalciferol (VITAMIN D-3) 5000 UNIT/ML LIQD 1 capsule     furosemide (LASIX) 20 MG tablet Take 68m (2 tablets) in the morning, if swelling persists, take another 232m(1 tablet) in afternoon. (Patient taking differently: 20 mg. Take 4038m2 tablets) in the morning, if swelling persists, take another 46m10m tablet) in afternoon.) 180 tablet 3   metoprolol succinate (TOPROL-XL) 25 MG 24 hr tablet TAKE 1 TABLET DAILY 90 tablet 1   spironolactone (ALDACTONE) 25 MG tablet Take 25 mg by mouth daily.     valsartan (DIOVAN) 80 MG tablet Take 80 mg by mouth daily.     vitamin B-12 (CYANOCOBALAMIN) 1000 MCG tablet Take 1,000 mcg by mouth daily.     Vitamin D, Ergocalciferol, (DRISDOL) 1.25 MG (50000 UNIT) CAPS capsule Take 50,000 Units by mouth 3 (three) times a week.     XARELTO 20 MG TABS tablet TAKE 1 TABLET DAILY WITH   SUPPER 90 tablet 1   No current facility-administered medications for this visit.    Social History   Socioeconomic  History   Marital status: Married    Spouse name: SusaManuela Schwartzumber of children: 2   Years of education: college   Highest  education level: Not on file  Occupational History   Occupation: retitred  Tobacco Use   Smoking status: Never   Smokeless tobacco: Never  Vaping Use   Vaping Use: Never used  Substance and Sexual Activity   Alcohol use: No   Drug use: No   Sexual activity: Never  Other Topics Concern   Not on file  Social History Narrative   Lives at home with wife   Right Handed   Drinks caffeine occassionally   Social Determinants of Health   Financial Resource Strain: Not on file  Food Insecurity: Not on file  Transportation Needs: Not on file  Physical Activity: Not on file  Stress: Not on file  Social Connections: Not on file  Intimate Partner Violence: Not on file    Family History  Problem Relation Age of Onset   CVA Father    Heart attack Maternal Grandmother    Heart attack Maternal Grandfather    Heart murmur Sister    Hypertension Son    Socially he is married has 2 children 4 grandchildren. He now exercises fairly regularly. There is no tobacco or alcohol use.   ROS General: Negative; No fevers, chills, or night sweats;  HEENT: Negative; No changes in vision or hearing, sinus congestion, difficulty swallowing Pulmonary: Negative; No cough, wheezing, shortness of breath, hemoptysis Cardiovascular: see history of present illness Positive for intermittent edema GI: Positive for hemorrhoids and he status post hemorrhoidal banding; recent C. difficile infection GU: Recently diagnosed phimosis in need for modified circumcision Musculoskeletal: Positive for gout; right ankle fracture with oblique fracture of the distal fibula Hematologic/Oncology: Negative; no easy bruising, bleeding Endocrine: Negative; no heat/cold intolerance; no diabetes Neuro: Positive for thrombotic stroke; recent probable stroke due to small vessel disease;no changes in balance,  headaches Skin: Negative; No rashes or skin lesions Psychiatric: Negative; No behavioral problems, depression Sleep: Negative; No snoring, daytime sleepiness, hypersomnolence, bruxism, restless legs, hypnogognic hallucinations, no cataplexy Other comprehensive 14 point system review is negative.   PE BP (!) 100/58    Pulse 78    Ht 5' 9"  (1.753 m)    Wt 199 lb 6.4 oz (90.4 kg)    SpO2 96%    BMI 29.45 kg/m    Repeat blood pressure by me was 106/60  Wt Readings from Last 3 Encounters:  06/15/21 199 lb 6.4 oz (90.4 kg)  05/16/21 198 lb (89.8 kg)  02/25/21 198 lb (89.8 kg)   General: Alert, oriented, no distress.  Skin: normal turgor, no rashes, warm and dry HEENT: Normocephalic, atraumatic. Pupils equal round and reactive to light; sclera anicteric; extraocular muscles intact;  Nose without nasal septal hypertrophy Mouth/Parynx benign; Mallinpatti scale 3 Neck: No JVD, no carotid bruits; normal carotid upstroke Lungs: clear to ausculatation and percussion; no wheezing or rales Chest wall: without tenderness to palpitation Heart: PMI not displaced, irregularly irregular rhythm at 78 bpm consistent with his atrial fibrillation, s1 s2 normal, 1/6 systolic murmur, no diastolic murmur, no rubs, gallops, thrills, or heaves Abdomen: soft, nontender; no hepatosplenomehaly, BS+; abdominal aorta nontender and not dilated by palpation. Back: no CVA tenderness Pulses 2+ Musculoskeletal: full range of motion, normal strength, no joint deformities Extremities: no clubbing cyanosis or edema, Homan's sign negative  Neurologic: grossly nonfocal; Cranial nerves grossly wnl Psychologic: Normal mood and affect   December 14, 2022ECG (independently read by me): Atrial fibrillation at 78  February 25, 2021 ECG (independently read by me):  Atrial fibrillation at 68  Nov 15, 2020  ECG (independently read by me): Atrial fibrillation at 83  October 2021 ECG (independently read by me): Atrial fibrillation  at 75, PVC  December 2020 ECG (independently read by me): Atrial fibrillation at 81 bpm.  Poor anterior R wave progression.  QTc interval 446 ms  December 2019 ECG (independently read by me): Atrial fibrillation at 76 bpm QTc interval 443 ms.  No ECG done today, but review of ECG at the time of his nuclear study on 02/20/2017 showed atrial fibrillation at 78 bpm  November 2017 ECG (independently read by me): Atrial fibrillation at 70 bpm.  Poor anterior R-wave progression.  QTc interval 475 ms.  November 2016 ECG (independently read by me): Atrial fibrillation at 67 bpm.  Nonspecific ST changes.  QTc interval normal at 443 ms.  May 2016 ECG (independently read by me): Atrial fibrillation with a controlled ventricular rate in the 60s.  December 2015 ECG (independently read by me): Atrial fibrillation with a ventricular rate at 84.  No significant ST-T changes  June 2015 ECG (independently read by me): Atrial fibrillation with a ventricular rate is 74.  Nonspecific ST changes.  10/09/2013 ECG (independently read by me): Atrial fibrillation with controlled ventricular response at 63 beats per minute.  Prior April 16 2013 ECG: Sinus bradycardia at 52 beats per minute.  LABS: BMP Latest Ref Rng & Units 02/22/2021 08/23/2020 08/16/2020  Glucose 65 - 99 mg/dL 115(H) 113(H) 111(H)  BUN 8 - 27 mg/dL 25 17 21   Creatinine 0.76 - 1.27 mg/dL 1.21 1.21 1.15  BUN/Creat Ratio 10 - 24 21 - -  Sodium 134 - 144 mmol/L 141 140 137  Potassium 3.5 - 5.2 mmol/L 4.3 4.2 3.8  Chloride 96 - 106 mmol/L 100 104 104  CO2 20 - 29 mmol/L 26 28 23   Calcium 8.6 - 10.2 mg/dL 9.8 9.5 8.9   Hepatic Function Latest Ref Rng & Units 02/22/2021 08/12/2020 08/05/2020  Total Protein 6.0 - 8.5 g/dL 6.9 6.1(L) 7.1  Albumin 3.6 - 4.6 g/dL 4.1 2.9(L) 3.3(L)  AST 0 - 40 IU/L 34 25 29  ALT 0 - 44 IU/L 30 20 22   Alk Phosphatase 44 - 121 IU/L 175(H) 64 74  Total Bilirubin 0.0 - 1.2 mg/dL 1.0 1.1 1.3(H)   CBC Latest Ref Rng &  Units 02/22/2021 08/23/2020 08/16/2020  WBC 3.4 - 10.8 x10E3/uL 8.2 7.1 8.3  Hemoglobin 13.0 - 17.7 g/dL 16.2 14.9 14.3  Hematocrit 37.5 - 51.0 % 49.4 46.0 43.6  Platelets 150 - 450 x10E3/uL 183 202 179   Lab Results  Component Value Date   MCV 88 02/22/2021   MCV 93.1 08/23/2020   MCV 91.6 08/16/2020   Lab Results  Component Value Date   TSH 2.560 02/22/2021   Lab Results  Component Value Date   HGBA1C 6.5 (H) 08/06/2020   Lipid Panel     Component Value Date/Time   CHOL 130 02/22/2021 1028   TRIG 121 02/22/2021 1028   HDL 43 02/22/2021 1028   CHOLHDL 3.0 02/22/2021 1028   CHOLHDL 2.8 08/06/2020 0604   VLDL 20 08/06/2020 0604   LDLCALC 65 02/22/2021 1028    IMPRESSION:  No diagnosis found.  ASSESSMENT AND PLAN: Mr. Heindl is an 80 year-old white male who underwent CABG revascularization surgery for severe multivessel CAD in September 2012.  He suffered a small thrombotic CVA most likely due to atrial fibrillation etiology in March 2015 and had complete resolution of symptoms.  He has permanent atrial fibrillation with a  controlled ventricular response for which he is entirely asymptomatic.  He continues to be on Xarelto anticoagulation therapy. His last nuclear stress test in August 2018 continued to show normal perfusion without scar or ischemia.  There was some discordance to the EF on the nuclear study when compared to the echo.  The echo revealed an EF of 55% without wall motion abnormality, severe LA dilatation, which undoubtedly is contributed by his atrial fibrillation.  On July 07, 2020 he had fallen and sustained a oblique fracture of the distal fibula.  In August 05, 2020 he was hospitalized with left-sided weakness and numbness after recurrent fall.  He had resolution of left lower extremity symptoms but he continued to have weakness in his left hand.  An MRI of the brain showed probable acute subacute infarct in the periventricular white matter and it was felt by  Dr. Leonie Man that his stroke was due to small vessel disease.  He has continued to be on Xarelto for his permanent atrial fibrillation and chronic anticoagulation.  When I last saw him, he was taking Lasix 60 mg in the morning as it was significant improvement in his previous edema.  His blood pressure today is on the low side and he has been taking furosemide 40 mg daily, spironolactone 25 mg, valsartan 80 mg in addition to metoprolol succinate 25 mg daily.  With his increased creatinine now at 2.01, I have recommended he decrease spironolactone down to 12.5 mg and recommended he try changing his furosemide to 40 mg alternating with 20 mg every other day as long as edema does not recur.  In 1 to 2 weeks have recommended follow-up laboratory with a comprehensive metabolic panel and BNP.  We will also be following up with Dr. Inda Merlin.  He continues to be on Xarelto with his atrial fibrillation and has been without bleeding.  I reviewed his most recent echo from March 25, 2021 which shows EF of 55 to 60% with mild biatrial enlargement, mild mitral annular calcification with moderate MR and mild aortic sclerosis without stenosis.  I will contact him regarding his laboratory and see him in 3 to 4 months for follow-up evaluation.   Troy Sine, MD, [facility]  06/15/2021 12:06 PM

## 2021-06-15 NOTE — Patient Instructions (Signed)
Medication Instructions:  Decrease Spironolactone to 12.5 mg daily  Decrease Lasix alternating 40 mg with 20 mg.   *If you need a refill on your cardiac medications before your next appointment, please call your pharmacy*   Lab Work: 1-2 weeks come back for blood work (CMET, BNP)   If you have labs (blood work) drawn today and your tests are completely normal, you will receive your results only by: [place] (if you have MyChart) OR A paper copy in the mail If you have any lab test that is abnormal or we need to change your treatment, we will call you to review the results.  Follow-Up: At [facility], you and your health needs are our priority.  As part of our continuing mission to provide you with exceptional heart care, we have created designated Provider Care Teams.  These Care Teams include your primary Cardiologist (physician) and Advanced Practice Providers (APPs -  Physician Assistants and Nurse Practitioners) who all work together to provide you with the care you need, when you need it.  We recommend signing up for the patient portal called "MyChart".  Sign up information is provided on this After Visit Summary.  MyChart is used to connect with patients for Virtual Visits (Telemedicine).  Patients are able to view lab/test results, encounter notes, upcoming appointments, etc.  Non-urgent messages can be sent to your provider as well.   To learn more about what you can do with MyChart, go to [email or web address].    Your next appointment:   3-4 month(s)  The format for your next appointment:   In Person  Provider:   Shelva Majestic, MD

## 2021-06-16 DIAGNOSIS — C44212 Basal cell carcinoma of skin of right ear and external auricular canal: Secondary | ICD-10-CM | POA: Diagnosis not present

## 2021-06-18 DIAGNOSIS — G4733 Obstructive sleep apnea (adult) (pediatric): Secondary | ICD-10-CM | POA: Diagnosis not present

## 2021-06-21 ENCOUNTER — Encounter: Payer: Self-pay | Admitting: Cardiovascular Disease

## 2021-06-24 ENCOUNTER — Other Ambulatory Visit: Payer: Self-pay | Admitting: Cardiovascular Disease

## 2021-06-29 DIAGNOSIS — I2510 Atherosclerotic heart disease of native coronary artery without angina pectoris: Secondary | ICD-10-CM | POA: Diagnosis not present

## 2021-06-29 DIAGNOSIS — I4891 Unspecified atrial fibrillation: Secondary | ICD-10-CM | POA: Diagnosis not present

## 2021-06-29 DIAGNOSIS — I2583 Coronary atherosclerosis due to lipid rich plaque: Secondary | ICD-10-CM | POA: Diagnosis not present

## 2021-06-29 DIAGNOSIS — I10 Essential (primary) hypertension: Secondary | ICD-10-CM | POA: Diagnosis not present

## 2021-06-30 LAB — COMPREHENSIVE METABOLIC PANEL
ALT: 29 IU/L (ref 0–44)
AST: 32 IU/L (ref 0–40)
Albumin/Globulin Ratio: 1.3 (ref 1.2–2.2)
Albumin: 3.7 g/dL (ref 3.6–4.6)
Alkaline Phosphatase: 139 IU/L — ABNORMAL HIGH (ref 44–121)
BUN/Creatinine Ratio: 20 (ref 10–24)
BUN: 36 mg/dL — ABNORMAL HIGH (ref 8–27)
Bilirubin Total: 0.7 mg/dL (ref 0.0–1.2)
CO2: 24 mmol/L (ref 20–29)
Calcium: 9.4 mg/dL (ref 8.6–10.2)
Chloride: 102 mmol/L (ref 96–106)
Creatinine, Ser: 1.78 mg/dL — ABNORMAL HIGH (ref 0.76–1.27)
Globulin, Total: 2.8 g/dL (ref 1.5–4.5)
Glucose: 135 mg/dL — ABNORMAL HIGH (ref 70–99)
Potassium: 4.8 mmol/L (ref 3.5–5.2)
Sodium: 141 mmol/L (ref 134–144)
Total Protein: 6.5 g/dL (ref 6.0–8.5)
eGFR: 38 mL/min/{1.73_m2} — ABNORMAL LOW (ref >59)

## 2021-06-30 LAB — BRAIN NATRIURETIC PEPTIDE: BNP: 113.2 pg/mL — ABNORMAL HIGH (ref 0.0–100.0)

## 2021-07-02 DIAGNOSIS — G4733 Obstructive sleep apnea (adult) (pediatric): Secondary | ICD-10-CM | POA: Diagnosis not present

## 2021-07-08 ENCOUNTER — Other Ambulatory Visit: Payer: Self-pay

## 2021-07-08 DIAGNOSIS — I4891 Unspecified atrial fibrillation: Secondary | ICD-10-CM

## 2021-07-11 ENCOUNTER — Telehealth: Payer: Self-pay | Admitting: Cardiovascular Disease

## 2021-07-11 ENCOUNTER — Encounter: Payer: Self-pay | Admitting: Cardiovascular Disease

## 2021-07-11 NOTE — Telephone Encounter (Signed)
Patient's wife called Dr. Claiborne Billings or nurse about test results

## 2021-07-15 ENCOUNTER — Other Ambulatory Visit: Payer: Self-pay

## 2021-07-15 MED ORDER — RIVAROXABAN 20 MG PO TABS: ORAL_TABLET | ORAL | 1 refills | Status: DC

## 2021-07-15 MED ORDER — VALSARTAN 80 MG PO TABS: 80.0000 mg | ORAL_TABLET | Freq: Every day | ORAL | 1 refills | Status: DC

## 2021-07-28 ENCOUNTER — Other Ambulatory Visit: Payer: Self-pay

## 2021-07-28 DIAGNOSIS — I4891 Unspecified atrial fibrillation: Secondary | ICD-10-CM | POA: Diagnosis not present

## 2021-07-29 ENCOUNTER — Other Ambulatory Visit: Payer: Self-pay | Admitting: Cardiovascular Disease

## 2021-07-29 LAB — BASIC METABOLIC PANEL
BUN/Creatinine Ratio: 15 (ref 10–24)
BUN: 30 mg/dL — ABNORMAL HIGH (ref 8–27)
CO2: 24 mmol/L (ref 20–29)
Calcium: 9.5 mg/dL (ref 8.6–10.2)
Chloride: 103 mmol/L (ref 96–106)
Creatinine, Ser: 2.04 mg/dL — ABNORMAL HIGH (ref 0.76–1.27)
Glucose: 106 mg/dL — ABNORMAL HIGH (ref 70–99)
Potassium: 4.2 mmol/L (ref 3.5–5.2)
Sodium: 144 mmol/L (ref 134–144)
eGFR: 32 mL/min/{1.73_m2} — ABNORMAL LOW (ref >59)

## 2021-08-02 DIAGNOSIS — G4733 Obstructive sleep apnea (adult) (pediatric): Secondary | ICD-10-CM | POA: Diagnosis not present

## 2021-08-05 ENCOUNTER — Other Ambulatory Visit: Payer: Self-pay

## 2021-08-05 DIAGNOSIS — I4891 Unspecified atrial fibrillation: Secondary | ICD-10-CM

## 2021-08-05 DIAGNOSIS — R600 Localized edema: Secondary | ICD-10-CM

## 2021-08-24 ENCOUNTER — Other Ambulatory Visit: Payer: Self-pay

## 2021-08-24 DIAGNOSIS — I4891 Unspecified atrial fibrillation: Secondary | ICD-10-CM | POA: Diagnosis not present

## 2021-08-24 DIAGNOSIS — R600 Localized edema: Secondary | ICD-10-CM

## 2021-08-25 LAB — BASIC METABOLIC PANEL
BUN/Creatinine Ratio: 18 (ref 10–24)
BUN: 31 mg/dL — ABNORMAL HIGH (ref 8–27)
CO2: 23 mmol/L (ref 20–29)
Calcium: 9.4 mg/dL (ref 8.6–10.2)
Chloride: 105 mmol/L (ref 96–106)
Creatinine, Ser: 1.73 mg/dL — ABNORMAL HIGH (ref 0.76–1.27)
Glucose: 119 mg/dL — ABNORMAL HIGH (ref 70–99)
Potassium: 4.8 mmol/L (ref 3.5–5.2)
Sodium: 142 mmol/L (ref 134–144)
eGFR: 39 mL/min/{1.73_m2} — ABNORMAL LOW (ref >59)

## 2021-08-29 DIAGNOSIS — G4733 Obstructive sleep apnea (adult) (pediatric): Secondary | ICD-10-CM | POA: Diagnosis not present

## 2021-08-30 DIAGNOSIS — G4733 Obstructive sleep apnea (adult) (pediatric): Secondary | ICD-10-CM | POA: Diagnosis not present

## 2021-09-26 DIAGNOSIS — G4733 Obstructive sleep apnea (adult) (pediatric): Secondary | ICD-10-CM | POA: Diagnosis not present

## 2021-09-30 DIAGNOSIS — G4733 Obstructive sleep apnea (adult) (pediatric): Secondary | ICD-10-CM | POA: Diagnosis not present

## 2021-10-03 ENCOUNTER — Encounter: Payer: Self-pay | Admitting: Cardiovascular Disease

## 2021-10-04 ENCOUNTER — Ambulatory Visit: Payer: Medicare HMO | Admitting: Cardiovascular Disease

## 2021-10-04 ENCOUNTER — Encounter: Payer: Self-pay | Admitting: Cardiovascular Disease

## 2021-10-04 DIAGNOSIS — R059 Cough, unspecified: Secondary | ICD-10-CM | POA: Diagnosis not present

## 2021-10-04 DIAGNOSIS — U071 COVID-19: Secondary | ICD-10-CM | POA: Diagnosis not present

## 2021-10-04 DIAGNOSIS — J069 Acute upper respiratory infection, unspecified: Secondary | ICD-10-CM | POA: Diagnosis not present

## 2021-10-04 DIAGNOSIS — R062 Wheezing: Secondary | ICD-10-CM | POA: Diagnosis not present

## 2021-10-04 DIAGNOSIS — I509 Heart failure, unspecified: Secondary | ICD-10-CM | POA: Diagnosis not present

## 2021-10-04 NOTE — Telephone Encounter (Signed)
If significant swellling can increase lasix to 40 mg for the next several days, and if decreased resume 20 mg and take 40 mg on PRN basis.; re-check bmet in one week with Cr 1.9 ?

## 2021-10-04 NOTE — Telephone Encounter (Signed)
Ignore routing message to Dorisann Frames by mistake- will have Dr.Kelly made aware of patient. ?

## 2021-10-04 NOTE — Telephone Encounter (Signed)
Spouse advised of patient's new appointment with Dr. Claiborne Billings on 5/4 at 1:30. ?

## 2021-10-04 NOTE — Telephone Encounter (Addendum)
Spoke to spouse who reports patient was seen by Dr. Okey Dupre, PCP for nasal congestion. Dx. With viral infection, no restrictions. Also, patient gained 12 pounds and PCP thinks there needs to be an increase in lasix. Spouse asked me to call PCP's office to get lab results. Spoke to PCP office and they will fax lab results as soon as they are available. Spouse advised. ?Right after this note, PCP called to say patient is positive for COVID. Pt. made aware. Will reschedule with Dr. Claiborne Billings. ?

## 2021-10-05 NOTE — Telephone Encounter (Signed)
Patient and wife state there is no swelling. Patient will take 40 mg lasix if swelling. She is asking for the handicap parking sticker to be signed and she will pick it up. ?

## 2021-10-11 DIAGNOSIS — N1832 Chronic kidney disease, stage 3b: Secondary | ICD-10-CM | POA: Diagnosis not present

## 2021-10-14 DIAGNOSIS — N471 Phimosis: Secondary | ICD-10-CM | POA: Diagnosis not present

## 2021-10-14 DIAGNOSIS — R310 Gross hematuria: Secondary | ICD-10-CM | POA: Diagnosis not present

## 2021-10-21 DIAGNOSIS — K8020 Calculus of gallbladder without cholecystitis without obstruction: Secondary | ICD-10-CM | POA: Diagnosis not present

## 2021-10-21 DIAGNOSIS — R310 Gross hematuria: Secondary | ICD-10-CM | POA: Diagnosis not present

## 2021-10-21 DIAGNOSIS — N3289 Other specified disorders of bladder: Secondary | ICD-10-CM | POA: Diagnosis not present

## 2021-10-21 DIAGNOSIS — K7689 Other specified diseases of liver: Secondary | ICD-10-CM | POA: Diagnosis not present

## 2021-10-27 DIAGNOSIS — G4733 Obstructive sleep apnea (adult) (pediatric): Secondary | ICD-10-CM | POA: Diagnosis not present

## 2021-10-30 DIAGNOSIS — G4733 Obstructive sleep apnea (adult) (pediatric): Secondary | ICD-10-CM | POA: Diagnosis not present

## 2021-11-03 ENCOUNTER — Encounter: Payer: Self-pay | Admitting: Cardiovascular Disease

## 2021-11-03 ENCOUNTER — Ambulatory Visit: Payer: Medicare HMO | Admitting: Cardiovascular Disease

## 2021-11-03 VITALS — BP 122/64 | HR 65 | Ht 69.0 in | Wt 211.0 lb

## 2021-11-03 DIAGNOSIS — Z951 Presence of aortocoronary bypass graft: Secondary | ICD-10-CM

## 2021-11-03 DIAGNOSIS — Z7901 Long term (current) use of anticoagulants: Secondary | ICD-10-CM

## 2021-11-03 DIAGNOSIS — U071 COVID-19: Secondary | ICD-10-CM

## 2021-11-03 DIAGNOSIS — I4819 Other persistent atrial fibrillation: Secondary | ICD-10-CM

## 2021-11-03 DIAGNOSIS — I25118 Atherosclerotic heart disease of native coronary artery with other forms of angina pectoris: Secondary | ICD-10-CM

## 2021-11-03 DIAGNOSIS — I10 Essential (primary) hypertension: Secondary | ICD-10-CM | POA: Diagnosis not present

## 2021-11-03 DIAGNOSIS — G4733 Obstructive sleep apnea (adult) (pediatric): Secondary | ICD-10-CM | POA: Diagnosis not present

## 2021-11-03 DIAGNOSIS — I4891 Unspecified atrial fibrillation: Secondary | ICD-10-CM

## 2021-11-03 DIAGNOSIS — Z5181 Encounter for therapeutic drug level monitoring: Secondary | ICD-10-CM | POA: Diagnosis not present

## 2021-11-03 DIAGNOSIS — N1832 Chronic kidney disease, stage 3b: Secondary | ICD-10-CM | POA: Diagnosis not present

## 2021-11-03 MED ORDER — RIVAROXABAN 15 MG PO TABS: ORAL_TABLET | ORAL | 1 refills | Status: DC

## 2021-11-03 NOTE — Progress Notes (Signed)
Patient ID: Shane Espinoza, male   DOB: [birth year], 81 y.o.   MRN: [id number] ? ? ? ? ? ?HPI: Shane Espinoza is a 81 y.o. male who presents to the office for a 6 month cardiology evaluation. ?   ?Shane Espinoza has established CAD and underwent CABG revascularization surgery by Dr. Nils Pyle on 03/07/2011 after a nuclear perfusion study revealed significant scar/ischemia and cardiac catheterization demonstrated severe multivessel CAD. He had aLIMA to the LAD, vein to the OM, vein to the RV marginal, and vein to the PDA. He  developed PAF postoperatively and underwent TEE guided cardioversion with restoration of sinus rhythm. He completed cardiac rehabilitation. A nuclear perfusion study in August 2013 was markedly improved did not reveal any region of scar or ischemia. He has been off amiodarone and maintaining sinus rhythm. Additional problems include hypertension, hyperlipidemia, and intermittent lower extremity edema. ? ?He suffered a CVA on 09/23/2013 and had difficulty processing thoughts.  An MRI of his brain showed a tiny left operculum, hemorrhage, infarct, is concerned that potentially this was thrombotic.  He did not have any loss of function of the upper or lower extremities and denied any headache, dizziness, visual symptoms, or difficulty swallowing.  His ECG at that time did suggest sinus rhythm.  He now is on anticoagulation with Xarelto 20 mg. His head CT was negative as was his MRA of the large and medium size vessels.  His carotids were widely patent.  He denies chest pain.  He denies any significant neurologic sequelae. ? ?He is  in permanent atrial fibrillation on chronic anticoagulation with Xarelto.  He has had episodes of recurrent gout.  I had taken him off HCTZ since this could be contributing to elevation of uric acid.  He is now on low-dose chronic alliupurinol and since instituting therapy has not had any recurrent gout episodes.  ? ?When I saw him in October 2018 he denied  any episodes of chest pain, palpitations, presyncope or syncope.  He underwent a nuclear stress test on 02/20/2017 which showed an EF of 47% with mild diffuse hypokinesis.  Is no evidence for scar or ischemia.  Because of the slightly reduced EF.  He underwent a echo Doppler study on 03/14/2017.  This showed an estimated EF at 55% without regional wall motion abnormalities.  His left atrium was severely dilated.  Contributed by his atrial fibrillation.  He has had issues with some intermittent leg swelling and was on amlodipine at 2.5 mg, which was reduced because of edema, furosemide 20 mg 5 days per week, irbesartan 300 mg and metoprolol, tartrate 25 mg in the morning and 12.5 mmol at night.  He is on simvastatin for hyperlipidemia and Xarelto for anticoagulation.   ? ?I saw him on June 04, 2018 at which time he remained relatively stable from a cardiac standpoint.  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  He does use a recumbent bike at the gym.  He underwent laboratory by Dr. Inda Merlin September 2019 which showed a glucose of 134.  Renal function was normal.  Lipid studies are excellent with a total cholesterol 111 HDL 37 LDL 53 and triglycerides 105.  Creatinine was 0.99.  Hemoglobin was 15.  He has been wearing support stockings 15 to 20 mm which has been helpful for his leg swelling.  He states his blood pressure typically runs in the 140s to 150s.  His pulse typically runs in the 50s  to 75s and occasionally in the 70s.   ? ?When I  saw him in December 2020 he was not able to go to the gym due to the COVID-19 pandemic.  His main exercise was walking his dog.  He admits to some weight gain.  He denied anginal symptoms.  He has had follow-up lab work with his primary physician Dr. Inda Merlin and hemoglobin A1c was 6.2.  He wears support stockings most of the time.  He denies bleeding on anticoagulation.  His atrial fibrillation rate  has been well  controlled.  At that evaluation, his blood pressure was stable on amlodipine at a reduced dose of 2.5 mg, metoprolol succinate 25 mg, valsartan 320 mg and spironolactone 12.5 mg. ? ?I saw him in October 2021.  Over the prior year he had done fairly well from a cardiovascular standpoint.    However, he had recently had diarrhea and was diagnosed with C. difficile and E. coli for which he took antibiotics.  He also is uncircumcised and his have been difficulty with scarring.  He has seen Dr. Diona Fanti of [facility] and was told of having phimosis.  He is in need to undergo a modified circumcision and has been on anticoagulation therapy.  ? ?Since I saw him, he had fallen and fractured his right ankle in January 2022.  He was hospitalized in February 2022 from February 9 through February 24 with left-sided weakness and numbness and recurrent fall.  He continued to have some weakness in his left hand.  An MRI of his brain showed a focus of restricted diffusion with acute/subacute infarct and periventricular white matter.  He was evaluated by Dr. Leonie Man who felt a stroke was due to small vessel disease.  An echo Doppler study on August 05, 2020 showed an EF of 55 to 60% with grade 1 diastolic dysfunction, mild biatrial enlargement, and aortic valve sclerosis.  He was maintained on Xarelto anticoagulation.  Metformin was added for blood sugar control and he also underwent rehabilitation.  Subsequently, he developed a left leg wound culture positive for Pseudomonas for which she was treated with Cipro and additional antibiotic. ? ?When I saw him on Nov 15, 2020 he denied any chest pain but he was having some ankle swelling.  He was undergoing neuro rehabilitation.  He continued to be on atorvastatin 40 mg for hyperlipidemia.  He has been on furosemide 20 or 40 mg depending upon edema, metoprolol succinate 25 mg daily, metformin and continues to be on Xarelto with his atrial fibrillation anticoagulation.  During that  evaluation, his resting pulse was in the 80s and he had mild blood pressure elevation.  With his leg swelling I recommended he increase furosemide to 40 mg every morning and depending upon residual edema he may need to take an extra 20 mg in the afternoon as needed.  I recommended support stockings and follow-up laboratory. ? ?I saw him in February 25, 2021 and since his prior evaluation with me he had developed some shortness of breath and wheezing on July 6 which was treated with furosemide.  He is followed by Dr. Inda Merlin.  He has also seen Dr. Elenore Rota and a sleep study demonstrated mild sleep apnea.  He is awaiting a CPAP machine.  Presently he is on atorvastatin 40 mg, furosemide 60 mg in the morning, metoprolol succinate 25 mg, spironolactone 25 mg, valsartan 80 mg, and he continues to take Xarelto for anticoagulation.  During that evaluation, he was taking 60 mg of Lasix  in the morning and there was significant improvement with only residual trivial left ankle swelling. ? ?I last saw him on June 15, 2021.  At that time he continued to be stable and denied any chest pain or shortness of breath.  He underwent a follow-up echo Doppler study on March 25, 2021 which showed an EF of 55 to 60%, mild biatrial enlargement, mild mitral annular calcification with moderate MR and mild aortic sclerosis without stenosis.  Of note, creatinine in August 2022 was 1.2.  He had had recent laboratory by Dr. Inda Merlin on May 24, 2021 and his creatinine was now 2.01.  Potassium was 4.6.  Hemoglobin A1c was 6.6.  Lipid studies revealed an LDL cholesterol at 62 with triglycerides 134 total cholesterol 130 and HDL 45.  He has been taking Lasix 40 mg in the morning ER and 20 mg in the afternoon, metoprolol succinate 25 mg daily, spironolactone 25 mg daily, valsartan 80 mg daily.  He continues to be on Xarelto.  He is on atorvastatin 40 mg.  During that evaluation, his atrial fibrillation was controlled with a rate in the 70s.   Blood pressure was stable but on the low side.  I recommended he decrease spironolactone down to 12.5 mg and recommended he change his furosemide from 40 mg daily down to 40 mg alternating with 20 mg every

## 2021-11-03 NOTE — Patient Instructions (Signed)
Medication Instructions:  ?Decrease Xarelto to 15 mg daily ? ?- you can hold the Xarelto 3 days before the surgery -  ? ?*If you need a refill on your cardiac medications before your next appointment, please call your pharmacy* ? ? ?Follow-Up: ?At [facility], you and your health needs are our priority.  As part of our continuing mission to provide you with exceptional heart care, we have created designated Provider Care Teams.  These Care Teams include your primary Cardiologist (physician) and Advanced Practice Providers (APPs -  Physician Assistants and Nurse Practitioners) who all work together to provide you with the care you need, when you need it. ? ?We recommend signing up for the patient portal called "MyChart".  Sign up information is provided on this After Visit Summary.  MyChart is used to connect with patients for Virtual Visits (Telemedicine).  Patients are able to view lab/test results, encounter notes, upcoming appointments, etc.  Non-urgent messages can be sent to your provider as well.   ?To learn more about what you can do with MyChart, go to [email or web address].   ? ?Your next appointment:   ?6 month(s) ? ?The format for your next appointment:   ?In Person ? ?Provider:   ?Shelva Majestic, MD   ? ? ? ? ? ? ? ? ?

## 2021-11-07 ENCOUNTER — Encounter: Payer: Self-pay | Admitting: Cardiovascular Disease

## 2021-11-07 MED ORDER — RIVAROXABAN 15 MG PO TABS: ORAL_TABLET | ORAL | 1 refills | Status: DC

## 2021-11-12 ENCOUNTER — Encounter: Payer: Self-pay | Admitting: Cardiovascular Disease

## 2021-11-23 DIAGNOSIS — M109 Gout, unspecified: Secondary | ICD-10-CM | POA: Diagnosis not present

## 2021-11-23 DIAGNOSIS — R7309 Other abnormal glucose: Secondary | ICD-10-CM | POA: Diagnosis not present

## 2021-11-23 DIAGNOSIS — D6869 Other thrombophilia: Secondary | ICD-10-CM | POA: Diagnosis not present

## 2021-11-23 DIAGNOSIS — Z8673 Personal history of transient ischemic attack (TIA), and cerebral infarction without residual deficits: Secondary | ICD-10-CM | POA: Diagnosis not present

## 2021-11-23 DIAGNOSIS — Z79899 Other long term (current) drug therapy: Secondary | ICD-10-CM | POA: Diagnosis not present

## 2021-11-23 DIAGNOSIS — E782 Mixed hyperlipidemia: Secondary | ICD-10-CM | POA: Diagnosis not present

## 2021-11-23 DIAGNOSIS — R609 Edema, unspecified: Secondary | ICD-10-CM | POA: Diagnosis not present

## 2021-11-23 DIAGNOSIS — N1832 Chronic kidney disease, stage 3b: Secondary | ICD-10-CM | POA: Diagnosis not present

## 2021-11-23 DIAGNOSIS — I5043 Acute on chronic combined systolic (congestive) and diastolic (congestive) heart failure: Secondary | ICD-10-CM | POA: Diagnosis not present

## 2021-11-23 DIAGNOSIS — G4730 Sleep apnea, unspecified: Secondary | ICD-10-CM | POA: Diagnosis not present

## 2021-11-23 DIAGNOSIS — I4891 Unspecified atrial fibrillation: Secondary | ICD-10-CM | POA: Diagnosis not present

## 2021-11-23 DIAGNOSIS — I10 Essential (primary) hypertension: Secondary | ICD-10-CM | POA: Diagnosis not present

## 2021-11-26 DIAGNOSIS — I2510 Atherosclerotic heart disease of native coronary artery without angina pectoris: Secondary | ICD-10-CM | POA: Diagnosis not present

## 2021-11-26 DIAGNOSIS — I4891 Unspecified atrial fibrillation: Secondary | ICD-10-CM | POA: Diagnosis not present

## 2021-11-26 DIAGNOSIS — I69354 Hemiplegia and hemiparesis following cerebral infarction affecting left non-dominant side: Secondary | ICD-10-CM | POA: Diagnosis not present

## 2021-11-26 DIAGNOSIS — D6869 Other thrombophilia: Secondary | ICD-10-CM | POA: Diagnosis not present

## 2021-11-26 DIAGNOSIS — E261 Secondary hyperaldosteronism: Secondary | ICD-10-CM | POA: Diagnosis not present

## 2021-11-26 DIAGNOSIS — I509 Heart failure, unspecified: Secondary | ICD-10-CM | POA: Diagnosis not present

## 2021-11-26 DIAGNOSIS — I4892 Unspecified atrial flutter: Secondary | ICD-10-CM | POA: Diagnosis not present

## 2021-11-26 DIAGNOSIS — E1151 Type 2 diabetes mellitus with diabetic peripheral angiopathy without gangrene: Secondary | ICD-10-CM | POA: Diagnosis not present

## 2021-11-26 DIAGNOSIS — I110 Hypertensive heart disease with heart failure: Secondary | ICD-10-CM | POA: Diagnosis not present

## 2021-11-26 DIAGNOSIS — E785 Hyperlipidemia, unspecified: Secondary | ICD-10-CM | POA: Diagnosis not present

## 2021-11-26 DIAGNOSIS — E559 Vitamin D deficiency, unspecified: Secondary | ICD-10-CM | POA: Diagnosis not present

## 2021-11-26 DIAGNOSIS — I252 Old myocardial infarction: Secondary | ICD-10-CM | POA: Diagnosis not present

## 2021-11-30 DIAGNOSIS — G4733 Obstructive sleep apnea (adult) (pediatric): Secondary | ICD-10-CM | POA: Diagnosis not present

## 2021-12-09 DIAGNOSIS — I129 Hypertensive chronic kidney disease with stage 1 through stage 4 chronic kidney disease, or unspecified chronic kidney disease: Secondary | ICD-10-CM | POA: Diagnosis not present

## 2021-12-09 DIAGNOSIS — I509 Heart failure, unspecified: Secondary | ICD-10-CM | POA: Diagnosis not present

## 2021-12-09 DIAGNOSIS — N1832 Chronic kidney disease, stage 3b: Secondary | ICD-10-CM | POA: Diagnosis not present

## 2021-12-09 DIAGNOSIS — E559 Vitamin D deficiency, unspecified: Secondary | ICD-10-CM | POA: Diagnosis not present

## 2021-12-09 DIAGNOSIS — M1000 Idiopathic gout, unspecified site: Secondary | ICD-10-CM | POA: Diagnosis not present

## 2021-12-14 ENCOUNTER — Other Ambulatory Visit: Payer: Self-pay | Admitting: Cardiovascular Disease

## 2021-12-14 DIAGNOSIS — H903 Sensorineural hearing loss, bilateral: Secondary | ICD-10-CM | POA: Diagnosis not present

## 2021-12-16 DIAGNOSIS — N471 Phimosis: Secondary | ICD-10-CM | POA: Diagnosis not present

## 2021-12-16 DIAGNOSIS — R310 Gross hematuria: Secondary | ICD-10-CM | POA: Diagnosis not present

## 2021-12-31 ENCOUNTER — Other Ambulatory Visit: Payer: Self-pay | Admitting: Cardiovascular Disease

## 2022-01-23 DIAGNOSIS — H0011 Chalazion right upper eyelid: Secondary | ICD-10-CM | POA: Diagnosis not present

## 2022-01-26 DIAGNOSIS — G4733 Obstructive sleep apnea (adult) (pediatric): Secondary | ICD-10-CM | POA: Diagnosis not present

## 2022-01-30 DIAGNOSIS — R3912 Poor urinary stream: Secondary | ICD-10-CM | POA: Diagnosis not present

## 2022-01-30 DIAGNOSIS — N471 Phimosis: Secondary | ICD-10-CM | POA: Diagnosis not present

## 2022-02-02 DIAGNOSIS — H2512 Age-related nuclear cataract, left eye: Secondary | ICD-10-CM | POA: Diagnosis not present

## 2022-02-02 DIAGNOSIS — H35372 Puckering of macula, left eye: Secondary | ICD-10-CM | POA: Diagnosis not present

## 2022-02-02 DIAGNOSIS — H524 Presbyopia: Secondary | ICD-10-CM | POA: Diagnosis not present

## 2022-02-02 DIAGNOSIS — Z961 Presence of intraocular lens: Secondary | ICD-10-CM | POA: Diagnosis not present

## 2022-02-26 DIAGNOSIS — G4733 Obstructive sleep apnea (adult) (pediatric): Secondary | ICD-10-CM | POA: Diagnosis not present

## 2022-03-29 DIAGNOSIS — G4733 Obstructive sleep apnea (adult) (pediatric): Secondary | ICD-10-CM | POA: Diagnosis not present

## 2022-04-04 DIAGNOSIS — N1832 Chronic kidney disease, stage 3b: Secondary | ICD-10-CM | POA: Diagnosis not present

## 2022-04-11 DIAGNOSIS — I130 Hypertensive heart and chronic kidney disease with heart failure and stage 1 through stage 4 chronic kidney disease, or unspecified chronic kidney disease: Secondary | ICD-10-CM | POA: Diagnosis not present

## 2022-04-11 DIAGNOSIS — Z23 Encounter for immunization: Secondary | ICD-10-CM | POA: Diagnosis not present

## 2022-04-11 DIAGNOSIS — N320 Bladder-neck obstruction: Secondary | ICD-10-CM | POA: Diagnosis not present

## 2022-04-11 DIAGNOSIS — N1832 Chronic kidney disease, stage 3b: Secondary | ICD-10-CM | POA: Diagnosis not present

## 2022-04-11 DIAGNOSIS — E559 Vitamin D deficiency, unspecified: Secondary | ICD-10-CM | POA: Diagnosis not present

## 2022-04-11 DIAGNOSIS — I509 Heart failure, unspecified: Secondary | ICD-10-CM | POA: Diagnosis not present

## 2022-05-08 ENCOUNTER — Other Ambulatory Visit: Payer: Self-pay | Admitting: Cardiovascular Disease

## 2022-05-08 NOTE — Telephone Encounter (Signed)
Prescription refill request for Xarelto received.  Indication:afib Last office visit:5/23 Weight:95.7 kg Age:81 Scr:1.9 CrCl:40.57  ml/min  Prescription refilled

## 2022-05-23 DIAGNOSIS — G4733 Obstructive sleep apnea (adult) (pediatric): Secondary | ICD-10-CM | POA: Diagnosis not present

## 2022-05-23 DIAGNOSIS — I2510 Atherosclerotic heart disease of native coronary artery without angina pectoris: Secondary | ICD-10-CM | POA: Diagnosis not present

## 2022-05-23 DIAGNOSIS — I4891 Unspecified atrial fibrillation: Secondary | ICD-10-CM | POA: Diagnosis not present

## 2022-05-29 DIAGNOSIS — N471 Phimosis: Secondary | ICD-10-CM | POA: Diagnosis not present

## 2022-05-29 DIAGNOSIS — R3912 Poor urinary stream: Secondary | ICD-10-CM | POA: Diagnosis not present

## 2022-05-30 DIAGNOSIS — L821 Other seborrheic keratosis: Secondary | ICD-10-CM | POA: Diagnosis not present

## 2022-05-30 DIAGNOSIS — I872 Venous insufficiency (chronic) (peripheral): Secondary | ICD-10-CM | POA: Diagnosis not present

## 2022-05-30 DIAGNOSIS — D1801 Hemangioma of skin and subcutaneous tissue: Secondary | ICD-10-CM | POA: Diagnosis not present

## 2022-05-30 DIAGNOSIS — L853 Xerosis cutis: Secondary | ICD-10-CM | POA: Diagnosis not present

## 2022-05-30 DIAGNOSIS — I8311 Varicose veins of right lower extremity with inflammation: Secondary | ICD-10-CM | POA: Diagnosis not present

## 2022-05-30 DIAGNOSIS — I8312 Varicose veins of left lower extremity with inflammation: Secondary | ICD-10-CM | POA: Diagnosis not present

## 2022-05-30 DIAGNOSIS — L570 Actinic keratosis: Secondary | ICD-10-CM | POA: Diagnosis not present

## 2022-05-30 DIAGNOSIS — Z85828 Personal history of other malignant neoplasm of skin: Secondary | ICD-10-CM | POA: Diagnosis not present

## 2022-05-31 ENCOUNTER — Encounter: Payer: Self-pay | Admitting: Cardiovascular Disease

## 2022-05-31 ENCOUNTER — Ambulatory Visit: Payer: Medicare HMO | Attending: Cardiovascular Disease | Admitting: Cardiovascular Disease

## 2022-05-31 VITALS — BP 130/72 | HR 71 | Ht 69.0 in | Wt 201.0 lb

## 2022-05-31 DIAGNOSIS — Z951 Presence of aortocoronary bypass graft: Secondary | ICD-10-CM | POA: Diagnosis not present

## 2022-05-31 DIAGNOSIS — G4733 Obstructive sleep apnea (adult) (pediatric): Secondary | ICD-10-CM | POA: Diagnosis not present

## 2022-05-31 DIAGNOSIS — Z7901 Long term (current) use of anticoagulants: Secondary | ICD-10-CM | POA: Diagnosis not present

## 2022-05-31 DIAGNOSIS — I4819 Other persistent atrial fibrillation: Secondary | ICD-10-CM

## 2022-05-31 DIAGNOSIS — E785 Hyperlipidemia, unspecified: Secondary | ICD-10-CM

## 2022-05-31 DIAGNOSIS — I25118 Atherosclerotic heart disease of native coronary artery with other forms of angina pectoris: Secondary | ICD-10-CM

## 2022-05-31 DIAGNOSIS — N1832 Chronic kidney disease, stage 3b: Secondary | ICD-10-CM

## 2022-05-31 NOTE — Patient Instructions (Signed)
Medication Instructions:  Your physician recommends that you continue on your current medications as directed. Please refer to the Current Medication list given to you today.  *If you need a refill on your cardiac medications before your next appointment, please call your pharmacy*   Follow-Up: At [facility], you and your health needs are our priority.  As part of our continuing mission to provide you with exceptional heart care, we have created designated Provider Care Teams.  These Care Teams include your primary Cardiologist (physician) and Advanced Practice Providers (APPs -  Physician Assistants and Nurse Practitioners) who all work together to provide you with the care you need, when you need it.  We recommend signing up for the patient portal called "MyChart".  Sign up information is provided on this After Visit Summary.  MyChart is used to connect with patients for Virtual Visits (Telemedicine).  Patients are able to view lab/test results, encounter notes, upcoming appointments, etc.  Non-urgent messages can be sent to your provider as well.   To learn more about what you can do with MyChart, go to [email or web address].    Your next appointment:   6 month(s)  The format for your next appointment:   In Person  Provider:   Shelva Majestic, MD

## 2022-05-31 NOTE — Progress Notes (Signed)
Patient ID: Shane Espinoza, male   DOB: Dec 12, 1940, 81 y.o.   MRN: [id number]        HPI: Shane Espinoza is a 81 y.o. male who presents to the office for a 6 month cardiology evaluation.    Shane Espinoza has established CAD and underwent CABG revascularization surgery by Dr. Nils Pyle on 03/07/2011 after a nuclear perfusion study revealed significant scar/ischemia and cardiac catheterization demonstrated severe multivessel CAD. He had aLIMA to the LAD, vein to the OM, vein to the RV marginal, and vein to the PDA. He  developed PAF postoperatively and underwent TEE guided cardioversion with restoration of sinus rhythm. He completed cardiac rehabilitation. A nuclear perfusion study in August 2013 was markedly improved did not reveal any region of scar or ischemia. He has been off amiodarone and maintaining sinus rhythm. Additional problems include hypertension, hyperlipidemia, and intermittent lower extremity edema.  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  An MRI of his brain showed a tiny left operculum, hemorrhage, infarct, is concerned that potentially this was thrombotic.  He did not have any loss of function of the upper or lower extremities and denied any headache, dizziness, visual symptoms, or difficulty swallowing.  His ECG at that time did suggest sinus rhythm.  He now is on anticoagulation with Xarelto 20 mg. His head CT was negative as was his MRA of the large and medium size vessels.  His carotids were widely patent.  He denies chest pain.  He denies any significant neurologic sequelae.  He is  in permanent atrial fibrillation on chronic anticoagulation with Xarelto.  He has had episodes of recurrent gout.  I had taken him off HCTZ since this could be contributing to elevation of uric acid.  He is now on low-dose chronic alliupurinol and since instituting therapy has not had any recurrent gout episodes.   When I saw him in October 2018 he  denied any episodes of chest pain, palpitations, presyncope or syncope.  He underwent a nuclear stress test on 02/20/2017 which showed an EF of 47% with mild diffuse hypokinesis.  Is no evidence for scar or ischemia.  Because of the slightly reduced EF.  He underwent a echo Doppler study on 03/14/2017.  This showed an estimated EF at 55% without regional wall motion abnormalities.  His left atrium was severely dilated.  Contributed by his atrial fibrillation.  He has had issues with some intermittent leg swelling and was on amlodipine at 2.5 mg, which was reduced because of edema, furosemide 20 mg 5 days per week, irbesartan 300 mg and metoprolol, tartrate 25 mg in the morning and 12.5 mmol at night.  He is on simvastatin for hyperlipidemia and Xarelto for anticoagulation.    I saw him on June 04, 2018 at which time he remained relatively stable from a cardiac standpoint.  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  He does use a recumbent bike at the gym.  He underwent laboratory by Dr. Inda Merlin September 2019 which showed a glucose of 134.  Renal function was normal.  Lipid studies are excellent with a total cholesterol 111 HDL 37 LDL 53 and triglycerides 105.  Creatinine was 0.99.  Hemoglobin was 15.  He has been wearing support stockings 15 to 20 mm which has been helpful for his leg swelling.  He states his blood pressure typically runs in the 140s to 150s.  His pulse typically runs in  the 50s to 60s and occasionally in the 70s.    When I  saw him in December 2020 he was not able to go to the gym due to the COVID-19 pandemic.  His main exercise was walking his dog.  He admits to some weight gain.  He denied anginal symptoms.  He has had follow-up lab work with his primary physician Dr. Inda Merlin and hemoglobin A1c was 6.2.  He wears support stockings most of the time.  He denies bleeding on anticoagulation.  His atrial fibrillation rate  has been  well controlled.  At that evaluation, his blood pressure was stable on amlodipine at a reduced dose of 2.5 mg, metoprolol succinate 25 mg, valsartan 320 mg and spironolactone 12.5 mg.  I saw him in October 2021.  Over the prior year he had done fairly well from a cardiovascular standpoint.    However, he had recently had diarrhea and was diagnosed with C. difficile and E. coli for which he took antibiotics.  He also is uncircumcised and his have been difficulty with scarring.  He has seen Dr. Diona Fanti of [facility] and was told of having phimosis.  He is in need to undergo a modified circumcision and has been on anticoagulation therapy.   Since I saw him, he had fallen and fractured his right ankle in January 2022.  He was hospitalized in February 2022 from February 9 through February 24 with left-sided weakness and numbness and recurrent fall.  He continued to have some weakness in his left hand.  An MRI of his brain showed a focus of restricted diffusion with acute/subacute infarct and periventricular white matter.  He was evaluated by Dr. Leonie Man who felt a stroke was due to small vessel disease.  An echo Doppler study on August 05, 2020 showed an EF of 55 to 60% with grade 1 diastolic dysfunction, mild biatrial enlargement, and aortic valve sclerosis.  He was maintained on Xarelto anticoagulation.  Metformin was added for blood sugar control and he also underwent rehabilitation.  Subsequently, he developed a left leg wound culture positive for Pseudomonas for which she was treated with Cipro and additional antibiotic.  When I saw him on Nov 15, 2020 he denied any chest pain but he was having some ankle swelling.  He was undergoing neuro rehabilitation.  He continued to be on atorvastatin 40 mg for hyperlipidemia.  He has been on furosemide 20 or 40 mg depending upon edema, metoprolol succinate 25 mg daily, metformin and continues to be on Xarelto with his atrial fibrillation anticoagulation.  During  that evaluation, his resting pulse was in the 80s and he had mild blood pressure elevation.  With his leg swelling I recommended he increase furosemide to 40 mg every morning and depending upon residual edema he may need to take an extra 20 mg in the afternoon as needed.  I recommended support stockings and follow-up laboratory.  I saw him in February 25, 2021 and since his prior evaluation with me he had developed some shortness of breath and wheezing on July 6 which was treated with furosemide.  He is followed by Dr. Inda Merlin.  He has also seen Dr. Elenore Rota and a sleep study demonstrated mild sleep apnea.  He is awaiting a CPAP machine.  Presently he is on atorvastatin 40 mg, furosemide 60 mg in the morning, metoprolol succinate 25 mg, spironolactone 25 mg, valsartan 80 mg, and he continues to take Xarelto for anticoagulation.  During that evaluation, he was taking 60 mg  of Lasix in the morning and there was significant improvement with only residual trivial left ankle swelling.  I saw him on June 15, 2021.  At that time he continued to be stable and denied any chest pain or shortness of breath.  He underwent a follow-up echo Doppler study on March 25, 2021 which showed an EF of 55 to 60%, mild biatrial enlargement, mild mitral annular calcification with moderate MR and mild aortic sclerosis without stenosis.  Of note, creatinine in August 2022 was 1.2.  He had had recent laboratory by Dr. Inda Merlin on May 24, 2021 and his creatinine was now 2.01.  Potassium was 4.6.  Hemoglobin A1c was 6.6.  Lipid studies revealed an LDL cholesterol at 62 with triglycerides 134 total cholesterol 130 and HDL 45.  He has been taking Lasix 40 mg in the morning ER and 20 mg in the afternoon, metoprolol succinate 25 mg daily, spironolactone 25 mg daily, valsartan 80 mg daily.  He continues to be on Xarelto.  He is on atorvastatin 40 mg.  During that evaluation, his atrial fibrillation was controlled with a rate in the 70s.   Blood pressure was stable but on the low side.  I recommended he decrease spironolactone down to 12.5 mg and recommended he change his furosemide from 40 mg daily down to 40 mg alternating with 20 mg every other day.  Repeat laboratory was recommended.  I last saw him on Nov 03, 2021.  Since his prior evaluation he had COVID in April 2023 with cough and congestion and received molnupiravir 800 mg every 12 hours for 5 days. He has had issues with hematuria and will need a modified circumcision.  He had undergone CT of his kidney.  Recent creatinine on October 04, 2021 was 1.9.  His primary care doctor, Dr. Inda Merlin is retiring and he will be transitioning to Dr. Koleen Nimrod.  He continues to be in permanent atrial fibrillation.  He denies any chest pain.  He is now on CPAP therapy followed by Dr. Elenore Rota.    Since I last saw him, Dr. Elenore Rota has retired and he now is going to see Dr. Reece Levy for his CPAP.  He continues to be followed by Dr. Carolin Sicks of nephrology.  He denies chest pain or shortness of breath.  He is unaware of palpitations.  There has been some increased stress on the home front with his 82 year old adopted daughter with metastatic breast cancer.  He has been using CPAP and is sleeping well.  He continues to be on furosemide 20 mg, metoprolol succinate 25 mg, spironolactone 12.5 mg, valsartan 80 mg daily.  He is anticoagulated on reduced dose Xarelto 15 mg.  He is on atorvastatin 40 mg for hyperlipidemia.  LDL cholesterol in November 2022 was 62.  He presents for cardiology evaluation.  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   [name]    Past Surgical History:  Procedure Laterality Date   CARDIAC CATHETERIZATION  03/02/2011   Recommended CABG   COLON SURGERY     CORONARY ARTERY BYPASS GRAFT  03/07/2011   DR.VAN  TRIGHT    EYE SURGERY     LEXISCAN MYOVIEW  02/20/2012   EKG negative for ischemia, noraml study   TRANSTHORACIC  ECHOCARDIOGRAM  02/20/2012   EF 50-55%, mild-moderate concentric LVH, LA moderately dilated, moderate mitral regurg   VASCULAR DOPPLER  03/03/2011   Nosignificant extracranial carotid artery stenosis    Allergies  Allergen Reactions   Lisinopril     Other reaction(s): cough    Current Outpatient Medications  Medication Sig Dispense Refill   allopurinol (ZYLOPRIM) 100 MG tablet Take 1 tablet by mouth daily.  2   atorvastatin (LIPITOR) 40 MG tablet Take 1 tablet (40 mg total) by mouth at bedtime. 40 tablet 0   Cholecalciferol (VITAMIN D-3) 5000 UNIT/ML LIQD 1 capsule     furosemide (LASIX) 20 MG tablet TAKE 1 TABLET DAILY, MAY   TAKE AN EXTRA TABLET FOR   SWELLING 180 tablet 3   metoprolol succinate (TOPROL-XL) 25 MG 24 hr tablet TAKE 1 TABLET DAILY 90 tablet 3   spironolactone (ALDACTONE) 25 MG tablet Take 0.5 tablets (12.5 mg total) by mouth daily. 60 tablet 1   valsartan (DIOVAN) 80 MG tablet Take 1 tablet (80 mg total) by mouth daily. 90 tablet 1   vitamin B-12 (CYANOCOBALAMIN) 1000 MCG tablet Take 1,000 mcg by mouth daily.     Vitamin D, Ergocalciferol, (DRISDOL) 1.25 MG (50000 UNIT) CAPS capsule Take 50,000 Units by mouth 3 (three) times a week.     XARELTO 15 MG TABS tablet TAKE 1 TABLET DAILY WITH   SUPPER 90 tablet 1   No current facility-administered medications for this visit.    Social History   Socioeconomic History   Marital status: Married    Spouse name: [name]   Number of children: 2   Years of education: college   Highest education level: Not on file  Occupational History   Occupation: retitred  Tobacco Use   Smoking status: Never   Smokeless tobacco: Never  Vaping Use   Vaping Use: Never used  Substance and Sexual Activity   Alcohol use: No   Drug use: No   Sexual activity: Never  Other Topics Concern   Not on file  Social History Narrative   Lives  at home with wife   Right Handed   Drinks caffeine occassionally   Social Determinants of Health   Financial Resource Strain: Not on file  Food Insecurity: Not on file  Transportation Needs: Not on file  Physical Activity: Not on file  Stress: Not on file  Social Connections: Not on file  Intimate Partner Violence: Not on file    Family History  Problem Relation Age of Onset   CVA Father    Heart attack Maternal Grandmother    Heart attack Maternal Grandfather    Heart murmur Sister    Hypertension Son    Socially he is married has 2 children 4 grandchildren. He now exercises fairly regularly. There is no tobacco or alcohol use.   ROS General: Negative; No fevers, chills, or night sweats;  HEENT: Negative; No changes in vision or hearing, sinus congestion, difficulty swallowing Pulmonary: Negative; No cough, wheezing, shortness of breath, hemoptysis Cardiovascular: see history of present illness Positive for intermittent edema GI: Positive for hemorrhoids and he status post hemorrhoidal banding; recent C. difficile infection GU: Recently diagnosed phimosis in need for modified circumcision Musculoskeletal: Positive for gout; right ankle fracture with oblique fracture of the distal fibula Hematologic/Oncology: Negative; no easy bruising, bleeding Endocrine: Negative; no heat/cold intolerance; no diabetes Neuro: Positive for thrombotic stroke; recent probable stroke due to small vessel disease;no changes in balance, headaches Skin: Negative; No rashes or skin  lesions Psychiatric: Negative; No behavioral problems, depression Sleep: Positive for OSA, now on CPAP therapy unaware of residual snoring, daytime sleepiness, hypersomnolence, bruxism, restless legs, hypnogognic hallucinations, no cataplexy Other comprehensive 14 point system review is negative.   PE BP 130/72   Pulse 71   Ht _0  (1.753 m)   Wt 201 lb (91.2 kg)   SpO2 96%   BMI 29.68 kg/m    Repeat blood  pressure by me 128/68  Wt Readings from Last 3 Encounters:  05/31/22 201 lb (91.2 kg)  11/03/21 211 lb (95.7 kg)  06/15/21 199 lb 6.4 oz (90.4 kg)   General: Alert, oriented, no distress.  10 pound weight loss Skin: normal turgor, no rashes, warm and dry HEENT: Normocephalic, atraumatic. Pupils equal round and reactive to light; sclera anicteric; extraocular muscles intact;  Nose without nasal septal hypertrophy Mouth/Parynx benign; Mallinpatti scale 3 Neck: No JVD, no carotid bruits; normal carotid upstroke Lungs: clear to ausculatation and percussion; no wheezing or rales Chest wall: without tenderness to palpitation Heart: PMI not displaced, irregular irregular with rate control in the 70s., s1 s2 normal, 1/6 systolic murmur, no diastolic murmur, no rubs, gallops, thrills, or heaves Abdomen: soft, nontender; no hepatosplenomehaly, BS+; abdominal aorta nontender and not dilated by palpation. Back: no CVA tenderness Pulses 2+ Musculoskeletal: full range of motion, normal strength, no joint deformities Extremities: no clubbing cyanosis or edema, Homan's sign negative  Neurologic: grossly nonfocal; Cranial nerves grossly wnl Psychologic: Normal mood and affect    May 31, 2022 ECG (independently read by me): Atrial fibrillation at 71, PVC  Nov 03, 2021 ECG (independently read by me): Atrial fibrillation at 65  June 15, 2021 ECG (independently read by me): Atrial fibrillation at 78  February 25, 2021 ECG (independently read by me):  Atrial fibrillation at 68  Nov 15, 2020 ECG (independently read by me): Atrial fibrillation at 83  October 2021 ECG (independently read by me): Atrial fibrillation at 75, PVC  December 2020 ECG (independently read by me): Atrial fibrillation at 81 bpm.  Poor anterior R wave progression.  QTc interval 446 ms  December 2019 ECG (independently read by me): Atrial fibrillation at 76 bpm QTc interval 443 ms.  No ECG done today, but review of ECG at  the time of his nuclear study on 02/20/2017 showed atrial fibrillation at 78 bpm  November 2017 ECG (independently read by me): Atrial fibrillation at 70 bpm.  Poor anterior R-wave progression.  QTc interval 475 ms.  November 2016 ECG (independently read by me): Atrial fibrillation at 67 bpm.  Nonspecific ST changes.  QTc interval normal at 443 ms.  May 2016 ECG (independently read by me): Atrial fibrillation with a controlled ventricular rate in the 60s.  December 2015 ECG (independently read by me): Atrial fibrillation with a ventricular rate at 84.  No significant ST-T changes  June 2015 ECG (independently read by me): Atrial fibrillation with a ventricular rate is 74.  Nonspecific ST changes.  10/09/2013 ECG (independently read by me): Atrial fibrillation with controlled ventricular response at 63 beats per minute.  Prior April 16 2013 ECG: Sinus bradycardia at 52 beats per minute.  LABS:    Latest Ref Rng & Units 08/24/2021   11:48 AM 07/28/2021    3:18 PM 06/29/2021    3:06 PM  BMP  Glucose 70 - 99 mg/dL 119  106  135   BUN 8 - 27 mg/dL 31  30  36   Creatinine 0.76 - 1.27 mg/dL  1.73  2.04  1.78   BUN/Creat Ratio 10 - _0 Sodium 134 - 144 mmol/L 142  144  141   Potassium 3.5 - 5.2 mmol/L 4.8  4.2  4.8   Chloride 96 - 106 mmol/L 105  103  102   CO2 20 - 29 mmol/L _1 Calcium 8.6 - 10.2 mg/dL 9.4  9.5  9.4       Latest Ref Rng & Units 06/29/2021    3:06 PM 02/22/2021   10:28 AM 08/12/2020    5:28 AM  Hepatic Function  Total Protein 6.0 - 8.5 g/dL 6.5  6.9  6.1   Albumin 3.6 - 4.6 g/dL 3.7  4.1  2.9   AST 0 - 40 IU/L 32  34  25   ALT 0 - 44 IU/L _2 Alk Phosphatase 44 - 121 IU/L 139  175  64   Total Bilirubin 0.0 - 1.2 mg/dL 0.7  1.0  1.1       Latest Ref Rng & Units 02/22/2021   10:28 AM 08/23/2020    5:56 AM 08/16/2020    5:47 AM  CBC  WBC 3.4 - 10.8 x10E3/uL 8.2  7.1  8.3   Hemoglobin 13.0 - 17.7 g/dL 16.2  14.9  14.3   Hematocrit  37.5 - 51.0 % 49.4  46.0  43.6   Platelets 150 - 450 x10E3/uL 183  202  179    Lab Results  Component Value Date   MCV 88 02/22/2021   MCV 93.1 08/23/2020   MCV 91.6 08/16/2020   Lab Results  Component Value Date   TSH 2.560 02/22/2021   Lab Results  Component Value Date   HGBA1C 6.5 (H) 08/06/2020   Lipid Panel     Component Value Date/Time   CHOL 130 02/22/2021 1028   TRIG 121 02/22/2021 1028   HDL 43 02/22/2021 1028   CHOLHDL 3.0 02/22/2021 1028   CHOLHDL 2.8 08/06/2020 0604   VLDL 20 08/06/2020 0604   LDLCALC 65 02/22/2021 1028    IMPRESSION:  1. Persistent atrial fibrillation ([place])   2. Coronary artery disease of native artery of native heart with stable angina pectoris ([place])   3. S/P CABG x 4   4. OSA (obstructive sleep apnea)   5. Anticoagulated   6. Stage 3b chronic kidney disease ([place])   7. Hyperlipidemia with target LDL less than 70     ASSESSMENT AND PLAN: Mr. Kinner is an 81 year-old Caucasian male who underwent CABG revascularization surgery for severe multivessel CAD in September 2012.  He suffered a small thrombotic CVA most likely due to atrial fibrillation etiology in March 2015 and had complete resolution of symptoms.  He has permanent atrial fibrillation with a controlled ventricular response for which he is entirely asymptomatic.  He continues to be on Xarelto anticoagulation therapy.His nuclear stress test in August 2018 continued to show normal perfusion without scar or ischemia.  There was some discordance to the EF on the nuclear study when compared to the echo.  The echo revealed an EF of 55% without wall motion abnormality, severe LA dilatation, which undoubtedly is contributed by his atrial fibrillation.  On July 07, 2020 he had fallen and sustained a oblique fracture of the distal fibula.  In August 05, 2020 he was hospitalized with left-sided weakness and numbness after recurrent fall.  He had resolution of left lower extremity  symptoms but  he continued to have weakness in his left hand.  An MRI of the brain showed probable acute subacute infarct in the periventricular white matter and it was felt by Dr. Leonie Man that his stroke was due to small vessel disease.  He has continued to be on Xarelto for his permanent atrial fibrillation and with his age of over 44 years and CKD with creatinine 1.78 he is on reduced dose at 15 mg daily.  Since I last saw him, he has lost approximately 10 pounds.  He is on CPAP therapy for obstructive sleep apnea and had been followed by Dr. Elenore Rota who recently retired and will be followed by Dr. Reece Levy who has taken over his practice.  He is followed by Dr. Carolin Sicks for his CKD stage IIIb.  His blood pressure today is stable and on repeat by me was 128/68.  His previous peripheral edema has improved and he is now on furosemide 20 mg daily and spironolactone 12.5 mg daily in addition to his Toprol-XL 25 mg and valsartan 80 mg.  He continues to be on atorvastatin 40 mg for hyperlipidemia.  LDL cholesterol when last checked was 62.  Since Dr. Josetta Huddle retired he will be seeing Dr. Koleen Nimrod on December 14 for primary care and laboratory will be obtained.  I will see him in 6 months for reevaluation or sooner as needed     Troy Sine, MD, [facility]  06/09/2022 1:25 PM

## 2022-06-05 ENCOUNTER — Encounter (INDEPENDENT_AMBULATORY_CARE_PROVIDER_SITE_OTHER): Payer: Medicare HMO | Admitting: Ophthalmology

## 2022-06-05 DIAGNOSIS — H35343 Macular cyst, hole, or pseudohole, bilateral: Secondary | ICD-10-CM | POA: Diagnosis not present

## 2022-06-05 DIAGNOSIS — G4733 Obstructive sleep apnea (adult) (pediatric): Secondary | ICD-10-CM | POA: Diagnosis not present

## 2022-06-05 DIAGNOSIS — I10 Essential (primary) hypertension: Secondary | ICD-10-CM

## 2022-06-05 DIAGNOSIS — H35372 Puckering of macula, left eye: Secondary | ICD-10-CM | POA: Diagnosis not present

## 2022-06-05 DIAGNOSIS — H43812 Vitreous degeneration, left eye: Secondary | ICD-10-CM | POA: Diagnosis not present

## 2022-06-05 DIAGNOSIS — H35033 Hypertensive retinopathy, bilateral: Secondary | ICD-10-CM

## 2022-06-09 ENCOUNTER — Encounter: Payer: Self-pay | Admitting: Cardiovascular Disease

## 2022-06-15 DIAGNOSIS — I10 Essential (primary) hypertension: Secondary | ICD-10-CM | POA: Diagnosis not present

## 2022-06-15 DIAGNOSIS — G4730 Sleep apnea, unspecified: Secondary | ICD-10-CM | POA: Diagnosis not present

## 2022-06-15 DIAGNOSIS — E559 Vitamin D deficiency, unspecified: Secondary | ICD-10-CM | POA: Diagnosis not present

## 2022-06-15 DIAGNOSIS — R7303 Prediabetes: Secondary | ICD-10-CM | POA: Diagnosis not present

## 2022-06-15 DIAGNOSIS — R609 Edema, unspecified: Secondary | ICD-10-CM | POA: Diagnosis not present

## 2022-06-15 DIAGNOSIS — I5043 Acute on chronic combined systolic (congestive) and diastolic (congestive) heart failure: Secondary | ICD-10-CM | POA: Diagnosis not present

## 2022-06-15 DIAGNOSIS — N471 Phimosis: Secondary | ICD-10-CM | POA: Diagnosis not present

## 2022-06-15 DIAGNOSIS — E782 Mixed hyperlipidemia: Secondary | ICD-10-CM | POA: Diagnosis not present

## 2022-06-15 DIAGNOSIS — R748 Abnormal levels of other serum enzymes: Secondary | ICD-10-CM | POA: Diagnosis not present

## 2022-06-15 DIAGNOSIS — E538 Deficiency of other specified B group vitamins: Secondary | ICD-10-CM | POA: Diagnosis not present

## 2022-06-15 DIAGNOSIS — Z0000 Encounter for general adult medical examination without abnormal findings: Secondary | ICD-10-CM | POA: Diagnosis not present

## 2022-06-15 DIAGNOSIS — I4891 Unspecified atrial fibrillation: Secondary | ICD-10-CM | POA: Diagnosis not present

## 2022-06-15 DIAGNOSIS — Z1331 Encounter for screening for depression: Secondary | ICD-10-CM | POA: Diagnosis not present

## 2022-06-15 DIAGNOSIS — R0609 Other forms of dyspnea: Secondary | ICD-10-CM | POA: Diagnosis not present

## 2022-07-06 DIAGNOSIS — G4733 Obstructive sleep apnea (adult) (pediatric): Secondary | ICD-10-CM | POA: Diagnosis not present

## 2022-07-19 DIAGNOSIS — M79672 Pain in left foot: Secondary | ICD-10-CM | POA: Diagnosis not present

## 2022-07-19 DIAGNOSIS — M205X2 Other deformities of toe(s) (acquired), left foot: Secondary | ICD-10-CM | POA: Diagnosis not present

## 2022-08-06 DIAGNOSIS — G4733 Obstructive sleep apnea (adult) (pediatric): Secondary | ICD-10-CM | POA: Diagnosis not present

## 2022-08-07 DIAGNOSIS — M2042 Other hammer toe(s) (acquired), left foot: Secondary | ICD-10-CM | POA: Diagnosis not present

## 2022-08-07 DIAGNOSIS — L89899 Pressure ulcer of other site, unspecified stage: Secondary | ICD-10-CM | POA: Diagnosis not present

## 2022-08-07 DIAGNOSIS — L84 Corns and callosities: Secondary | ICD-10-CM | POA: Diagnosis not present

## 2022-08-30 DIAGNOSIS — L84 Corns and callosities: Secondary | ICD-10-CM | POA: Diagnosis not present

## 2022-08-30 DIAGNOSIS — L89899 Pressure ulcer of other site, unspecified stage: Secondary | ICD-10-CM | POA: Diagnosis not present

## 2022-08-30 DIAGNOSIS — M2042 Other hammer toe(s) (acquired), left foot: Secondary | ICD-10-CM | POA: Diagnosis not present

## 2022-09-03 DIAGNOSIS — E1165 Type 2 diabetes mellitus with hyperglycemia: Secondary | ICD-10-CM | POA: Diagnosis not present

## 2022-09-03 DIAGNOSIS — D6869 Other thrombophilia: Secondary | ICD-10-CM | POA: Diagnosis not present

## 2022-09-03 DIAGNOSIS — E669 Obesity, unspecified: Secondary | ICD-10-CM | POA: Diagnosis not present

## 2022-09-03 DIAGNOSIS — Z008 Encounter for other general examination: Secondary | ICD-10-CM | POA: Diagnosis not present

## 2022-09-03 DIAGNOSIS — R32 Unspecified urinary incontinence: Secondary | ICD-10-CM | POA: Diagnosis not present

## 2022-09-03 DIAGNOSIS — I2510 Atherosclerotic heart disease of native coronary artery without angina pectoris: Secondary | ICD-10-CM | POA: Diagnosis not present

## 2022-09-03 DIAGNOSIS — I509 Heart failure, unspecified: Secondary | ICD-10-CM | POA: Diagnosis not present

## 2022-09-03 DIAGNOSIS — E785 Hyperlipidemia, unspecified: Secondary | ICD-10-CM | POA: Diagnosis not present

## 2022-09-03 DIAGNOSIS — G4733 Obstructive sleep apnea (adult) (pediatric): Secondary | ICD-10-CM | POA: Diagnosis not present

## 2022-09-03 DIAGNOSIS — N529 Male erectile dysfunction, unspecified: Secondary | ICD-10-CM | POA: Diagnosis not present

## 2022-09-03 DIAGNOSIS — I4891 Unspecified atrial fibrillation: Secondary | ICD-10-CM | POA: Diagnosis not present

## 2022-09-03 DIAGNOSIS — I252 Old myocardial infarction: Secondary | ICD-10-CM | POA: Diagnosis not present

## 2022-09-03 DIAGNOSIS — R2681 Unsteadiness on feet: Secondary | ICD-10-CM | POA: Diagnosis not present

## 2022-09-25 DIAGNOSIS — L89899 Pressure ulcer of other site, unspecified stage: Secondary | ICD-10-CM | POA: Diagnosis not present

## 2022-09-25 DIAGNOSIS — M2042 Other hammer toe(s) (acquired), left foot: Secondary | ICD-10-CM | POA: Diagnosis not present

## 2022-09-25 DIAGNOSIS — L84 Corns and callosities: Secondary | ICD-10-CM | POA: Diagnosis not present

## 2022-10-04 DIAGNOSIS — G4733 Obstructive sleep apnea (adult) (pediatric): Secondary | ICD-10-CM | POA: Diagnosis not present

## 2022-10-11 DIAGNOSIS — E559 Vitamin D deficiency, unspecified: Secondary | ICD-10-CM | POA: Diagnosis not present

## 2022-10-11 DIAGNOSIS — I129 Hypertensive chronic kidney disease with stage 1 through stage 4 chronic kidney disease, or unspecified chronic kidney disease: Secondary | ICD-10-CM | POA: Diagnosis not present

## 2022-10-11 DIAGNOSIS — I509 Heart failure, unspecified: Secondary | ICD-10-CM | POA: Diagnosis not present

## 2022-10-11 DIAGNOSIS — N1832 Chronic kidney disease, stage 3b: Secondary | ICD-10-CM | POA: Diagnosis not present

## 2022-10-11 DIAGNOSIS — N320 Bladder-neck obstruction: Secondary | ICD-10-CM | POA: Diagnosis not present

## 2022-10-31 ENCOUNTER — Other Ambulatory Visit: Payer: Self-pay | Admitting: Cardiovascular Disease

## 2022-10-31 NOTE — Telephone Encounter (Signed)
Pt last saw Dr Tresa Endo 05/31/22, last labs 04/04/22 Creat 1.78 at [facility] per [facility], age 82, weight 91.2kg, CrCl 40.56, based on CrCl pt is on appropriate dosage of Xarelto 15mg  QD for afib.  Will refill rx.

## 2022-11-03 ENCOUNTER — Other Ambulatory Visit: Payer: Self-pay | Admitting: *Deleted

## 2022-11-03 DIAGNOSIS — G4733 Obstructive sleep apnea (adult) (pediatric): Secondary | ICD-10-CM | POA: Diagnosis not present

## 2022-11-03 DIAGNOSIS — M79606 Pain in leg, unspecified: Secondary | ICD-10-CM

## 2022-11-09 ENCOUNTER — Encounter: Payer: Self-pay | Admitting: Vascular Surgery

## 2022-11-09 ENCOUNTER — Ambulatory Visit: Payer: Medicare HMO | Admitting: Vascular Surgery

## 2022-11-09 ENCOUNTER — Ambulatory Visit (HOSPITAL_COMMUNITY)
Admission: RE | Admit: 2022-11-09 | Discharge: 2022-11-09 | Disposition: A | Discharge status: Home / Self Care | Payer: Medicare HMO | Source: Ambulatory Visit | Attending: Vascular Surgery | Admitting: Vascular Surgery

## 2022-11-09 VITALS — BP 133/80 | HR 63 | Temp 98.1°F | Resp 20 | Ht 69.0 in | Wt 215.0 lb

## 2022-11-09 DIAGNOSIS — M79606 Pain in leg, unspecified: Secondary | ICD-10-CM

## 2022-11-09 DIAGNOSIS — I739 Peripheral vascular disease, unspecified: Secondary | ICD-10-CM

## 2022-11-09 LAB — VAS US ABI WITH/WO TBI

## 2022-11-09 NOTE — Progress Notes (Signed)
ASSESSMENT & PLAN  SCREENING FOR PERIPHERAL ARTERIAL DISEASE:  Although his arteries are calcified and ABIs cannot be obtained, he has normal Doppler signals in both feet and normal toe pressures.  In addition he has palpable dorsalis pedis pulses.  I reassured him that he has no evidence of significant arterial insufficiency.  He does have some evidence of venous insufficiency given his hyperpigmentation but has minimal symptoms.  I encouraged him to stay as active as possible.  Fortunately he is not a smoker.  We will be happy to see him back as needed.  REASON FOR CONSULT:    To evaluate for peripheral chill disease.  The consult is requested by Dr. Eleanora Neighbor.  HPI:   Shane Espinoza is a 82 y.o. male who had a home screening test for peripheral arterial disease.  This suggested significant peripheral arterial disease and the patient was sent for vascular consultation.  On my history, he denies any history of claudication, rest pain, or nonhealing ulcers.  He is not a smoker.  His risk factors for peripheral arterial disease include diabetes and hypertension.  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  2   atorvastatin (LIPITOR) 40 MG tablet Take 1  tablet (40 mg total) by mouth at bedtime. 40 tablet 0   Cholecalciferol (VITAMIN D-3) 5000 UNIT/ML LIQD 1 capsule     furosemide (LASIX) 20 MG tablet TAKE 1 TABLET DAILY, MAY   TAKE AN EXTRA TABLET FOR   SWELLING 180 tablet 3   metoprolol succinate (TOPROL-XL) 25 MG 24 hr tablet TAKE 1 TABLET DAILY 90 tablet 3   spironolactone (ALDACTONE) 25 MG tablet Take 0.5 tablets (12.5 mg total) by mouth daily. 60 tablet 1   valsartan (DIOVAN) 80 MG tablet Take 1 tablet (80 mg total) by mouth daily. 90 tablet 1   vitamin B-12 (CYANOCOBALAMIN) 1000 MCG tablet Take 1,000 mcg by mouth daily.     Vitamin D, Ergocalciferol, (DRISDOL) 1.25 MG (50000 UNIT) CAPS capsule Take 50,000 Units by mouth 3 (three) times a week.     XARELTO 15 MG TABS tablet TAKE 1 TABLET DAILY WITH   SUPPER 90 tablet 1   No current facility-administered medications for this visit.    REVIEW OF SYSTEMS:  [X]  denotes positive finding, [ ]  denotes negative finding Cardiac  Comments:  Chest pain or chest pressure:    Shortness of breath upon exertion:    Short of breath when lying flat:    Irregular heart rhythm: x       Vascular  Pain in calf, thigh, or hip brought on by ambulation:    Pain in feet at night that wakes you up from your sleep:     Blood clot in your veins:    Leg swelling:         Pulmonary    Oxygen at home:    Productive cough:     Wheezing:         Neurologic    Sudden weakness in arms or legs:     Sudden numbness in arms or legs:     Sudden onset of difficulty speaking or slurred speech:    Temporary loss of vision in one eye:     Problems with dizziness:         Gastrointestinal    Blood in stool:     Vomited blood:         Genitourinary    Burning when urinating:     Blood in urine:        Psychiatric    Major depression:         Hematologic    Bleeding problems:    Problems with blood clotting too easily:        Skin    Rashes or ulcers:        Constitutional    Fever or chills:     -  PHYSICAL EXAM:   Vitals:   11/09/22 1437  BP: 133/80  Pulse: 63  Resp: 20  Temp: 98.1 F (36.7 C)  SpO2: 96%  Weight: 215 lb (97.5 kg)  Height: 5\' 9"  (1.753 m)   Body mass index is 31.75 kg/m. GENERAL: The patient is a well-nourished male, in no acute distress. The vital signs are documented above. CARDIAC: There is a regular rate and rhythm.  VASCULAR: I do not detect carotid bruits. He has palpable femoral pulses and palpable dorsalis pedis pulses bilaterally. He has some mild hyperpigmentation consistent with chronic venous insufficiency. PULMONARY: There is good air exchange bilaterally without wheezing or rales. ABDOMEN: Soft and non-tender with normal pitched bowel sounds.  MUSCULOSKELETAL: There are no major deformities. NEUROLOGIC: No focal weakness or paresthesias are detected. SKIN: There are no ulcers or rashes noted. PSYCHIATRIC: The patient has a normal affect.  DATA:    ARTERIAL DOPPLER STUDY: I have independently interpreted his arterial Doppler study today.  On the right side there is a triphasic posterior tibial and dorsalis pedis signal.  ABI could not be obtained as the arteries are noncompressible.  The toe pressure was 138 mmHg.  On the left side there was a triphasic dorsalis pedis and posterior tibial signal.  The arteries were calcified and not compressible.  The toe pressure was 125 mmHg.  Waverly [facility]

## 2022-11-13 DIAGNOSIS — R3912 Poor urinary stream: Secondary | ICD-10-CM | POA: Diagnosis not present

## 2022-11-13 DIAGNOSIS — R3915 Urgency of urination: Secondary | ICD-10-CM | POA: Diagnosis not present

## 2022-11-13 DIAGNOSIS — N471 Phimosis: Secondary | ICD-10-CM | POA: Diagnosis not present

## 2022-11-21 ENCOUNTER — Encounter: Payer: Self-pay | Admitting: Cardiovascular Disease

## 2022-11-21 ENCOUNTER — Ambulatory Visit: Payer: Medicare HMO | Attending: Cardiovascular Disease | Admitting: Cardiovascular Disease

## 2022-11-21 VITALS — BP 126/84 | HR 63 | Ht 68.0 in | Wt 196.0 lb

## 2022-11-21 DIAGNOSIS — I2510 Atherosclerotic heart disease of native coronary artery without angina pectoris: Secondary | ICD-10-CM | POA: Diagnosis not present

## 2022-11-21 DIAGNOSIS — I10 Essential (primary) hypertension: Secondary | ICD-10-CM

## 2022-11-21 DIAGNOSIS — I4811 Longstanding persistent atrial fibrillation: Secondary | ICD-10-CM | POA: Diagnosis not present

## 2022-11-21 DIAGNOSIS — G4733 Obstructive sleep apnea (adult) (pediatric): Secondary | ICD-10-CM | POA: Diagnosis not present

## 2022-11-21 DIAGNOSIS — I4819 Other persistent atrial fibrillation: Secondary | ICD-10-CM | POA: Diagnosis not present

## 2022-11-21 DIAGNOSIS — D6869 Other thrombophilia: Secondary | ICD-10-CM

## 2022-11-21 DIAGNOSIS — Z951 Presence of aortocoronary bypass graft: Secondary | ICD-10-CM

## 2022-11-21 DIAGNOSIS — E785 Hyperlipidemia, unspecified: Secondary | ICD-10-CM

## 2022-11-21 DIAGNOSIS — N1832 Chronic kidney disease, stage 3b: Secondary | ICD-10-CM | POA: Diagnosis not present

## 2022-11-21 NOTE — Patient Instructions (Signed)
Medication Instructions:  Your physician recommends that you continue on your current medications as directed. Please refer to the Current Medication list given to you today.  *If you need a refill on your cardiac medications before your next appointment, please call your pharmacy*   Lab Work: None   Testing/Procedures: None   Follow-Up: At [facility], you and your health needs are our priority.  As part of our continuing mission to provide you with exceptional heart care, we have created designated Provider Care Teams.  These Care Teams include your primary Cardiologist (physician) and Advanced Practice Providers (APPs -  Physician Assistants and Nurse Practitioners) who all work together to provide you with the care you need, when you need it.  Your next appointment:    Feb-March  Provider:   Nicki Guadalajara, MD

## 2022-11-21 NOTE — Progress Notes (Signed)
Patient ID: Shane Espinoza, male   DOB: Mar 13, 1941, 82 y.o.   MRN: [id number]        HPI: Shane Espinoza is a 82 y.o. male who presents to the office for a 6 month cardiology evaluation.    Shane Espinoza has established CAD and underwent CABG revascularization surgery by Dr. Morton Peters on 03/07/2011 after a nuclear perfusion study revealed significant scar/ischemia and cardiac catheterization demonstrated severe multivessel CAD. He had aLIMA to the LAD, vein to the OM, vein to the RV marginal, and vein to the PDA. He  developed PAF postoperatively and underwent TEE guided cardioversion with restoration of sinus rhythm. He completed cardiac rehabilitation. A nuclear perfusion study in August 2013 was markedly improved did not reveal any region of scar or ischemia. He has been off amiodarone and maintaining sinus rhythm. Additional problems include hypertension, hyperlipidemia, and intermittent lower extremity edema.  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  An MRI of his brain showed a tiny left operculum, hemorrhage, infarct, is concerned that potentially this was thrombotic.  He did not have any loss of function of the upper or lower extremities and denied any headache, dizziness, visual symptoms, or difficulty swallowing.  His ECG at that time did suggest sinus rhythm.  He now is on anticoagulation with Xarelto 20 mg. His head CT was negative as was his MRA of the large and medium size vessels.  His carotids were widely patent.  He denies chest pain.  He denies any significant neurologic sequelae.  He is  in permanent atrial fibrillation on chronic anticoagulation with Xarelto.  He has had episodes of recurrent gout.  I had taken him off HCTZ since this could be contributing to elevation of uric acid.  He is now on low-dose chronic alliupurinol and since instituting therapy has not had any recurrent gout episodes.   When I saw him in October 2018 he  denied any episodes of chest pain, palpitations, presyncope or syncope.  He underwent a nuclear stress test on 02/20/2017 which showed an EF of 47% with mild diffuse hypokinesis.  Is no evidence for scar or ischemia.  Because of the slightly reduced EF.  He underwent a echo Doppler study on 03/14/2017.  This showed an estimated EF at 55% without regional wall motion abnormalities.  His left atrium was severely dilated.  Contributed by his atrial fibrillation.  He has had issues with some intermittent leg swelling and was on amlodipine at 2.5 mg, which was reduced because of edema, furosemide 20 mg 5 days per week, irbesartan 300 mg and metoprolol, tartrate 25 mg in the morning and 12.5 mmol at night.  He is on simvastatin for hyperlipidemia and Xarelto for anticoagulation.    I saw him on June 04, 2018 at which time he remained relatively stable from a cardiac standpoint.  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  He does use a recumbent bike at the gym.  He underwent laboratory by Dr. Kevan Ny September 2019 which showed a glucose of 134.  Renal function was normal.  Lipid studies are excellent with a total cholesterol 111 HDL 37 LDL 53 and triglycerides 105.  Creatinine was 0.99.  Hemoglobin was 15.  He has been wearing support stockings 15 to 20 mm which has been helpful for his leg swelling.  He states his blood pressure typically runs in the 140s to 150s.  His pulse typically runs in  the 50s to 60s and occasionally in the 70s.    When I  saw him in December 2020 he was not able to go to the gym due to the COVID-19 pandemic.  His main exercise was walking his dog.  He admits to some weight gain.  He denied anginal symptoms.  He has had follow-up lab work with his primary physician Dr. Kevan Ny and hemoglobin A1c was 6.2.  He wears support stockings most of the time.  He denies bleeding on anticoagulation.  His atrial fibrillation rate  has been  well controlled.  At that evaluation, his blood pressure was stable on amlodipine at a reduced dose of 2.5 mg, metoprolol succinate 25 mg, valsartan 320 mg and spironolactone 12.5 mg.  I saw him in October 2021.  Over the prior year he had done fairly well from a cardiovascular standpoint.    However, he had recently had diarrhea and was diagnosed with C. difficile and E. coli for which he took antibiotics.  He also is uncircumcised and his have been difficulty with scarring.  He has seen Dr. Retta Diones of [facility] and was told of having phimosis.  He is in need to undergo a modified circumcision and has been on anticoagulation therapy.   Since I saw him, he had fallen and fractured his right ankle in January 2022.  He was hospitalized in February 2022 from February 9 through February 24 with left-sided weakness and numbness and recurrent fall.  He continued to have some weakness in his left hand.  An MRI of his brain showed a focus of restricted diffusion with acute/subacute infarct and periventricular white matter.  He was evaluated by Dr. Pearlean Brownie who felt a stroke was due to small vessel disease.  An echo Doppler study on August 05, 2020 showed an EF of 55 to 60% with grade 1 diastolic dysfunction, mild biatrial enlargement, and aortic valve sclerosis.  He was maintained on Xarelto anticoagulation.  Metformin was added for blood sugar control and he also underwent rehabilitation.  Subsequently, he developed a left leg wound culture positive for Pseudomonas for which she was treated with Cipro and additional antibiotic.  When I saw him on Nov 15, 2020 he denied any chest pain but he was having some ankle swelling.  He was undergoing neuro rehabilitation.  He continued to be on atorvastatin 40 mg for hyperlipidemia.  He has been on furosemide 20 or 40 mg depending upon edema, metoprolol succinate 25 mg daily, metformin and continues to be on Xarelto with his atrial fibrillation anticoagulation.  During  that evaluation, his resting pulse was in the 80s and he had mild blood pressure elevation.  With his leg swelling I recommended he increase furosemide to 40 mg every morning and depending upon residual edema he may need to take an extra 20 mg in the afternoon as needed.  I recommended support stockings and follow-up laboratory.  I saw him in February 25, 2021 and since his prior evaluation with me he had developed some shortness of breath and wheezing on July 6 which was treated with furosemide.  He is followed by Dr. Kevan Ny.  He has also seen Dr. Allie Dimmer and a sleep study demonstrated mild sleep apnea.  He is awaiting a CPAP machine.  Presently he is on atorvastatin 40 mg, furosemide 60 mg in the morning, metoprolol succinate 25 mg, spironolactone 25 mg, valsartan 80 mg, and he continues to take Xarelto for anticoagulation.  During that evaluation, he was taking 60 mg  of Lasix in the morning and there was significant improvement with only residual trivial left ankle swelling.  I saw him on June 15, 2021.  At that time he continued to be stable and denied any chest pain or shortness of breath.  He underwent a follow-up echo Doppler study on March 25, 2021 which showed an EF of 55 to 60%, mild biatrial enlargement, mild mitral annular calcification with moderate MR and mild aortic sclerosis without stenosis.  Of note, creatinine in August 2022 was 1.2.  He had had recent laboratory by Dr. Kevan Ny on May 24, 2021 and his creatinine was now 2.01.  Potassium was 4.6.  Hemoglobin A1c was 6.6.  Lipid studies revealed an LDL cholesterol at 62 with triglycerides 134 total cholesterol 130 and HDL 45.  He has been taking Lasix 40 mg in the morning ER and 20 mg in the afternoon, metoprolol succinate 25 mg daily, spironolactone 25 mg daily, valsartan 80 mg daily.  He continues to be on Xarelto.  He is on atorvastatin 40 mg.  During that evaluation, his atrial fibrillation was controlled with a rate in the 70s.   Blood pressure was stable but on the low side.  I recommended he decrease spironolactone down to 12.5 mg and recommended he change his furosemide from 40 mg daily down to 40 mg alternating with 20 mg every other day.  Repeat laboratory was recommended.  I saw him on Nov 03, 2021.  Since his prior evaluation he had COVID in April 2023 with cough and congestion and received molnupiravir 800 mg every 12 hours for 5 days. He has had issues with hematuria and will need a modified circumcision.  He had undergone CT of his kidney.  Recent creatinine on October 04, 2021 was 1.9.  His primary care doctor, Dr. Kevan Ny is retiring and he will be transitioning to Dr. Orson Aloe.  He continues to be in permanent atrial fibrillation.  He denies any chest pain.  He is now on CPAP therapy followed by Dr. Allie Dimmer.    I last saw him on May 31, 2022.  Since his prior evaluation with me Dr. Allie Dimmer has retired and he now is going to see Dr. Betti Cruz for his CPAP.  He continues to be followed by Dr. Ronalee Belts of nephrology.  He denies chest pain or shortness of breath.  He is unaware of palpitations.  There has been some increased stress on the home front with his 83 year old adopted daughter with metastatic breast cancer.  He has been using CPAP and is sleeping well.  He continues to be on furosemide 20 mg, metoprolol succinate 25 mg, spironolactone 12.5 mg, valsartan 80 mg daily.  He is anticoagulated on reduced dose Xarelto 15 mg.  He is on atorvastatin 40 mg for hyperlipidemia.  LDL cholesterol in November 2022 was 62.    Since I last saw him, he has now establish primary care with Dr. Orson Aloe since Dr. Kevan Ny has retired.  He underwent laboratory on June 15, 2022.  Hemoglobin 14.3 hematocrit 44.2.  Platelets 182,000.  B12 892.  Total cholesterol 117, triglycerides 120, HDL 40, LDL 55.  TSH was 2.43.  He continues to be in permanent atrial fibrillation and is on reduced dose Xarelto at 15 mg daily with his stage IIIb CKD and GFR  at 38.  He denies chest pain or shortness of breath.  He does have lower extremity venous insufficiency.  He was recently evaluated by Dr. Jaynie Collins and had lower extremity Doppler study.  He has bilateral noncompressible right and left lower extremity arteries.  ABIs were normal.  He presents for evaluation.   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   [name]    Past Surgical History:  Procedure Laterality Date   CARDIAC CATHETERIZATION  03/02/2011   Recommended CABG   COLON SURGERY     CORONARY ARTERY BYPASS GRAFT  03/07/2011   DR.VAN  TRIGHT   EYE SURGERY     LEXISCAN MYOVIEW  02/20/2012   EKG negative for ischemia, noraml study   TRANSTHORACIC ECHOCARDIOGRAM  02/20/2012   EF 50-55%, mild-moderate concentric LVH, LA moderately dilated, moderate mitral regurg   VASCULAR DOPPLER  03/03/2011   Nosignificant extracranial carotid artery stenosis    Allergies  Allergen Reactions   Lisinopril     Other reaction(s): cough    Current Outpatient Medications  Medication Sig Dispense Refill   allopurinol (ZYLOPRIM) 100 MG tablet Take 1 tablet by mouth daily.  2   atorvastatin (LIPITOR) 40 MG tablet Take 1 tablet (40 mg total) by mouth at bedtime. 40 tablet 0   Cholecalciferol (VITAMIN D-3) 5000 UNIT/ML LIQD 1 capsule     furosemide (LASIX) 20 MG tablet TAKE 1 TABLET DAILY, MAY   TAKE AN EXTRA TABLET FOR   SWELLING 180 tablet 3   metoprolol succinate (TOPROL-XL) 25 MG 24 hr tablet TAKE 1 TABLET DAILY 90 tablet 3   spironolactone (ALDACTONE) 25 MG tablet Take 0.5 tablets (12.5 mg total) by mouth daily. 60 tablet 1   valsartan (DIOVAN) 80 MG tablet Take 1 tablet (80 mg total) by mouth daily. 90 tablet 1   vitamin B-12 (CYANOCOBALAMIN) 1000 MCG tablet Take 1,000 mcg by mouth daily.     Vitamin D, Ergocalciferol,  (DRISDOL) 1.25 MG (50000 UNIT) CAPS capsule Take 50,000 Units by mouth 3 (three) times a week.     XARELTO 15 MG TABS tablet TAKE 1 TABLET DAILY WITH   SUPPER 90 tablet 1   No current facility-administered medications for this visit.    Social History   Socioeconomic History   Marital status: Married    Spouse name: [name]   Number of children: 2   Years of education: college   Highest education level: Not on file  Occupational History   Occupation: retitred  Tobacco Use   Smoking status: Never   Smokeless tobacco: Never  Vaping Use   Vaping Use: Never used  Substance and Sexual Activity   Alcohol use: No   Drug use: No   Sexual activity: Never  Other Topics Concern   Not on file  Social History Narrative   Lives at home with wife   Right Handed   Drinks caffeine occassionally   Social Determinants of Health   Financial Resource Strain: Not on file  Food Insecurity: Not on file  Transportation Needs: Not on file  Physical Activity: Not on file  Stress: Not on file  Social Connections: Not on file  Intimate Partner Violence: Not on file    Family History  Problem Relation Age of Onset   CVA Father    Heart attack Maternal Grandmother    Heart attack Maternal Grandfather    Heart murmur Sister    Hypertension Son  Socially he is married has 2 children 4 grandchildren. He now exercises fairly regularly. There is no tobacco or alcohol use.   ROS General: Negative; No fevers, chills, or night sweats;  HEENT: Negative; No changes in vision or hearing, sinus congestion, difficulty swallowing Pulmonary: Negative; No cough, wheezing, shortness of breath, hemoptysis Cardiovascular: see history of present illness Positive for intermittent edema GI: Positive for hemorrhoids and he status post hemorrhoidal banding; recent C. difficile infection GU: Recently diagnosed phimosis in need for modified circumcision Musculoskeletal: Positive for gout; right ankle fracture  with oblique fracture of the distal fibula Hematologic/Oncology: Negative; no easy bruising, bleeding Endocrine: Negative; no heat/cold intolerance; no diabetes Neuro: Positive for thrombotic stroke; recent probable stroke due to small vessel disease;no changes in balance, headaches Skin: Negative; No rashes or skin lesions Psychiatric: Negative; No behavioral problems, depression Sleep: Positive for OSA, now on CPAP therapy unaware of residual snoring, daytime sleepiness, hypersomnolence, bruxism, restless legs, hypnogognic hallucinations, no cataplexy Other comprehensive 14 point system review is negative.   PE BP 126/84   Pulse 63   Ht 5\' 8"  (1.727 m)   Wt 196 lb (88.9 kg)   SpO2 96%   BMI 29.80 kg/m    Repeat blood pressure by me 126/80  Wt Readings from Last 3 Encounters:  11/21/22 196 lb (88.9 kg)  11/09/22 215 lb (97.5 kg)  05/31/22 201 lb (91.2 kg)   General: Alert, oriented, no distress.  Skin: normal turgor, no rashes, warm and dry HEENT: Normocephalic, atraumatic. Pupils equal round and reactive to light; sclera anicteric; extraocular muscles intact;  Nose without nasal septal hypertrophy Mouth/Parynx benign; Mallinpatti scale 3 Neck: No JVD, no carotid bruits; normal carotid upstroke Lungs: clear to ausculatation and percussion; no wheezing or rales Chest wall: without tenderness to palpitation Heart: PMI not displaced, RRR, s1 s2 normal, 1/6 systolic murmur, no diastolic murmur, no rubs, gallops, thrills, or heaves Abdomen: soft, nontender; no hepatosplenomehaly, BS+; abdominal aorta nontender and not dilated by palpation. Back: no CVA tenderness Pulses 2+ Musculoskeletal: full range of motion, normal strength, no joint deformities Extremities: Mild venous insufficiency; no clubbing cyanosis or edema, Homan's sign negative  Neurologic: grossly nonfocal; Cranial nerves grossly wnl Psychologic: Normal mood and affect    Nov 21, 2022 ECG (independently read by  me):  Atrial fibrillation at 63   May 31, 2022 ECG (independently read by me): Atrial fibrillation at 71, PVC  Nov 03, 2021 ECG (independently read by me): Atrial fibrillation at 65  June 15, 2021 ECG (independently read by me): Atrial fibrillation at 78  February 25, 2021 ECG (independently read by me):  Atrial fibrillation at 68  Nov 15, 2020 ECG (independently read by me): Atrial fibrillation at 83  October 2021 ECG (independently read by me): Atrial fibrillation at 75, PVC  December 2020 ECG (independently read by me): Atrial fibrillation at 81 bpm.  Poor anterior R wave progression.  QTc interval 446 ms  December 2019 ECG (independently read by me): Atrial fibrillation at 76 bpm QTc interval 443 ms.  No ECG done today, but review of ECG at the time of his nuclear study on 02/20/2017 showed atrial fibrillation at 78 bpm  November 2017 ECG (independently read by me): Atrial fibrillation at 70 bpm.  Poor anterior R-wave progression.  QTc interval 475 ms.  November 2016 ECG (independently read by me): Atrial fibrillation at 67 bpm.  Nonspecific ST changes.  QTc interval normal at 443 ms.  May 2016 ECG (independently read by  me): Atrial fibrillation with a controlled ventricular rate in the 60s.  December 2015 ECG (independently read by me): Atrial fibrillation with a ventricular rate at 84.  No significant ST-T changes  June 2015 ECG (independently read by me): Atrial fibrillation with a ventricular rate is 74.  Nonspecific ST changes.  10/09/2013 ECG (independently read by me): Atrial fibrillation with controlled ventricular response at 63 beats per minute.  Prior April 16 2013 ECG: Sinus bradycardia at 52 beats per minute.  LABS:    Latest Ref Rng & Units 08/24/2021   11:48 AM 07/28/2021    3:18 PM 06/29/2021    3:06 PM  BMP  Glucose 70 - 99 mg/dL 161  096  045   BUN 8 - 27 mg/dL 31  30  36   Creatinine 0.76 - 1.27 mg/dL 4.09  8.11  9.14   BUN/Creat Ratio 10 - 24  18  15  20    Sodium 134 - 144 mmol/L 142  144  141   Potassium 3.5 - 5.2 mmol/L 4.8  4.2  4.8   Chloride 96 - 106 mmol/L 105  103  102   CO2 20 - 29 mmol/L 23  24  24    Calcium 8.6 - 10.2 mg/dL 9.4  9.5  9.4       Latest Ref Rng & Units 06/29/2021    3:06 PM 02/22/2021   10:28 AM 08/12/2020    5:28 AM  Hepatic Function  Total Protein 6.0 - 8.5 g/dL 6.5  6.9  6.1   Albumin 3.6 - 4.6 g/dL 3.7  4.1  2.9   AST 0 - 40 IU/L 32  34  25   ALT 0 - 44 IU/L 29  30  20    Alk Phosphatase 44 - 121 IU/L 139  175  64   Total Bilirubin 0.0 - 1.2 mg/dL 0.7  1.0  1.1       Latest Ref Rng & Units 02/22/2021   10:28 AM 08/23/2020    5:56 AM 08/16/2020    5:47 AM  CBC  WBC 3.4 - 10.8 x10E3/uL 8.2  7.1  8.3   Hemoglobin 13.0 - 17.7 g/dL 78.2  95.6  21.3   Hematocrit 37.5 - 51.0 % 49.4  46.0  43.6   Platelets 150 - 450 x10E3/uL 183  202  179    Lab Results  Component Value Date   MCV 88 02/22/2021   MCV 93.1 08/23/2020   MCV 91.6 08/16/2020   Lab Results  Component Value Date   TSH 2.560 02/22/2021   Lab Results  Component Value Date   HGBA1C 6.5 (H) 08/06/2020   Lipid Panel     Component Value Date/Time   CHOL 130 02/22/2021 1028   TRIG 121 02/22/2021 1028   HDL 43 02/22/2021 1028   CHOLHDL 3.0 02/22/2021 1028   CHOLHDL 2.8 08/06/2020 0604   VLDL 20 08/06/2020 0604   LDLCALC 65 02/22/2021 1028    IMPRESSION:  1. Essential hypertension   2. CAD in native artery   3. S/P CABG x 4   4. OSA (obstructive sleep apnea)   5. Persistent atrial fibrillation (HCC)   6. Hypercoagulable state due to longstanding persistent atrial fibrillation (HCC)   7. Stage 3b chronic kidney disease (HCC)   8. Hyperlipidemia with target LDL less than 70     ASSESSMENT AND PLAN: Mr. Semrad is an 82 year-old Caucasian male who underwent CABG revascularization surgery for severe multivessel CAD in September 2012.  He suffered a small thrombotic CVA most likely due to atrial fibrillation etiology in  March 2015 and had complete resolution of symptoms.  He has permanent atrial fibrillation with a controlled ventricular response for which he is entirely asymptomatic.  He continues to be on Xarelto anticoagulation therapy, currently at 15 mg daily with his GFR at 38.  A nuclear stress test in August 2018 continued to show normal perfusion without scar or ischemia.  There was some discordance to the EF on the nuclear study when compared to the echo.  The echo revealed an EF of 55% without wall motion abnormality, severe LA dilatation, which undoubtedly is contributed by his atrial fibrillation.  On July 07, 2020 he had fallen and sustained a oblique fracture of the distal fibula.  In August 05, 2020 he was hospitalized with left-sided weakness and numbness after recurrent fall.  He had resolution of left lower extremity symptoms but he continued to have weakness in his left hand.  An MRI of the brain showed probable acute subacute infarct in the periventricular white matter and it was felt by Dr. Pearlean Brownie that his stroke was due to small vessel disease.  He has continued to be on Xarelto for his permanent atrial fibrillation and with his age of over 79 years and CKD with creatinine 1.78 he is on reduced dose at 15 mg daily.    He has been successful with weight loss and BMI to day is 29.8.  His blood pressure today is stable and on repeat by me was 126/80 on furosemide 20 mg, metoprolol succinate 25 mg, spironolactone 12.5 mg and valsartan 80 mg daily.  He continues to be on atorvastatin 40 mg.  Lipid studies from December were excellent with LDL cholesterol at 55, total cholesterol 117 and triglycerides 120.  He continues to use CPAP therapy which is now followed by Dr. Betti Cruz since Dr. Allie Dimmer has retired.  He also has been seeing Dr. Retta Diones of urology who is retiring and he will be seeing one of his new partners.  I will see him in 8 to 10 months for follow-up evaluation or sooner as needed.     Lennette Bihari, MD, [facility]  11/28/2022 11:59 AM

## 2022-11-28 ENCOUNTER — Encounter: Payer: Self-pay | Admitting: Cardiovascular Disease

## 2022-12-04 DIAGNOSIS — G4733 Obstructive sleep apnea (adult) (pediatric): Secondary | ICD-10-CM | POA: Diagnosis not present

## 2022-12-05 DIAGNOSIS — Z85828 Personal history of other malignant neoplasm of skin: Secondary | ICD-10-CM | POA: Diagnosis not present

## 2022-12-05 DIAGNOSIS — L820 Inflamed seborrheic keratosis: Secondary | ICD-10-CM | POA: Diagnosis not present

## 2022-12-05 DIAGNOSIS — L570 Actinic keratosis: Secondary | ICD-10-CM | POA: Diagnosis not present

## 2022-12-05 DIAGNOSIS — D692 Other nonthrombocytopenic purpura: Secondary | ICD-10-CM | POA: Diagnosis not present

## 2022-12-05 DIAGNOSIS — D485 Neoplasm of uncertain behavior of skin: Secondary | ICD-10-CM | POA: Diagnosis not present

## 2022-12-10 IMAGING — CT CT HEAD W/O CM
4 series · 15 of 47 positions shown, 17 images · non-contrast
Comparison: 09/23/2013

CLINICAL DATA: Transient ischemic attack, episode of LEFT-sided
weakness and dizziness today lasting 1 hour, symptoms resolved

EXAM:
CT HEAD WITHOUT CONTRAST
TECHNIQUE: Contiguous axial images were obtained from the base of the skull
through the vertex without intravenous contrast. Sagittal and
coronal MPR images reconstructed from axial data set.

[Series 3: head without · axial · non-contrast · 0.49mm/px · z∈[+1504,+1624]mm · 7 of 32 slices shown, 9 images]
[im 4/32  brain]
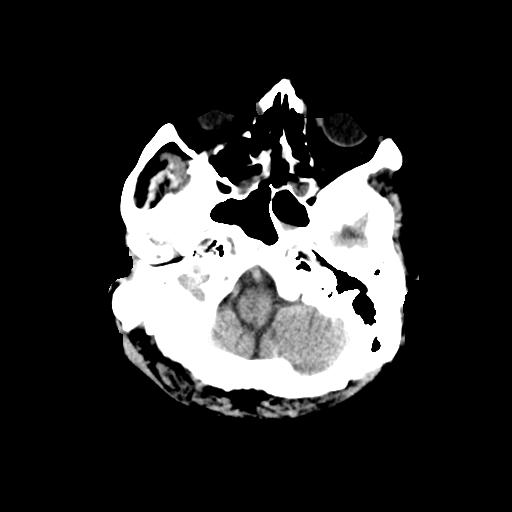
[im 4/32  bone]
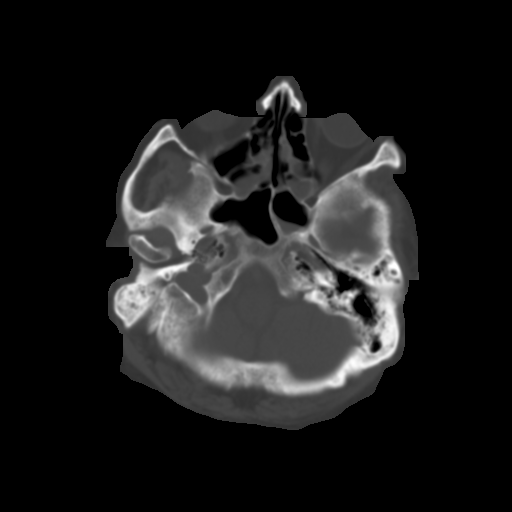
[im 8/32  brain]
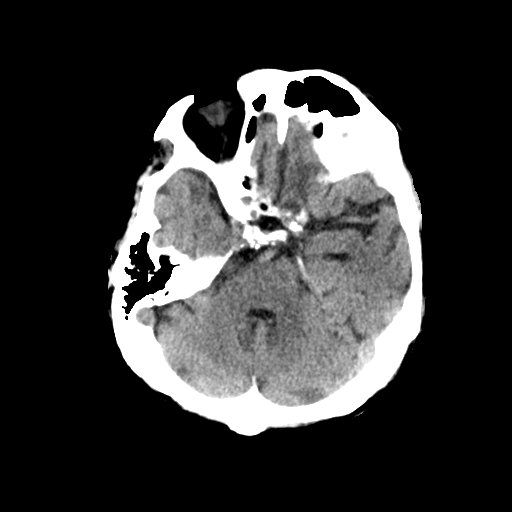
[im 12/32  brain]
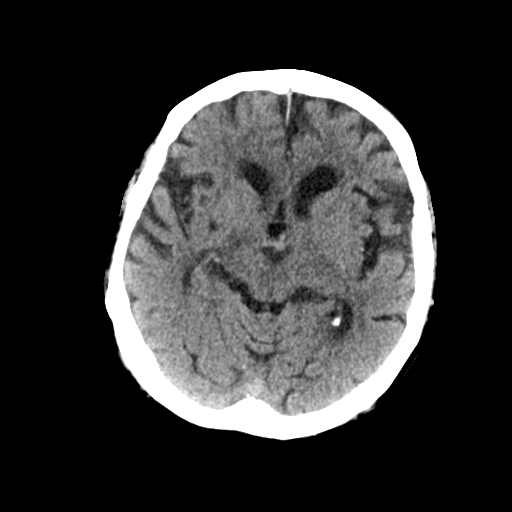
[im 16/32  brain]
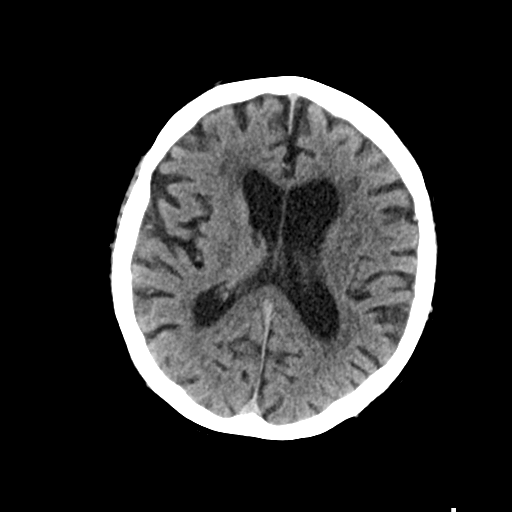
[im 20/32  brain]
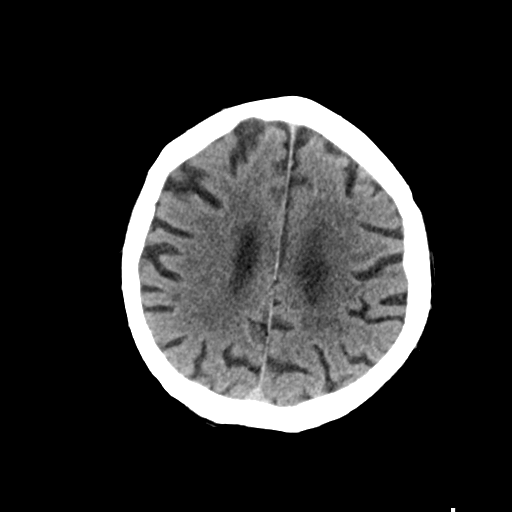
[im 20/32  bone]
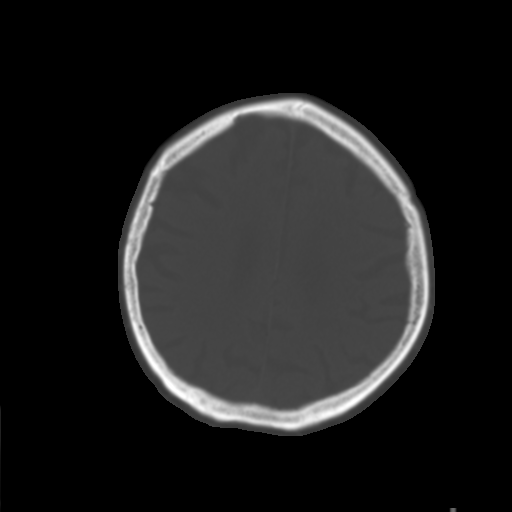
[im 24/32  brain]
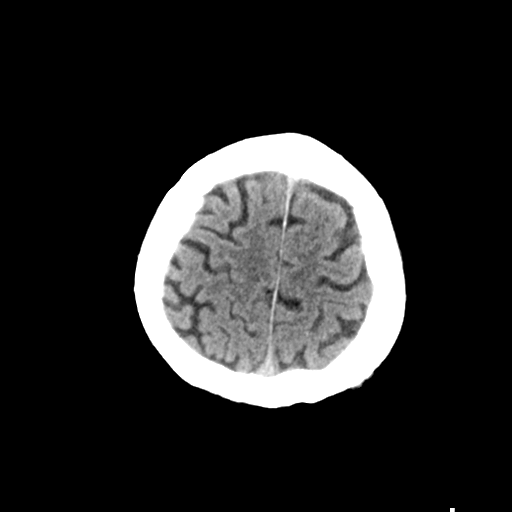
[im 28/32  brain]
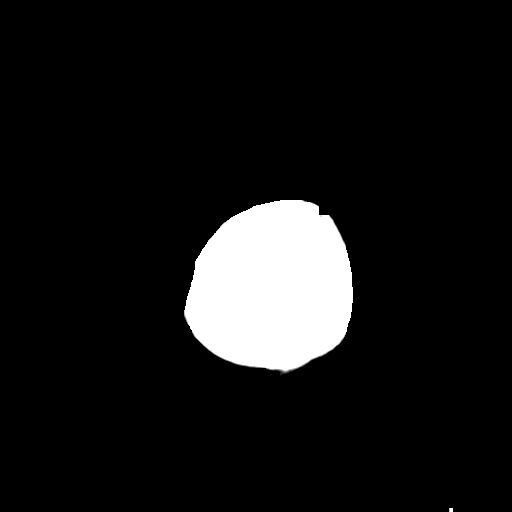

[Series 4: head bone · axial · 0.49mm/px · z∈[+1503,+1519]mm · 2 of 79 slices shown]
[im 8/79  bone]
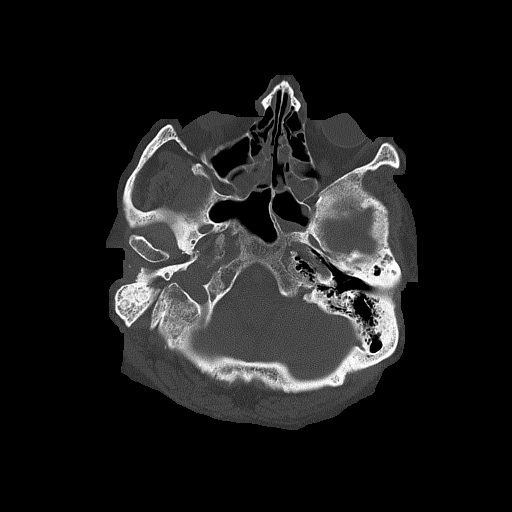
[im 16/79  bone]
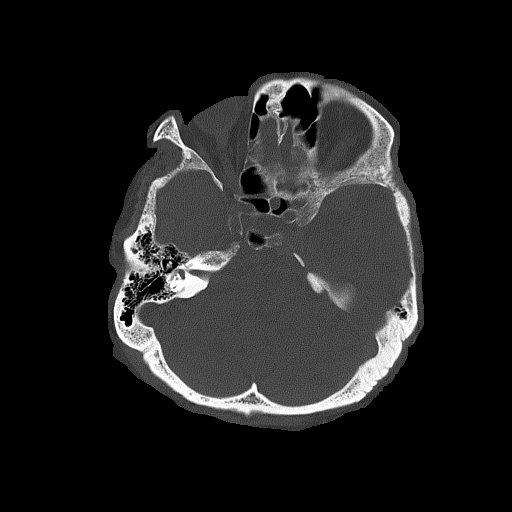

[Series 5: head without cor · coronal · non-contrast · 0.31mm/px · 3 of 72 slices shown]
[im 24/72  brain]
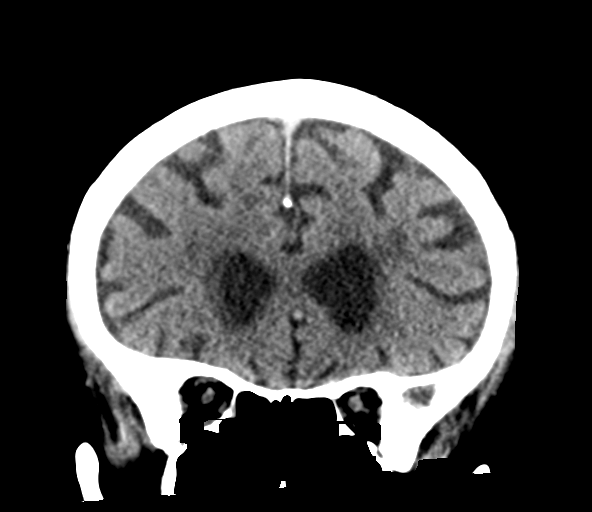
[im 32/72  brain]
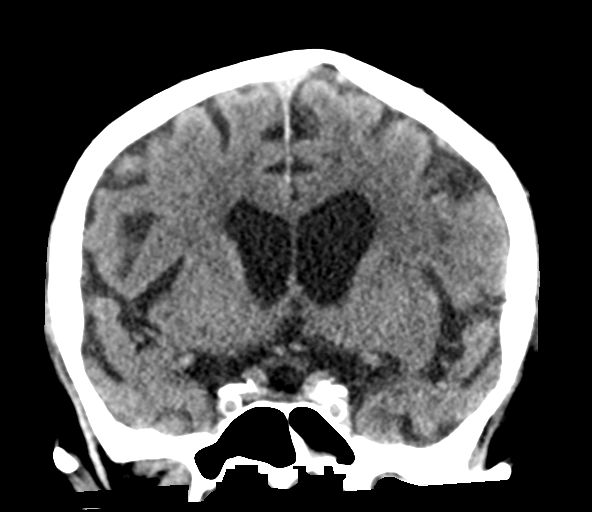
[im 40/72  brain]
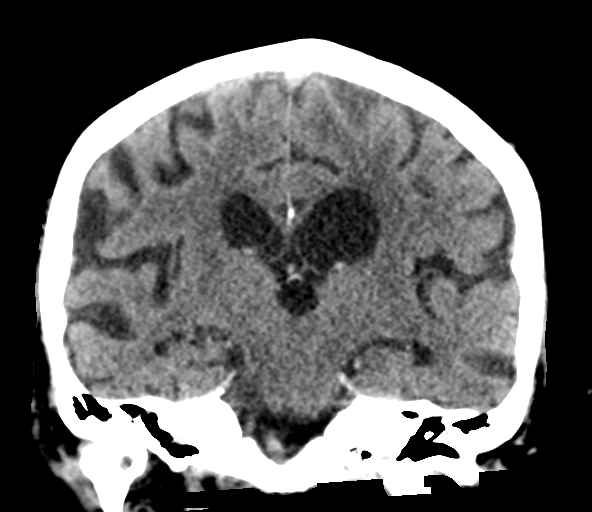

[Series 6: head without sag · sagittal · non-contrast · 0.31mm/px · 3 of 67 slices shown]
[im 23/67  brain]
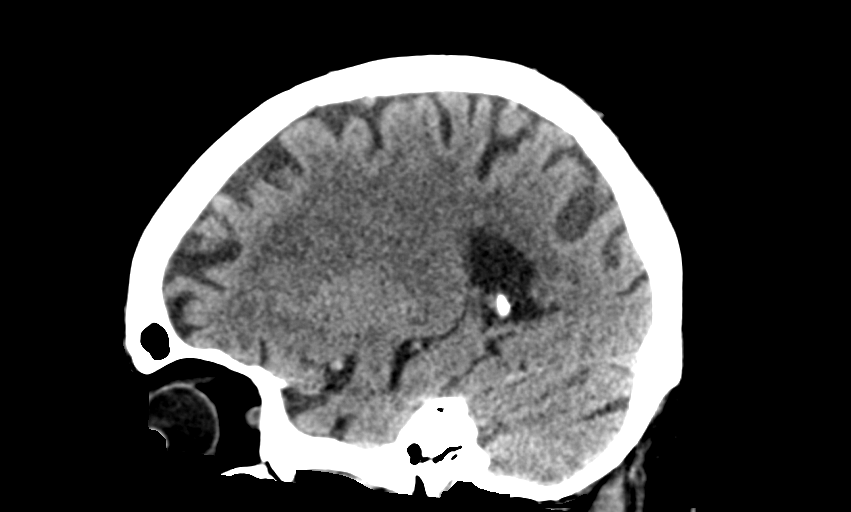
[im 34/67  brain]
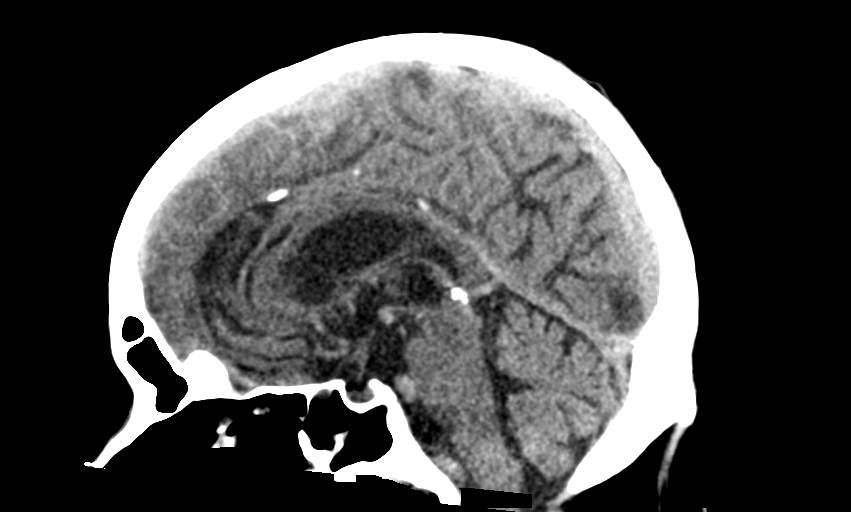
[im 45/67  brain]
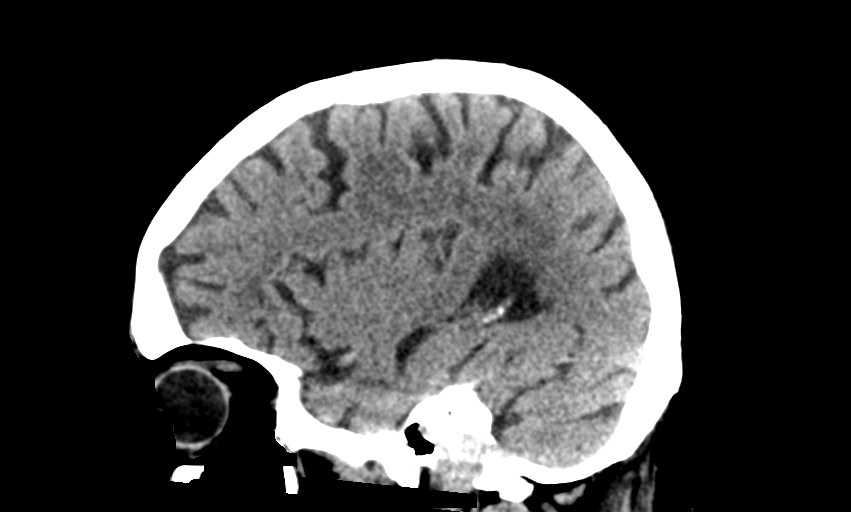

[15 of 47 positions shown; findings below may reference images not displayed]

FINDINGS: Brain: Generalized atrophy. Normal ventricular morphology. No
midline shift or mass effect. Small vessel chronic ischemic changes
of deep cerebral white matter. No intracranial hemorrhage, mass
lesion, evidence of acute infarction, or extra-axial fluid
collection.

Vascular: Atherosclerotic calcification of internal carotid and
vertebral arteries at skull base

Skull: Intact

Sinuses/Orbits: Scattered mucosal thickening in ethmoid air cells
and sphenoid sinus. Minimal fluid RIGHT maxillary sinus.

Other: N/A
IMPRESSION: Atrophy with small vessel chronic ischemic changes of deep cerebral
white matter.

No acute intracranial abnormalities.

## 2023-02-05 DIAGNOSIS — H35373 Puckering of macula, bilateral: Secondary | ICD-10-CM | POA: Diagnosis not present

## 2023-02-05 DIAGNOSIS — H2512 Age-related nuclear cataract, left eye: Secondary | ICD-10-CM | POA: Diagnosis not present

## 2023-02-05 DIAGNOSIS — H52203 Unspecified astigmatism, bilateral: Secondary | ICD-10-CM | POA: Diagnosis not present

## 2023-02-05 DIAGNOSIS — Z961 Presence of intraocular lens: Secondary | ICD-10-CM | POA: Diagnosis not present

## 2023-03-27 DIAGNOSIS — G4733 Obstructive sleep apnea (adult) (pediatric): Secondary | ICD-10-CM | POA: Diagnosis not present

## 2023-04-03 DIAGNOSIS — N1832 Chronic kidney disease, stage 3b: Secondary | ICD-10-CM | POA: Diagnosis not present

## 2023-04-11 DIAGNOSIS — N320 Bladder-neck obstruction: Secondary | ICD-10-CM | POA: Diagnosis not present

## 2023-04-11 DIAGNOSIS — I509 Heart failure, unspecified: Secondary | ICD-10-CM | POA: Diagnosis not present

## 2023-04-11 DIAGNOSIS — I129 Hypertensive chronic kidney disease with stage 1 through stage 4 chronic kidney disease, or unspecified chronic kidney disease: Secondary | ICD-10-CM | POA: Diagnosis not present

## 2023-04-11 DIAGNOSIS — N1832 Chronic kidney disease, stage 3b: Secondary | ICD-10-CM | POA: Diagnosis not present

## 2023-04-11 DIAGNOSIS — E559 Vitamin D deficiency, unspecified: Secondary | ICD-10-CM | POA: Diagnosis not present

## 2023-04-26 DIAGNOSIS — G4733 Obstructive sleep apnea (adult) (pediatric): Secondary | ICD-10-CM | POA: Diagnosis not present

## 2023-05-13 ENCOUNTER — Other Ambulatory Visit: Payer: Self-pay | Admitting: Cardiovascular Disease

## 2023-05-14 NOTE — Telephone Encounter (Signed)
Prescription refill request for Xarelto received.  Indication:AFIB Last office visit:5/24 Weight:88.9  kg Age:82 Scr:2.14  10/24 CrCl:32.89  ml/min  Prescription refilled

## 2023-05-24 DIAGNOSIS — L570 Actinic keratosis: Secondary | ICD-10-CM | POA: Diagnosis not present

## 2023-05-24 DIAGNOSIS — C44329 Squamous cell carcinoma of skin of other parts of face: Secondary | ICD-10-CM | POA: Diagnosis not present

## 2023-05-27 DIAGNOSIS — G4733 Obstructive sleep apnea (adult) (pediatric): Secondary | ICD-10-CM | POA: Diagnosis not present

## 2023-05-31 ENCOUNTER — Other Ambulatory Visit: Payer: Self-pay | Admitting: Cardiovascular Disease

## 2023-06-04 ENCOUNTER — Encounter (INDEPENDENT_AMBULATORY_CARE_PROVIDER_SITE_OTHER): Payer: Medicare HMO | Admitting: Ophthalmology

## 2023-06-04 DIAGNOSIS — H35033 Hypertensive retinopathy, bilateral: Secondary | ICD-10-CM | POA: Diagnosis not present

## 2023-06-04 DIAGNOSIS — H43812 Vitreous degeneration, left eye: Secondary | ICD-10-CM

## 2023-06-04 DIAGNOSIS — H35372 Puckering of macula, left eye: Secondary | ICD-10-CM | POA: Diagnosis not present

## 2023-06-04 DIAGNOSIS — H35341 Macular cyst, hole, or pseudohole, right eye: Secondary | ICD-10-CM | POA: Diagnosis not present

## 2023-06-04 DIAGNOSIS — I10 Essential (primary) hypertension: Secondary | ICD-10-CM | POA: Diagnosis not present

## 2023-06-05 DIAGNOSIS — G4733 Obstructive sleep apnea (adult) (pediatric): Secondary | ICD-10-CM | POA: Diagnosis not present

## 2023-06-21 DIAGNOSIS — Z1331 Encounter for screening for depression: Secondary | ICD-10-CM | POA: Diagnosis not present

## 2023-06-21 DIAGNOSIS — Z0000 Encounter for general adult medical examination without abnormal findings: Secondary | ICD-10-CM | POA: Diagnosis not present

## 2023-07-05 ENCOUNTER — Other Ambulatory Visit: Payer: Self-pay | Admitting: Cardiovascular Disease

## 2023-07-06 ENCOUNTER — Other Ambulatory Visit (HOSPITAL_BASED_OUTPATIENT_CLINIC_OR_DEPARTMENT_OTHER): Payer: Self-pay

## 2023-07-06 ENCOUNTER — Other Ambulatory Visit (HOSPITAL_COMMUNITY): Payer: Self-pay

## 2023-07-07 ENCOUNTER — Other Ambulatory Visit (HOSPITAL_COMMUNITY): Payer: Self-pay

## 2023-07-10 ENCOUNTER — Other Ambulatory Visit (HOSPITAL_COMMUNITY): Payer: Self-pay

## 2023-07-11 ENCOUNTER — Other Ambulatory Visit (HOSPITAL_COMMUNITY): Payer: Self-pay

## 2023-07-11 MED ORDER — ALLOPURINOL 100 MG PO TABS
100.0000 mg | ORAL_TABLET | Freq: Every day | ORAL | 4 refills | Status: DC
  Filled 2023-07-25: qty 90, 90d supply, fill #0
  Filled 2023-11-05: qty 90, 90d supply, fill #1

## 2023-07-11 MED ORDER — VALSARTAN 80 MG PO TABS
80.0000 mg | ORAL_TABLET | Freq: Every day | ORAL | 3 refills | Status: AC
  Filled 2023-08-08: qty 100, 100d supply, fill #0
  Filled 2023-11-12: qty 100, 100d supply, fill #1
  Filled 2024-04-21: qty 100, 100d supply, fill #2

## 2023-07-11 MED ORDER — FUROSEMIDE 40 MG PO TABS
40.0000 mg | ORAL_TABLET | Freq: Every day | ORAL | 3 refills | Status: DC
  Filled 2023-08-08: qty 180, 90d supply, fill #0

## 2023-07-12 ENCOUNTER — Other Ambulatory Visit (HOSPITAL_COMMUNITY): Payer: Self-pay

## 2023-07-25 ENCOUNTER — Other Ambulatory Visit: Payer: Self-pay

## 2023-08-08 ENCOUNTER — Other Ambulatory Visit: Payer: Self-pay | Admitting: Cardiovascular Disease

## 2023-08-08 ENCOUNTER — Other Ambulatory Visit: Payer: Self-pay

## 2023-08-08 ENCOUNTER — Other Ambulatory Visit (HOSPITAL_COMMUNITY): Payer: Self-pay

## 2023-08-08 MED FILL — Metoprolol Succinate Tab ER 24HR 25 MG (Tartrate Equiv): ORAL | 90 days supply | Qty: 90 | Fill #0 | Status: AC

## 2023-08-08 MED FILL — Rivaroxaban Tab 15 MG: ORAL | 90 days supply | Qty: 90 | Fill #0 | Status: AC

## 2023-08-08 NOTE — Telephone Encounter (Signed)
*  STAT* If patient is at the pharmacy, call can be transferred to refill team.   1. Which medications need to be refilled? (please list name of each medication and dose if known)  atorvastatin  (LIPITOR) 40 MG tablet   2. Wspironolactone (ALDACTONE ) 25 MG tablet hich pharmacy/location (including street and city if local pharmacy) is medication to be sent to? [place] [facility]   3. Do they need a 30 day or 90 day supply? 90

## 2023-08-08 NOTE — Telephone Encounter (Signed)
Pt is requesting a refill on atorvastatin. Dr. Tresa Endo did not prescribe this medication. Would Dr. Tresa Endo like to refill this medication? Please address

## 2023-08-09 ENCOUNTER — Other Ambulatory Visit (HOSPITAL_COMMUNITY): Payer: Self-pay

## 2023-08-09 MED ORDER — ATORVASTATIN CALCIUM 40 MG PO TABS
40.0000 mg | ORAL_TABLET | Freq: Every day | ORAL | 3 refills | Status: DC
  Filled 2023-08-09: qty 90, 90d supply, fill #0
  Filled 2023-11-05: qty 90, 90d supply, fill #1
  Filled 2024-02-01: qty 90, 90d supply, fill #2

## 2023-09-07 ENCOUNTER — Encounter (HOSPITAL_BASED_OUTPATIENT_CLINIC_OR_DEPARTMENT_OTHER): Payer: Self-pay

## 2023-09-11 ENCOUNTER — Ambulatory Visit: Payer: Medicare HMO | Attending: Cardiovascular Disease | Admitting: Cardiovascular Disease

## 2023-09-11 DIAGNOSIS — I10 Essential (primary) hypertension: Secondary | ICD-10-CM | POA: Diagnosis not present

## 2023-09-11 DIAGNOSIS — M25473 Effusion, unspecified ankle: Secondary | ICD-10-CM | POA: Diagnosis not present

## 2023-09-11 DIAGNOSIS — Z951 Presence of aortocoronary bypass graft: Secondary | ICD-10-CM

## 2023-09-11 DIAGNOSIS — E785 Hyperlipidemia, unspecified: Secondary | ICD-10-CM

## 2023-09-11 DIAGNOSIS — I4819 Other persistent atrial fibrillation: Secondary | ICD-10-CM | POA: Diagnosis not present

## 2023-09-11 DIAGNOSIS — G4733 Obstructive sleep apnea (adult) (pediatric): Secondary | ICD-10-CM | POA: Diagnosis not present

## 2023-09-11 DIAGNOSIS — I2510 Atherosclerotic heart disease of native coronary artery without angina pectoris: Secondary | ICD-10-CM

## 2023-09-11 DIAGNOSIS — I482 Chronic atrial fibrillation, unspecified: Secondary | ICD-10-CM

## 2023-09-11 DIAGNOSIS — D6869 Other thrombophilia: Secondary | ICD-10-CM | POA: Diagnosis not present

## 2023-09-11 DIAGNOSIS — I4811 Longstanding persistent atrial fibrillation: Secondary | ICD-10-CM | POA: Diagnosis not present

## 2023-09-11 NOTE — Progress Notes (Signed)
 Patient ID: Shane Espinoza, male   DOB: 04-19-1941, 83 y.o.   MRN: [id number]        HPI: Shane Espinoza is a 83 y.o. male who presents to the office for a 10 month cardiology evaluation.    Shane Espinoza has established CAD and underwent CABG revascularization surgery by Dr. Morton Peters on 03/07/2011 after a nuclear perfusion study revealed significant scar/ischemia and cardiac catheterization demonstrated severe multivessel CAD. He had aLIMA to the LAD, vein to the OM, vein to the RV marginal, and vein to the PDA. He  developed PAF postoperatively and underwent TEE guided cardioversion with restoration of sinus rhythm. He completed cardiac rehabilitation. A nuclear perfusion study in August 2013 was markedly improved did not reveal any region of scar or ischemia. He has been off amiodarone and maintaining sinus rhythm. Additional problems include hypertension, hyperlipidemia, and intermittent lower extremity edema.  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  An MRI of his brain showed a tiny left operculum, hemorrhage, infarct, is concerned that potentially this was thrombotic.  He did not have any loss of function of the upper or lower extremities and denied any headache, dizziness, visual symptoms, or difficulty swallowing.  His ECG at that time did suggest sinus rhythm.  He now is on anticoagulation with Xarelto 20 mg. His head CT was negative as was his MRA of the large and medium size vessels.  His carotids were widely patent.  He denies chest pain.  He denies any significant neurologic sequelae.  He has permanent atrial fibrillation and is on chronic anticoagulation with Xarelto.  He has had episodes of recurrent gout.  I had taken him off HCTZ since this could be contributing to elevation of uric acid.  He is now on low-dose chronic alliupurinol and since instituting therapy has not had any recurrent gout episodes.   When I saw him in October 2018 he  denied any episodes of chest pain, palpitations, presyncope or syncope.  He underwent a nuclear stress test on 02/20/2017 which showed an EF of 47% with mild diffuse hypokinesis.  Is no evidence for scar or ischemia.  Because of the slightly reduced EF.  He underwent a echo Doppler study on 03/14/2017.  This showed an estimated EF at 55% without regional wall motion abnormalities.  His left atrium was severely dilated.  Contributed by his atrial fibrillation.  He has had issues with some intermittent leg swelling and was on amlodipine at 2.5 mg, which was reduced because of edema, furosemide 20 mg 5 days per week, irbesartan 300 mg and metoprolol, tartrate 25 mg in the morning and 12.5 mmol at night.  He is on simvastatin for hyperlipidemia and Xarelto for anticoagulation.    I saw him on June 04, 2018 at which time he remained relatively stable from a cardiac standpoint.  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  He does use a recumbent bike at the gym.  He underwent laboratory by Dr. Kevan Ny September 2019 which showed a glucose of 134.  Renal function was normal.  Lipid studies are excellent with a total cholesterol 111 HDL 37 LDL 53 and triglycerides 105.  Creatinine was 0.99.  Hemoglobin was 15.  He has been wearing support stockings 15 to 20 mm which has been helpful for his leg swelling.  He states his blood pressure typically runs in the 140s to 150s.  His pulse typically runs in  the 50s to 60s and occasionally in the 70s.    When I  saw him in December 2020 he was not able to go to the gym due to the COVID-19 pandemic.  His main exercise was walking his dog.  He admits to some weight gain.  He denied anginal symptoms.  He has had follow-up lab work with his primary physician Dr. Kevan Ny and hemoglobin A1c was 6.2.  He wears support stockings most of the time.  He denies bleeding on anticoagulation.  His atrial fibrillation rate  has been  well controlled.  At that evaluation, his blood pressure was stable on amlodipine at a reduced dose of 2.5 mg, metoprolol succinate 25 mg, valsartan 320 mg and spironolactone 12.5 mg.  I saw him in October 2021.  Over the prior year he had done fairly well from a cardiovascular standpoint.    However, he had recently had diarrhea and was diagnosed with C. difficile and E. coli for which he took antibiotics.  He also is uncircumcised and his have been difficulty with scarring.  He has seen Dr. Retta Diones of [facility] and was told of having phimosis.  He is in need to undergo a modified circumcision and has been on anticoagulation therapy.   Since I saw him, he had fallen and fractured his right ankle in January 2022.  He was hospitalized in February 2022 from February 9 through February 24 with left-sided weakness and numbness and recurrent fall.  He continued to have some weakness in his left hand.  An MRI of his brain showed a focus of restricted diffusion with acute/subacute infarct and periventricular white matter.  He was evaluated by Dr. Pearlean Brownie who felt a stroke was due to small vessel disease.  An echo Doppler study on August 05, 2020 showed an EF of 55 to 60% with grade 1 diastolic dysfunction, mild biatrial enlargement, and aortic valve sclerosis.  He was maintained on Xarelto anticoagulation.  Metformin was added for blood sugar control and he also underwent rehabilitation.  Subsequently, he developed a left leg wound culture positive for Pseudomonas for which she was treated with Cipro and additional antibiotic.  When I saw him on Nov 15, 2020 he denied any chest pain but he was having some ankle swelling.  He was undergoing neuro rehabilitation.  He continued to be on atorvastatin 40 mg for hyperlipidemia.  He has been on furosemide 20 or 40 mg depending upon edema, metoprolol succinate 25 mg daily, metformin and continues to be on Xarelto with his atrial fibrillation anticoagulation.  During  that evaluation, his resting pulse was in the 80s and he had mild blood pressure elevation.  With his leg swelling I recommended he increase furosemide to 40 mg every morning and depending upon residual edema he may need to take an extra 20 mg in the afternoon as needed.  I recommended support stockings and follow-up laboratory.  I saw him in February 25, 2021 and since his prior evaluation with me he had developed some shortness of breath and wheezing on July 6 which was treated with furosemide.  He is followed by Dr. Kevan Ny.  He has also seen Dr. Allie Dimmer and a sleep study demonstrated mild sleep apnea.  He is awaiting a CPAP machine.  Presently he is on atorvastatin 40 mg, furosemide 60 mg in the morning, metoprolol succinate 25 mg, spironolactone 25 mg, valsartan 80 mg, and he continues to take Xarelto for anticoagulation.  During that evaluation, he was taking 60 mg  of Lasix in the morning and there was significant improvement with only residual trivial left ankle swelling.  I saw him on June 15, 2021.  At that time he continued to be stable and denied any chest pain or shortness of breath.  He underwent a follow-up echo Doppler study on March 25, 2021 which showed an EF of 55 to 60%, mild biatrial enlargement, mild mitral annular calcification with moderate MR and mild aortic sclerosis without stenosis.  Of note, creatinine in August 2022 was 1.2.  He had had recent laboratory by Dr. Kevan Ny on May 24, 2021 and his creatinine was now 2.01.  Potassium was 4.6.  Hemoglobin A1c was 6.6.  Lipid studies revealed an LDL cholesterol at 62 with triglycerides 134 total cholesterol 130 and HDL 45.  He has been taking Lasix 40 mg in the morning ER and 20 mg in the afternoon, metoprolol succinate 25 mg daily, spironolactone 25 mg daily, valsartan 80 mg daily.  He continues to be on Xarelto.  He is on atorvastatin 40 mg.  During that evaluation, his atrial fibrillation was controlled with a rate in the 70s.   Blood pressure was stable but on the low side.  I recommended he decrease spironolactone down to 12.5 mg and recommended he change his furosemide from 40 mg daily down to 40 mg alternating with 20 mg every other day.  Repeat laboratory was recommended.  I saw him on Nov 03, 2021.  Since his prior evaluation he had COVID in April 2023 with cough and congestion and received molnupiravir 800 mg every 12 hours for 5 days. He has had issues with hematuria and will need a modified circumcision.  He had undergone CT of his kidney.  Recent creatinine on October 04, 2021 was 1.9.  His primary care doctor, Dr. Kevan Ny is retiring and he will be transitioning to Dr. Orson Aloe.  He continues to be in permanent atrial fibrillation.  He denies any chest pain.  He is now on CPAP therapy followed by Dr. Allie Dimmer.    I saw him on May 31, 2022.  Since his prior evaluation with me Dr. Allie Dimmer has retired and he now is going to see Dr. Betti Cruz for his CPAP.  He continues to be followed by Dr. Ronalee Belts of nephrology.  He denies chest pain or shortness of breath.  He is unaware of palpitations.  There has been some increased stress on the home front with his 84 year old adopted daughter with metastatic breast cancer.  He has been using CPAP and is sleeping well.  He continues to be on furosemide 20 mg, metoprolol succinate 25 mg, spironolactone 12.5 mg, valsartan 80 mg daily.  He is anticoagulated on reduced dose Xarelto 15 mg.  He is on atorvastatin 40 mg for hyperlipidemia.  LDL cholesterol in November 2022 was 62.    I last saw him on Nov 21, 2022.  He has now establish primary care with Dr. Orson Aloe since Dr. Kevan Ny has retired.  He underwent laboratory on June 15, 2022.  Hemoglobin 14.3 hematocrit 44.2.  Platelets 182,000.  B12 892.  Total cholesterol 117, triglycerides 120, HDL 40, LDL 55.  TSH was 2.43.  He continues to be in permanent atrial fibrillation and is on reduced dose Xarelto at 15 mg daily with his stage IIIb CKD  and GFR at 38.  He denies chest pain or shortness of breath.  He does have lower extremity venous insufficiency.  He was evaluated by Dr. Jaynie Collins and had lower extremity  Doppler study.  He has bilateral noncompressible right and left lower extremity arteries.  ABIs were normal.  He presents for evaluation.  Since I last saw him, he denies any chest pain or significant shortness of breath.  He continues to use CPAP and is now followed by Dr. Ermalinda Memos at [facility].  He sees Dr. Ronalee Belts for nephrology and creatinine on April 03, 2023 was 2.14.  He does admit to lower extremity edema particularly involving his ankles and has venous insufficiency.  He currently has been taking furosemide 20 mg, spironolactone 12.5 mg, valsartan 80 mg.  He continues to be on Xarelto 15 mg for anticoagulation.  He is on atorvastatin 40 mg for hyperlipidemia.  He takes allopurinol with history of gout.  He presents for evaluation.   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   [name]    Past Surgical History:  Procedure Laterality Date   CARDIAC CATHETERIZATION  03/02/2011   Recommended CABG   COLON SURGERY     CORONARY ARTERY BYPASS GRAFT  03/07/2011   DR.VAN  TRIGHT   EYE SURGERY     LEXISCAN MYOVIEW  02/20/2012   EKG negative for ischemia, noraml study   TRANSTHORACIC ECHOCARDIOGRAM  02/20/2012   EF 50-55%, mild-moderate concentric LVH, LA moderately dilated, moderate mitral regurg   VASCULAR DOPPLER  03/03/2011   Nosignificant extracranial carotid artery stenosis    Allergies  Allergen Reactions   Lisinopril     Other reaction(s): cough    Current Outpatient Medications  Medication Sig Dispense Refill   allopurinol (ZYLOPRIM) 100 MG tablet Take 1 tablet (100 mg total) by mouth daily. 90 tablet 4   atorvastatin (LIPITOR) 40 MG  tablet Take 1 tablet (40 mg total) by mouth at bedtime. 90 tablet 3   Cholecalciferol (VITAMIN D-3) 5000 UNIT/ML LIQD 1 capsule     metoprolol succinate (TOPROL-XL) 25 MG 24 hr tablet Take 1 tablet (25 mg total) by mouth daily. 90 tablet 1   Rivaroxaban (XARELTO) 15 MG TABS tablet Take 1 tablet (15 mg total) by mouth daily with supper. 90 tablet 1   spironolactone (ALDACTONE) 25 MG tablet Take 0.5 tablets (12.5 mg total) by mouth daily. 60 tablet 1   valsartan (DIOVAN) 80 MG tablet Take 1 tablet (80 mg total) by mouth daily. 100 tablet 3   vitamin B-12 (CYANOCOBALAMIN) 1000 MCG tablet Take 1,000 mcg by mouth daily.     Vitamin D, Ergocalciferol, (DRISDOL) 1.25 MG (50000 UNIT) CAPS capsule Take 50,000 Units by mouth 3 (three) times a week.     furosemide (LASIX) 40 MG tablet Take 1-2 tablets (40-80 mg total) by mouth daily. 180 tablet 3   No current facility-administered medications for this visit.    Social History   Socioeconomic History   Marital status: Married    Spouse name: [name]   Number of children: 2   Years of education: college   Highest education level: Not on file  Occupational History   Occupation: retitred  Tobacco Use   Smoking status: Never   Smokeless tobacco: Never  Vaping Use   Vaping status: Never Used  Substance and Sexual Activity   Alcohol use: No   Drug use: No   Sexual activity: Never  Other Topics Concern   Not on file  Social History Narrative   Lives at home with wife   Right Handed   Drinks caffeine occassionally   Social Drivers of Health   Financial Resource Strain: Not on file  Food Insecurity: Not on file  Transportation Needs: Not on file  Physical Activity: Not on file  Stress: Not on file  Social Connections: Not on file  Intimate Partner Violence: Not on file    Family History  Problem Relation Age of Onset   CVA Father    Heart attack Maternal Grandmother    Heart attack Maternal Grandfather    Heart murmur Sister     Hypertension Son    Socially he is married has 2 children 4 grandchildren. He now exercises fairly regularly. There is no tobacco or alcohol use.   ROS General: Negative; No fevers, chills, or night sweats;  HEENT: Negative; No changes in vision or hearing, sinus congestion, difficulty swallowing Pulmonary: Negative; No cough, wheezing, shortness of breath, hemoptysis Cardiovascular: see history of present illness Positive for intermittent edema GI: Positive for hemorrhoids and he status post hemorrhoidal banding; recent C. difficile infection GU: Recently diagnosed phimosis in need for modified circumcision Musculoskeletal: Positive for gout; right ankle fracture with oblique fracture of the distal fibula Hematologic/Oncology: Negative; no easy bruising, bleeding Endocrine: Negative; no heat/cold intolerance; no diabetes Neuro: Positive for thrombotic stroke; recent probable stroke due to small vessel disease;no changes in balance, headaches Skin: Negative; No rashes or skin lesions Psychiatric: Negative; No behavioral problems, depression Sleep: Positive for OSA, now on CPAP therapy unaware of residual snoring, daytime sleepiness, hypersomnolence, bruxism, restless legs, hypnogognic hallucinations, no cataplexy Other comprehensive 14 point system review is negative.   PE BP (!) 110/54 (BP Location: Left Arm, Patient Position: Sitting, Cuff Size: Normal)   Pulse 70   Ht 5\' 9"  (1.753 m)   Wt 214 lb (97.1 kg)   SpO2 98%   BMI 31.60 kg/m    Repeat blood pressure by me 120/62  Wt Readings from Last 3 Encounters:  09/11/23 214 lb (97.1 kg)  11/21/22 196 lb (88.9 kg)  11/09/22 215 lb (97.5 kg)   General: Alert, oriented, no distress.  Skin: normal turgor, no rashes, warm and dry HEENT: Normocephalic, atraumatic. Pupils equal round and reactive to light; sclera anicteric; extraocular muscles intact;  Nose without nasal septal hypertrophy Mouth/Parynx benign; Mallinpatti scale  3 Neck: No JVD, no carotid bruits; normal carotid upstroke Lungs: clear to ausculatation and percussion; no wheezing or rales Chest wall: without tenderness to palpitation Heart: PMI not displaced, irregularly irregular consistent with atrial fibrillation rate in the 70s, s1 s2 normal, 1/6 systolic murmur, no diastolic murmur, no rubs, gallops, thrills, or heaves Abdomen: soft, nontender; no hepatosplenomehaly, BS+; abdominal aorta nontender and not dilated by palpation. Back: no CVA tenderness Pulses 2+ Musculoskeletal: full range of motion, normal strength, no joint deformities Extremities: Bilateral 2+ ankle edema.  Venous insufficiency ,no clubbing cyanosis , Homan's sign negative  Neurologic: grossly nonfocal; Cranial nerves grossly wnl Psychologic: Normal mood and affect  EKG Interpretation Date/Time:  Tuesday September 11 2023 08:33:28 EDT Ventricular Rate:  70 PR Interval:    QRS Duration:  90 QT Interval:  416 QTC Calculation: 449 R Axis:   6  Text Interpretation: Atrial fibrillation Nonspecific ST abnormality When compared with ECG of 05-Aug-2020 09:23, PREVIOUS ECG IS PRESENT Confirmed by Nicki Guadalajara ([id number]) on 09/17/2023 10:39:47 AM      Nov 21, 2022  ECG (independently read by me):  Atrial fibrillation at 63   May 31, 2022 ECG (independently read by me): Atrial fibrillation at 71, PVC  Nov 03, 2021 ECG (independently read by me): Atrial fibrillation at 65  June 15, 2021 ECG (independently read by me): Atrial fibrillation at 78  February 25, 2021 ECG (independently read by me):  Atrial fibrillation at 68  Nov 15, 2020 ECG (independently read by me): Atrial fibrillation at 83  October 2021 ECG (independently read by me): Atrial fibrillation at 75, PVC  December 2020 ECG (independently read by me): Atrial fibrillation at 81 bpm.  Poor anterior R wave progression.  QTc interval 446 ms  December 2019 ECG (independently read by me): Atrial fibrillation at 76 bpm QTc  interval 443 ms.  No ECG done today, but review of ECG at the time of his nuclear study on 02/20/2017 showed atrial fibrillation at 78 bpm  November 2017 ECG (independently read by me): Atrial fibrillation at 70 bpm.  Poor anterior R-wave progression.  QTc interval 475 ms.  November 2016 ECG (independently read by me): Atrial fibrillation at 67 bpm.  Nonspecific ST changes.  QTc interval normal at 443 ms.  May 2016 ECG (independently read by me): Atrial fibrillation with a controlled ventricular rate in the 60s.  December 2015 ECG (independently read by me): Atrial fibrillation with a ventricular rate at 84.  No significant ST-T changes  June 2015 ECG (independently read by me): Atrial fibrillation with a ventricular rate is 74.  Nonspecific ST changes.  10/09/2013 ECG (independently read by me): Atrial fibrillation with controlled ventricular response at 63 beats per minute.  Prior April 16 2013 ECG: Sinus bradycardia at 52 beats per minute.  LABS:    Latest Ref Rng & Units 08/24/2021   11:48 AM 07/28/2021    3:18 PM 06/29/2021    3:06 PM  BMP  Glucose 70 - 99 mg/dL 045  409  811   BUN 8 - 27 mg/dL 31  30  36   Creatinine 0.76 - 1.27 mg/dL 9.14  7.82  9.56   BUN/Creat Ratio 10 - 24 18  15  20    Sodium 134 - 144 mmol/L 142  144  141   Potassium 3.5 - 5.2 mmol/L 4.8  4.2  4.8   Chloride 96 - 106 mmol/L 105  103  102   CO2 20 - 29 mmol/L 23  24  24    Calcium 8.6 - 10.2 mg/dL 9.4  9.5  9.4       Latest Ref Rng & Units 06/29/2021    3:06 PM 02/22/2021   10:28 AM 08/12/2020    5:28 AM  Hepatic Function  Total Protein 6.0 - 8.5 g/dL 6.5  6.9  6.1   Albumin 3.6 - 4.6 g/dL 3.7  4.1  2.9   AST 0 - 40 IU/L 32  34  25   ALT 0 - 44 IU/L 29  30  20    Alk Phosphatase 44 - 121 IU/L 139  175  64   Total Bilirubin 0.0 - 1.2 mg/dL 0.7  1.0  1.1       Latest Ref Rng & Units 02/22/2021   10:28 AM 08/23/2020    5:56 AM 08/16/2020    5:47 AM  CBC  WBC 3.4 - 10.8 x10E3/uL 8.2  7.1  8.3    Hemoglobin 13.0 - 17.7 g/dL 21.3  08.6  57.8   Hematocrit 37.5 - 51.0 % 49.4  46.0  43.6   Platelets 150 - 450 x10E3/uL 183  202  179    Lab Results  Component Value Date   MCV 88 02/22/2021   MCV 93.1 08/23/2020   MCV 91.6 08/16/2020   Lab Results  Component Value Date   TSH 2.560 02/22/2021   Lab Results  Component Value Date   HGBA1C 6.5 (H) 08/06/2020   Lipid Panel     Component Value Date/Time   CHOL 130 02/22/2021 1028   TRIG 121 02/22/2021 1028   HDL 43 02/22/2021 1028   CHOLHDL 3.0 02/22/2021 1028   CHOLHDL 2.8 08/06/2020 0604   VLDL 20 08/06/2020 0604   LDLCALC 65 02/22/2021 1028    IMPRESSION:  1. Primary hypertension   2. CAD in native artery   3. S/P CABG x 4   4. Persistent atrial fibrillation (HCC)   5. Hypercoagulable state due to longstanding persistent atrial fibrillation (HCC)   6. OSA (obstructive sleep apnea) on CPAP   7. Hyperlipidemia with target LDL less than 70   8. Ankle edema     ASSESSMENT AND PLAN: Mr. Bartoli is an 83 year-old Caucasian male who underwent CABG revascularization surgery for severe multivessel CAD in September 2012.  He suffered a small thrombotic CVA most likely due to atrial fibrillation etiology in March 2015 and had complete resolution of symptoms.  He has permanent atrial fibrillation with a controlled ventricular response for which he is entirely asymptomatic.  He continues to be on Xarelto anticoagulation therapy, currently at 15 mg daily with his reduced GFR.  A nuclear stress test in August 2018 continued to show normal perfusion without scar or ischemia.  There was some discordance to the EF on the nuclear study when compared to the echo.  The echo revealed an EF of 55% without wall motion abnormality, severe LA dilatation, which undoubtedly is contributed by his atrial fibrillation.  On July 07, 2020 he had fallen and sustained a oblique fracture of the distal fibula.  In August 05, 2020 he was hospitalized with  left-sided weakness and numbness after recurrent fall.  He had resolution of left lower extremity symptoms but he continued to have weakness in his left hand.  An MRI of the brain showed probable acute subacute infarct in the periventricular white matter and it was felt by Dr. Pearlean Brownie that his stroke was due to small vessel disease.  He has continued to be on Xarelto at 15 mg for his permanent atrial fibrillation and with his age of over 79 years and CKD.  He had been successful with weight loss with BMI reduced to 29.81-year ago and today has slightly increased at 31.6.  He does have 2+ ankle edema today and apparently has been taking furosemide only at 20 mg in addition to spironolactone 12.5 mg and valsartan 80 mg daily.  I have recommended that he increase Lasix to 40 mg daily and have suggested support stockings.  He is now followed closely by Dr. Ronalee Belts of nephrology.  Creatinine was 2.14 on April 03, 2023.  He continues to be on atorvastatin 40 mg daily.  Lipid studies on June 21, 2023 were excellent with LDL cholesterol at 50, total cholesterol 112, triglycerides 135 and HDL 38.  He continues to use CPAP with excellent compliance and is remotely was followed by Dr. Allie Dimmer, Dr. Betti Cruz, and most recently Dr. Ermalinda Memos at [facility].  He had he is aware of my upcoming planned retirement later this year.  He will follow-up with Dr. Christiane Ha  Henderson for primary care.  I will transition him to the care of Dr. Royann Shivers for cardiologic evaluation in approximately 6 to 8 months or sooner as needed.      Shane Bihari, MD, [facility]  09/17/2023 10:46 AM

## 2023-09-11 NOTE — Patient Instructions (Signed)
 Medication Instructions:  Continue to wear the support stockings for edema.  *If you need a refill on your cardiac medications before your next appointment, please call your pharmacy*   Lab Work: No labs were ordered during today's visit.  If you have labs (blood work) drawn today and your tests are completely normal, you will receive your results only by: MyChart Message (if you have MyChart) OR A paper copy in the mail If you have any lab test that is abnormal or we need to change your treatment, we will call you to review the results.   Testing/Procedures: No procedures were ordered during today's visit.    Follow-Up: At [facility], you and your health needs are our priority.  As part of our continuing mission to provide you with exceptional heart care, we have created designated Provider Care Teams.  These Care Teams include your primary Cardiologist (physician) and Advanced Practice Providers (APPs -  Physician Assistants and Nurse Practitioners) who all work together to provide you with the care you need, when you need it.  We recommend signing up for the patient portal called "MyChart".  Sign up information is provided on this After Visit Summary.  MyChart is used to connect with patients for Virtual Visits (Telemedicine).  Patients are able to view lab/test results, encounter notes, upcoming appointments, etc.  Non-urgent messages can be sent to your provider as well.   To learn more about what you can do with MyChart, go to [email or web address].    Your next appointment:   6 month(s)  Provider:   Dr  Rachelle Hora Croitoru            1st Floor: - Lobby - Registration  - Pharmacy  - Lab - Cafe   2nd Floor: - PV Lab - Diagnostic Testing (echo, CT, nuclear med)   3rd Floor: - Vacant   4th Floor: - TCTS (cardiothoracic surgery) - AFib Clinic - Structural Heart Clinic - Vascular Surgery  - Vascular Ultrasound   5th Floor: - [facility]  (general and EP) - Clinical Pharmacy for coumadin, hypertension, lipid, weight-loss medications, and med management appointments      Valet parking services will be available as well.       Thank you for choosing [facility]!

## 2023-09-17 ENCOUNTER — Encounter: Payer: Self-pay | Admitting: Cardiovascular Disease

## 2023-09-18 DIAGNOSIS — G4733 Obstructive sleep apnea (adult) (pediatric): Secondary | ICD-10-CM | POA: Diagnosis not present

## 2023-09-18 DIAGNOSIS — R063 Periodic breathing: Secondary | ICD-10-CM | POA: Diagnosis not present

## 2023-09-23 DIAGNOSIS — G4733 Obstructive sleep apnea (adult) (pediatric): Secondary | ICD-10-CM | POA: Diagnosis not present

## 2023-09-24 DIAGNOSIS — L821 Other seborrheic keratosis: Secondary | ICD-10-CM | POA: Diagnosis not present

## 2023-09-24 DIAGNOSIS — Z85828 Personal history of other malignant neoplasm of skin: Secondary | ICD-10-CM | POA: Diagnosis not present

## 2023-09-24 DIAGNOSIS — L308 Other specified dermatitis: Secondary | ICD-10-CM | POA: Diagnosis not present

## 2023-09-25 DIAGNOSIS — N1832 Chronic kidney disease, stage 3b: Secondary | ICD-10-CM | POA: Diagnosis not present

## 2023-10-04 DIAGNOSIS — E559 Vitamin D deficiency, unspecified: Secondary | ICD-10-CM | POA: Diagnosis not present

## 2023-10-04 DIAGNOSIS — I129 Hypertensive chronic kidney disease with stage 1 through stage 4 chronic kidney disease, or unspecified chronic kidney disease: Secondary | ICD-10-CM | POA: Diagnosis not present

## 2023-10-04 DIAGNOSIS — I509 Heart failure, unspecified: Secondary | ICD-10-CM | POA: Diagnosis not present

## 2023-10-04 DIAGNOSIS — N1832 Chronic kidney disease, stage 3b: Secondary | ICD-10-CM | POA: Diagnosis not present

## 2023-10-04 DIAGNOSIS — N320 Bladder-neck obstruction: Secondary | ICD-10-CM | POA: Diagnosis not present

## 2023-10-05 DIAGNOSIS — R0902 Hypoxemia: Secondary | ICD-10-CM | POA: Diagnosis not present

## 2023-11-05 ENCOUNTER — Other Ambulatory Visit: Payer: Self-pay | Admitting: Cardiovascular Disease

## 2023-11-06 ENCOUNTER — Other Ambulatory Visit: Payer: Self-pay

## 2023-11-06 ENCOUNTER — Other Ambulatory Visit (HOSPITAL_COMMUNITY): Payer: Self-pay

## 2023-11-06 MED ORDER — RIVAROXABAN 15 MG PO TABS
15.0000 mg | ORAL_TABLET | Freq: Every day | ORAL | 1 refills | Status: DC
  Filled 2023-11-06: qty 90, 90d supply, fill #0
  Filled 2024-01-28 (×2): qty 90, 90d supply, fill #1

## 2023-11-06 MED ORDER — METOPROLOL SUCCINATE ER 25 MG PO TB24
25.0000 mg | ORAL_TABLET | Freq: Every day | ORAL | 1 refills | Status: AC
  Filled 2023-11-06: qty 90, 90d supply, fill #0
  Filled 2024-02-01: qty 90, 90d supply, fill #1

## 2023-11-06 NOTE — Telephone Encounter (Signed)
 Pt last saw Dr Loetta Ringer 09/11/23, last labs 09/25/23 Creat 1.93 at [facility] per [facility], age 83, weight 97.1kg, CrCl 39.13, based on CrCl pt is on appropriate dosage of Xarelto  15mg  every day for afib.   Will refill rx.

## 2023-11-07 ENCOUNTER — Other Ambulatory Visit: Payer: Self-pay

## 2023-11-07 ENCOUNTER — Other Ambulatory Visit (HOSPITAL_BASED_OUTPATIENT_CLINIC_OR_DEPARTMENT_OTHER): Payer: Self-pay

## 2023-11-12 ENCOUNTER — Other Ambulatory Visit (HOSPITAL_COMMUNITY): Payer: Self-pay

## 2023-11-15 DIAGNOSIS — R3915 Urgency of urination: Secondary | ICD-10-CM | POA: Diagnosis not present

## 2023-11-15 DIAGNOSIS — N471 Phimosis: Secondary | ICD-10-CM | POA: Diagnosis not present

## 2024-01-10 DIAGNOSIS — L720 Epidermal cyst: Secondary | ICD-10-CM | POA: Diagnosis not present

## 2024-01-10 DIAGNOSIS — D485 Neoplasm of uncertain behavior of skin: Secondary | ICD-10-CM | POA: Diagnosis not present

## 2024-01-10 DIAGNOSIS — C44622 Squamous cell carcinoma of skin of right upper limb, including shoulder: Secondary | ICD-10-CM | POA: Diagnosis not present

## 2024-01-10 DIAGNOSIS — Z85828 Personal history of other malignant neoplasm of skin: Secondary | ICD-10-CM | POA: Diagnosis not present

## 2024-01-10 DIAGNOSIS — L821 Other seborrheic keratosis: Secondary | ICD-10-CM | POA: Diagnosis not present

## 2024-01-10 DIAGNOSIS — L570 Actinic keratosis: Secondary | ICD-10-CM | POA: Diagnosis not present

## 2024-01-28 ENCOUNTER — Other Ambulatory Visit (HOSPITAL_BASED_OUTPATIENT_CLINIC_OR_DEPARTMENT_OTHER): Payer: Self-pay

## 2024-01-28 ENCOUNTER — Other Ambulatory Visit (HOSPITAL_COMMUNITY): Payer: Self-pay

## 2024-01-29 ENCOUNTER — Other Ambulatory Visit: Payer: Self-pay

## 2024-01-29 ENCOUNTER — Other Ambulatory Visit (HOSPITAL_COMMUNITY): Payer: Self-pay

## 2024-02-01 ENCOUNTER — Other Ambulatory Visit (HOSPITAL_COMMUNITY): Payer: Self-pay

## 2024-02-02 ENCOUNTER — Other Ambulatory Visit (HOSPITAL_COMMUNITY): Payer: Self-pay

## 2024-02-04 ENCOUNTER — Other Ambulatory Visit: Payer: Self-pay

## 2024-02-04 ENCOUNTER — Other Ambulatory Visit (HOSPITAL_COMMUNITY): Payer: Self-pay

## 2024-02-04 MED ORDER — ALLOPURINOL 100 MG PO TABS
100.0000 mg | ORAL_TABLET | Freq: Every day | ORAL | 4 refills | Status: AC
  Filled 2024-02-04: qty 90, 90d supply, fill #0

## 2024-02-06 DIAGNOSIS — H35373 Puckering of macula, bilateral: Secondary | ICD-10-CM | POA: Diagnosis not present

## 2024-02-06 DIAGNOSIS — Z961 Presence of intraocular lens: Secondary | ICD-10-CM | POA: Diagnosis not present

## 2024-02-06 DIAGNOSIS — H2512 Age-related nuclear cataract, left eye: Secondary | ICD-10-CM | POA: Diagnosis not present

## 2024-02-06 DIAGNOSIS — H52203 Unspecified astigmatism, bilateral: Secondary | ICD-10-CM | POA: Diagnosis not present

## 2024-02-07 ENCOUNTER — Other Ambulatory Visit (HOSPITAL_COMMUNITY): Payer: Self-pay

## 2024-02-10 ENCOUNTER — Encounter (HOSPITAL_COMMUNITY): Payer: Self-pay

## 2024-02-10 ENCOUNTER — Emergency Department (HOSPITAL_COMMUNITY)

## 2024-02-10 ENCOUNTER — Observation Stay (HOSPITAL_COMMUNITY)
Admission: EM | Admit: 2024-02-10 | Discharge: 2024-02-13 | Disposition: A | Discharge status: Home / Self Care | Attending: Internal Medicine | Admitting: Internal Medicine

## 2024-02-10 ENCOUNTER — Other Ambulatory Visit: Payer: Self-pay

## 2024-02-10 DIAGNOSIS — K7460 Unspecified cirrhosis of liver: Secondary | ICD-10-CM | POA: Diagnosis not present

## 2024-02-10 DIAGNOSIS — I129 Hypertensive chronic kidney disease with stage 1 through stage 4 chronic kidney disease, or unspecified chronic kidney disease: Secondary | ICD-10-CM | POA: Diagnosis not present

## 2024-02-10 DIAGNOSIS — R109 Unspecified abdominal pain: Secondary | ICD-10-CM | POA: Diagnosis present

## 2024-02-10 DIAGNOSIS — K8000 Calculus of gallbladder with acute cholecystitis without obstruction: Secondary | ICD-10-CM | POA: Diagnosis not present

## 2024-02-10 DIAGNOSIS — I2510 Atherosclerotic heart disease of native coronary artery without angina pectoris: Secondary | ICD-10-CM | POA: Insufficient documentation

## 2024-02-10 DIAGNOSIS — R1031 Right lower quadrant pain: Secondary | ICD-10-CM | POA: Diagnosis present

## 2024-02-10 DIAGNOSIS — Z79899 Other long term (current) drug therapy: Secondary | ICD-10-CM | POA: Diagnosis not present

## 2024-02-10 DIAGNOSIS — K819 Cholecystitis, unspecified: Principal | ICD-10-CM

## 2024-02-10 DIAGNOSIS — K429 Umbilical hernia without obstruction or gangrene: Secondary | ICD-10-CM | POA: Diagnosis not present

## 2024-02-10 DIAGNOSIS — M545 Low back pain, unspecified: Secondary | ICD-10-CM | POA: Insufficient documentation

## 2024-02-10 DIAGNOSIS — I480 Paroxysmal atrial fibrillation: Secondary | ICD-10-CM | POA: Diagnosis present

## 2024-02-10 DIAGNOSIS — K5730 Diverticulosis of large intestine without perforation or abscess without bleeding: Secondary | ICD-10-CM | POA: Diagnosis not present

## 2024-02-10 DIAGNOSIS — R6510 Systemic inflammatory response syndrome (SIRS) of non-infectious origin without acute organ dysfunction: Secondary | ICD-10-CM | POA: Diagnosis present

## 2024-02-10 DIAGNOSIS — R7989 Other specified abnormal findings of blood chemistry: Secondary | ICD-10-CM | POA: Diagnosis not present

## 2024-02-10 DIAGNOSIS — I4819 Other persistent atrial fibrillation: Secondary | ICD-10-CM | POA: Diagnosis not present

## 2024-02-10 DIAGNOSIS — K8020 Calculus of gallbladder without cholecystitis without obstruction: Secondary | ICD-10-CM | POA: Diagnosis not present

## 2024-02-10 DIAGNOSIS — Z7901 Long term (current) use of anticoagulants: Secondary | ICD-10-CM | POA: Diagnosis not present

## 2024-02-10 DIAGNOSIS — R1011 Right upper quadrant pain: Secondary | ICD-10-CM

## 2024-02-10 DIAGNOSIS — I10 Essential (primary) hypertension: Secondary | ICD-10-CM | POA: Diagnosis present

## 2024-02-10 DIAGNOSIS — K810 Acute cholecystitis: Principal | ICD-10-CM | POA: Diagnosis present

## 2024-02-10 DIAGNOSIS — Z951 Presence of aortocoronary bypass graft: Secondary | ICD-10-CM | POA: Insufficient documentation

## 2024-02-10 DIAGNOSIS — R0789 Other chest pain: Secondary | ICD-10-CM | POA: Diagnosis present

## 2024-02-10 DIAGNOSIS — R079 Chest pain, unspecified: Secondary | ICD-10-CM | POA: Diagnosis not present

## 2024-02-10 DIAGNOSIS — I517 Cardiomegaly: Secondary | ICD-10-CM | POA: Diagnosis not present

## 2024-02-10 DIAGNOSIS — N1832 Chronic kidney disease, stage 3b: Secondary | ICD-10-CM | POA: Diagnosis not present

## 2024-02-10 DIAGNOSIS — K838 Other specified diseases of biliary tract: Secondary | ICD-10-CM | POA: Diagnosis not present

## 2024-02-10 LAB — COMPREHENSIVE METABOLIC PANEL WITH GFR
ALT: 22 U/L (ref 0–44)
AST: 34 U/L (ref 15–41)
Albumin: 3.3 g/dL — ABNORMAL LOW (ref 3.5–5.0)
Alkaline Phosphatase: 97 U/L (ref 38–126)
Anion gap: 13 (ref 5–15)
BUN: 24 mg/dL — ABNORMAL HIGH (ref 8–23)
CO2: 21 mmol/L — ABNORMAL LOW (ref 22–32)
Calcium: 9.5 mg/dL (ref 8.9–10.3)
Chloride: 102 mmol/L (ref 98–111)
Creatinine, Ser: 2.11 mg/dL — ABNORMAL HIGH (ref 0.61–1.24)
GFR, Estimated: 30 mL/min — ABNORMAL LOW (ref >60)
Glucose, Bld: 166 mg/dL — ABNORMAL HIGH (ref 70–99)
Potassium: 4 mmol/L (ref 3.5–5.1)
Sodium: 136 mmol/L (ref 135–145)
Total Bilirubin: 1.9 mg/dL — ABNORMAL HIGH (ref 0.0–1.2)
Total Protein: 7.3 g/dL (ref 6.5–8.1)

## 2024-02-10 LAB — URINALYSIS, ROUTINE W REFLEX MICROSCOPIC
Bilirubin Urine: NEGATIVE
Glucose, UA: NEGATIVE mg/dL
Hgb urine dipstick: NEGATIVE
Ketones, ur: NEGATIVE mg/dL
Leukocytes,Ua: NEGATIVE
Nitrite: NEGATIVE
Protein, ur: NEGATIVE mg/dL
Specific Gravity, Urine: 1.015 (ref 1.005–1.030)
pH: 5 (ref 5.0–8.0)

## 2024-02-10 LAB — CBC
HCT: 43.8 % (ref 39.0–52.0)
Hemoglobin: 13.7 g/dL (ref 13.0–17.0)
MCH: 28.7 pg (ref 26.0–34.0)
MCHC: 31.3 g/dL (ref 30.0–36.0)
MCV: 91.6 fL (ref 80.0–100.0)
Platelets: 192 K/uL (ref 150–400)
RBC: 4.78 MIL/uL (ref 4.22–5.81)
RDW: 14.9 % (ref 11.5–15.5)
WBC: 13.6 K/uL — ABNORMAL HIGH (ref 4.0–10.5)
nRBC: 0 % (ref 0.0–0.2)

## 2024-02-10 LAB — TROPONIN I (HIGH SENSITIVITY)
Troponin I (High Sensitivity): 25 ng/L — ABNORMAL HIGH (ref <18)
Troponin I (High Sensitivity): 24 ng/L — ABNORMAL HIGH (ref <18)

## 2024-02-10 LAB — LIPASE, BLOOD: Lipase: 40 U/L (ref 11–51)

## 2024-02-10 MED ORDER — SODIUM CHLORIDE 0.9 % IV SOLN
2.0000 g | Freq: Once | INTRAVENOUS | Status: AC
  Administered 2024-02-11 (×2): 2 g via INTRAVENOUS
  Filled 2024-02-10: qty 20

## 2024-02-10 MED ORDER — FAMOTIDINE IN NACL 20-0.9 MG/50ML-% IV SOLN
20.0000 mg | Freq: Once | INTRAVENOUS | Status: AC
  Administered 2024-02-10: 20 mg via INTRAVENOUS
  Filled 2024-02-10: qty 50

## 2024-02-10 MED ORDER — ACETAMINOPHEN 325 MG PO TABS: 650.0000 mg | ORAL_TABLET | Freq: Four times a day (QID) | ORAL | Status: DC | PRN

## 2024-02-10 MED ORDER — METRONIDAZOLE 500 MG/100ML IV SOLN
500.0000 mg | Freq: Two times a day (BID) | INTRAVENOUS | Status: DC
  Administered 2024-02-11 (×2): 500 mg via INTRAVENOUS
  Filled 2024-02-10: qty 100

## 2024-02-10 MED ORDER — FENTANYL CITRATE PF 50 MCG/ML IJ SOSY
25.0000 ug | PREFILLED_SYRINGE | INTRAMUSCULAR | Status: DC | PRN
  Administered 2024-02-11 (×2): 25 ug via INTRAVENOUS
  Filled 2024-02-10: qty 1

## 2024-02-10 MED ORDER — MORPHINE SULFATE (PF) 4 MG/ML IV SOLN
4.0000 mg | Freq: Once | INTRAVENOUS | Status: AC
  Administered 2024-02-10: 4 mg via INTRAVENOUS
  Filled 2024-02-10: qty 1

## 2024-02-10 MED ORDER — SODIUM CHLORIDE 0.9 % IV BOLUS
1000.0000 mL | Freq: Once | INTRAVENOUS | Status: AC
  Administered 2024-02-10: 1000 mL via INTRAVENOUS

## 2024-02-10 MED ORDER — SODIUM CHLORIDE 0.9 % IV SOLN: 1.0000 g | Freq: Once | INTRAVENOUS | Status: DC

## 2024-02-10 MED ORDER — METRONIDAZOLE 500 MG/100ML IV SOLN
500.0000 mg | Freq: Once | INTRAVENOUS | Status: AC
  Administered 2024-02-11 (×2): 500 mg via INTRAVENOUS
  Filled 2024-02-10: qty 100

## 2024-02-10 MED ORDER — ACETAMINOPHEN 650 MG RE SUPP: 650.0000 mg | Freq: Four times a day (QID) | RECTAL | Status: DC | PRN

## 2024-02-10 MED ORDER — SODIUM CHLORIDE 0.9 % IV SOLN
2.0000 g | INTRAVENOUS | Status: DC
  Administered 2024-02-11 (×2): 2 g via INTRAVENOUS
  Filled 2024-02-10 (×2): qty 20

## 2024-02-10 MED ORDER — ONDANSETRON HCL 4 MG/2ML IJ SOLN: 4.0000 mg | Freq: Four times a day (QID) | INTRAMUSCULAR | Status: DC | PRN

## 2024-02-10 MED ORDER — NALOXONE HCL 0.4 MG/ML IJ SOLN: 0.4000 mg | INTRAMUSCULAR | Status: DC | PRN

## 2024-02-10 NOTE — ED Triage Notes (Signed)
 Pt c.o upper abd pain and lower back pain that started suddenly at 6am this morning. Denies n/v. No new urinary symptoms. Last Bm was this morning.

## 2024-02-10 NOTE — ED Provider Notes (Signed)
  Provider Note MRN:  [id number]  Arrival date & time: 02/10/24    ED Course and Medical Decision Making  Assumed care of patient at sign-out or upon transfer.  Abdominal pain with imaging equivocal for cholecystitis, providing antibiotics, general surgery consulted, plans for hospitalist admission.  Procedures  Final Clinical Impressions(s) / ED Diagnoses  No diagnosis found.  ED Discharge Orders     None       Discharge Instructions   None     Ozell HERO. Theadore, MD [facility] [email or web address]    Theadore Ozell HERO, MD 02/11/24 [phone]

## 2024-02-10 NOTE — H&P (Signed)
 History and Physical      Shane Espinoza FMW:[id number] DOB: April 30, 1941 DOA: 02/10/2024; DOS: 02/10/2024  PCP: Shane Espinoza LABOR, MD *** Patient coming from: home ***  I have personally briefly reviewed patient's old medical records in [facility]  Chief Complaint: ***  HPI: Shane Espinoza is a 83 y.o. male with medical history significant for *** who is admitted to [facility] on 02/10/2024 with *** after presenting from home*** to [facility] ED complaining of ***.    ***       ***   ED Course:  Vital signs in the ED were notable for the following: ***  Labs were notable for the following: ***  Per my interpretation, EKG in ED demonstrated the following:  ***  Imaging in the ED, per corresponding formal radiology read, was notable for the following:  ***  EDP d/w on-call general surgery, Dr. Polly, who recommended admission to the Hospitalists, iv abx, and conveyed that general surgery will formally consult.    While in the ED, the following were administered: ***  Subsequently, the patient was admitted  ***  ***red    Review of Systems: As per HPI otherwise 10 point review of systems negative.   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 He has never used smokeless tobacco. He reports that he does not drink alcohol and does not use drugs.   Allergies  Allergen Reactions   Lisinopril     Other reaction(s): cough    Family History  Problem Relation Age of Onset   CVA Father    Heart attack Maternal Grandmother    Heart attack Maternal Grandfather    Heart murmur Sister    Hypertension Son     Family history reviewed and not pertinent ***   Prior to Admission medications   Medication Sig Start Date End Date Taking? Authorizing Provider  allopurinol  (ZYLOPRIM ) 100 MG tablet Take 1 tablet (100 mg total) by mouth daily. 02/04/24     atorvastatin  (LIPITOR) 40 MG tablet Take 1 tablet (40 mg total) by mouth at bedtime. 08/09/23   Burnard Debby LABOR, MD  Cholecalciferol  (VITAMIN D -3) 5000 UNIT/ML LIQD 1 capsule    [provider]  furosemide  (LASIX ) 40 MG tablet Take 1-2 tablets (40-80 mg total) by mouth daily. 05/01/23   Shane Espinoza LABOR, MD  metoprolol  succinate (TOPROL -XL) 25 MG 24 hr tablet Take 1 tablet (25 mg total) by mouth daily. 11/06/23   Burnard Debby LABOR, MD  Rivaroxaban  (XARELTO ) 15 MG TABS tablet Take 1 tablet (15 mg total) by mouth daily with supper. 11/06/23  Burnard Debby LABOR, MD  spironolactone  (ALDACTONE ) 25 MG tablet Take 0.5 tablets (12.5 mg total) by mouth daily. 06/15/21   Burnard Debby LABOR, MD  valsartan  (DIOVAN ) 80 MG tablet Take 1 tablet (80 mg total) by mouth daily. 05/24/23     vitamin B-12 (CYANOCOBALAMIN ) 1000 MCG tablet Take 1,000 mcg by mouth daily.    [provider]  Vitamin D , Ergocalciferol , (DRISDOL) 1.25 MG (50000 UNIT) CAPS capsule Take 50,000 Units by mouth 3 (three) times a week.    [provider]     Objective    Physical Exam: Vitals:   02/10/24 2030 02/10/24 2130 02/10/24 2200 02/10/24 2256  BP: (!) 147/71 (!) 143/75 (!) 143/63   Pulse: 73 92 (!) 50   Resp: (!) 21 (!) 25 (!) 23   Temp:    97.6 F (36.4 C)  TempSrc:      SpO2:  100% 93% 100%     General: appears to be stated age; alert, oriented Skin: warm, dry, no rash Head:  AT/[place] Mouth:  Oral mucosa membranes appear moist, normal dentition Neck: supple; trachea midline Heart:  RRR; did not appreciate any M/R/G Lungs: CTAB, did not appreciate any wheezes, rales, or rhonchi Abdomen: + BS; soft, ND, NT Vascular: 2+ pedal pulses b/l; 2+ radial pulses b/l Extremities: no peripheral edema, no muscle wasting Neuro: strength and sensation intact in upper and lower extremities b/l ***   *** Neuro: 5/5 strength of the proximal and distal flexors and extensors of the upper and lower extremities bilaterally; sensation intact in upper and lower extremities b/l; cranial nerves II through XII grossly intact; no pronator drift; no evidence suggestive of slurred speech, dysarthria, or facial droop; Normal muscle tone. No tremors.  *** Neuro: In the setting of the patient's current mental status and associated inability to follow instructions, unable to perform full neurologic exam at this time.  As such, assessment of strength, sensation, and cranial nerves is limited at this time. Patient noted to spontaneously move all 4 extremities. No tremors.  ***    Labs on Admission: I have personally reviewed following labs and imaging studies  CBC: Recent Labs  Lab 02/10/24 1838  WBC 13.6*  HGB 13.7  HCT 43.8  MCV 91.6  PLT 192   Basic Metabolic Panel: Recent Labs  Lab 02/10/24 1838  NA 136  K 4.0  CL 102  CO2 21*  GLUCOSE 166*  BUN 24*  CREATININE 2.11*  CALCIUM  9.5   GFR: CrCl cannot be calculated (Unknown ideal weight.). Liver Function Tests: Recent Labs  Lab 02/10/24 1838  AST 34  ALT 22  ALKPHOS 97  BILITOT 1.9*  PROT 7.3  ALBUMIN 3.3*   Recent Labs  Lab 02/10/24 1838  LIPASE 40   No results for input(s): AMMONIA in the last 168 hours. Coagulation Profile: No results for input(s): INR, PROTIME in the last 168 hours. Cardiac  Enzymes: No results for input(s): CKTOTAL, CKMB, CKMBINDEX, TROPONINI in the last 168 hours. BNP (last 3 results) No results for input(s): PROBNP in the last 8760 hours. HbA1C: No results for input(s): HGBA1C in the last 72 hours. CBG: No results for input(s): GLUCAP in the last 168 hours. Lipid Profile: No results for input(s): CHOL, HDL, LDLCALC, TRIG, CHOLHDL, LDLDIRECT in the last 72 hours. Thyroid  Function Tests: No results for input(s): TSH, T4TOTAL, FREET4, T3FREE, THYROIDAB in the last 72 hours. Anemia Panel: No results for input(s): VITAMINB12, FOLATE, FERRITIN, TIBC, IRON, RETICCTPCT in the last 72 hours.  Urine analysis:    Component Value Date/Time   COLORURINE YELLOW 02/10/2024 2036   APPEARANCEUR CLEAR 02/10/2024 2036   LABSPEC 1.015 02/10/2024 2036   PHURINE 5.0 02/10/2024 2036   GLUCOSEU NEGATIVE 02/10/2024 2036   HGBUR NEGATIVE 02/10/2024 2036   BILIRUBINUR NEGATIVE 02/10/2024 2036   KETONESUR NEGATIVE 02/10/2024 2036   PROTEINUR NEGATIVE 02/10/2024 2036   UROBILINOGEN 1.0 03/09/2011 0849   NITRITE NEGATIVE 02/10/2024 2036   LEUKOCYTESUR NEGATIVE 02/10/2024 2036    Radiological Exams on Admission: US  Abdomen Limited RUQ (LIVER/GB) Result Date: 02/10/2024 CLINICAL DATA:  Right upper quadrant pain EXAM: ULTRASOUND ABDOMEN LIMITED RIGHT UPPER QUADRANT COMPARISON:  CT abdomen and pelvis 02/10/2024 FINDINGS: Gallbladder: Multiple gallstones are present measuring up to 1.4 cm. There is gallbladder wall thickening measuring up to 4.5 mm. Gallbladder sludge is also present. There is no pericholecystic fluid. Sonographic Beverley sign is negative. Common bile duct: Diameter: 5.7 mm Liver: No focal lesion identified. Lobular contour. Mild increase in parenchymal echogenicity. Portal vein is patent on color Doppler imaging with normal direction of blood flow towards the liver. Other: None. IMPRESSION: 1. Cholelithiasis with  gallbladder wall thickening. Sonographic Beverley sign is negative. Findings are equivocal for acute cholecystitis. 2. Lobular contour of the liver with mild increase in parenchymal echogenicity. Findings are nonspecific but can be seen in the setting of cirrhosis. Electronically Signed   By: Greig Pique M.D.   On: 02/10/2024 22:52   CT ABDOMEN PELVIS WO CONTRAST Result Date: 02/10/2024 CLINICAL DATA:  Abdominal pain, acute, nonlocalized. EXAM: CT ABDOMEN AND PELVIS WITHOUT CONTRAST TECHNIQUE: Multidetector CT imaging of the abdomen and pelvis was performed following the standard protocol without IV contrast. RADIATION DOSE REDUCTION: This exam was performed according to the departmental dose-optimization program which includes automated exposure control, adjustment of the mA and/or kV according to patient size and/or use of iterative reconstruction technique. COMPARISON:  CT abdomen and pelvis without contrast 10/21/2021. FINDINGS: Lower chest: Lung bases show mild subpleural reticulation without active infiltrate. Cardiomegaly. There is no pericardial effusion. CABG changes with heavy calcification of the native coronary arteries. There is a small hiatal hernia. Hepatobiliary: Loss of fine detail due to a breathing motion. There is a cirrhotic liver configuration, with capsular nodularity primarily in the left lobe. The portal vein is normal caliber. No liver masses seen through the breathing motion. There are multiple small layering gallstones again noted. There is no overt wall thickening but there may be pericholecystic edema, versus exaggerated pericholecystic interstitium due to respiratory motion. Ultrasound can be obtained if there is clinical uncertainty as to possible cholecystitis. There is no pericholecystic fluid or biliary dilatation. Pancreas: Partially atrophic. Otherwise unremarkable without contrast. Spleen: No abnormality is seen through the breathing motion. No splenomegaly. Adrenals/Urinary  Tract: No right adrenal mass. Chronic calcifications in the lateral right adrenal limb. There is chronic nodular thickening and calcifications of the left adrenal gland, stable. Right kidney again noted partially atrophic. There are Bosniak 1 cysts, in the lower lateral right kidney measuring 1.6 cm and 5.3 Hounsfield units, in the outer mid left kidney measuring 1.6 cm and -1.1 Hounsfield units. No follow-up imaging is recommended. This was seen previously. There is no other contour deforming abnormality of either unenhanced kidney. Similar perinephric fat stranding. There is no urinary stone or obstruction. There is a bladder base impression by the enlarged prostate. There is mild bladder thickening versus nondistention. Query bladder hypertrophy or cystitis. Favor chronic bladder outlet obstruction. Stomach/Bowel: No dilatation or wall thickening including  the appendix. Scattered uncomplicated sigmoid diverticulosis. Vascular/Lymphatic: Moderate patchy aortoiliac and branch vessel atherosclerosis. No AAA. No adenopathy. Reproductive: Enlarged prostate is 5.7 cm transverse, previously 5.5 cm. Other: Umbilical and bilateral inguinal fat hernias, right inguinal fat hernia is larger. No entrapped bowel is seen. No free fluid, free hemorrhage, free air or localizing inflammatory process. Musculoskeletal: Osteopenia and degenerative change lumbar spine with slight dextroscoliosis. Mild hip DJD. Bilateral SI joint bridging osteophytes. No acute or significant osseous findings. IMPRESSION: 1. Cholelithiasis with possible pericholecystic edema versus exaggerated pericholecystic interstitium due to respiratory motion. Ultrasound can be obtained if there is clinical uncertainty as to possible cholecystitis. 2. Cirrhotic liver. No liver masses seen through the breathing motion. No splenomegaly or ascites. 3. Cardiomegaly with CABG changes. 4. Small hiatal hernia. 5. Aortic and coronary artery atherosclerosis. 6.  Prostatomegaly with bladder base impression and mild bladder thickening versus nondistention. Query bladder hypertrophy or cystitis. Favor chronic bladder outlet obstruction. 7. Umbilical and bilateral inguinal fat hernias. 8. Diverticulosis without evidence of diverticulitis. 9. Osteopenia and degenerative change. Aortic Atherosclerosis (ICD10-I70.0). Electronically Signed   By: Francis Quam M.D.   On: 02/10/2024 21:27   DG Chest 2 View Result Date: 02/10/2024 CLINICAL DATA:  Chest pain EXAM: CHEST - 2 VIEW COMPARISON:  Chest x-ray 01/10/2021 FINDINGS: Heart is enlarged. The sternotomy wires are present. There is no focal lung infiltrate, pleural effusion or pneumothorax. No acute fractures are seen. IMPRESSION: Cardiomegaly. No acute cardiopulmonary process. Electronically Signed   By: Greig Pique M.D.   On: 02/10/2024 19:02      Assessment/Plan   Principal Problem:   Abdominal pain   ***            ***                  ***                   ***                  ***                  ***                  ***                   ***                  ***                  ***                  ***                  ***                 ***                ***  DVT prophylaxis: SCD's ***  Code Status: Full code*** Family Communication: none*** Disposition Plan: Per Rounding Team Consults called: EDP d/w on-call general surgery, Dr. Polly, who recommended admission to the Hospitalists, iv abx, and conveyed that general surgery will formally consult.  ***;  Admission status: ***     I SPENT GREATER THAN 75 *** MINUTES IN CLINICAL CARE TIME/MEDICAL DECISION-MAKING IN COMPLETING THIS ADMISSION.      Eva NOVAK Henderson Frampton DO [facility]  From 7PM -  7AM   02/10/2024, 11:49 PM   ***

## 2024-02-10 NOTE — ED Notes (Signed)
 Patient transported to Ultrasound

## 2024-02-10 NOTE — ED Provider Notes (Signed)
 [place] [facility] Provider Note   CSN: [id number] Arrival date & time: 02/10/24  8169     Patient presents with: Abdominal Pain and Back Pain   Shane Espinoza is a 83 y.o. male history of A-fib on Xarelto , hypertension, CAD status post CABG here presenting with upper abdominal pain and chest pressure.  He states that he ate some barbecue yesterday as well as some ice cream.  His wife had some abdominal cramps and drank some tea and felt better.  He started having cramps this morning and she tried to give him some tea but he did not feel better.  Also the pain radiated to his back.  He denies any urinary symptoms.  He states that he has some nausea and chest pressure as well.  Denies any chest pain.   The history is provided by the patient.       Prior to Admission medications   Medication Sig Start Date End Date Taking? Authorizing Provider  allopurinol  (ZYLOPRIM ) 100 MG tablet Take 1 tablet (100 mg total) by mouth daily. 02/04/24     atorvastatin  (LIPITOR) 40 MG tablet Take 1 tablet (40 mg total) by mouth at bedtime. 08/09/23   Burnard Debby LABOR, MD  Cholecalciferol  (VITAMIN D -3) 5000 UNIT/ML LIQD 1 capsule    [provider]  furosemide  (LASIX ) 40 MG tablet Take 1-2 tablets (40-80 mg total) by mouth daily. 05/01/23   Charlott Dorn LABOR, MD  metoprolol  succinate (TOPROL -XL) 25 MG 24 hr tablet Take 1 tablet (25 mg total) by mouth daily. 11/06/23   Burnard Debby LABOR, MD  Rivaroxaban  (XARELTO ) 15 MG TABS tablet Take 1 tablet (15 mg total) by mouth daily with supper. 11/06/23   Burnard Debby LABOR, MD  spironolactone  (ALDACTONE ) 25 MG tablet Take 0.5 tablets (12.5 mg total) by mouth daily. 06/15/21   Burnard Debby LABOR, MD  valsartan  (DIOVAN ) 80 MG tablet Take 1 tablet (80 mg total) by mouth daily. 05/24/23     vitamin B-12 (CYANOCOBALAMIN ) 1000 MCG tablet Take 1,000 mcg by mouth daily.    [provider]  Vitamin D , Ergocalciferol , (DRISDOL)  1.25 MG (50000 UNIT) CAPS capsule Take 50,000 Units by mouth 3 (three) times a week.    [provider]    Allergies: Lisinopril    Review of Systems  Gastrointestinal:  Positive for abdominal pain.  Musculoskeletal:  Positive for back pain.  All other systems reviewed and are negative.   Updated Vital Signs BP (!) 159/71 (BP Location: Right Arm)   Pulse 93   Temp 97.7 F (36.5 C) (Oral)   Resp 14   SpO2 97%   Physical Exam Vitals and nursing note reviewed.  HENT:     Head: Normocephalic.     Mouth/Throat:     Mouth: Mucous membranes are moist.  Eyes:     Extraocular Movements: Extraocular movements intact.     Pupils: Pupils are equal, round, and reactive to light.  Cardiovascular:     Rate and Rhythm: Normal rate and regular rhythm.     Heart sounds: Normal heart sounds.  Pulmonary:     Effort: Pulmonary effort is normal.     Breath sounds: Normal breath sounds.  Abdominal:     General: Abdomen is flat.     Comments: Small ventral hernia, mildly tender.  No obvious incarcerated hernia  Skin:    General: Skin is warm.     Capillary Refill: Capillary refill takes less than 2  seconds.  Neurological:     General: No focal deficit present.     Mental Status: He is alert.  Psychiatric:        Mood and Affect: Mood normal.        Behavior: Behavior normal.     (all labs ordered are listed, but only abnormal results are displayed) Labs Reviewed  COMPREHENSIVE METABOLIC PANEL WITH GFR - Abnormal; Notable for the following components:      Result Value   CO2 21 (*)    Glucose, Bld 166 (*)    BUN 24 (*)    Creatinine, Ser 2.11 (*)    Albumin 3.3 (*)    Total Bilirubin 1.9 (*)    GFR, Estimated 30 (*)    All other components within normal limits  CBC - Abnormal; Notable for the following components:   WBC 13.6 (*)    All other components within normal limits  TROPONIN I (HIGH SENSITIVITY) - Abnormal; Notable for the following components:   Troponin I  (High Sensitivity) 25 (*)    All other components within normal limits  LIPASE, BLOOD  URINALYSIS, ROUTINE W REFLEX MICROSCOPIC  TROPONIN I (HIGH SENSITIVITY)    EKG: EKG Interpretation Date/Time:  Sunday February 10 2024 18:42:53 EDT Ventricular Rate:  73 PR Interval:    QRS Duration:  86 QT Interval:  396 QTC Calculation: 436 R Axis:   116  Text Interpretation: Atrial fibrillation Low voltage QRS Left posterior fascicular block Cannot rule out Anterior infarct , age undetermined Abnormal ECG When compared with ECG of 11-Sep-2023 08:33, PREVIOUS ECG IS PRESENT Confirmed by Patt Alm DEL ([id number]) on 02/10/2024 7:24:45 PM  Radiology: [facility] Chest 2 View Result Date: 02/10/2024 CLINICAL DATA:  Chest pain EXAM: CHEST - 2 VIEW COMPARISON:  Chest x-ray 01/10/2021 FINDINGS: Heart is enlarged. The sternotomy wires are present. There is no focal lung infiltrate, pleural effusion or pneumothorax. No acute fractures are seen. IMPRESSION: Cardiomegaly. No acute cardiopulmonary process. Electronically Signed   By: Greig Pique M.D.   On: 02/10/2024 19:02     Procedures   Medications Ordered in the ED  famotidine  (PEPCID ) IVPB 20 mg premix (has no administration in time range)  morphine  (PF) 4 MG/ML injection 4 mg (has no administration in time range)  sodium chloride  0.9 % bolus 1,000 mL (has no administration in time range)                                    Medical Decision Making Shane Espinoza is a 83 y.o. male here with abdominal pain and chest pressure.  I think likely gastritis.  Given he has a history of CAD with CABG, also consider ACS as well.  Plan to get CBC and CMP and lipase and UA and troponin x 2.  Will also get CT abdomen pelvis.  9:52 PM I reviewed patient's labs and independently interpreted imaging studies.  Patient has a mild AKI with creatinine of 2.1.  CT abdomen pelvis showed cholelithiasis with possible pericholecystic edema.  Patient is not focally tender on the  right upper quadrant but just has generalized tenderness.  Patient also does not have elevated LFTs.  Will get dedicated right upper quadrant ultrasound.  11:11 PM Ultrasound showed possible cholecystitis.  Consulted Dr. Polly from general surgery.  He recommend IV antibiotics and he will see the patient.  He also recommend medicine admission.  11:29 PM  Hospitalist admission pending. Signed out to Dr. Theadore in the ED   Problems Addressed: Cholecystitis: acute illness or injury  Amount and/or Complexity of Data Reviewed Labs: ordered. Decision-making details documented in ED Course. Radiology: ordered and independent interpretation performed. Decision-making details documented in ED Course.  Risk Prescription drug management.     Final diagnoses:  None    ED Discharge Orders     None          Patt Alm Macho, MD 02/10/24 2330

## 2024-02-11 ENCOUNTER — Encounter (HOSPITAL_COMMUNITY): Payer: Self-pay | Admitting: Internal Medicine

## 2024-02-11 DIAGNOSIS — R1011 Right upper quadrant pain: Secondary | ICD-10-CM | POA: Diagnosis not present

## 2024-02-11 DIAGNOSIS — R6510 Systemic inflammatory response syndrome (SIRS) of non-infectious origin without acute organ dysfunction: Secondary | ICD-10-CM | POA: Diagnosis present

## 2024-02-11 DIAGNOSIS — R7989 Other specified abnormal findings of blood chemistry: Secondary | ICD-10-CM | POA: Diagnosis present

## 2024-02-11 DIAGNOSIS — E119 Type 2 diabetes mellitus without complications: Secondary | ICD-10-CM | POA: Diagnosis not present

## 2024-02-11 DIAGNOSIS — R933 Abnormal findings on diagnostic imaging of other parts of digestive tract: Secondary | ICD-10-CM | POA: Diagnosis not present

## 2024-02-11 DIAGNOSIS — N1832 Chronic kidney disease, stage 3b: Secondary | ICD-10-CM | POA: Diagnosis present

## 2024-02-11 DIAGNOSIS — K810 Acute cholecystitis: Secondary | ICD-10-CM | POA: Diagnosis present

## 2024-02-11 LAB — CBC WITH DIFFERENTIAL/PLATELET
Abs Immature Granulocytes: 0.11 K/uL — ABNORMAL HIGH (ref 0.00–0.07)
Basophils Absolute: 0.1 K/uL (ref 0.0–0.1)
Basophils Relative: 0 %
Eosinophils Absolute: 0 K/uL (ref 0.0–0.5)
Eosinophils Relative: 0 %
HCT: 39.9 % (ref 39.0–52.0)
Hemoglobin: 12.7 g/dL — ABNORMAL LOW (ref 13.0–17.0)
Immature Granulocytes: 1 %
Lymphocytes Relative: 13 %
Lymphs Abs: 2 K/uL (ref 0.7–4.0)
MCH: 28.7 pg (ref 26.0–34.0)
MCHC: 31.8 g/dL (ref 30.0–36.0)
MCV: 90.1 fL (ref 80.0–100.0)
Monocytes Absolute: 2.1 K/uL — ABNORMAL HIGH (ref 0.1–1.0)
Monocytes Relative: 13 %
Neutro Abs: 11.5 K/uL — ABNORMAL HIGH (ref 1.7–7.7)
Neutrophils Relative %: 73 %
Platelets: 159 K/uL (ref 150–400)
RBC: 4.43 MIL/uL (ref 4.22–5.81)
RDW: 14.9 % (ref 11.5–15.5)
WBC: 15.8 K/uL — ABNORMAL HIGH (ref 4.0–10.5)
nRBC: 0 % (ref 0.0–0.2)

## 2024-02-11 LAB — MAGNESIUM
Magnesium: 1.9 mg/dL (ref 1.7–2.4)
Magnesium: 1.7 mg/dL (ref 1.7–2.4)

## 2024-02-11 LAB — COMPREHENSIVE METABOLIC PANEL WITH GFR
ALT: 18 U/L (ref 0–44)
AST: 29 U/L (ref 15–41)
Albumin: 2.7 g/dL — ABNORMAL LOW (ref 3.5–5.0)
Alkaline Phosphatase: 77 U/L (ref 38–126)
Anion gap: 11 (ref 5–15)
BUN: 26 mg/dL — ABNORMAL HIGH (ref 8–23)
CO2: 20 mmol/L — ABNORMAL LOW (ref 22–32)
Calcium: 8.9 mg/dL (ref 8.9–10.3)
Chloride: 105 mmol/L (ref 98–111)
Creatinine, Ser: 2 mg/dL — ABNORMAL HIGH (ref 0.61–1.24)
GFR, Estimated: 32 mL/min — ABNORMAL LOW (ref >60)
Glucose, Bld: 164 mg/dL — ABNORMAL HIGH (ref 70–99)
Potassium: 4.3 mmol/L (ref 3.5–5.1)
Sodium: 136 mmol/L (ref 135–145)
Total Bilirubin: 1.2 mg/dL (ref 0.0–1.2)
Total Protein: 6.1 g/dL — ABNORMAL LOW (ref 6.5–8.1)

## 2024-02-11 LAB — PROTIME-INR
INR: 1.8 — ABNORMAL HIGH (ref 0.8–1.2)
Prothrombin Time: 21.8 s — ABNORMAL HIGH (ref 11.4–15.2)

## 2024-02-11 MED ORDER — METOPROLOL SUCCINATE ER 25 MG PO TB24
25.0000 mg | ORAL_TABLET | Freq: Every day | ORAL | Status: DC
  Administered 2024-02-11 – 2024-02-13 (×6): 25 mg via ORAL
  Filled 2024-02-11 (×3): qty 1

## 2024-02-11 MED ORDER — VITAMIN B-12 1000 MCG PO TABS
1000.0000 ug | ORAL_TABLET | ORAL | Status: DC
  Administered 2024-02-11 – 2024-02-13 (×4): 1000 ug via ORAL
  Filled 2024-02-11 (×2): qty 1

## 2024-02-11 NOTE — Progress Notes (Signed)
 PROGRESS NOTE    Shane Espinoza  FMW:[id number] DOB: 11-05-1940 DOA: 02/10/2024 PCP: Charlott Dorn LABOR, MD    Brief Narrative:    Shane Espinoza is a 83 y.o. 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   Assessment and Plan:  RUQ abdominal discomfort: Single episode of crampy elevated quadrant abdominal discomfort earlier this evening.  Ensuing CT abdomen/pelvis as well as right quadrant ultrasound is equivocal for acute cholecystitis.   -general surgery consult:   Given resolution of abdominal pain and he is NT on exam I do not think he needs emergent surgery. I would recommend trailing cld and see how he does with this. Cont to trend daily labs. If he has any recurrence of pain with diet advancement, lab changes (rising leukocytosis or elevated LFT's) or hemodynamic changes we could consider HIDA to more definitively rule in vs rule out Cholecystitis (understanding with Cirrhosis, this may give false positive). If needs HIDA, would engage Cardiology to determine peri-operative risk stratification incase HIDA is positive and to help guide Lap Chole vs Perc Chole. If improves with conservative management, would recommend low fat diet at d/c and outpatient f/u.   -WBCs trending up   Elevated troponin: Minimal elevation in high-sensitivity troponin I, with initial value 25, with repeat troponin trending down to 24.   -not impressive in light of CKD and lack of chest pain -if surgery needed will consult cards for optimization         Paroxysmal atrial fibrillation: Documented history of such. In setting of CHA2DS2-VASc score of  4 -hold xarelto  for now in  case of surgery     ? Cirrhosis -outpatient referral to GI for monitoring        Essential Hypertension: -resume meds as needed         CKD Stage 3B: Documented history of such, with baseline creatinine 1.7-2.1 -follows with Dr. Dolan         DVT prophylaxis: SCDs Start: 02/10/24 2346    Code Status: Full Code Family Communication: wife at bedside  Disposition Plan:  Level of care: Telemetry Medical Status is: Observation     Consultants:  GS   Subjective: No current pain  Objective: Vitals:   02/11/24 0700 02/11/24 0800 02/11/24 0900 02/11/24 1100  BP: 121/66 128/62 (!) 119/59 131/62  Pulse: (!) 56 73 77 72  Resp: 18 (!) 25 (!) 24 (!) 26  Temp:   98.1 F (36.7 C) 98 F (36.7 C)  TempSrc:    Oral  SpO2: 96% 100% 98% 100%   No intake or output data in the 24 hours ending 02/11/24 1150 There were no vitals filed for this visit.  Examination:   General: Appearance:    Obese male in no acute distress     Lungs:     respirations unlabored  Heart:    Normal heart rate.     MS:   All extremities are intact.    Neurologic:   Awake, alert       Data Reviewed: I have personally reviewed following labs and imaging studies  CBC: Recent Labs  Lab 02/10/24 1838 02/11/24 0500  WBC 13.6* 15.8*  NEUTROABS  --  11.5*  HGB 13.7 12.7*  HCT 43.8 39.9  MCV 91.6 90.1  PLT 192 159   Basic Metabolic Panel: Recent Labs  Lab 02/10/24 1838 02/10/24 2106 02/11/24 0500  NA 136  --  136  K 4.0  --  4.3  CL 102  --  105  CO2 21*  --  20*  GLUCOSE 166*  --  164*  BUN 24*  --  26*  CREATININE 2.11*  --  2.00*  CALCIUM  9.5  --  8.9  MG  --  1.9 1.7   GFR: CrCl cannot be calculated (Unknown ideal weight.). Liver Function Tests: Recent Labs  Lab 02/10/24 1838 02/11/24 0500  AST 34 29  ALT 22 18  ALKPHOS 97 77  BILITOT 1.9* 1.2  PROT 7.3 6.1*  ALBUMIN 3.3* 2.7*   Recent Labs  Lab 02/10/24 1838  LIPASE 40   No results for input(s):  AMMONIA in the last 168 hours. Coagulation Profile: Recent Labs  Lab 02/11/24 0500  INR 1.8*   Cardiac Enzymes: No results for input(s): CKTOTAL, CKMB, CKMBINDEX, TROPONINI in the last 168 hours. BNP (last 3 results) No results for input(s): PROBNP in the last 8760 hours. HbA1C: No results for input(s): HGBA1C in the last 72 hours. CBG: No results for input(s): GLUCAP in the last 168 hours. Lipid Profile: No results for input(s): CHOL, HDL, LDLCALC, TRIG, CHOLHDL, LDLDIRECT in the last 72 hours. Thyroid  Function Tests: No results for input(s): TSH, T4TOTAL, FREET4, T3FREE, THYROIDAB in the last 72 hours. Anemia Panel: No results for input(s): VITAMINB12, FOLATE, FERRITIN, TIBC, IRON, RETICCTPCT in the last 72 hours. Sepsis Labs: No results for input(s): PROCALCITON, LATICACIDVEN in the last 168 hours.  No results found for this or any previous visit (from the past 240 hours).       Radiology Studies: US  Abdomen Limited RUQ (LIVER/GB) Result Date: 02/10/2024 CLINICAL DATA:  Right upper quadrant pain EXAM: ULTRASOUND ABDOMEN LIMITED RIGHT UPPER QUADRANT COMPARISON:  CT abdomen and pelvis 02/10/2024 FINDINGS: Gallbladder: Multiple gallstones are present measuring up to 1.4 cm. There is gallbladder wall thickening measuring up to 4.5 mm. Gallbladder sludge is also present. There is no pericholecystic fluid. Sonographic Beverley sign is negative. Common bile duct: Diameter: 5.7 mm Liver: No focal lesion identified. Lobular contour. Mild increase in parenchymal echogenicity. Portal vein is patent on color Doppler imaging with normal direction of blood flow towards the liver. Other: None. IMPRESSION: 1. Cholelithiasis with gallbladder wall thickening. Sonographic Beverley sign is negative. Findings are equivocal for acute cholecystitis. 2. Lobular contour of the liver with mild increase in parenchymal echogenicity. Findings are nonspecific  but can be seen in the setting of cirrhosis. Electronically Signed   By: Greig Pique M.D.   On: 02/10/2024 22:52   CT ABDOMEN PELVIS WO CONTRAST Result Date: 02/10/2024 CLINICAL DATA:  Abdominal pain, acute, nonlocalized. EXAM: CT ABDOMEN AND PELVIS WITHOUT CONTRAST TECHNIQUE: Multidetector CT imaging of the abdomen and pelvis was performed following the standard protocol without IV contrast. RADIATION DOSE REDUCTION: This exam was performed according to the departmental dose-optimization program which includes automated exposure control, adjustment of the mA and/or kV according to patient size and/or use of iterative reconstruction technique. COMPARISON:  CT abdomen and pelvis without contrast 10/21/2021. FINDINGS: Lower chest: Lung bases show mild subpleural reticulation without active infiltrate. Cardiomegaly. There is no pericardial effusion. CABG changes with heavy calcification of the native coronary arteries. There is a small hiatal hernia. Hepatobiliary: Loss of fine detail due to a breathing motion. There is a cirrhotic liver configuration, with  capsular nodularity primarily in the left lobe. The portal vein is normal caliber. No liver masses seen through the breathing motion. There are multiple small layering gallstones again noted. There is no overt wall thickening but there may be pericholecystic edema, versus exaggerated pericholecystic interstitium due to respiratory motion. Ultrasound can be obtained if there is clinical uncertainty as to possible cholecystitis. There is no pericholecystic fluid or biliary dilatation. Pancreas: Partially atrophic. Otherwise unremarkable without contrast. Spleen: No abnormality is seen through the breathing motion. No splenomegaly. Adrenals/Urinary Tract: No right adrenal mass. Chronic calcifications in the lateral right adrenal limb. There is chronic nodular thickening and calcifications of the left adrenal gland, stable. Right kidney again noted partially  atrophic. There are Bosniak 1 cysts, in the lower lateral right kidney measuring 1.6 cm and 5.3 Hounsfield units, in the outer mid left kidney measuring 1.6 cm and -1.1 Hounsfield units. No follow-up imaging is recommended. This was seen previously. There is no other contour deforming abnormality of either unenhanced kidney. Similar perinephric fat stranding. There is no urinary stone or obstruction. There is a bladder base impression by the enlarged prostate. There is mild bladder thickening versus nondistention. Query bladder hypertrophy or cystitis. Favor chronic bladder outlet obstruction. Stomach/Bowel: No dilatation or wall thickening including the appendix. Scattered uncomplicated sigmoid diverticulosis. Vascular/Lymphatic: Moderate patchy aortoiliac and branch vessel atherosclerosis. No AAA. No adenopathy. Reproductive: Enlarged prostate is 5.7 cm transverse, previously 5.5 cm. Other: Umbilical and bilateral inguinal fat hernias, right inguinal fat hernia is larger. No entrapped bowel is seen. No free fluid, free hemorrhage, free air or localizing inflammatory process. Musculoskeletal: Osteopenia and degenerative change lumbar spine with slight dextroscoliosis. Mild hip DJD. Bilateral SI joint bridging osteophytes. No acute or significant osseous findings. IMPRESSION: 1. Cholelithiasis with possible pericholecystic edema versus exaggerated pericholecystic interstitium due to respiratory motion. Ultrasound can be obtained if there is clinical uncertainty as to possible cholecystitis. 2. Cirrhotic liver. No liver masses seen through the breathing motion. No splenomegaly or ascites. 3. Cardiomegaly with CABG changes. 4. Small hiatal hernia. 5. Aortic and coronary artery atherosclerosis. 6. Prostatomegaly with bladder base impression and mild bladder thickening versus nondistention. Query bladder hypertrophy or cystitis. Favor chronic bladder outlet obstruction. 7. Umbilical and bilateral inguinal fat hernias.  8. Diverticulosis without evidence of diverticulitis. 9. Osteopenia and degenerative change. Aortic Atherosclerosis (ICD10-I70.0). Electronically Signed   By: Francis Quam M.D.   On: 02/10/2024 21:27   DG Chest 2 View Result Date: 02/10/2024 CLINICAL DATA:  Chest pain EXAM: CHEST - 2 VIEW COMPARISON:  Chest x-ray 01/10/2021 FINDINGS: Heart is enlarged. The sternotomy wires are present. There is no focal lung infiltrate, pleural effusion or pneumothorax. No acute fractures are seen. IMPRESSION: Cardiomegaly. No acute cardiopulmonary process. Electronically Signed   By: Greig Pique M.D.   On: 02/10/2024 19:02        Scheduled Meds: Continuous Infusions:  cefTRIAXone  (ROCEPHIN )  IV       LOS: 0 days    Time spent: 45 minutes spent on chart review, discussion with nursing staff, consultants, updating family and interview/physical exam; more than 50% of that time was spent in counseling and/or coordination of care.    Harlene RAYMOND Bowl, DO [facility] Available via Epic secure chat 7am-7pm After these hours, please refer to coverage provider listed on [email or web address] 02/11/2024, 11:50 AM

## 2024-02-11 NOTE — Progress Notes (Addendum)
 Subjective: CC: Reports abdominal pain has resolved. No recent prn pain medication. NPO. No n/v.   Denies hx of similar symptoms prior to presentation. Reports he does eat high fat foods frequently.   Objective: Vital signs in last 24 hours: Temp:  [97.6 F (36.4 C)-98.2 F (36.8 C)] 98.1 F (36.7 C) (08/11 0900) Pulse Rate:  [50-93] 77 (08/11 0900) Resp:  [14-25] 24 (08/11 0900) BP: (112-159)/(52-75) 119/59 (08/11 0900) SpO2:  [93 %-100 %] 98 % (08/11 0900)    Intake/Output from previous day: No intake/output data recorded. Intake/Output this shift: No intake/output data recorded.  PE: Gen:  Alert, NAD, pleasant Abd: Soft, ND, NT  Lab Results:  Recent Labs    02/10/24 1838 02/11/24 0500  WBC 13.6* 15.8*  HGB 13.7 12.7*  HCT 43.8 39.9  PLT 192 159   BMET Recent Labs    02/10/24 1838 02/11/24 0500  NA 136 136  K 4.0 4.3  CL 102 105  CO2 21* 20*  GLUCOSE 166* 164*  BUN 24* 26*  CREATININE 2.11* 2.00*  CALCIUM  9.5 8.9   PT/INR Recent Labs    02/11/24 0500  LABPROT 21.8*  INR 1.8*   CMP     Component Value Date/Time   NA 136 02/11/2024 0500   NA 142 08/24/2021 1148   K 4.3 02/11/2024 0500   CL 105 02/11/2024 0500   CO2 20 (L) 02/11/2024 0500   GLUCOSE 164 (H) 02/11/2024 0500   BUN 26 (H) 02/11/2024 0500   BUN 31 (H) 08/24/2021 1148   CREATININE 2.00 (H) 02/11/2024 0500   CREATININE 0.90 12/08/2014 0803   CALCIUM  8.9 02/11/2024 0500   PROT 6.1 (L) 02/11/2024 0500   PROT 6.5 06/29/2021 1506   ALBUMIN 2.7 (L) 02/11/2024 0500   ALBUMIN 3.7 06/29/2021 1506   AST 29 02/11/2024 0500   ALT 18 02/11/2024 0500   ALKPHOS 77 02/11/2024 0500   BILITOT 1.2 02/11/2024 0500   BILITOT 0.7 06/29/2021 1506   GFRNONAA 32 (L) 02/11/2024 0500   GFRAA 73 06/24/2018 0922   Lipase     Component Value Date/Time   LIPASE 40 02/10/2024 1838    Studies/Results: US  Abdomen Limited RUQ (LIVER/GB) Result Date: 02/10/2024 CLINICAL DATA:  Right upper  quadrant pain EXAM: ULTRASOUND ABDOMEN LIMITED RIGHT UPPER QUADRANT COMPARISON:  CT abdomen and pelvis 02/10/2024 FINDINGS: Gallbladder: Multiple gallstones are present measuring up to 1.4 cm. There is gallbladder wall thickening measuring up to 4.5 mm. Gallbladder sludge is also present. There is no pericholecystic fluid. Sonographic Beverley sign is negative. Common bile duct: Diameter: 5.7 mm Liver: No focal lesion identified. Lobular contour. Mild increase in parenchymal echogenicity. Portal vein is patent on color Doppler imaging with normal direction of blood flow towards the liver. Other: None. IMPRESSION: 1. Cholelithiasis with gallbladder wall thickening. Sonographic Beverley sign is negative. Findings are equivocal for acute cholecystitis. 2. Lobular contour of the liver with mild increase in parenchymal echogenicity. Findings are nonspecific but can be seen in the setting of cirrhosis. Electronically Signed   By: Greig Pique M.D.   On: 02/10/2024 22:52   CT ABDOMEN PELVIS WO CONTRAST Result Date: 02/10/2024 CLINICAL DATA:  Abdominal pain, acute, nonlocalized. EXAM: CT ABDOMEN AND PELVIS WITHOUT CONTRAST TECHNIQUE: Multidetector CT imaging of the abdomen and pelvis was performed following the standard protocol without IV contrast. RADIATION DOSE REDUCTION: This exam was performed according to the departmental dose-optimization program which includes automated exposure control, adjustment of the mA  and/or kV according to patient size and/or use of iterative reconstruction technique. COMPARISON:  CT abdomen and pelvis without contrast 10/21/2021. FINDINGS: Lower chest: Lung bases show mild subpleural reticulation without active infiltrate. Cardiomegaly. There is no pericardial effusion. CABG changes with heavy calcification of the native coronary arteries. There is a small hiatal hernia. Hepatobiliary: Loss of fine detail due to a breathing motion. There is a cirrhotic liver configuration, with capsular  nodularity primarily in the left lobe. The portal vein is normal caliber. No liver masses seen through the breathing motion. There are multiple small layering gallstones again noted. There is no overt wall thickening but there may be pericholecystic edema, versus exaggerated pericholecystic interstitium due to respiratory motion. Ultrasound can be obtained if there is clinical uncertainty as to possible cholecystitis. There is no pericholecystic fluid or biliary dilatation. Pancreas: Partially atrophic. Otherwise unremarkable without contrast. Spleen: No abnormality is seen through the breathing motion. No splenomegaly. Adrenals/Urinary Tract: No right adrenal mass. Chronic calcifications in the lateral right adrenal limb. There is chronic nodular thickening and calcifications of the left adrenal gland, stable. Right kidney again noted partially atrophic. There are Bosniak 1 cysts, in the lower lateral right kidney measuring 1.6 cm and 5.3 Hounsfield units, in the outer mid left kidney measuring 1.6 cm and -1.1 Hounsfield units. No follow-up imaging is recommended. This was seen previously. There is no other contour deforming abnormality of either unenhanced kidney. Similar perinephric fat stranding. There is no urinary stone or obstruction. There is a bladder base impression by the enlarged prostate. There is mild bladder thickening versus nondistention. Query bladder hypertrophy or cystitis. Favor chronic bladder outlet obstruction. Stomach/Bowel: No dilatation or wall thickening including the appendix. Scattered uncomplicated sigmoid diverticulosis. Vascular/Lymphatic: Moderate patchy aortoiliac and branch vessel atherosclerosis. No AAA. No adenopathy. Reproductive: Enlarged prostate is 5.7 cm transverse, previously 5.5 cm. Other: Umbilical and bilateral inguinal fat hernias, right inguinal fat hernia is larger. No entrapped bowel is seen. No free fluid, free hemorrhage, free air or localizing inflammatory  process. Musculoskeletal: Osteopenia and degenerative change lumbar spine with slight dextroscoliosis. Mild hip DJD. Bilateral SI joint bridging osteophytes. No acute or significant osseous findings. IMPRESSION: 1. Cholelithiasis with possible pericholecystic edema versus exaggerated pericholecystic interstitium due to respiratory motion. Ultrasound can be obtained if there is clinical uncertainty as to possible cholecystitis. 2. Cirrhotic liver. No liver masses seen through the breathing motion. No splenomegaly or ascites. 3. Cardiomegaly with CABG changes. 4. Small hiatal hernia. 5. Aortic and coronary artery atherosclerosis. 6. Prostatomegaly with bladder base impression and mild bladder thickening versus nondistention. Query bladder hypertrophy or cystitis. Favor chronic bladder outlet obstruction. 7. Umbilical and bilateral inguinal fat hernias. 8. Diverticulosis without evidence of diverticulitis. 9. Osteopenia and degenerative change. Aortic Atherosclerosis (ICD10-I70.0). Electronically Signed   By: Francis Quam M.D.   On: 02/10/2024 21:27   DG Chest 2 View Result Date: 02/10/2024 CLINICAL DATA:  Chest pain EXAM: CHEST - 2 VIEW COMPARISON:  Chest x-ray 01/10/2021 FINDINGS: Heart is enlarged. The sternotomy wires are present. There is no focal lung infiltrate, pleural effusion or pneumothorax. No acute fractures are seen. IMPRESSION: Cardiomegaly. No acute cardiopulmonary process. Electronically Signed   By: Greig Pique M.D.   On: 02/10/2024 19:02    Anti-infectives: Anti-infectives (From admission, onward)    Start     Dose/Rate Route Frequency Ordered Stop   02/11/24 2200  cefTRIAXone  (ROCEPHIN ) 2 g in sodium chloride  0.9 % 100 mL IVPB  2 g 200 mL/hr over 30 Minutes Intravenous Every 24 hours 02/10/24 2348     02/11/24 1000  metroNIDAZOLE  (FLAGYL ) IVPB 500 mg        500 mg 100 mL/hr over 60 Minutes Intravenous Every 12 hours 02/10/24 2348     02/10/24 2330  cefTRIAXone  (ROCEPHIN ) 1  g in sodium chloride  0.9 % 100 mL IVPB  Status:  Discontinued        1 g 200 mL/hr over 30 Minutes Intravenous  Once 02/10/24 2316 02/10/24 2320   02/10/24 2330  metroNIDAZOLE  (FLAGYL ) IVPB 500 mg        500 mg 100 mL/hr over 60 Minutes Intravenous  Once 02/10/24 2316 02/11/24 0159   02/10/24 2330  cefTRIAXone  (ROCEPHIN ) 2 g in sodium chloride  0.9 % 100 mL IVPB        2 g 200 mL/hr over 30 Minutes Intravenous  Once 02/10/24 2320 02/11/24 0054        Assessment/Plan Possible Cholecystitis  - CT Cholelithiasis with possible pericholecystic edema. Also noted ot have cirrhotic appearing liver - RUQ US  w/ cholelithiasis up to 1.4cm with gallbladder wall thickening to 4.71mm, no pericholecystitic fluid. CBD 5.7. Also noted non-specific findings that can be present in setting of cirrhosis - He is on IV abx, okay to continue - Afebrile. No tachycardia or systolic hypotension - WBC 15.8. LFT's wnl. Lipase wnl on admission.  - On exam he is NT - Given resolution of abdominal pain and he is NT on exam I do not think he needs emergent surgery. I would recommend trailing cld and see how he does with this. Cont to trend daily labs. If he has any recurrence of pain with diet advancement, lab changes (rising leukocytosis or elevated LFT's) or hemodynamic changes we could consider HIDA to more definitively rule in vs rule out Cholecystitis (understanding with Cirrhosis, this may give false positive). If needs HIDA, would engage Cardiology to determine peri-operative risk stratification incase HIDA is positive and to help guide Lap Chole vs Perc Chole. If improves with conservative management, would recommend low fat diet at d/c and outpatient f/u.  - Recommend f/u with GI for w/u for Cirrhosis findings on imaging - LFT's wnl, INR 1.8  FEN - CLD. IVF per primary  VTE - SCDs, hold Xarelto  (LD 8/9 PM), okay for heparin gtt ID - Rocephin . Does not need Flagyl  from our standpoint.   CAD s/p CABG  (2012) DM HTN HLD CKD Hx CVA Afib on Xarelto    I reviewed nursing notes, hospitalist notes, last 24 h vitals and pain scores, last 48 h intake and output, last 24 h labs and trends, and last 24 h imaging results.   LOS: 0 days    Ozell CHRISTELLA Shaper, [facility] Surgery 02/11/2024, 10:36 AM Please see Amion for pager number during day hours 7:00am-4:30pm

## 2024-02-11 NOTE — Plan of Care (Signed)

## 2024-02-11 NOTE — Plan of Care (Signed)
  Problem: Education: Goal: Knowledge of General Education information will improve Description: Including pain rating scale, medication(s)/side effects and non-pharmacologic comfort measures 02/11/2024 1802 by Casimir Keturah LABOR, RN Outcome: Progressing 02/11/2024 1158 by Casimir Keturah LABOR, RN Outcome: Progressing   Problem: Health Behavior/Discharge Planning: Goal: Ability to manage health-related needs will improve 02/11/2024 1802 by Casimir Keturah LABOR, RN Outcome: Progressing 02/11/2024 1158 by Casimir Keturah LABOR, RN Outcome: Progressing   Problem: Clinical Measurements: Goal: Ability to maintain clinical measurements within normal limits will improve 02/11/2024 1802 by Casimir Keturah LABOR, RN Outcome: Progressing 02/11/2024 1158 by Casimir Keturah LABOR, RN Outcome: Progressing Goal: Will remain free from infection 02/11/2024 1802 by Casimir Keturah LABOR, RN Outcome: Progressing 02/11/2024 1158 by Casimir Keturah LABOR, RN Outcome: Progressing Goal: Diagnostic test results will improve 02/11/2024 1802 by Casimir Keturah LABOR, RN Outcome: Progressing 02/11/2024 1158 by Casimir Keturah LABOR, RN Outcome: Progressing Goal: Respiratory complications will improve 02/11/2024 1802 by Casimir Keturah LABOR, RN Outcome: Progressing 02/11/2024 1158 by Casimir Keturah LABOR, RN Outcome: Progressing Goal: Cardiovascular complication will be avoided 02/11/2024 1802 by Casimir Keturah LABOR, RN Outcome: Progressing 02/11/2024 1158 by Casimir Keturah LABOR, RN Outcome: Progressing   Problem: Activity: Goal: Risk for activity intolerance will decrease 02/11/2024 1802 by Casimir Keturah LABOR, RN Outcome: Progressing 02/11/2024 1158 by Casimir Keturah LABOR, RN Outcome: Progressing   Problem: Nutrition: Goal: Adequate nutrition will be maintained 02/11/2024 1802 by Casimir Keturah LABOR, RN Outcome: Progressing 02/11/2024 1158 by Casimir Keturah LABOR, RN Outcome: Progressing   Problem: Coping: Goal: Level of anxiety will  decrease 02/11/2024 1802 by Casimir Keturah LABOR, RN Outcome: Progressing 02/11/2024 1158 by Casimir Keturah LABOR, RN Outcome: Progressing   Problem: Elimination: Goal: Will not experience complications related to bowel motility 02/11/2024 1802 by Casimir Keturah LABOR, RN Outcome: Progressing 02/11/2024 1158 by Casimir Keturah LABOR, RN Outcome: Progressing Goal: Will not experience complications related to urinary retention 02/11/2024 1802 by Casimir Keturah LABOR, RN Outcome: Progressing 02/11/2024 1158 by Casimir Keturah LABOR, RN Outcome: Progressing   Problem: Pain Managment: Goal: General experience of comfort will improve and/or be controlled 02/11/2024 1802 by Casimir Keturah LABOR, RN Outcome: Progressing 02/11/2024 1158 by Casimir Keturah LABOR, RN Outcome: Progressing   Problem: Safety: Goal: Ability to remain free from injury will improve 02/11/2024 1802 by Casimir Keturah LABOR, RN Outcome: Progressing 02/11/2024 1158 by Casimir Keturah LABOR, RN Outcome: Progressing   Problem: Skin Integrity: Goal: Risk for impaired skin integrity will decrease 02/11/2024 1802 by Casimir Keturah LABOR, RN Outcome: Progressing 02/11/2024 1158 by Casimir Keturah LABOR, RN Outcome: Progressing

## 2024-02-11 NOTE — Consult Note (Signed)
 Shane Espinoza [facility] 06-Mar-1941  [id number].    Requesting MD: Howerter Chief Complaint/Reason for Consult: ?Cholecystitis  HPI:  83 y/o M w/ a hx of CAD s/p CABG (2012), DM, HTN, HLD, CKD, CVA, and Afib on Xarelto  who presented to the ED with sudden onset epigastric pain with radiation to the back. He ate BBQ and ice cream for dinner and then awoke the next morning with significant pain that failed to improve throughout the day. CT w/ performed and showed evidence of cirrhosis w/ cholelithiasis and ?pericholecystic edema. Follow up US  showed GB wall thickening with equivocal findings of cholecystitis.  Labs notable for WBC 14, Cr 2.1, Tbili 1.9  On exam, patient is resting in bed. He reports that his pain has nearly resolved. He last took Xarelto  8/9 AM.   ROS: Review of Systems  Constitutional: Negative.   HENT: Negative.    Eyes: Negative.   Respiratory: Negative.    Cardiovascular: Negative.   Gastrointestinal:  Positive for abdominal pain.  Genitourinary: Negative.   Musculoskeletal: Negative.   Skin: Negative.   Neurological: Negative.   Endo/Heme/Allergies: Negative.   Psychiatric/Behavioral: Negative.      Family History  Problem Relation Age of Onset   CVA Father    Heart attack Maternal Grandmother    Heart attack Maternal Grandfather    Heart murmur Sister    Hypertension Son     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   [name]    Past Surgical History:  Procedure Laterality Date   CARDIAC CATHETERIZATION  03/02/2011   Recommended CABG   COLON SURGERY     CORONARY ARTERY BYPASS GRAFT  03/07/2011   DR.VAN  TRIGHT   EYE SURGERY     LEXISCAN  MYOVIEW   02/20/2012   EKG negative for ischemia, noraml study   TRANSTHORACIC ECHOCARDIOGRAM  02/20/2012   EF 50-55%, mild-moderate  concentric LVH, LA moderately dilated, moderate mitral regurg   VASCULAR DOPPLER  03/03/2011   Nosignificant extracranial carotid artery stenosis    Social History:  reports that he has never smoked. He has never used smokeless tobacco. He reports that he does not drink alcohol and does not use drugs.  Allergies:  Allergies  Allergen Reactions   Lisinopril     Other reaction(s): cough    (Not in a hospital admission)   Physical Exam: Blood pressure (!) 143/63, pulse (!) 50, temperature 97.6 F (36.4 C), resp. rate (!) 23, SpO2 100%. Gen: male, NAD Abd: soft, non-distended, non-tender to palpation  Results for orders placed or performed during the hospital encounter of 02/10/24 (from the past 48 hours)  Lipase, blood     Status: None   Collection Time: 02/10/24  6:38 PM  Result Value Ref Range   Lipase 40 11 - 51 U/L    Comment: Performed at [facility], [address] [place], [place] [address]  Comprehensive metabolic panel     Status: Abnormal   Collection Time: 02/10/24  6:38 PM  Result Value Ref Range   Sodium 136 135 - 145 mmol/L   Potassium 4.0 3.5 - 5.1 mmol/L   Chloride 102 98 - 111 mmol/L   CO2 21 (L) 22 - 32 mmol/L  Glucose, Bld 166 (H) 70 - 99 mg/dL    Comment: Glucose reference range applies only to samples taken after fasting for at least 8 hours.   BUN 24 (H) 8 - 23 mg/dL   Creatinine, Ser 7.88 (H) 0.61 - 1.24 mg/dL   Calcium  9.5 8.9 - 10.3 mg/dL   Total Protein 7.3 6.5 - 8.1 g/dL   Albumin 3.3 (L) 3.5 - 5.0 g/dL   AST 34 15 - 41 U/L   ALT 22 0 - 44 U/L   Alkaline Phosphatase 97 38 - 126 U/L   Total Bilirubin 1.9 (H) 0.0 - 1.2 mg/dL   GFR, Estimated 30 (L) >60 mL/min    Comment: (NOTE) Calculated using the CKD-EPI Creatinine Equation (2021)    Anion gap 13 5 - 15    Comment: Performed at [facility], [address], [place], [place] [address]  CBC     Status: Abnormal   Collection Time: 02/10/24  6:38 PM  Result Value Ref Range    WBC 13.6 (H) 4.0 - 10.5 K/uL   RBC 4.78 4.22 - 5.81 MIL/uL   Hemoglobin 13.7 13.0 - 17.0 g/dL   HCT 56.1 60.9 - 47.9 %   MCV 91.6 80.0 - 100.0 fL   MCH 28.7 26.0 - 34.0 pg   MCHC 31.3 30.0 - 36.0 g/dL   RDW 85.0 88.4 - 84.4 %   Platelets 192 150 - 400 K/uL   nRBC 0.0 0.0 - 0.2 %    Comment: Performed at [facility], [address], [place], [place] [address]  Troponin I (High Sensitivity)     Status: Abnormal   Collection Time: 02/10/24  6:38 PM  Result Value Ref Range   Troponin I (High Sensitivity) 25 (H) <18 ng/L    Comment: (NOTE) Elevated high sensitivity troponin I (hsTnI) values and significant  changes across serial measurements may suggest ACS but many other  chronic and acute conditions are known to elevate hsTnI results.  Refer to the Links section for chest pain algorithms and additional  guidance. Performed at [facility], [address] [place], [place] [address]   Urinalysis, Routine w reflex microscopic -Urine, Clean Catch     Status: None   Collection Time: 02/10/24  8:36 PM  Result Value Ref Range   Color, Urine YELLOW YELLOW   APPearance CLEAR CLEAR   Specific Gravity, Urine 1.015 1.005 - 1.030   pH 5.0 5.0 - 8.0   Glucose, UA NEGATIVE NEGATIVE mg/dL   Hgb urine dipstick NEGATIVE NEGATIVE   Bilirubin Urine NEGATIVE NEGATIVE   Ketones, ur NEGATIVE NEGATIVE mg/dL   Protein, ur NEGATIVE NEGATIVE mg/dL   Nitrite NEGATIVE NEGATIVE   Leukocytes,Ua NEGATIVE NEGATIVE    Comment: Performed at [facility], [address], [place], [place] [address]  Troponin I (High Sensitivity)     Status: Abnormal   Collection Time: 02/10/24  9:06 PM  Result Value Ref Range   Troponin I (High Sensitivity) 24 (H) <18 ng/L    Comment: (NOTE) Elevated high sensitivity troponin I (hsTnI) values and significant  changes across serial measurements may suggest ACS but many other  chronic and acute conditions are known to elevate hsTnI results.  Refer to the  Links section for chest pain algorithms and additional  guidance. Performed at [facility], [address] [place], [place] [address]    US  Abdomen Limited RUQ (LIVER/GB) Result Date: 02/10/2024 CLINICAL DATA:  Right  upper quadrant pain EXAM: ULTRASOUND ABDOMEN LIMITED RIGHT UPPER QUADRANT COMPARISON:  CT abdomen and pelvis 02/10/2024 FINDINGS: Gallbladder: Multiple gallstones are present measuring up to 1.4 cm. There is gallbladder wall thickening measuring up to 4.5 mm. Gallbladder sludge is also present. There is no pericholecystic fluid. Sonographic Beverley sign is negative. Common bile duct: Diameter: 5.7 mm Liver: No focal lesion identified. Lobular contour. Mild increase in parenchymal echogenicity. Portal vein is patent on color Doppler imaging with normal direction of blood flow towards the liver. Other: None. IMPRESSION: 1. Cholelithiasis with gallbladder wall thickening. Sonographic Beverley sign is negative. Findings are equivocal for acute cholecystitis. 2. Lobular contour of the liver with mild increase in parenchymal echogenicity. Findings are nonspecific but can be seen in the setting of cirrhosis. Electronically Signed   By: Greig Pique M.D.   On: 02/10/2024 22:52   CT ABDOMEN PELVIS WO CONTRAST Result Date: 02/10/2024 CLINICAL DATA:  Abdominal pain, acute, nonlocalized. EXAM: CT ABDOMEN AND PELVIS WITHOUT CONTRAST TECHNIQUE: Multidetector CT imaging of the abdomen and pelvis was performed following the standard protocol without IV contrast. RADIATION DOSE REDUCTION: This exam was performed according to the departmental dose-optimization program which includes automated exposure control, adjustment of the mA and/or kV according to patient size and/or use of iterative reconstruction technique. COMPARISON:  CT abdomen and pelvis without contrast 10/21/2021. FINDINGS: Lower chest: Lung bases show mild subpleural reticulation without active infiltrate. Cardiomegaly. There is no  pericardial effusion. CABG changes with heavy calcification of the native coronary arteries. There is a small hiatal hernia. Hepatobiliary: Loss of fine detail due to a breathing motion. There is a cirrhotic liver configuration, with capsular nodularity primarily in the left lobe. The portal vein is normal caliber. No liver masses seen through the breathing motion. There are multiple small layering gallstones again noted. There is no overt wall thickening but there may be pericholecystic edema, versus exaggerated pericholecystic interstitium due to respiratory motion. Ultrasound can be obtained if there is clinical uncertainty as to possible cholecystitis. There is no pericholecystic fluid or biliary dilatation. Pancreas: Partially atrophic. Otherwise unremarkable without contrast. Spleen: No abnormality is seen through the breathing motion. No splenomegaly. Adrenals/Urinary Tract: No right adrenal mass. Chronic calcifications in the lateral right adrenal limb. There is chronic nodular thickening and calcifications of the left adrenal gland, stable. Right kidney again noted partially atrophic. There are Bosniak 1 cysts, in the lower lateral right kidney measuring 1.6 cm and 5.3 Hounsfield units, in the outer mid left kidney measuring 1.6 cm and -1.1 Hounsfield units. No follow-up imaging is recommended. This was seen previously. There is no other contour deforming abnormality of either unenhanced kidney. Similar perinephric fat stranding. There is no urinary stone or obstruction. There is a bladder base impression by the enlarged prostate. There is mild bladder thickening versus nondistention. Query bladder hypertrophy or cystitis. Favor chronic bladder outlet obstruction. Stomach/Bowel: No dilatation or wall thickening including the appendix. Scattered uncomplicated sigmoid diverticulosis. Vascular/Lymphatic: Moderate patchy aortoiliac and branch vessel atherosclerosis. No AAA. No adenopathy. Reproductive: Enlarged  prostate is 5.7 cm transverse, previously 5.5 cm. Other: Umbilical and bilateral inguinal fat hernias, right inguinal fat hernia is larger. No entrapped bowel is seen. No free fluid, free hemorrhage, free air or localizing inflammatory process. Musculoskeletal: Osteopenia and degenerative change lumbar spine with slight dextroscoliosis. Mild hip DJD. Bilateral SI joint bridging osteophytes. No acute or significant osseous findings. IMPRESSION: 1. Cholelithiasis with possible pericholecystic edema versus exaggerated pericholecystic interstitium due to respiratory motion. Ultrasound can be  obtained if there is clinical uncertainty as to possible cholecystitis. 2. Cirrhotic liver. No liver masses seen through the breathing motion. No splenomegaly or ascites. 3. Cardiomegaly with CABG changes. 4. Small hiatal hernia. 5. Aortic and coronary artery atherosclerosis. 6. Prostatomegaly with bladder base impression and mild bladder thickening versus nondistention. Query bladder hypertrophy or cystitis. Favor chronic bladder outlet obstruction. 7. Umbilical and bilateral inguinal fat hernias. 8. Diverticulosis without evidence of diverticulitis. 9. Osteopenia and degenerative change. Aortic Atherosclerosis (ICD10-I70.0). Electronically Signed   By: Francis Quam M.D.   On: 02/10/2024 21:27   DG Chest 2 View Result Date: 02/10/2024 CLINICAL DATA:  Chest pain EXAM: CHEST - 2 VIEW COMPARISON:  Chest x-ray 01/10/2021 FINDINGS: Heart is enlarged. The sternotomy wires are present. There is no focal lung infiltrate, pleural effusion or pneumothorax. No acute fractures are seen. IMPRESSION: Cardiomegaly. No acute cardiopulmonary process. Electronically Signed   By: Greig Pique M.D.   On: 02/10/2024 19:02    Assessment/Plan 83 y/o M w/ multiple co-morbidities on Xarelto  who presents with abdominal pain and has imaging showing cirrhosis w/ cholelithiasis and ?cholecystitis  - No indication for urgent surgical intervention.  His imaging is equivocal for cholecystitis and he is currently reporting that his pain has resolved. - Admit to medicine - Rocephin  - Trend Tbili - Coags added to morning labs - Continue holding Xarelto  - Rounding team will evaluate later this morning for additional workup versus intervention   Cordella DELENA Polly Marlis [facility] Surgery 02/11/2024, 12:26 AM Please see Amion for pager number during day hours 7:00am-4:30pm or 7:00am -11:30am on weekends

## 2024-02-11 NOTE — Plan of Care (Signed)
   Problem: Education: Goal: Knowledge of General Education information will improve Description: Including pain rating scale, medication(s)/side effects and non-pharmacologic comfort measures Outcome: Completed/Met

## 2024-02-12 DIAGNOSIS — R933 Abnormal findings on diagnostic imaging of other parts of digestive tract: Secondary | ICD-10-CM | POA: Diagnosis not present

## 2024-02-12 DIAGNOSIS — K819 Cholecystitis, unspecified: Secondary | ICD-10-CM

## 2024-02-12 DIAGNOSIS — E119 Type 2 diabetes mellitus without complications: Secondary | ICD-10-CM | POA: Diagnosis not present

## 2024-02-12 DIAGNOSIS — R1011 Right upper quadrant pain: Secondary | ICD-10-CM | POA: Diagnosis not present

## 2024-02-12 LAB — COMPREHENSIVE METABOLIC PANEL WITH GFR
ALT: 16 U/L (ref 0–44)
AST: 27 U/L (ref 15–41)
Albumin: 2.4 g/dL — ABNORMAL LOW (ref 3.5–5.0)
Alkaline Phosphatase: 66 U/L (ref 38–126)
Anion gap: 11 (ref 5–15)
BUN: 28 mg/dL — ABNORMAL HIGH (ref 8–23)
CO2: 20 mmol/L — ABNORMAL LOW (ref 22–32)
Calcium: 8.4 mg/dL — ABNORMAL LOW (ref 8.9–10.3)
Chloride: 105 mmol/L (ref 98–111)
Creatinine, Ser: 1.97 mg/dL — ABNORMAL HIGH (ref 0.61–1.24)
GFR, Estimated: 33 mL/min — ABNORMAL LOW (ref >60)
Glucose, Bld: 131 mg/dL — ABNORMAL HIGH (ref 70–99)
Potassium: 3.9 mmol/L (ref 3.5–5.1)
Sodium: 136 mmol/L (ref 135–145)
Total Bilirubin: 1.4 mg/dL — ABNORMAL HIGH (ref 0.0–1.2)
Total Protein: 5.5 g/dL — ABNORMAL LOW (ref 6.5–8.1)

## 2024-02-12 LAB — CBC
HCT: 37.3 % — ABNORMAL LOW (ref 39.0–52.0)
Hemoglobin: 12 g/dL — ABNORMAL LOW (ref 13.0–17.0)
MCH: 29 pg (ref 26.0–34.0)
MCHC: 32.2 g/dL (ref 30.0–36.0)
MCV: 90.1 fL (ref 80.0–100.0)
Platelets: 149 K/uL — ABNORMAL LOW (ref 150–400)
RBC: 4.14 MIL/uL — ABNORMAL LOW (ref 4.22–5.81)
RDW: 15.1 % (ref 11.5–15.5)
WBC: 13.1 K/uL — ABNORMAL HIGH (ref 4.0–10.5)
nRBC: 0 % (ref 0.0–0.2)

## 2024-02-12 MED ORDER — RIVAROXABAN 15 MG PO TABS
15.0000 mg | ORAL_TABLET | Freq: Every day | ORAL | Status: DC
  Administered 2024-02-12 (×2): 15 mg via ORAL
  Filled 2024-02-12: qty 1

## 2024-02-12 NOTE — Plan of Care (Signed)
   Problem: Clinical Measurements: Goal: Diagnostic test results will improve Outcome: Completed/Met Goal: Respiratory complications will improve Outcome: Completed/Met Goal: Cardiovascular complication will be avoided Outcome: Completed/Met

## 2024-02-12 NOTE — Progress Notes (Signed)
 Subjective: Pain continues to be improved.  Objective: Vital signs in last 24 hours: Temp:  [98 F (36.7 C)-98.7 F (37.1 C)] 98.3 F (36.8 C) (08/12 0850) Pulse Rate:  [62-77] 73 (08/12 0850) Resp:  [18-26] 18 (08/12 0850) BP: (111-131)/(56-75) 111/56 (08/12 0850) SpO2:  [95 %-100 %] 99 % (08/12 0850) FiO2 (%):  [21 %] 21 % (08/11 2318) Weight:  [95.2 kg] 95.2 kg (08/11 1211) Last BM Date : 02/10/24  Intake/Output from previous day: 08/11 0701 - 08/12 0700 In: 1018.7 [P.O.:820; IV Piggyback:198.7] Out: 150 [Urine:150] Intake/Output this shift: Total I/O In: 400 [P.O.:400] Out: 400 [Urine:400]  PE: Gen:  Alert, NAD, pleasant Abd: Soft, ND, NT  Lab Results:  Recent Labs    02/11/24 0500 02/12/24 0458  WBC 15.8* 13.1*  HGB 12.7* 12.0*  HCT 39.9 37.3*  PLT 159 149*   BMET Recent Labs    02/11/24 0500 02/12/24 0458  NA 136 136  K 4.3 3.9  CL 105 105  CO2 20* 20*  GLUCOSE 164* 131*  BUN 26* 28*  CREATININE 2.00* 1.97*  CALCIUM  8.9 8.4*   PT/INR Recent Labs    02/11/24 0500  LABPROT 21.8*  INR 1.8*   CMP     Component Value Date/Time   NA 136 02/12/2024 0458   NA 142 08/24/2021 1148   K 3.9 02/12/2024 0458   CL 105 02/12/2024 0458   CO2 20 (L) 02/12/2024 0458   GLUCOSE 131 (H) 02/12/2024 0458   BUN 28 (H) 02/12/2024 0458   BUN 31 (H) 08/24/2021 1148   CREATININE 1.97 (H) 02/12/2024 0458   CREATININE 0.90 12/08/2014 0803   CALCIUM  8.4 (L) 02/12/2024 0458   PROT 5.5 (L) 02/12/2024 0458   PROT 6.5 06/29/2021 1506   ALBUMIN 2.4 (L) 02/12/2024 0458   ALBUMIN 3.7 06/29/2021 1506   AST 27 02/12/2024 0458   ALT 16 02/12/2024 0458   ALKPHOS 66 02/12/2024 0458   BILITOT 1.4 (H) 02/12/2024 0458   BILITOT 0.7 06/29/2021 1506   GFRNONAA 33 (L) 02/12/2024 0458   GFRAA 73 06/24/2018 0922   Lipase     Component Value Date/Time   LIPASE 40 02/10/2024 1838    Studies/Results: US  Abdomen Limited RUQ (LIVER/GB) Result Date:  02/10/2024 CLINICAL DATA:  Right upper quadrant pain EXAM: ULTRASOUND ABDOMEN LIMITED RIGHT UPPER QUADRANT COMPARISON:  CT abdomen and pelvis 02/10/2024 FINDINGS: Gallbladder: Multiple gallstones are present measuring up to 1.4 cm. There is gallbladder wall thickening measuring up to 4.5 mm. Gallbladder sludge is also present. There is no pericholecystic fluid. Sonographic Beverley sign is negative. Common bile duct: Diameter: 5.7 mm Liver: No focal lesion identified. Lobular contour. Mild increase in parenchymal echogenicity. Portal vein is patent on color Doppler imaging with normal direction of blood flow towards the liver. Other: None. IMPRESSION: 1. Cholelithiasis with gallbladder wall thickening. Sonographic Beverley sign is negative. Findings are equivocal for acute cholecystitis. 2. Lobular contour of the liver with mild increase in parenchymal echogenicity. Findings are nonspecific but can be seen in the setting of cirrhosis. Electronically Signed   By: Greig Pique M.D.   On: 02/10/2024 22:52   CT ABDOMEN PELVIS WO CONTRAST Result Date: 02/10/2024 CLINICAL DATA:  Abdominal pain, acute, nonlocalized. EXAM: CT ABDOMEN AND PELVIS WITHOUT CONTRAST TECHNIQUE: Multidetector CT imaging of the abdomen and pelvis was performed following the standard protocol without IV contrast. RADIATION DOSE REDUCTION: This exam was performed according to the departmental dose-optimization program which includes  automated exposure control, adjustment of the mA and/or kV according to patient size and/or use of iterative reconstruction technique. COMPARISON:  CT abdomen and pelvis without contrast 10/21/2021. FINDINGS: Lower chest: Lung bases show mild subpleural reticulation without active infiltrate. Cardiomegaly. There is no pericardial effusion. CABG changes with heavy calcification of the native coronary arteries. There is a small hiatal hernia. Hepatobiliary: Loss of fine detail due to a breathing motion. There is a cirrhotic  liver configuration, with capsular nodularity primarily in the left lobe. The portal vein is normal caliber. No liver masses seen through the breathing motion. There are multiple small layering gallstones again noted. There is no overt wall thickening but there may be pericholecystic edema, versus exaggerated pericholecystic interstitium due to respiratory motion. Ultrasound can be obtained if there is clinical uncertainty as to possible cholecystitis. There is no pericholecystic fluid or biliary dilatation. Pancreas: Partially atrophic. Otherwise unremarkable without contrast. Spleen: No abnormality is seen through the breathing motion. No splenomegaly. Adrenals/Urinary Tract: No right adrenal mass. Chronic calcifications in the lateral right adrenal limb. There is chronic nodular thickening and calcifications of the left adrenal gland, stable. Right kidney again noted partially atrophic. There are Bosniak 1 cysts, in the lower lateral right kidney measuring 1.6 cm and 5.3 Hounsfield units, in the outer mid left kidney measuring 1.6 cm and -1.1 Hounsfield units. No follow-up imaging is recommended. This was seen previously. There is no other contour deforming abnormality of either unenhanced kidney. Similar perinephric fat stranding. There is no urinary stone or obstruction. There is a bladder base impression by the enlarged prostate. There is mild bladder thickening versus nondistention. Query bladder hypertrophy or cystitis. Favor chronic bladder outlet obstruction. Stomach/Bowel: No dilatation or wall thickening including the appendix. Scattered uncomplicated sigmoid diverticulosis. Vascular/Lymphatic: Moderate patchy aortoiliac and branch vessel atherosclerosis. No AAA. No adenopathy. Reproductive: Enlarged prostate is 5.7 cm transverse, previously 5.5 cm. Other: Umbilical and bilateral inguinal fat hernias, right inguinal fat hernia is larger. No entrapped bowel is seen. No free fluid, free hemorrhage, free  air or localizing inflammatory process. Musculoskeletal: Osteopenia and degenerative change lumbar spine with slight dextroscoliosis. Mild hip DJD. Bilateral SI joint bridging osteophytes. No acute or significant osseous findings. IMPRESSION: 1. Cholelithiasis with possible pericholecystic edema versus exaggerated pericholecystic interstitium due to respiratory motion. Ultrasound can be obtained if there is clinical uncertainty as to possible cholecystitis. 2. Cirrhotic liver. No liver masses seen through the breathing motion. No splenomegaly or ascites. 3. Cardiomegaly with CABG changes. 4. Small hiatal hernia. 5. Aortic and coronary artery atherosclerosis. 6. Prostatomegaly with bladder base impression and mild bladder thickening versus nondistention. Query bladder hypertrophy or cystitis. Favor chronic bladder outlet obstruction. 7. Umbilical and bilateral inguinal fat hernias. 8. Diverticulosis without evidence of diverticulitis. 9. Osteopenia and degenerative change. Aortic Atherosclerosis (ICD10-I70.0). Electronically Signed   By: Francis Quam M.D.   On: 02/10/2024 21:27   DG Chest 2 View Result Date: 02/10/2024 CLINICAL DATA:  Chest pain EXAM: CHEST - 2 VIEW COMPARISON:  Chest x-ray 01/10/2021 FINDINGS: Heart is enlarged. The sternotomy wires are present. There is no focal lung infiltrate, pleural effusion or pneumothorax. No acute fractures are seen. IMPRESSION: Cardiomegaly. No acute cardiopulmonary process. Electronically Signed   By: Greig Pique M.D.   On: 02/10/2024 19:02    Anti-infectives: Anti-infectives (From admission, onward)    Start     Dose/Rate Route Frequency Ordered Stop   02/11/24 2200  cefTRIAXone  (ROCEPHIN ) 2 g in sodium chloride  0.9 % 100 mL IVPB  2 g 200 mL/hr over 30 Minutes Intravenous Every 24 hours 02/10/24 2348     02/11/24 1000  metroNIDAZOLE  (FLAGYL ) IVPB 500 mg  Status:  Discontinued        500 mg 100 mL/hr over 60 Minutes Intravenous Every 12 hours  02/10/24 2348 02/11/24 1133   02/10/24 2330  cefTRIAXone  (ROCEPHIN ) 1 g in sodium chloride  0.9 % 100 mL IVPB  Status:  Discontinued        1 g 200 mL/hr over 30 Minutes Intravenous  Once 02/10/24 2316 02/10/24 2320   02/10/24 2330  metroNIDAZOLE  (FLAGYL ) IVPB 500 mg        500 mg 100 mL/hr over 60 Minutes Intravenous  Once 02/10/24 2316 02/11/24 0159   02/10/24 2330  cefTRIAXone  (ROCEPHIN ) 2 g in sodium chloride  0.9 % 100 mL IVPB        2 g 200 mL/hr over 30 Minutes Intravenous  Once 02/10/24 2320 02/11/24 0054        Assessment/Plan Possible Cholecystitis  - CT Cholelithiasis with possible pericholecystic edema. Also noted ot have cirrhotic appearing liver - RUQ US  w/ cholelithiasis up to 1.4cm with gallbladder wall thickening to 4.40mm, no pericholecystitic fluid. CBD 5.7. Also noted non-specific findings that can be present in setting of cirrhosis -  pain resolved - Advance diet - Avoid fatty foods - Restart home meds including blood thinner - Okay for discharge  - Follow up with me in office   FEN - Low fat diet  VTE - SCDs, Okay for Xarelto  ID - Rocephin . Does not need Flagyl  from our standpoint.   CAD s/p CABG (2012) DM HTN HLD CKD Hx CVA Afib on Xarelto    I reviewed nursing notes, hospitalist notes, last 24 h vitals and pain scores, last 48 h intake and output, last 24 h labs and trends, and last 24 h imaging results.   LOS: 0 days    Deward JINNY Foy, MD [facility] Surgery 02/12/2024, 9:41 AM Please see Amion for pager number during day hours 7:00am-4:30pm

## 2024-02-12 NOTE — Care Management Obs Status (Signed)
 MEDICARE OBSERVATION STATUS NOTIFICATION   Patient Details  Name: Shane Espinoza MRN: [id number] Date of Birth: 13-Oct-1940   Medicare Observation Status Notification Given:  Yes  Verbally reviewed observation notice with Arletta Bakes telephonically at [phone].     Kingson Lohmeyer 02/12/2024, 4:11 PM

## 2024-02-12 NOTE — Plan of Care (Signed)

## 2024-02-12 NOTE — Progress Notes (Signed)
 PROGRESS NOTE    Shane Espinoza  FMW:[id number] DOB: May 19, 1941 DOA: 02/10/2024 PCP: Charlott Dorn LABOR, MD    Brief Narrative:    IKAIKA Espinoza is a 83 y.o. 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   Assessment and Plan:  RUQ abdominal discomfort: Single episode of crampy elevated quadrant abdominal discomfort earlier this evening.  Ensuing CT abdomen/pelvis as well as right quadrant ultrasound is equivocal for acute cholecystitis.   -general surgery consult:   ok to d/c, low fat diet, outpatient follow up -patient was on liquid diet and w/o BM so was hesitant to go home today-- will advance diet and give bowel regimen with anticipation of d/c in the AM   Elevated troponin: Minimal elevation in high-sensitivity troponin I, with initial value 25, with repeat troponin trending down to 24.   -not impressive in light of CKD and lack of chest pain     Paroxysmal atrial fibrillation: Documented history of such. In setting of CHA2DS2-VASc score of  4 -resume xarelto      ? Cirrhosis -outpatient referral to GI for monitoring        Essential Hypertension: -resume meds as needed         CKD Stage 3B: Documented history of such, with baseline creatinine 1.7-2.1 -follows with Dr. Dolan         DVT prophylaxis: SCDs Start: 02/10/24 2346 Rivaroxaban  (XARELTO ) tablet 15 mg    Code Status: Full Code Family Communication: wife at bedside  Disposition Plan:  Level of care: Telemetry Medical Status is: Observation     Consultants:  GS   Subjective: Had some RUQ discomfort after a liquid breakfast-- wants to advance diet here before  d/c  Objective: Vitals:   02/11/24 2007 02/11/24 2318 02/12/24 0502 02/12/24 0850  BP: 124/60  121/65 (!) 111/56  Pulse: 77  69 73  Resp: 18  18 18   Temp: 98.7 F (37.1 C)  98 F (36.7 C) 98.3 F (36.8 C)  TempSrc:      SpO2: 97% 96% 95% 99%  Weight:      Height:        Intake/Output Summary (Last 24 hours) at 02/12/2024 1329 Last data filed at 02/12/2024 0900 Gross per 24 hour  Intake 1020 ml  Output 550 ml  Net 470 ml   Filed Weights   02/11/24 1211  Weight: 95.2 kg    Examination:   General: Appearance:    Obese male in no acute distress     Lungs:     respirations unlabored  Heart:    Normal heart rate.     MS:   All extremities are intact.    Neurologic:   Awake, alert       Data Reviewed: I have personally reviewed following labs and imaging studies  CBC: Recent Labs  Lab 02/10/24 1838 02/11/24 0500 02/12/24 0458  WBC 13.6* 15.8* 13.1*  NEUTROABS  --  11.5*  --   HGB 13.7 12.7* 12.0*  HCT 43.8 39.9 37.3*  MCV 91.6 90.1 90.1  PLT 192 159 149*   Basic Metabolic Panel: Recent Labs  Lab 02/10/24 1838 02/10/24 2106 02/11/24 0500 02/12/24 0458  NA 136  --  136 136  K 4.0  --  4.3 3.9  CL 102  --  105 105  CO2 21*  --  20* 20*  GLUCOSE 166*  --  164* 131*  BUN 24*  --  26* 28*  CREATININE 2.11*  --  2.00* 1.97*  CALCIUM  9.5  --  8.9 8.4*  MG  --  1.9 1.7  --    GFR: Estimated Creatinine Clearance: 31.2 mL/min (A) (by C-G formula based on SCr of 1.97 mg/dL (H)). Liver Function Tests: Recent Labs  Lab 02/10/24 1838 02/11/24 0500 02/12/24 0458  AST 34 29 27  ALT 22 18 16   ALKPHOS 97 77 66  BILITOT 1.9* 1.2 1.4*  PROT 7.3 6.1* 5.5*  ALBUMIN 3.3* 2.7* 2.4*   Recent Labs  Lab 02/10/24 1838  LIPASE 40   No results for input(s): AMMONIA in the last 168 hours. Coagulation Profile: Recent Labs  Lab 02/11/24 0500  INR 1.8*   Cardiac Enzymes: No results for input(s): CKTOTAL, CKMB, CKMBINDEX, TROPONINI in the last 168  hours. BNP (last 3 results) No results for input(s): PROBNP in the last 8760 hours. HbA1C: No results for input(s): HGBA1C in the last 72 hours. CBG: No results for input(s): GLUCAP in the last 168 hours. Lipid Profile: No results for input(s): CHOL, HDL, LDLCALC, TRIG, CHOLHDL, LDLDIRECT in the last 72 hours. Thyroid  Function Tests: No results for input(s): TSH, T4TOTAL, FREET4, T3FREE, THYROIDAB in the last 72 hours. Anemia Panel: No results for input(s): VITAMINB12, FOLATE, FERRITIN, TIBC, IRON, RETICCTPCT in the last 72 hours. Sepsis Labs: No results for input(s): PROCALCITON, LATICACIDVEN in the last 168 hours.  No results found for this or any previous visit (from the past 240 hours).       Radiology Studies: US  Abdomen Limited RUQ (LIVER/GB) Result Date: 02/10/2024 CLINICAL DATA:  Right upper quadrant pain EXAM: ULTRASOUND ABDOMEN LIMITED RIGHT UPPER QUADRANT COMPARISON:  CT abdomen and pelvis 02/10/2024 FINDINGS: Gallbladder: Multiple gallstones are present measuring up to 1.4 cm. There is gallbladder wall thickening measuring up to 4.5 mm. Gallbladder sludge is also present. There is no pericholecystic fluid. Sonographic Beverley sign is negative. Common bile duct: Diameter: 5.7 mm Liver: No focal lesion identified. Lobular contour. Mild increase in parenchymal echogenicity. Portal vein is patent on color Doppler imaging with normal direction of blood flow towards the liver. Other: None. IMPRESSION: 1. Cholelithiasis with gallbladder wall thickening. Sonographic Beverley sign is negative. Findings are equivocal for acute cholecystitis. 2. Lobular contour of the liver with mild increase in parenchymal echogenicity. Findings are nonspecific but can be seen in the setting of cirrhosis. Electronically Signed   By: Greig Pique M.D.   On: 02/10/2024 22:52   CT ABDOMEN PELVIS WO CONTRAST Result Date: 02/10/2024 CLINICAL DATA:  Abdominal pain,  acute, nonlocalized. EXAM: CT ABDOMEN AND PELVIS WITHOUT CONTRAST TECHNIQUE: Multidetector CT imaging of the abdomen and pelvis was performed following the standard protocol without IV contrast. RADIATION DOSE REDUCTION: This exam was performed according to the departmental dose-optimization program which includes automated exposure control, adjustment of the mA and/or kV according to patient size and/or use of iterative reconstruction technique. COMPARISON:  CT abdomen and pelvis without contrast 10/21/2021. FINDINGS: Lower chest: Lung bases show mild subpleural reticulation without active infiltrate. Cardiomegaly. There is no pericardial effusion. CABG changes with heavy calcification of the native coronary arteries. There is a small hiatal hernia. Hepatobiliary: Loss of fine detail due to a breathing motion. There is a cirrhotic liver configuration, with capsular nodularity primarily in the left lobe. The  portal vein is normal caliber. No liver masses seen through the breathing motion. There are multiple small layering gallstones again noted. There is no overt wall thickening but there may be pericholecystic edema, versus exaggerated pericholecystic interstitium due to respiratory motion. Ultrasound can be obtained if there is clinical uncertainty as to possible cholecystitis. There is no pericholecystic fluid or biliary dilatation. Pancreas: Partially atrophic. Otherwise unremarkable without contrast. Spleen: No abnormality is seen through the breathing motion. No splenomegaly. Adrenals/Urinary Tract: No right adrenal mass. Chronic calcifications in the lateral right adrenal limb. There is chronic nodular thickening and calcifications of the left adrenal gland, stable. Right kidney again noted partially atrophic. There are Bosniak 1 cysts, in the lower lateral right kidney measuring 1.6 cm and 5.3 Hounsfield units, in the outer mid left kidney measuring 1.6 cm and -1.1 Hounsfield units. No follow-up imaging is  recommended. This was seen previously. There is no other contour deforming abnormality of either unenhanced kidney. Similar perinephric fat stranding. There is no urinary stone or obstruction. There is a bladder base impression by the enlarged prostate. There is mild bladder thickening versus nondistention. Query bladder hypertrophy or cystitis. Favor chronic bladder outlet obstruction. Stomach/Bowel: No dilatation or wall thickening including the appendix. Scattered uncomplicated sigmoid diverticulosis. Vascular/Lymphatic: Moderate patchy aortoiliac and branch vessel atherosclerosis. No AAA. No adenopathy. Reproductive: Enlarged prostate is 5.7 cm transverse, previously 5.5 cm. Other: Umbilical and bilateral inguinal fat hernias, right inguinal fat hernia is larger. No entrapped bowel is seen. No free fluid, free hemorrhage, free air or localizing inflammatory process. Musculoskeletal: Osteopenia and degenerative change lumbar spine with slight dextroscoliosis. Mild hip DJD. Bilateral SI joint bridging osteophytes. No acute or significant osseous findings. IMPRESSION: 1. Cholelithiasis with possible pericholecystic edema versus exaggerated pericholecystic interstitium due to respiratory motion. Ultrasound can be obtained if there is clinical uncertainty as to possible cholecystitis. 2. Cirrhotic liver. No liver masses seen through the breathing motion. No splenomegaly or ascites. 3. Cardiomegaly with CABG changes. 4. Small hiatal hernia. 5. Aortic and coronary artery atherosclerosis. 6. Prostatomegaly with bladder base impression and mild bladder thickening versus nondistention. Query bladder hypertrophy or cystitis. Favor chronic bladder outlet obstruction. 7. Umbilical and bilateral inguinal fat hernias. 8. Diverticulosis without evidence of diverticulitis. 9. Osteopenia and degenerative change. Aortic Atherosclerosis (ICD10-I70.0). Electronically Signed   By: Francis Quam M.D.   On: 02/10/2024 21:27   DG  Chest 2 View Result Date: 02/10/2024 CLINICAL DATA:  Chest pain EXAM: CHEST - 2 VIEW COMPARISON:  Chest x-ray 01/10/2021 FINDINGS: Heart is enlarged. The sternotomy wires are present. There is no focal lung infiltrate, pleural effusion or pneumothorax. No acute fractures are seen. IMPRESSION: Cardiomegaly. No acute cardiopulmonary process. Electronically Signed   By: Greig Pique M.D.   On: 02/10/2024 19:02        Scheduled Meds:  cyanocobalamin   1,000 mcg Oral Once per day on Monday Wednesday Friday   metoprolol  succinate  25 mg Oral Daily   Rivaroxaban   15 mg Oral Q supper   Continuous Infusions:     LOS: 0 days    Time spent: 45 minutes spent on chart review, discussion with nursing staff, consultants, updating family and interview/physical exam; more than 50% of that time was spent in counseling and/or coordination of care.    Harlene RAYMOND Bowl, DO [facility] Available via Epic secure chat 7am-7pm After these hours, please refer to coverage provider listed on [email or web address] 02/12/2024, 1:29 PM

## 2024-02-13 ENCOUNTER — Other Ambulatory Visit: Payer: Self-pay

## 2024-02-13 ENCOUNTER — Other Ambulatory Visit (HOSPITAL_COMMUNITY): Payer: Self-pay

## 2024-02-13 DIAGNOSIS — K819 Cholecystitis, unspecified: Secondary | ICD-10-CM | POA: Diagnosis not present

## 2024-02-13 DIAGNOSIS — K810 Acute cholecystitis: Secondary | ICD-10-CM | POA: Diagnosis not present

## 2024-02-13 DIAGNOSIS — R1011 Right upper quadrant pain: Secondary | ICD-10-CM | POA: Diagnosis not present

## 2024-02-13 DIAGNOSIS — N1832 Chronic kidney disease, stage 3b: Secondary | ICD-10-CM | POA: Diagnosis not present

## 2024-02-13 MED ORDER — PSYLLIUM 95 % PO PACK
1.0000 | PACK | Freq: Every day | ORAL | Status: DC
  Filled 2024-02-13: qty 1

## 2024-02-13 MED ORDER — ONDANSETRON 4 MG PO TBDP
4.0000 mg | ORAL_TABLET | Freq: Three times a day (TID) | ORAL | 0 refills | Status: AC | PRN
  Filled 2024-02-13: qty 20, 8d supply, fill #0

## 2024-02-13 NOTE — Discharge Summary (Signed)
 Physician Discharge Summary  [facility] FMW:[id number] DOB: 1941-05-12 DOA: 02/10/2024  PCP: Charlott Dorn LABOR, MD  Admit date: 02/10/2024 Discharge date: 02/13/2024  Admitted From: Home Disposition:  Home  Recommendations for Outpatient Follow-up:  Follow up with general surgery in 1-2 weeks Please obtain complete metabolic panel in one week   Home Health:No Equipment/Devices:None  Discharge Condition:Stable CODE STATUS:Full Diet recommendation: Heart Healthy   Brief/Interim Summary: 83 y.o. 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   Discharge Diagnoses:  Principal Problem:   Abdominal pain Active Problems:   Essential hypertension   Paroxysmal atrial fibrillation (HCC)   SIRS (systemic inflammatory response syndrome) (HCC)   Elevated troponin   CKD stage 3b, GFR 30-44 ml/min (HCC)   Acute cholecystitis  Right upper quadrant abdominal discomfort: CT scan of the abdomen pelvis s has 1 abdominal ultrasound showed equivocal for acute cholecystitis and left these were down. General surgery was on board recommended a diet and follow-up with them as an outpatient. He was able to tolerate his diet.  Elevated troponins: In the setting of chronic kidney disease, he denies any chest pain or shortness of breath.  Paroxysmal atrial fibrillation: With a chest Vascor 4 continue Xarelto  no changes made.  Cirrhosis: Continue Aldactone .  Essential hypertension: No changes made to his medication.  Chronic kidney disease stage IIIb: Creatinine remained at baseline.  Discharge Instructions  Discharge Instructions     Diet - low sodium heart  healthy   Complete by: As directed    Increase activity slowly   Complete by: As directed       Allergies as of 02/13/2024       Reactions   Lisinopril Other (See Comments)   Other reaction(s): cough        Medication List     TAKE these medications    acetaminophen  500 MG tablet Commonly known as: TYLENOL  Take 500 mg by mouth every 6 (six) hours as needed for mild pain (pain score 1-3) or headache.   allopurinol  100 MG tablet Commonly known as: ZYLOPRIM  Take 1 tablet (100 mg total) by mouth daily.   atorvastatin  40 MG tablet Commonly known as: LIPITOR Take 1 tablet (40 mg total) by mouth at bedtime.   Cholecalciferol  125 MCG (5000 UT) capsule Take 5,000 Units by mouth 3 (three) times a week. Take one capsule (5,000ui) by mouth three times per week.  Take doses on Mon-Weds-Fri.   cyanocobalamin  1000 MCG tablet Commonly known as: VITAMIN B12 Take 1,000 mcg by mouth 3 (three) times a week. Take one tablet (1,029mcg) by mouth three times each week.  Take dose on Tues-Thurs-Sat.   furosemide  20 MG tablet Commonly known as: LASIX  Take 20 mg by mouth daily. Take one tablet (20mg ) by mouth daily. May increase dose to (30mg -40mg ) as needed for pitting edema.   metoprolol  succinate 25 MG 24 hr tablet Commonly known as: TOPROL -XL Take 1 tablet (25 mg total) by mouth daily.   ondansetron  4 MG disintegrating tablet Commonly known as: ZOFRAN -ODT Take 1 tablet (4 mg total) by mouth every 8 (eight) hours as needed for nausea or vomiting.   spironolactone  25 MG tablet Commonly known as: ALDACTONE  Take 0.5 tablets (12.5 mg total) by mouth daily.   valsartan   80 MG tablet Commonly known as: DIOVAN  Take 1 tablet (80 mg total) by mouth daily.   Xarelto  15 MG Tabs tablet Generic drug: Rivaroxaban  Take 1 tablet (15 mg total) by mouth daily with supper.        Allergies  Allergen Reactions   Lisinopril Other (See Comments)    Other reaction(s): cough     Consultations: Gastroenterology   Procedures/Studies: US  Abdomen Limited RUQ (LIVER/GB) Result Date: 02/10/2024 CLINICAL DATA:  Right upper quadrant pain EXAM: ULTRASOUND ABDOMEN LIMITED RIGHT UPPER QUADRANT COMPARISON:  CT abdomen and pelvis 02/10/2024 FINDINGS: Gallbladder: Multiple gallstones are present measuring up to 1.4 cm. There is gallbladder wall thickening measuring up to 4.5 mm. Gallbladder sludge is also present. There is no pericholecystic fluid. Sonographic Beverley sign is negative. Common bile duct: Diameter: 5.7 mm Liver: No focal lesion identified. Lobular contour. Mild increase in parenchymal echogenicity. Portal vein is patent on color Doppler imaging with normal direction of blood flow towards the liver. Other: None. IMPRESSION: 1. Cholelithiasis with gallbladder wall thickening. Sonographic Beverley sign is negative. Findings are equivocal for acute cholecystitis. 2. Lobular contour of the liver with mild increase in parenchymal echogenicity. Findings are nonspecific but can be seen in the setting of cirrhosis. Electronically Signed   By: Greig Pique M.D.   On: 02/10/2024 22:52   CT ABDOMEN PELVIS WO CONTRAST Result Date: 02/10/2024 CLINICAL DATA:  Abdominal pain, acute, nonlocalized. EXAM: CT ABDOMEN AND PELVIS WITHOUT CONTRAST TECHNIQUE: Multidetector CT imaging of the abdomen and pelvis was performed following the standard protocol without IV contrast. RADIATION DOSE REDUCTION: This exam was performed according to the departmental dose-optimization program which includes automated exposure control, adjustment of the mA and/or kV according to patient size and/or use of iterative reconstruction technique. COMPARISON:  CT abdomen and pelvis without contrast 10/21/2021. FINDINGS: Lower chest: Lung bases show mild subpleural reticulation without active infiltrate. Cardiomegaly. There is no pericardial effusion. CABG changes with heavy calcification of the native coronary arteries.  There is a small hiatal hernia. Hepatobiliary: Loss of fine detail due to a breathing motion. There is a cirrhotic liver configuration, with capsular nodularity primarily in the left lobe. The portal vein is normal caliber. No liver masses seen through the breathing motion. There are multiple small layering gallstones again noted. There is no overt wall thickening but there may be pericholecystic edema, versus exaggerated pericholecystic interstitium due to respiratory motion. Ultrasound can be obtained if there is clinical uncertainty as to possible cholecystitis. There is no pericholecystic fluid or biliary dilatation. Pancreas: Partially atrophic. Otherwise unremarkable without contrast. Spleen: No abnormality is seen through the breathing motion. No splenomegaly. Adrenals/Urinary Tract: No right adrenal mass. Chronic calcifications in the lateral right adrenal limb. There is chronic nodular thickening and calcifications of the left adrenal gland, stable. Right kidney again noted partially atrophic. There are Bosniak 1 cysts, in the lower lateral right kidney measuring 1.6 cm and 5.3 Hounsfield units, in the outer mid left kidney measuring 1.6 cm and -1.1 Hounsfield units. No follow-up imaging is recommended. This was seen previously. There is no other contour deforming abnormality of either unenhanced kidney. Similar perinephric fat stranding. There is no urinary stone or obstruction. There is a bladder base impression by the enlarged prostate. There is mild bladder thickening versus nondistention. Query bladder hypertrophy or cystitis. Favor chronic bladder outlet obstruction. Stomach/Bowel: No dilatation or wall thickening including the appendix. Scattered uncomplicated sigmoid diverticulosis. Vascular/Lymphatic: Moderate patchy aortoiliac and branch vessel atherosclerosis. No AAA. No adenopathy. Reproductive:  Enlarged prostate is 5.7 cm transverse, previously 5.5 cm. Other: Umbilical and bilateral inguinal  fat hernias, right inguinal fat hernia is larger. No entrapped bowel is seen. No free fluid, free hemorrhage, free air or localizing inflammatory process. Musculoskeletal: Osteopenia and degenerative change lumbar spine with slight dextroscoliosis. Mild hip DJD. Bilateral SI joint bridging osteophytes. No acute or significant osseous findings. IMPRESSION: 1. Cholelithiasis with possible pericholecystic edema versus exaggerated pericholecystic interstitium due to respiratory motion. Ultrasound can be obtained if there is clinical uncertainty as to possible cholecystitis. 2. Cirrhotic liver. No liver masses seen through the breathing motion. No splenomegaly or ascites. 3. Cardiomegaly with CABG changes. 4. Small hiatal hernia. 5. Aortic and coronary artery atherosclerosis. 6. Prostatomegaly with bladder base impression and mild bladder thickening versus nondistention. Query bladder hypertrophy or cystitis. Favor chronic bladder outlet obstruction. 7. Umbilical and bilateral inguinal fat hernias. 8. Diverticulosis without evidence of diverticulitis. 9. Osteopenia and degenerative change. Aortic Atherosclerosis (ICD10-I70.0). Electronically Signed   By: Francis Quam M.D.   On: 02/10/2024 21:27   DG Chest 2 View Result Date: 02/10/2024 CLINICAL DATA:  Chest pain EXAM: CHEST - 2 VIEW COMPARISON:  Chest x-ray 01/10/2021 FINDINGS: Heart is enlarged. The sternotomy wires are present. There is no focal lung infiltrate, pleural effusion or pneumothorax. No acute fractures are seen. IMPRESSION: Cardiomegaly. No acute cardiopulmonary process. Electronically Signed   By: Greig Pique M.D.   On: 02/10/2024 19:02   (Echo, Carotid, EGD, Colonoscopy, ERCP)    Subjective: No complaints  Discharge Exam: Vitals:   02/12/24 2101 02/13/24 0515  BP: (!) 121/59 113/68  Pulse: 65 (!) 59  Resp: 18 18  Temp: 98.5 F (36.9 C) 98 F (36.7 C)  SpO2: 93% 96%   Vitals:   02/12/24 0850 02/12/24 1745 02/12/24 2101 02/13/24  0515  BP: (!) 111/56 122/74 (!) 121/59 113/68  Pulse: 73 81 65 (!) 59  Resp: 18 19 18 18   Temp: 98.3 F (36.8 C) 98.3 F (36.8 C) 98.5 F (36.9 C) 98 F (36.7 C)  TempSrc:      SpO2: 99% 98% 93% 96%  Weight:      Height:        General: Pt is alert, awake, not in acute distress Cardiovascular: RRR, S1/S2 +, no rubs, no gallops Respiratory: CTA bilaterally, no wheezing, no rhonchi Abdominal: Soft, NT, ND, bowel sounds + Extremities: no edema, no cyanosis    The results of significant diagnostics from this hospitalization (including imaging, microbiology, ancillary and laboratory) are listed below for reference.     Microbiology: No results found for this or any previous visit (from the past 240 hours).   Labs: BNP (last 3 results) No results for input(s): BNP in the last 8760 hours. Basic Metabolic Panel: Recent Labs  Lab 02/10/24 1838 02/10/24 2106 02/11/24 0500 02/12/24 0458  NA 136  --  136 136  K 4.0  --  4.3 3.9  CL 102  --  105 105  CO2 21*  --  20* 20*  GLUCOSE 166*  --  164* 131*  BUN 24*  --  26* 28*  CREATININE 2.11*  --  2.00* 1.97*  CALCIUM  9.5  --  8.9 8.4*  MG  --  1.9 1.7  --    Liver Function Tests: Recent Labs  Lab 02/10/24 1838 02/11/24 0500 02/12/24 0458  AST 34 29 27  ALT 22 18 16   ALKPHOS 97 77 66  BILITOT 1.9* 1.2 1.4*  PROT 7.3 6.1* 5.5*  ALBUMIN 3.3* 2.7* 2.4*   Recent Labs  Lab 02/10/24 1838  LIPASE 40   No results for input(s): AMMONIA in the last 168 hours. CBC: Recent Labs  Lab 02/10/24 1838 02/11/24 0500 02/12/24 0458  WBC 13.6* 15.8* 13.1*  NEUTROABS  --  11.5*  --   HGB 13.7 12.7* 12.0*  HCT 43.8 39.9 37.3*  MCV 91.6 90.1 90.1  PLT 192 159 149*   Cardiac Enzymes: No results for input(s): CKTOTAL, CKMB, CKMBINDEX, TROPONINI in the last 168 hours. BNP: Invalid input(s): POCBNP CBG: No results for input(s): GLUCAP in the last 168 hours. D-Dimer No results for input(s): DDIMER in the  last 72 hours. Hgb A1c No results for input(s): HGBA1C in the last 72 hours. Lipid Profile No results for input(s): CHOL, HDL, LDLCALC, TRIG, CHOLHDL, LDLDIRECT in the last 72 hours. Thyroid  function studies No results for input(s): TSH, T4TOTAL, T3FREE, THYROIDAB in the last 72 hours.  Invalid input(s): FREET3 Anemia work up No results for input(s): VITAMINB12, FOLATE, FERRITIN, TIBC, IRON, RETICCTPCT in the last 72 hours. Urinalysis    Component Value Date/Time   COLORURINE YELLOW 02/10/2024 2036   APPEARANCEUR CLEAR 02/10/2024 2036   LABSPEC 1.015 02/10/2024 2036   PHURINE 5.0 02/10/2024 2036   GLUCOSEU NEGATIVE 02/10/2024 2036   HGBUR NEGATIVE 02/10/2024 2036   BILIRUBINUR NEGATIVE 02/10/2024 2036   KETONESUR NEGATIVE 02/10/2024 2036   PROTEINUR NEGATIVE 02/10/2024 2036   UROBILINOGEN 1.0 03/09/2011 0849   NITRITE NEGATIVE 02/10/2024 2036   LEUKOCYTESUR NEGATIVE 02/10/2024 2036   Sepsis Labs Recent Labs  Lab 02/10/24 1838 02/11/24 0500 02/12/24 0458  WBC 13.6* 15.8* 13.1*   Microbiology No results found for this or any previous visit (from the past 240 hours).   Time coordinating discharge: Over 35 minutes  SIGNED:   Erle Odell Castor, MD  [facility] 02/13/2024, 9:06 AM Pager   If 7PM-7AM, please contact night-coverage [email or web address] Password [email or web address]

## 2024-02-13 NOTE — TOC Transition Note (Signed)
 Transition of Care [facility]) - Discharge Note   Patient Details  Name: Shane Espinoza MRN: [id number] Date of Birth: [birth year]  Transition of Care [facility]) CM/SW Contact:  Tom-Johnson, Harvest Muskrat, RN Phone Number: 02/13/2024, 9:33 AM   Clinical Narrative:     Patient is scheduled for discharge today.  Outpatient f/u, hospital f/u and discharge instructions on AVS. Prescriptions sent to [facility] and patient will pickup at discharge. No TOC needs or recommendations noted. Wife, [name] to transport at discharge.  No further TOC needs noted.         Final next level of care: Home/Self Care Barriers to Discharge: Barriers Resolved   Patient Goals and CMS Choice Patient states their goals for this hospitalization and ongoing recovery are:: To return home CMS Medicare.gov Compare Post Acute Care list provided to:: Patient Choice offered to / list presented to : NA      Discharge Placement                Patient to be transferred to facility by: Wife Name of family member notified: [name]    Discharge Plan and Services Additional resources added to the After Visit Summary for                  DME Arranged: N/A DME Agency: NA       HH Arranged: NA HH Agency: NA        Social Drivers of Health (SDOH) Interventions SDOH Screenings   Food Insecurity: No Food Insecurity (02/11/2024)  Housing: Low Risk  (02/11/2024)  Transportation Needs: No Transportation Needs (02/11/2024)  Utilities: Not At Risk (02/11/2024)  Depression (PHQ2-9): Low Risk  (12/07/2020)  Social Connections: Unknown (02/11/2024)  Tobacco Use: Low Risk  (02/11/2024)     Readmission Risk Interventions     No data to display

## 2024-02-19 DIAGNOSIS — K810 Acute cholecystitis: Secondary | ICD-10-CM | POA: Diagnosis not present

## 2024-02-20 DIAGNOSIS — Z8673 Personal history of transient ischemic attack (TIA), and cerebral infarction without residual deficits: Secondary | ICD-10-CM | POA: Diagnosis not present

## 2024-02-20 DIAGNOSIS — K7460 Unspecified cirrhosis of liver: Secondary | ICD-10-CM | POA: Diagnosis not present

## 2024-02-20 DIAGNOSIS — R197 Diarrhea, unspecified: Secondary | ICD-10-CM | POA: Diagnosis not present

## 2024-02-20 DIAGNOSIS — R739 Hyperglycemia, unspecified: Secondary | ICD-10-CM | POA: Diagnosis not present

## 2024-02-20 DIAGNOSIS — E1122 Type 2 diabetes mellitus with diabetic chronic kidney disease: Secondary | ICD-10-CM | POA: Diagnosis not present

## 2024-02-20 DIAGNOSIS — K819 Cholecystitis, unspecified: Secondary | ICD-10-CM | POA: Diagnosis not present

## 2024-02-20 DIAGNOSIS — Z79899 Other long term (current) drug therapy: Secondary | ICD-10-CM | POA: Diagnosis not present

## 2024-02-20 DIAGNOSIS — I4819 Other persistent atrial fibrillation: Secondary | ICD-10-CM | POA: Diagnosis not present

## 2024-02-20 DIAGNOSIS — I5042 Chronic combined systolic (congestive) and diastolic (congestive) heart failure: Secondary | ICD-10-CM | POA: Diagnosis not present

## 2024-02-20 DIAGNOSIS — I10 Essential (primary) hypertension: Secondary | ICD-10-CM | POA: Diagnosis not present

## 2024-02-20 DIAGNOSIS — N1832 Chronic kidney disease, stage 3b: Secondary | ICD-10-CM | POA: Diagnosis not present

## 2024-02-20 DIAGNOSIS — D6869 Other thrombophilia: Secondary | ICD-10-CM | POA: Diagnosis not present

## 2024-02-25 DIAGNOSIS — R197 Diarrhea, unspecified: Secondary | ICD-10-CM | POA: Diagnosis not present

## 2024-02-28 DIAGNOSIS — R748 Abnormal levels of other serum enzymes: Secondary | ICD-10-CM | POA: Diagnosis not present

## 2024-03-10 ENCOUNTER — Ambulatory Visit: Attending: Cardiovascular Disease | Admitting: Cardiovascular Disease

## 2024-03-10 VITALS — BP 120/54 | HR 58 | Ht 68.5 in | Wt 201.9 lb

## 2024-03-10 DIAGNOSIS — D6869 Other thrombophilia: Secondary | ICD-10-CM | POA: Diagnosis not present

## 2024-03-10 DIAGNOSIS — I5032 Chronic diastolic (congestive) heart failure: Secondary | ICD-10-CM | POA: Diagnosis not present

## 2024-03-10 DIAGNOSIS — G4733 Obstructive sleep apnea (adult) (pediatric): Secondary | ICD-10-CM | POA: Diagnosis not present

## 2024-03-10 DIAGNOSIS — N183 Chronic kidney disease, stage 3 unspecified: Secondary | ICD-10-CM | POA: Diagnosis not present

## 2024-03-10 DIAGNOSIS — I4821 Permanent atrial fibrillation: Secondary | ICD-10-CM | POA: Diagnosis not present

## 2024-03-10 DIAGNOSIS — I25118 Atherosclerotic heart disease of native coronary artery with other forms of angina pectoris: Secondary | ICD-10-CM | POA: Diagnosis not present

## 2024-03-10 DIAGNOSIS — E1122 Type 2 diabetes mellitus with diabetic chronic kidney disease: Secondary | ICD-10-CM

## 2024-03-10 DIAGNOSIS — N1832 Chronic kidney disease, stage 3b: Secondary | ICD-10-CM | POA: Diagnosis not present

## 2024-03-10 DIAGNOSIS — E785 Hyperlipidemia, unspecified: Secondary | ICD-10-CM

## 2024-03-10 MED ORDER — FUROSEMIDE 20 MG PO TABS: ORAL_TABLET | ORAL | 3 refills | Status: DC

## 2024-03-10 MED ORDER — FUROSEMIDE 20 MG PO TABS: ORAL_TABLET | ORAL | Status: AC

## 2024-03-10 NOTE — Patient Instructions (Signed)
 Medication Instructions:  Decrease Lasix  to 20 mg on Tuesday, Thursday, Saturday, and Sunday *If you need a refill on your cardiac medications before your next appointment, please call your pharmacy*  Lab Work: None ordered If you have labs (blood work) drawn today and your tests are completely normal, you will receive your results only by: MyChart Message (if you have MyChart) OR A paper copy in the mail If you have any lab test that is abnormal or we need to change your treatment, we will call you to review the results.  Testing/Procedures: None ordered  Follow-Up: At [facility], you and your health needs are our priority.  As part of our continuing mission to provide you with exceptional heart care, our providers are all part of one team.  This team includes your primary Cardiologist (physician) and Advanced Practice Providers or APPs (Physician Assistants and Nurse Practitioners) who all work together to provide you with the care you need, when you need it.  Your next appointment:   1 year(s)  Provider:   Dr Francyne   We recommend signing up for the patient portal called MyChart.  Sign up information is provided on this After Visit Summary.  MyChart is used to connect with patients for Virtual Visits (Telemedicine).  Patients are able to view lab/test results, encounter notes, upcoming appointments, etc.  Non-urgent messages can be sent to your provider as well.   To learn more about what you can do with MyChart, go to [email or web address].

## 2024-03-11 DIAGNOSIS — I4821 Permanent atrial fibrillation: Secondary | ICD-10-CM | POA: Insufficient documentation

## 2024-03-11 DIAGNOSIS — I5032 Chronic diastolic (congestive) heart failure: Secondary | ICD-10-CM | POA: Insufficient documentation

## 2024-03-11 DIAGNOSIS — G4733 Obstructive sleep apnea (adult) (pediatric): Secondary | ICD-10-CM | POA: Insufficient documentation

## 2024-03-11 NOTE — Progress Notes (Signed)
 Cardiology Office Note:    Date:  03/11/2024   ID:  Shane Espinoza, DOB [birth year], MRN [id number]  PCP:  Shane Dorn LABOR, MD    HeartCare Providers Cardiologist:  Debby Sor, MD (Inactive)     Referring MD: Shane Espinoza, *   Chief Complaint  Patient presents with   Coronary Artery Disease       Transition care from Dr. Debby Jain  History of Present Illness:    Shane Espinoza is a 83 y.o. male with a hx of CAD s/p CABG 2012, LIMA-LAD, SVG-OM, SVG-RV marginal, SVG-PDA), chronic HFpEF, permanent atrial fibrillation, history of previous stroke (presumed embolic 2015; recurrent stroke presumed small vessel disease 2022), on chronic anticoagulation, HTN, bilateral severe knee arthritis, type II diabetes, dyslipidemia, history of C. difficile colitis 2021, OSA on CPAP ([facility]), CKD stage IIIb (Dr. Dolan), history of gout and hyperuricemia.  He was recently hospitalized with biliary colic, without clear evidence for acute cholecystitis.  He was managed conservatively with antibiotics, but unfortunately developed C. difficile related diarrhea, which in turn has resolved following treatment with oral vancomycin which she has now completed.  Imaging studies performed during that hospitalization suggested possible cirrhosis based on lobular contour of the liver, but there was no indirect evidence of portal hypertension.  Also noted umbilical and bilateral inguinal hernias with fat content and a small hiatal hernia.  Has not had problems with chest pain or shortness of breath.  He occasionally has weakness/dizziness and low blood pressure and drinks Gatorade, but also continues to take loop diuretics on a daily basis.  Current weight of 201 pounds seems to be close to dry weight (today on his home scale he weighed 196 pounds, 5 pounds less than our office scale).  He denies lower extremity edema, orthopnea, PND and has not had any problems with chest pain  in many years.  He has not experienced syncope or recent falls.  He walks slowly with a walker due to his degenerative joint disease involving knees and ankle.  He has not had any bleeding problems on chronic anticoagulation with Xarelto , dose adjusted for renal dysfunction.  He has not had any recent neurological events.  His last functional status was a normal perfusion study in 2018 and last assessment of left ventricular systolic function by echo showed EF of 55-60% in 2022.  There was moderate MR and severely dilated left atrium.  Most recent renal labs with a creatinine of 1.79 (calculated GFR 37) was an improvement compared to the creatinine of 1.97 during his hospitalization.  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   [name]    Past Surgical History:  Procedure Laterality Date   CARDIAC CATHETERIZATION  03/02/2011   Recommended CABG   COLON SURGERY     CORONARY ARTERY BYPASS GRAFT  03/07/2011   DR.VAN  TRIGHT   EYE SURGERY     LEXISCAN  MYOVIEW   02/20/2012   EKG negative for ischemia, noraml study   TRANSTHORACIC ECHOCARDIOGRAM  02/20/2012   EF 50-55%, mild-moderate concentric LVH, LA moderately dilated, moderate mitral regurg   VASCULAR DOPPLER  03/03/2011   Nosignificant extracranial carotid artery stenosis    Current Medications: Current Meds  Medication Sig   acetaminophen  (  TYLENOL ) 500 MG tablet Take 500 mg by mouth every 6 (six) hours as needed for mild pain (pain score 1-3) or headache.   allopurinol  (ZYLOPRIM ) 100 MG tablet Take 1 tablet (100 mg total) by mouth daily.   atorvastatin  (LIPITOR) 40 MG tablet Take 1 tablet (40 mg total) by mouth at bedtime.   Cholecalciferol  125 MCG (5000 UT) capsule Take 5,000 Units by mouth 3 (three) times a week. Take one capsule (5,000ui) by mouth three times per  week.  Take doses on Mon-Weds-Fri.   loperamide (IMODIUM A-D) 2 MG tablet Take 2 mg by mouth as needed for diarrhea or loose stools.   metoprolol  succinate (TOPROL -XL) 25 MG 24 hr tablet Take 1 tablet (25 mg total) by mouth daily.   ondansetron  (ZOFRAN -ODT) 4 MG disintegrating tablet Take 1 tablet (4 mg total) by mouth every 8 (eight) hours as needed for nausea or vomiting.   Rivaroxaban  (XARELTO ) 15 MG TABS tablet Take 1 tablet (15 mg total) by mouth daily with supper.   spironolactone  (ALDACTONE ) 25 MG tablet Take 0.5 tablets (12.5 mg total) by mouth daily.   valsartan  (DIOVAN ) 80 MG tablet Take 1 tablet (80 mg total) by mouth daily.   vitamin B-12 (CYANOCOBALAMIN ) 1000 MCG tablet Take 1,000 mcg by mouth 3 (three) times a week. Take one tablet (1,072mcg) by mouth three times each week.  Take dose on Tues-Thurs-Sat.   [DISCONTINUED] furosemide  (LASIX ) 20 MG tablet Take 20 mg by mouth daily. Take one tablet (20mg ) by mouth daily. May increase dose to (30mg -40mg ) as needed for pitting edema.     Allergies:   Lisinopril     Family History: The patient's family history includes CVA in his father; Heart attack in his maternal grandfather and maternal grandmother; Heart murmur in his sister; Hypertension in his son.  ROS:   Please see the history of present illness.     All other systems reviewed and are negative.  EKGs/Labs/Other Studies Reviewed:    The following studies were reviewed today: Echo 2022   1. Left ventricular ejection fraction, by estimation, is 55 to 60%. The  left ventricle has normal function. The left ventricle has no regional  wall motion abnormalities. Left ventricular diastolic function could not  be evaluated.   2. Right ventricular systolic function is normal. The right ventricular  size is mildly enlarged. Tricuspid regurgitation signal is inadequate for  assessing PA pressure.   3. Left atrial size was severely dilated.   4. Right atrial size was mildly  dilated.   5. The mitral valve is abnormal. Moderate mitral valve regurgitation. No  evidence of mitral stenosis.   6. The aortic valve is tricuspid. Aortic valve regurgitation is not  visualized. Mild aortic valve sclerosis is present, with no evidence of  aortic valve stenosis.   7. Aortic dilatation noted. There is borderline dilatation of the  ascending aorta, measuring 39 mm.   8. The inferior vena cava is normal in size with greater than 50%  respiratory variability, suggesting right atrial pressure of 3 mmHg.   Comparison(s): 08/06/20 EF 55-60%.       Recent Labs: 02/11/2024: Magnesium 1.7 02/12/2024: ALT 16; BUN 28; Creatinine, Ser 1.97; Hemoglobin 12.0; Platelets 149; Potassium 3.9; Sodium 136  Recent Lipid Panel    Component Value Date/Time   CHOL 130 02/22/2021 1028   TRIG 121 02/22/2021 1028   HDL 43 02/22/2021 1028   CHOLHDL 3.0 02/22/2021 1028   CHOLHDL 2.8 08/06/2020 0604   VLDL 20  08/06/2020 0604   LDLCALC 65 02/22/2021 1028     Risk Assessment/Calculations:    CHA2DS2-VASc Score = 7   This indicates a 11.2% annual risk of stroke. The patient's score is based upon: CHF History: 1 HTN History: 1 Diabetes History: 0 Stroke History: 2 Vascular Disease History: 1 Age Score: 2 Gender Score: 0               Physical Exam:    VS:  BP (!) 120/54 (BP Location: Left Arm, Patient Position: Sitting, Cuff Size: Normal)   Pulse (!) 58   Ht 5' 8.5 (1.74 m)   Wt 201 lb 14.4 oz (91.6 kg)   SpO2 97%   BMI 30.25 kg/m     Wt Readings from Last 3 Encounters:  03/10/24 201 lb 14.4 oz (91.6 kg)  02/11/24 209 lb 12.8 oz (95.2 kg)  09/11/23 214 lb (97.1 kg)     GEN:  Well nourished, well developed in no acute distress HEENT: Normal NECK: No JVD; No carotid bruits LYMPHATICS: No lymphadenopathy CARDIAC: irregular, 1/6 aortic ejection murmur, no diastolic murmurs, rubs, gallops RESPIRATORY:  Clear to auscultation without rales, wheezing or rhonchi  ABDOMEN:  Soft, non-tender, non-distended MUSCULOSKELETAL:  No edema; No deformity  SKIN: Warm and dry NEUROLOGIC:  Alert and oriented x 3 PSYCHIATRIC:  Normal affect   ASSESSMENT:    No diagnosis found. PLAN:    In order of problems listed above:  Chronic HFpEF: Appears clinically euvolemic, but frequently needs to rehydrate with Gatorade when he feels weak and has low blood pressure.  Rather than adding a sodium rich beverage probably makes more sense to reduce his dose of diuretic.  Will change furosemide  to 20 mg 4 days a week.  Seems that his dry weight is around 195-`196 pounds on his home scale, 5 pounds higher on our office scale. CAD s/p CABG: Angina free on beta blockers.  Not on aspirin  due to full anticoagulation.  On statin. AFib: Permanent arrhythmia.  Will rate controlled on a relatively low dose of beta-blocker.  Appropriately on anticoagulation, but consider switching to Eliquis if renal function deteriorates further. Anticoagulation: Xarelto  dose adjusted for GFR 37. DM2: Well-controlled, recent hemoglobin A1c 7.1%. HLP: All lipid parameters are within target range with the exception of chronically low HDL cholesterol.  Continue statin. CKD3B: Creatinine has been a little variable in the last few weeks, with a GFR in the 30-40 range. OSA: Followed by [facility] medicine team at [facility]  Compliant with CPAP, denies daytime hypersomnolence. Liver abnormalities: Nodular liver configuration suggesting cirrhosis, but without any evidence of portal hypertension, varices or splenomegaly.  Liver transaminases were normal as was the bilirubin.  The only significant abnormality is a slight reduction in albumin to 3.3 (2.4 by the time of hospital discharge), but this was in the setting of acute illness.           Medication Adjustments/Labs and Tests Ordered: Current medicines are reviewed at length with the patient today.  Concerns regarding medicines are outlined above.  No orders of the  defined types were placed in this encounter.  Meds ordered this encounter  Medications   DISCONTD: furosemide  (LASIX ) 20 MG tablet    Sig: Take 20 mg every Tuesday, Thursday, Saturday, and Sunday    Dispense:  75 tablet    Refill:  3   furosemide  (LASIX ) 20 MG tablet    Sig: Take 20 mg every Tuesday, Thursday, Saturday, and Sunday    Patient Instructions  Medication Instructions:  Decrease Lasix  to 20 mg on Tuesday, Thursday, Saturday, and Sunday *If you need a refill on your cardiac medications before your next appointment, please call your pharmacy*  Lab Work: None ordered If you have labs (blood work) drawn today and your tests are completely normal, you will receive your results only by: MyChart Message (if you have MyChart) OR A paper copy in the mail If you have any lab test that is abnormal or we need to change your treatment, we will call you to review the results.  Testing/Procedures: None ordered  Follow-Up: At [facility], you and your health needs are our priority.  As part of our continuing mission to provide you with exceptional heart care, our providers are all part of one team.  This team includes your primary Cardiologist (physician) and Advanced Practice Providers or APPs (Physician Assistants and Nurse Practitioners) who all work together to provide you with the care you need, when you need it.  Your next appointment:   1 year(s)  Provider:   Dr Francyne   We recommend signing up for the patient portal called MyChart.  Sign up information is provided on this After Visit Summary.  MyChart is used to connect with patients for Virtual Visits (Telemedicine).  Patients are able to view lab/test results, encounter notes, upcoming appointments, etc.  Non-urgent messages can be sent to your provider as well.   To learn more about what you can do with MyChart, go to [email or web address].          Signed, Jerel Francyne, MD  03/11/2024 10:13 AM     [facility]

## 2024-03-13 DIAGNOSIS — C44622 Squamous cell carcinoma of skin of right upper limb, including shoulder: Secondary | ICD-10-CM | POA: Diagnosis not present

## 2024-03-13 DIAGNOSIS — L570 Actinic keratosis: Secondary | ICD-10-CM | POA: Diagnosis not present

## 2024-03-13 DIAGNOSIS — L308 Other specified dermatitis: Secondary | ICD-10-CM | POA: Diagnosis not present

## 2024-03-13 DIAGNOSIS — Z85828 Personal history of other malignant neoplasm of skin: Secondary | ICD-10-CM | POA: Diagnosis not present

## 2024-03-20 DIAGNOSIS — C44622 Squamous cell carcinoma of skin of right upper limb, including shoulder: Secondary | ICD-10-CM | POA: Diagnosis not present

## 2024-03-20 DIAGNOSIS — Z85828 Personal history of other malignant neoplasm of skin: Secondary | ICD-10-CM | POA: Diagnosis not present

## 2024-03-22 ENCOUNTER — Telehealth: Payer: Self-pay

## 2024-03-22 NOTE — Telephone Encounter (Signed)
   Received page about needing further instruction on medication instructions  Patient recently seen by Dr. Francyne on 03/10/24 outpatient. Patient is in permanent atrial fibrillation with a history of strokes.    Had MOHS procedure on 9/18, held xarelto  that evening. Then on Friday 9/19 patient re-started his xarelto  and approx 2 hours later had bleeding from surgical site. Patient had to go into office in order for the bleeding to stop. Currently not bleeding. Patient and wife calling to see if he should take his xarelto  this evening as the dermatologist informed the patient it would be our decision when to restart the medications.   I advised patient if he is not actively bleeding to take his medication this evening has he is high risk for stroke. Patient and wife were advised if he starts bleeding to call dermatologist and if unable to get in contact with them to go to the ED.   Caller verbalized understanding and was grateful for the call back.  Shane LOISE Salen, PA-C 03/22/2024, 3:09 PM

## 2024-03-25 DIAGNOSIS — G4733 Obstructive sleep apnea (adult) (pediatric): Secondary | ICD-10-CM | POA: Diagnosis not present

## 2024-03-27 DIAGNOSIS — R197 Diarrhea, unspecified: Secondary | ICD-10-CM | POA: Diagnosis not present

## 2024-04-01 DIAGNOSIS — N1832 Chronic kidney disease, stage 3b: Secondary | ICD-10-CM | POA: Diagnosis not present

## 2024-04-07 DIAGNOSIS — E559 Vitamin D deficiency, unspecified: Secondary | ICD-10-CM | POA: Diagnosis not present

## 2024-04-07 DIAGNOSIS — N1832 Chronic kidney disease, stage 3b: Secondary | ICD-10-CM | POA: Diagnosis not present

## 2024-04-07 DIAGNOSIS — I129 Hypertensive chronic kidney disease with stage 1 through stage 4 chronic kidney disease, or unspecified chronic kidney disease: Secondary | ICD-10-CM | POA: Diagnosis not present

## 2024-04-07 DIAGNOSIS — N320 Bladder-neck obstruction: Secondary | ICD-10-CM | POA: Diagnosis not present

## 2024-04-07 DIAGNOSIS — A0472 Enterocolitis due to Clostridium difficile, not specified as recurrent: Secondary | ICD-10-CM | POA: Diagnosis not present

## 2024-04-07 DIAGNOSIS — M1000 Idiopathic gout, unspecified site: Secondary | ICD-10-CM | POA: Diagnosis not present

## 2024-04-07 DIAGNOSIS — I509 Heart failure, unspecified: Secondary | ICD-10-CM | POA: Diagnosis not present

## 2024-04-21 ENCOUNTER — Other Ambulatory Visit (HOSPITAL_COMMUNITY): Payer: Self-pay

## 2024-04-23 ENCOUNTER — Other Ambulatory Visit (HOSPITAL_COMMUNITY): Payer: Self-pay

## 2024-05-05 ENCOUNTER — Telehealth: Payer: Self-pay | Admitting: Cardiovascular Disease

## 2024-05-05 ENCOUNTER — Other Ambulatory Visit (HOSPITAL_COMMUNITY): Payer: Self-pay

## 2024-05-05 ENCOUNTER — Other Ambulatory Visit: Payer: Self-pay

## 2024-05-05 MED ORDER — RIVAROXABAN 15 MG PO TABS: 15.0000 mg | ORAL_TABLET | Freq: Every day | ORAL | 1 refills | Status: AC

## 2024-05-05 NOTE — Telephone Encounter (Signed)
*  STAT* If patient is at the pharmacy, call can be transferred to refill team.   1. Which medications need to be refilled? (please list name of each medication and dose if known)   Rivaroxaban  (XARELTO ) 15 MG TABS tablet    2. Which pharmacy/location (including street and city if local pharmacy) is medication to be sent to? [id number] - [place],  - [address] AT [facility] [address] Phone: [phone]  Fax: [phone]     3. Do they need a 30 day or 90 day supply? 90

## 2024-05-05 NOTE — Telephone Encounter (Signed)
 Prescription refill request for Xarelto  received.  Indication: a fib Last office visit: 03/10/24 Weight: 201# Age: 83 Scr: 1.79 02/20/24 PCP CrCl: 40 ml/min

## 2024-06-02 ENCOUNTER — Encounter (INDEPENDENT_AMBULATORY_CARE_PROVIDER_SITE_OTHER): Payer: Medicare HMO | Admitting: Ophthalmology

## 2024-06-02 DIAGNOSIS — H35341 Macular cyst, hole, or pseudohole, right eye: Secondary | ICD-10-CM | POA: Diagnosis not present

## 2024-06-02 DIAGNOSIS — H35372 Puckering of macula, left eye: Secondary | ICD-10-CM

## 2024-06-02 DIAGNOSIS — H43812 Vitreous degeneration, left eye: Secondary | ICD-10-CM

## 2024-06-02 DIAGNOSIS — H35033 Hypertensive retinopathy, bilateral: Secondary | ICD-10-CM

## 2024-06-02 DIAGNOSIS — I10 Essential (primary) hypertension: Secondary | ICD-10-CM

## 2024-06-12 DIAGNOSIS — G4733 Obstructive sleep apnea (adult) (pediatric): Secondary | ICD-10-CM | POA: Diagnosis not present

## 2024-06-13 NOTE — Progress Notes (Addendum)
 Shane Espinoza                                          MRN: [id number]   07/11/2024   The VBCI Quality Team Specialist reviewed this patient medical record for the purposes of chart review for care gap closure. The following were reviewed: chart review for care gap closure-kidney health evaluation for diabetes:eGFR  and uACR.    VBCI Quality Team

## 2024-07-15 ENCOUNTER — Other Ambulatory Visit: Payer: Self-pay | Admitting: Cardiovascular Disease

## 2024-07-15 MED ORDER — ATORVASTATIN CALCIUM 40 MG PO TABS: 40.0000 mg | ORAL_TABLET | Freq: Every day | ORAL | 2 refills | Status: AC

## 2025-06-02 ENCOUNTER — Encounter (INDEPENDENT_AMBULATORY_CARE_PROVIDER_SITE_OTHER): Admitting: Ophthalmology
